# Patient Record
Sex: Female | Born: 1955
Health system: Southern US, Community
[De-identification: ages and names within clinical notes are randomized; demographics above are authoritative.]

## PROBLEM LIST (undated history)

## (undated) DIAGNOSIS — C801 Malignant (primary) neoplasm, unspecified: Secondary | ICD-10-CM

## (undated) DIAGNOSIS — K56609 Unspecified intestinal obstruction, unspecified as to partial versus complete obstruction: Secondary | ICD-10-CM

## (undated) DIAGNOSIS — K5790 Diverticulosis of intestine, part unspecified, without perforation or abscess without bleeding: Secondary | ICD-10-CM

## (undated) DIAGNOSIS — K760 Fatty (change of) liver, not elsewhere classified: Secondary | ICD-10-CM

## (undated) DIAGNOSIS — R740 Nonspecific elevation of levels of transaminase and lactic acid dehydrogenase [LDH]: Secondary | ICD-10-CM

## (undated) DIAGNOSIS — D259 Leiomyoma of uterus, unspecified: Secondary | ICD-10-CM

## (undated) DIAGNOSIS — Z87442 Personal history of urinary calculi: Secondary | ICD-10-CM

## (undated) DIAGNOSIS — E876 Hypokalemia: Secondary | ICD-10-CM

## (undated) DIAGNOSIS — R7401 Elevation of levels of liver transaminase levels: Secondary | ICD-10-CM

## (undated) DIAGNOSIS — R7402 Elevation of levels of lactic acid dehydrogenase (LDH): Secondary | ICD-10-CM

## (undated) DIAGNOSIS — D3A012 Benign carcinoid tumor of the ileum: Secondary | ICD-10-CM

## (undated) DIAGNOSIS — C7B09 Secondary carcinoid tumors of other sites: Secondary | ICD-10-CM

## (undated) HISTORY — DX: Fatty (change of) liver, not elsewhere classified: K76.0

## (undated) HISTORY — DX: Leiomyoma of uterus, unspecified: D25.9

## (undated) HISTORY — PX: OTHER SURGICAL HISTORY: SHX169

## (undated) HISTORY — DX: Secondary carcinoid tumors of other sites: C7B.09

## (undated) HISTORY — DX: Hypokalemia: E87.6

## (undated) HISTORY — DX: Nonspecific elevation of levels of transaminase and lactic acid dehydrogenase (ldh): R74.0

## (undated) HISTORY — DX: Elevation of levels of liver transaminase levels: R74.01

## (undated) HISTORY — DX: Unspecified intestinal obstruction, unspecified as to partial versus complete obstruction: K56.609

## (undated) HISTORY — DX: Benign carcinoid tumor of the ileum: D3A.012

## (undated) HISTORY — DX: Elevation of levels of lactic acid dehydrogenase (LDH): R74.02

## (undated) HISTORY — DX: Malignant (primary) neoplasm, unspecified: C80.1

## (undated) HISTORY — PX: TUBAL LIGATION: SHX77

## (undated) HISTORY — DX: Diverticulosis of intestine, part unspecified, without perforation or abscess without bleeding: K57.90

---

## 1997-08-24 ENCOUNTER — Ambulatory Visit (HOSPITAL_COMMUNITY): Admission: RE | Admit: 1997-08-24 | Discharge: 1997-08-24 | Payer: Self-pay | Admitting: Family Medicine

## 1998-09-18 ENCOUNTER — Encounter: Payer: Self-pay | Admitting: Family Medicine

## 1998-09-18 ENCOUNTER — Ambulatory Visit (HOSPITAL_COMMUNITY): Admission: RE | Admit: 1998-09-18 | Discharge: 1998-09-18 | Payer: Self-pay | Admitting: Family Medicine

## 1999-06-06 ENCOUNTER — Encounter: Admission: RE | Admit: 1999-06-06 | Discharge: 1999-06-06 | Payer: Self-pay | Admitting: Family Medicine

## 1999-06-06 ENCOUNTER — Encounter: Payer: Self-pay | Admitting: Family Medicine

## 1999-08-20 ENCOUNTER — Other Ambulatory Visit: Admission: RE | Admit: 1999-08-20 | Discharge: 1999-08-20 | Payer: Self-pay | Admitting: *Deleted

## 1999-09-19 ENCOUNTER — Ambulatory Visit (HOSPITAL_COMMUNITY): Admission: RE | Admit: 1999-09-19 | Discharge: 1999-09-19 | Payer: Self-pay | Admitting: Family Medicine

## 1999-09-19 ENCOUNTER — Encounter: Payer: Self-pay | Admitting: Family Medicine

## 2000-04-02 ENCOUNTER — Ambulatory Visit (HOSPITAL_COMMUNITY): Admission: RE | Admit: 2000-04-02 | Discharge: 2000-04-02 | Payer: Self-pay | Admitting: *Deleted

## 2000-04-02 ENCOUNTER — Encounter: Payer: Self-pay | Admitting: *Deleted

## 2000-05-16 ENCOUNTER — Encounter (INDEPENDENT_AMBULATORY_CARE_PROVIDER_SITE_OTHER): Payer: Self-pay | Admitting: Specialist

## 2000-05-16 ENCOUNTER — Encounter (INDEPENDENT_AMBULATORY_CARE_PROVIDER_SITE_OTHER): Payer: Self-pay | Admitting: *Deleted

## 2000-05-16 ENCOUNTER — Ambulatory Visit (HOSPITAL_COMMUNITY): Admission: RE | Admit: 2000-05-16 | Discharge: 2000-05-16 | Payer: Self-pay | Admitting: *Deleted

## 2000-08-11 ENCOUNTER — Other Ambulatory Visit: Admission: RE | Admit: 2000-08-11 | Discharge: 2000-08-11 | Payer: Self-pay | Admitting: *Deleted

## 2000-09-30 ENCOUNTER — Encounter: Payer: Self-pay | Admitting: Family Medicine

## 2000-09-30 ENCOUNTER — Ambulatory Visit (HOSPITAL_COMMUNITY): Admission: RE | Admit: 2000-09-30 | Discharge: 2000-09-30 | Payer: Self-pay | Admitting: Family Medicine

## 2001-07-28 ENCOUNTER — Other Ambulatory Visit: Admission: RE | Admit: 2001-07-28 | Discharge: 2001-07-28 | Payer: Self-pay | Admitting: Obstetrics and Gynecology

## 2001-09-07 ENCOUNTER — Encounter: Payer: Self-pay | Admitting: Obstetrics and Gynecology

## 2001-09-07 ENCOUNTER — Encounter: Admission: RE | Admit: 2001-09-07 | Discharge: 2001-09-07 | Payer: Self-pay | Admitting: Obstetrics and Gynecology

## 2001-10-02 ENCOUNTER — Encounter: Payer: Self-pay | Admitting: Family Medicine

## 2001-10-02 ENCOUNTER — Ambulatory Visit (HOSPITAL_COMMUNITY): Admission: RE | Admit: 2001-10-02 | Discharge: 2001-10-02 | Payer: Self-pay | Admitting: Family Medicine

## 2002-09-02 ENCOUNTER — Other Ambulatory Visit: Admission: RE | Admit: 2002-09-02 | Discharge: 2002-09-02 | Payer: Self-pay | Admitting: Obstetrics and Gynecology

## 2002-10-05 ENCOUNTER — Ambulatory Visit (HOSPITAL_COMMUNITY): Admission: RE | Admit: 2002-10-05 | Discharge: 2002-10-05 | Payer: Self-pay | Admitting: Family Medicine

## 2002-10-05 ENCOUNTER — Encounter: Payer: Self-pay | Admitting: Family Medicine

## 2003-09-06 ENCOUNTER — Other Ambulatory Visit: Admission: RE | Admit: 2003-09-06 | Discharge: 2003-09-06 | Payer: Self-pay | Admitting: Obstetrics and Gynecology

## 2003-10-06 ENCOUNTER — Ambulatory Visit (HOSPITAL_COMMUNITY): Admission: RE | Admit: 2003-10-06 | Discharge: 2003-10-06 | Payer: Self-pay | Admitting: Family Medicine

## 2005-06-21 ENCOUNTER — Ambulatory Visit (HOSPITAL_COMMUNITY): Admission: RE | Admit: 2005-06-21 | Discharge: 2005-06-21 | Payer: Self-pay | Admitting: Obstetrics and Gynecology

## 2005-07-15 DIAGNOSIS — K56609 Unspecified intestinal obstruction, unspecified as to partial versus complete obstruction: Secondary | ICD-10-CM

## 2005-07-15 HISTORY — PX: OTHER SURGICAL HISTORY: SHX169

## 2005-07-15 HISTORY — DX: Unspecified intestinal obstruction, unspecified as to partial versus complete obstruction: K56.609

## 2005-07-19 ENCOUNTER — Encounter: Admission: RE | Admit: 2005-07-19 | Discharge: 2005-07-19 | Payer: Self-pay | Admitting: Obstetrics and Gynecology

## 2005-08-16 ENCOUNTER — Encounter (INDEPENDENT_AMBULATORY_CARE_PROVIDER_SITE_OTHER): Payer: Self-pay | Admitting: *Deleted

## 2005-08-16 ENCOUNTER — Inpatient Hospital Stay (HOSPITAL_COMMUNITY): Admission: EM | Admit: 2005-08-16 | Discharge: 2005-08-23 | Payer: Self-pay | Admitting: Emergency Medicine

## 2005-08-17 ENCOUNTER — Encounter (INDEPENDENT_AMBULATORY_CARE_PROVIDER_SITE_OTHER): Payer: Self-pay | Admitting: *Deleted

## 2005-08-17 DIAGNOSIS — D3A012 Benign carcinoid tumor of the ileum: Secondary | ICD-10-CM

## 2005-08-17 HISTORY — DX: Benign carcinoid tumor of the ileum: D3A.012

## 2005-08-20 ENCOUNTER — Ambulatory Visit: Payer: Self-pay | Admitting: Oncology

## 2005-08-22 ENCOUNTER — Encounter (INDEPENDENT_AMBULATORY_CARE_PROVIDER_SITE_OTHER): Payer: Self-pay | Admitting: *Deleted

## 2005-08-23 ENCOUNTER — Encounter (INDEPENDENT_AMBULATORY_CARE_PROVIDER_SITE_OTHER): Payer: Self-pay | Admitting: *Deleted

## 2005-08-27 ENCOUNTER — Ambulatory Visit: Payer: Self-pay | Admitting: Oncology

## 2005-09-30 ENCOUNTER — Encounter (HOSPITAL_COMMUNITY): Admission: RE | Admit: 2005-09-30 | Discharge: 2005-12-29 | Payer: Self-pay | Admitting: Oncology

## 2005-10-01 ENCOUNTER — Encounter (INDEPENDENT_AMBULATORY_CARE_PROVIDER_SITE_OTHER): Payer: Self-pay | Admitting: *Deleted

## 2005-11-15 ENCOUNTER — Ambulatory Visit: Payer: Self-pay | Admitting: Oncology

## 2005-11-19 LAB — CBC WITH DIFFERENTIAL/PLATELET
Basophils Absolute: 0 10*3/uL (ref 0.0–0.1)
Eosinophils Absolute: 0 10*3/uL (ref 0.0–0.5)
HGB: 13.9 g/dL (ref 11.6–15.9)
MCV: 86.3 fL (ref 81.0–101.0)
MONO#: 0.3 10*3/uL (ref 0.1–0.9)
MONO%: 6.9 % (ref 0.0–13.0)
NEUT#: 2.6 10*3/uL (ref 1.5–6.5)
Platelets: 331 10*3/uL (ref 145–400)
RDW: 12.5 % (ref 11.3–14.5)
WBC: 5.1 10*3/uL (ref 3.9–10.0)

## 2005-11-19 LAB — LACTATE DEHYDROGENASE: LDH: 164 U/L (ref 94–250)

## 2005-11-19 LAB — COMPREHENSIVE METABOLIC PANEL
Albumin: 4.5 g/dL (ref 3.5–5.2)
Alkaline Phosphatase: 70 U/L (ref 39–117)
BUN: 10 mg/dL (ref 6–23)
CO2: 28 mEq/L (ref 19–32)
Calcium: 9.8 mg/dL (ref 8.4–10.5)
Glucose, Bld: 102 mg/dL — ABNORMAL HIGH (ref 70–99)
Potassium: 4.2 mEq/L (ref 3.5–5.3)
Total Protein: 7.4 g/dL (ref 6.0–8.3)

## 2006-03-11 ENCOUNTER — Ambulatory Visit: Payer: Self-pay | Admitting: Oncology

## 2006-03-13 LAB — CBC WITH DIFFERENTIAL/PLATELET
BASO%: 0.4 % (ref 0.0–2.0)
EOS%: 0.8 % (ref 0.0–7.0)
MCH: 29.5 pg (ref 26.0–34.0)
MCHC: 34.2 g/dL (ref 32.0–36.0)
RDW: 12.8 % (ref 11.3–14.5)
lymph#: 2.8 10*3/uL (ref 0.9–3.3)

## 2006-03-13 LAB — COMPREHENSIVE METABOLIC PANEL
ALT: 66 U/L — ABNORMAL HIGH (ref 0–40)
AST: 54 U/L — ABNORMAL HIGH (ref 0–37)
Albumin: 4.6 g/dL (ref 3.5–5.2)
Calcium: 9.5 mg/dL (ref 8.4–10.5)
Chloride: 104 mEq/L (ref 96–112)
Creatinine, Ser: 0.82 mg/dL (ref 0.40–1.20)
Potassium: 4.1 mEq/L (ref 3.5–5.3)

## 2006-04-01 ENCOUNTER — Encounter (INDEPENDENT_AMBULATORY_CARE_PROVIDER_SITE_OTHER): Payer: Self-pay | Admitting: *Deleted

## 2006-04-01 ENCOUNTER — Ambulatory Visit (HOSPITAL_COMMUNITY): Admission: RE | Admit: 2006-04-01 | Discharge: 2006-04-01 | Payer: Self-pay | Admitting: Oncology

## 2006-06-10 ENCOUNTER — Ambulatory Visit: Payer: Self-pay | Admitting: Oncology

## 2006-06-12 LAB — CBC WITH DIFFERENTIAL/PLATELET
BASO%: 0.7 % (ref 0.0–2.0)
Eosinophils Absolute: 0.1 10*3/uL (ref 0.0–0.5)
HCT: 43 % (ref 34.8–46.6)
MCHC: 33.8 g/dL (ref 32.0–36.0)
MONO#: 0.5 10*3/uL (ref 0.1–0.9)
NEUT#: 3.4 10*3/uL (ref 1.5–6.5)
NEUT%: 48 % (ref 39.6–76.8)
WBC: 7.1 10*3/uL (ref 3.9–10.0)
lymph#: 3.1 10*3/uL (ref 0.9–3.3)

## 2006-06-12 LAB — LACTATE DEHYDROGENASE: LDH: 194 U/L (ref 94–250)

## 2006-06-12 LAB — COMPREHENSIVE METABOLIC PANEL
ALT: 58 U/L — ABNORMAL HIGH (ref 0–35)
CO2: 29 mEq/L (ref 19–32)
Calcium: 9.6 mg/dL (ref 8.4–10.5)
Chloride: 101 mEq/L (ref 96–112)
Creatinine, Ser: 0.77 mg/dL (ref 0.40–1.20)
Sodium: 141 mEq/L (ref 135–145)
Total Protein: 8.1 g/dL (ref 6.0–8.3)

## 2006-07-29 ENCOUNTER — Ambulatory Visit (HOSPITAL_COMMUNITY): Admission: RE | Admit: 2006-07-29 | Discharge: 2006-07-29 | Payer: Self-pay | Admitting: Obstetrics and Gynecology

## 2006-09-08 ENCOUNTER — Ambulatory Visit: Payer: Self-pay | Admitting: Oncology

## 2006-09-11 ENCOUNTER — Ambulatory Visit (HOSPITAL_COMMUNITY): Admission: RE | Admit: 2006-09-11 | Discharge: 2006-09-11 | Payer: Self-pay | Admitting: Oncology

## 2006-09-11 LAB — CBC WITH DIFFERENTIAL/PLATELET
BASO%: 0.5 % (ref 0.0–2.0)
Eosinophils Absolute: 0 10*3/uL (ref 0.0–0.5)
MCHC: 35 g/dL (ref 32.0–36.0)
MONO#: 0.4 10*3/uL (ref 0.1–0.9)
NEUT#: 2.8 10*3/uL (ref 1.5–6.5)
Platelets: 293 10*3/uL (ref 145–400)
RBC: 4.88 10*6/uL (ref 3.70–5.32)
RDW: 11.9 % (ref 11.3–14.5)
WBC: 6.1 10*3/uL (ref 3.9–10.0)
lymph#: 2.9 10*3/uL (ref 0.9–3.3)

## 2006-09-11 LAB — COMPREHENSIVE METABOLIC PANEL
ALT: 47 U/L — ABNORMAL HIGH (ref 0–35)
AST: 36 U/L (ref 0–37)
Alkaline Phosphatase: 72 U/L (ref 39–117)
CO2: 28 mEq/L (ref 19–32)
Creatinine, Ser: 0.76 mg/dL (ref 0.40–1.20)
Sodium: 139 mEq/L (ref 135–145)
Total Bilirubin: 0.4 mg/dL (ref 0.3–1.2)
Total Protein: 7.4 g/dL (ref 6.0–8.3)

## 2006-12-04 ENCOUNTER — Encounter (INDEPENDENT_AMBULATORY_CARE_PROVIDER_SITE_OTHER): Payer: Self-pay | Admitting: *Deleted

## 2006-12-04 ENCOUNTER — Ambulatory Visit (HOSPITAL_COMMUNITY): Admission: RE | Admit: 2006-12-04 | Discharge: 2006-12-04 | Payer: Self-pay | Admitting: Oncology

## 2006-12-09 ENCOUNTER — Ambulatory Visit: Payer: Self-pay | Admitting: Oncology

## 2006-12-11 LAB — COMPREHENSIVE METABOLIC PANEL
ALT: 44 U/L — ABNORMAL HIGH (ref 0–35)
AST: 34 U/L (ref 0–37)
BUN: 18 mg/dL (ref 6–23)
CO2: 28 mEq/L (ref 19–32)
Calcium: 9.8 mg/dL (ref 8.4–10.5)
Creatinine, Ser: 0.86 mg/dL (ref 0.40–1.20)
Total Bilirubin: 0.4 mg/dL (ref 0.3–1.2)

## 2006-12-11 LAB — CBC WITH DIFFERENTIAL/PLATELET
BASO%: 0.5 % (ref 0.0–2.0)
Basophils Absolute: 0 10*3/uL (ref 0.0–0.1)
EOS%: 1.1 % (ref 0.0–7.0)
HCT: 40.3 % (ref 34.8–46.6)
HGB: 14.1 g/dL (ref 11.6–15.9)
LYMPH%: 44.2 % (ref 14.0–48.0)
MCH: 30.3 pg (ref 26.0–34.0)
MCHC: 34.9 g/dL (ref 32.0–36.0)
MCV: 86.8 fL (ref 81.0–101.0)
NEUT%: 48.2 % (ref 39.6–76.8)
Platelets: 296 10*3/uL (ref 145–400)

## 2006-12-11 LAB — LACTATE DEHYDROGENASE: LDH: 170 U/L (ref 94–250)

## 2007-03-10 ENCOUNTER — Ambulatory Visit: Payer: Self-pay | Admitting: Oncology

## 2007-03-12 LAB — COMPREHENSIVE METABOLIC PANEL
Albumin: 4.5 g/dL (ref 3.5–5.2)
Alkaline Phosphatase: 70 U/L (ref 39–117)
BUN: 14 mg/dL (ref 6–23)
Creatinine, Ser: 0.83 mg/dL (ref 0.40–1.20)
Glucose, Bld: 117 mg/dL — ABNORMAL HIGH (ref 70–99)
Total Bilirubin: 0.4 mg/dL (ref 0.3–1.2)

## 2007-03-12 LAB — CBC WITH DIFFERENTIAL/PLATELET
Basophils Absolute: 0 10*3/uL (ref 0.0–0.1)
EOS%: 1 % (ref 0.0–7.0)
Eosinophils Absolute: 0.1 10*3/uL (ref 0.0–0.5)
HCT: 40 % (ref 34.8–46.6)
HGB: 14 g/dL (ref 11.6–15.9)
MCH: 30.5 pg (ref 26.0–34.0)
NEUT#: 3.4 10*3/uL (ref 1.5–6.5)
NEUT%: 50.4 % (ref 39.6–76.8)
RDW: 12.1 % (ref 11.3–14.5)
lymph#: 2.8 10*3/uL (ref 0.9–3.3)

## 2007-06-16 ENCOUNTER — Ambulatory Visit: Payer: Self-pay | Admitting: Oncology

## 2007-06-18 LAB — COMPREHENSIVE METABOLIC PANEL
Albumin: 4.6 g/dL (ref 3.5–5.2)
CO2: 24 mEq/L (ref 19–32)
Glucose, Bld: 118 mg/dL — ABNORMAL HIGH (ref 70–99)
Potassium: 4.2 mEq/L (ref 3.5–5.3)
Sodium: 139 mEq/L (ref 135–145)
Total Bilirubin: 0.3 mg/dL (ref 0.3–1.2)
Total Protein: 7.8 g/dL (ref 6.0–8.3)

## 2007-06-18 LAB — CBC WITH DIFFERENTIAL/PLATELET
Basophils Absolute: 0 10*3/uL (ref 0.0–0.1)
Eosinophils Absolute: 0.1 10*3/uL (ref 0.0–0.5)
HCT: 39.9 % (ref 34.8–46.6)
HGB: 13.7 g/dL (ref 11.6–15.9)
LYMPH%: 40.8 % (ref 14.0–48.0)
MONO#: 0.3 10*3/uL (ref 0.1–0.9)
NEUT#: 3.7 10*3/uL (ref 1.5–6.5)
Platelets: 367 10*3/uL (ref 145–400)
RBC: 4.65 10*6/uL (ref 3.70–5.32)
WBC: 7 10*3/uL (ref 3.9–10.0)

## 2007-06-18 LAB — LACTATE DEHYDROGENASE: LDH: 181 U/L (ref 94–250)

## 2007-08-04 ENCOUNTER — Ambulatory Visit (HOSPITAL_COMMUNITY): Admission: RE | Admit: 2007-08-04 | Discharge: 2007-08-04 | Payer: Self-pay | Admitting: Obstetrics and Gynecology

## 2007-09-09 ENCOUNTER — Ambulatory Visit: Payer: Self-pay | Admitting: Oncology

## 2007-09-11 LAB — CBC WITH DIFFERENTIAL/PLATELET
Basophils Absolute: 0 10*3/uL (ref 0.0–0.1)
EOS%: 0.7 % (ref 0.0–7.0)
HCT: 39.6 % (ref 34.8–46.6)
HGB: 13.5 g/dL (ref 11.6–15.9)
LYMPH%: 50.5 % — ABNORMAL HIGH (ref 14.0–48.0)
MCH: 28.9 pg (ref 26.0–34.0)
MCV: 84.9 fL (ref 81.0–101.0)
MONO%: 7.2 % (ref 0.0–13.0)
NEUT%: 40.7 % (ref 39.6–76.8)
Platelets: 289 10*3/uL (ref 145–400)
lymph#: 2.8 10*3/uL (ref 0.9–3.3)

## 2007-09-11 LAB — COMPREHENSIVE METABOLIC PANEL
AST: 42 U/L — ABNORMAL HIGH (ref 0–37)
BUN: 16 mg/dL (ref 6–23)
Calcium: 9.2 mg/dL (ref 8.4–10.5)
Chloride: 104 mEq/L (ref 96–112)
Creatinine, Ser: 0.78 mg/dL (ref 0.40–1.20)

## 2007-09-11 LAB — LACTATE DEHYDROGENASE: LDH: 174 U/L (ref 94–250)

## 2007-09-23 ENCOUNTER — Encounter (INDEPENDENT_AMBULATORY_CARE_PROVIDER_SITE_OTHER): Payer: Self-pay | Admitting: *Deleted

## 2007-09-23 ENCOUNTER — Ambulatory Visit (HOSPITAL_COMMUNITY): Admission: RE | Admit: 2007-09-23 | Discharge: 2007-09-23 | Payer: Self-pay | Admitting: Oncology

## 2007-09-28 ENCOUNTER — Ambulatory Visit (HOSPITAL_COMMUNITY): Admission: RE | Admit: 2007-09-28 | Discharge: 2007-09-28 | Payer: Self-pay | Admitting: Oncology

## 2008-01-19 ENCOUNTER — Ambulatory Visit: Payer: Self-pay | Admitting: Oncology

## 2008-01-22 LAB — COMPREHENSIVE METABOLIC PANEL
ALT: 49 U/L — ABNORMAL HIGH (ref 0–35)
AST: 42 U/L — ABNORMAL HIGH (ref 0–37)
Alkaline Phosphatase: 61 U/L (ref 39–117)
Creatinine, Ser: 0.89 mg/dL (ref 0.40–1.20)
Sodium: 140 mEq/L (ref 135–145)
Total Bilirubin: 0.3 mg/dL (ref 0.3–1.2)

## 2008-01-22 LAB — CBC WITH DIFFERENTIAL/PLATELET
BASO%: 0.5 % (ref 0.0–2.0)
EOS%: 0.8 % (ref 0.0–7.0)
HCT: 40.7 % (ref 34.8–46.6)
LYMPH%: 42.5 % (ref 14.0–48.0)
MCH: 29.4 pg (ref 26.0–34.0)
MCHC: 34.1 g/dL (ref 32.0–36.0)
NEUT%: 50.4 % (ref 39.6–76.8)
Platelets: 310 10*3/uL (ref 145–400)
RBC: 4.71 10*6/uL (ref 3.70–5.32)

## 2008-03-29 ENCOUNTER — Other Ambulatory Visit: Admission: RE | Admit: 2008-03-29 | Discharge: 2008-03-29 | Payer: Self-pay | Admitting: Family Medicine

## 2008-04-11 ENCOUNTER — Ambulatory Visit (HOSPITAL_BASED_OUTPATIENT_CLINIC_OR_DEPARTMENT_OTHER): Admission: RE | Admit: 2008-04-11 | Discharge: 2008-04-11 | Payer: Self-pay | Admitting: Family Medicine

## 2008-06-13 ENCOUNTER — Encounter (INDEPENDENT_AMBULATORY_CARE_PROVIDER_SITE_OTHER): Payer: Self-pay | Admitting: *Deleted

## 2008-06-13 ENCOUNTER — Ambulatory Visit (HOSPITAL_COMMUNITY): Admission: RE | Admit: 2008-06-13 | Discharge: 2008-06-13 | Payer: Self-pay | Admitting: Oncology

## 2008-06-14 ENCOUNTER — Ambulatory Visit: Payer: Self-pay | Admitting: Oncology

## 2008-06-16 LAB — COMPREHENSIVE METABOLIC PANEL
Albumin: 4.6 g/dL (ref 3.5–5.2)
BUN: 14 mg/dL (ref 6–23)
Calcium: 9.7 mg/dL (ref 8.4–10.5)
Chloride: 102 mEq/L (ref 96–112)
Glucose, Bld: 99 mg/dL (ref 70–99)
Potassium: 3.8 mEq/L (ref 3.5–5.3)

## 2008-06-16 LAB — CBC WITH DIFFERENTIAL/PLATELET
Basophils Absolute: 0 10*3/uL (ref 0.0–0.1)
Eosinophils Absolute: 0 10*3/uL (ref 0.0–0.5)
HGB: 14 g/dL (ref 11.6–15.9)
MCV: 87.4 fL (ref 81.0–101.0)
NEUT#: 2.5 10*3/uL (ref 1.5–6.5)
RDW: 12.6 % (ref 11.3–14.5)
lymph#: 2.7 10*3/uL (ref 0.9–3.3)

## 2008-08-04 ENCOUNTER — Ambulatory Visit (HOSPITAL_COMMUNITY): Admission: RE | Admit: 2008-08-04 | Discharge: 2008-08-04 | Payer: Self-pay | Admitting: Obstetrics and Gynecology

## 2008-10-27 ENCOUNTER — Ambulatory Visit: Payer: Self-pay | Admitting: Oncology

## 2008-10-31 ENCOUNTER — Encounter: Payer: Self-pay | Admitting: Internal Medicine

## 2008-10-31 LAB — CBC WITH DIFFERENTIAL/PLATELET
BASO%: 0.6 % (ref 0.0–2.0)
Basophils Absolute: 0 10*3/uL (ref 0.0–0.1)
EOS%: 0.7 % (ref 0.0–7.0)
Eosinophils Absolute: 0 10*3/uL (ref 0.0–0.5)
HCT: 41.2 % (ref 34.8–46.6)
HGB: 13.9 g/dL (ref 11.6–15.9)
LYMPH%: 47 % (ref 14.0–49.7)
MCH: 29.4 pg (ref 25.1–34.0)
MCHC: 33.7 g/dL (ref 31.5–36.0)
MCV: 87.1 fL (ref 79.5–101.0)
MONO#: 0.4 10*3/uL (ref 0.1–0.9)
MONO%: 6.5 % (ref 0.0–14.0)
NEUT#: 2.5 10*3/uL (ref 1.5–6.5)
NEUT%: 45.2 % (ref 38.4–76.8)
Platelets: 283 10*3/uL (ref 145–400)
RBC: 4.73 10*6/uL (ref 3.70–5.45)
RDW: 12.5 % (ref 11.2–14.5)
WBC: 5.6 10*3/uL (ref 3.9–10.3)
lymph#: 2.6 10*3/uL (ref 0.9–3.3)

## 2008-10-31 LAB — COMPREHENSIVE METABOLIC PANEL
AST: 48 U/L — ABNORMAL HIGH (ref 0–37)
BUN: 15 mg/dL (ref 6–23)
Calcium: 9.2 mg/dL (ref 8.4–10.5)
Chloride: 103 mEq/L (ref 96–112)
Creatinine, Ser: 0.74 mg/dL (ref 0.40–1.20)
Total Bilirubin: 0.6 mg/dL (ref 0.3–1.2)

## 2008-10-31 LAB — LACTATE DEHYDROGENASE: LDH: 143 U/L (ref 94–250)

## 2008-11-08 ENCOUNTER — Encounter (INDEPENDENT_AMBULATORY_CARE_PROVIDER_SITE_OTHER): Payer: Self-pay | Admitting: *Deleted

## 2008-11-08 ENCOUNTER — Ambulatory Visit (HOSPITAL_COMMUNITY): Admission: RE | Admit: 2008-11-08 | Discharge: 2008-11-08 | Payer: Self-pay | Admitting: Oncology

## 2009-02-23 ENCOUNTER — Ambulatory Visit: Payer: Self-pay | Admitting: Oncology

## 2009-02-27 LAB — COMPREHENSIVE METABOLIC PANEL
Alkaline Phosphatase: 58 U/L (ref 39–117)
CO2: 21 mEq/L (ref 19–32)
Creatinine, Ser: 0.86 mg/dL (ref 0.40–1.20)
Glucose, Bld: 118 mg/dL — ABNORMAL HIGH (ref 70–99)
Total Bilirubin: 0.4 mg/dL (ref 0.3–1.2)

## 2009-02-27 LAB — CBC WITH DIFFERENTIAL/PLATELET
Eosinophils Absolute: 0.1 10*3/uL (ref 0.0–0.5)
HCT: 43.4 % (ref 34.8–46.6)
LYMPH%: 46.3 % (ref 14.0–49.7)
MCHC: 33.8 g/dL (ref 31.5–36.0)
MCV: 87.6 fL (ref 79.5–101.0)
MONO#: 0.3 10*3/uL (ref 0.1–0.9)
MONO%: 7 % (ref 0.0–14.0)
NEUT#: 2.2 10*3/uL (ref 1.5–6.5)
NEUT%: 44.8 % (ref 38.4–76.8)
Platelets: 271 10*3/uL (ref 145–400)
RBC: 4.96 10*6/uL (ref 3.70–5.45)
WBC: 4.9 10*3/uL (ref 3.9–10.3)

## 2009-02-27 LAB — LACTATE DEHYDROGENASE: LDH: 171 U/L (ref 94–250)

## 2009-03-02 ENCOUNTER — Encounter: Payer: Self-pay | Admitting: Internal Medicine

## 2009-03-03 LAB — CREATININE CLEARANCE, URINE, 24 HOUR
Collection Interval-CRCL: 24 hours
Creatinine, 24H Ur: 986 mg/d (ref 700–1800)
Creatinine, Urine: 71.7 mg/dL
Creatinine: 0.88 mg/dL (ref 0.40–1.20)
Urine Total Volume-CRCL: 1375 mL

## 2009-04-20 ENCOUNTER — Encounter: Payer: Self-pay | Admitting: Internal Medicine

## 2009-04-20 DIAGNOSIS — R740 Nonspecific elevation of levels of transaminase and lactic acid dehydrogenase [LDH]: Secondary | ICD-10-CM

## 2009-04-20 DIAGNOSIS — K7689 Other specified diseases of liver: Secondary | ICD-10-CM | POA: Insufficient documentation

## 2009-04-20 DIAGNOSIS — Z8719 Personal history of other diseases of the digestive system: Secondary | ICD-10-CM | POA: Insufficient documentation

## 2009-04-20 DIAGNOSIS — N200 Calculus of kidney: Secondary | ICD-10-CM | POA: Insufficient documentation

## 2009-04-20 DIAGNOSIS — D259 Leiomyoma of uterus, unspecified: Secondary | ICD-10-CM | POA: Insufficient documentation

## 2009-04-20 DIAGNOSIS — D0140 Carcinoma in situ of unspecified part of intestine: Secondary | ICD-10-CM | POA: Insufficient documentation

## 2009-04-20 DIAGNOSIS — R7401 Elevation of levels of liver transaminase levels: Secondary | ICD-10-CM | POA: Insufficient documentation

## 2009-04-26 ENCOUNTER — Ambulatory Visit: Payer: Self-pay | Admitting: Internal Medicine

## 2009-04-26 LAB — CONVERTED CEMR LAB
INR: 1 (ref 0.8–1.0)
Iron: 72 ug/dL (ref 42–145)
Saturation Ratios: 17.4 % — ABNORMAL LOW (ref 20.0–50.0)
Transferrin: 296.4 mg/dL (ref 212.0–360.0)

## 2009-05-08 ENCOUNTER — Ambulatory Visit (HOSPITAL_COMMUNITY): Admission: RE | Admit: 2009-05-08 | Discharge: 2009-05-08 | Payer: Self-pay | Admitting: Internal Medicine

## 2009-05-08 LAB — CONVERTED CEMR LAB
A-1 Antitrypsin, Ser: 113 mg/dL (ref 83–200)
Hepatitis B Surface Ag: NEGATIVE

## 2009-07-27 ENCOUNTER — Ambulatory Visit: Payer: Self-pay | Admitting: Oncology

## 2009-07-31 LAB — CBC WITH DIFFERENTIAL/PLATELET
BASO%: 0.7 % (ref 0.0–2.0)
Eosinophils Absolute: 0 10*3/uL (ref 0.0–0.5)
HCT: 42.2 % (ref 34.8–46.6)
LYMPH%: 40.8 % (ref 14.0–49.7)
MCHC: 33.6 g/dL (ref 31.5–36.0)
MONO#: 0.4 10*3/uL (ref 0.1–0.9)
NEUT#: 3.4 10*3/uL (ref 1.5–6.5)
NEUT%: 52.2 % (ref 38.4–76.8)
Platelets: 275 10*3/uL (ref 145–400)
RBC: 4.72 10*6/uL (ref 3.70–5.45)
WBC: 6.5 10*3/uL (ref 3.9–10.3)
lymph#: 2.6 10*3/uL (ref 0.9–3.3)

## 2009-07-31 LAB — COMPREHENSIVE METABOLIC PANEL
ALT: 66 U/L — ABNORMAL HIGH (ref 0–35)
Albumin: 4.2 g/dL (ref 3.5–5.2)
CO2: 27 mEq/L (ref 19–32)
Calcium: 9.4 mg/dL (ref 8.4–10.5)
Chloride: 103 mEq/L (ref 96–112)
Glucose, Bld: 109 mg/dL — ABNORMAL HIGH (ref 70–99)
Sodium: 138 mEq/L (ref 135–145)
Total Protein: 7.3 g/dL (ref 6.0–8.3)

## 2009-07-31 LAB — LACTATE DEHYDROGENASE: LDH: 171 U/L (ref 94–250)

## 2009-08-07 ENCOUNTER — Ambulatory Visit (HOSPITAL_COMMUNITY): Admission: RE | Admit: 2009-08-07 | Discharge: 2009-08-07 | Payer: Self-pay | Admitting: Obstetrics and Gynecology

## 2010-01-18 ENCOUNTER — Ambulatory Visit: Payer: Self-pay | Admitting: Oncology

## 2010-01-22 LAB — CBC WITH DIFFERENTIAL/PLATELET
Basophils Absolute: 0 10*3/uL (ref 0.0–0.1)
EOS%: 0.7 % (ref 0.0–7.0)
Eosinophils Absolute: 0 10*3/uL (ref 0.0–0.5)
HCT: 43.8 % (ref 34.8–46.6)
HGB: 14.9 g/dL (ref 11.6–15.9)
MCH: 29.9 pg (ref 25.1–34.0)
MONO#: 0.3 10*3/uL (ref 0.1–0.9)
NEUT#: 2.3 10*3/uL (ref 1.5–6.5)
RDW: 12.5 % (ref 11.2–14.5)
WBC: 4.6 10*3/uL (ref 3.9–10.3)
lymph#: 1.9 10*3/uL (ref 0.9–3.3)

## 2010-01-25 LAB — COMPREHENSIVE METABOLIC PANEL
Albumin: 4.6 g/dL (ref 3.5–5.2)
Alkaline Phosphatase: 58 U/L (ref 39–117)
Calcium: 9.8 mg/dL (ref 8.4–10.5)
Chloride: 104 mEq/L (ref 96–112)
Glucose, Bld: 103 mg/dL — ABNORMAL HIGH (ref 70–99)
Potassium: 4 mEq/L (ref 3.5–5.3)
Sodium: 140 mEq/L (ref 135–145)
Total Protein: 7.6 g/dL (ref 6.0–8.3)

## 2010-01-25 LAB — CHROMOGRANIN A: Chromogranin A: 9.8 ng/mL (ref 1.9–15.0)

## 2010-01-26 LAB — CREATININE CLEARANCE, URINE, 24 HOUR
Collection Interval-CRCL: 24 hours
Creatinine Clearance: 105 mL/min (ref 75–115)
Creatinine, 24H Ur: 1319 mg/d (ref 700–1800)
Urine Total Volume-CRCL: 1775 mL

## 2010-03-02 ENCOUNTER — Ambulatory Visit (HOSPITAL_COMMUNITY): Admission: RE | Admit: 2010-03-02 | Discharge: 2010-03-02 | Payer: Self-pay | Admitting: Oncology

## 2010-08-03 ENCOUNTER — Ambulatory Visit: Payer: Self-pay | Admitting: Oncology

## 2010-08-05 ENCOUNTER — Encounter (HOSPITAL_COMMUNITY): Payer: Self-pay | Admitting: Oncology

## 2010-08-05 ENCOUNTER — Encounter: Payer: Self-pay | Admitting: Obstetrics and Gynecology

## 2010-08-07 LAB — CBC WITH DIFFERENTIAL/PLATELET
BASO%: 0.7 % (ref 0.0–2.0)
Eosinophils Absolute: 0.1 10*3/uL (ref 0.0–0.5)
LYMPH%: 44.2 % (ref 14.0–49.7)
MONO#: 0.4 10*3/uL (ref 0.1–0.9)
NEUT#: 2.5 10*3/uL (ref 1.5–6.5)
Platelets: 259 10*3/uL (ref 145–400)
RBC: 4.84 10*6/uL (ref 3.70–5.45)
RDW: 12.4 % (ref 11.2–14.5)
WBC: 5.4 10*3/uL (ref 3.9–10.3)
lymph#: 2.4 10*3/uL (ref 0.9–3.3)

## 2010-08-07 LAB — COMPREHENSIVE METABOLIC PANEL
ALT: 98 U/L — ABNORMAL HIGH (ref 0–35)
Albumin: 3.9 g/dL (ref 3.5–5.2)
CO2: 29 mEq/L (ref 19–32)
Calcium: 9.4 mg/dL (ref 8.4–10.5)
Chloride: 102 mEq/L (ref 96–112)
Glucose, Bld: 97 mg/dL (ref 70–99)
Potassium: 3.8 mEq/L (ref 3.5–5.3)
Sodium: 139 mEq/L (ref 135–145)
Total Protein: 7.5 g/dL (ref 6.0–8.3)

## 2010-08-07 LAB — LACTATE DEHYDROGENASE: LDH: 183 U/L (ref 94–250)

## 2010-08-10 LAB — CHROMOGRANIN A

## 2010-08-23 LAB — 5 HIAA, QUANTITATIVE, URINE, 24 HOUR
5-HIAA, 24 Hr Urine: 9.6 mg/24 h — ABNORMAL HIGH (ref <=6.0)
Volume, Urine-5HIAA: 1000 mL/24 h

## 2010-08-23 LAB — CREATININE CLEARANCE, URINE, 24 HOUR: Creatinine: 0.81 mg/dL (ref 0.40–1.20)

## 2010-08-24 ENCOUNTER — Other Ambulatory Visit (HOSPITAL_COMMUNITY): Payer: Self-pay | Admitting: Oncology

## 2010-08-24 DIAGNOSIS — D3A00 Benign carcinoid tumor of unspecified site: Secondary | ICD-10-CM

## 2010-08-29 ENCOUNTER — Ambulatory Visit (HOSPITAL_COMMUNITY): Payer: Self-pay

## 2010-08-30 ENCOUNTER — Encounter (HOSPITAL_COMMUNITY): Payer: Self-pay

## 2010-08-31 ENCOUNTER — Encounter (HOSPITAL_COMMUNITY): Payer: Self-pay

## 2010-09-05 ENCOUNTER — Encounter (HOSPITAL_COMMUNITY)
Admission: RE | Admit: 2010-09-05 | Discharge: 2010-09-05 | Disposition: A | Discharge status: Home / Self Care | Payer: 59 | Source: Ambulatory Visit | Attending: Oncology | Admitting: Oncology

## 2010-09-05 DIAGNOSIS — N859 Noninflammatory disorder of uterus, unspecified: Secondary | ICD-10-CM | POA: Insufficient documentation

## 2010-09-05 DIAGNOSIS — R978 Other abnormal tumor markers: Secondary | ICD-10-CM | POA: Insufficient documentation

## 2010-09-05 DIAGNOSIS — D3A00 Benign carcinoid tumor of unspecified site: Secondary | ICD-10-CM

## 2010-09-05 DIAGNOSIS — C7A012 Malignant carcinoid tumor of the ileum: Secondary | ICD-10-CM | POA: Insufficient documentation

## 2010-09-06 ENCOUNTER — Encounter (HOSPITAL_COMMUNITY): Payer: Self-pay

## 2010-09-06 ENCOUNTER — Ambulatory Visit (HOSPITAL_COMMUNITY)
Admission: RE | Admit: 2010-09-06 | Discharge: 2010-09-06 | Disposition: A | Discharge status: Home / Self Care | Payer: 59 | Source: Ambulatory Visit | Attending: Oncology | Admitting: Oncology

## 2010-09-06 DIAGNOSIS — R935 Abnormal findings on diagnostic imaging of other abdominal regions, including retroperitoneum: Secondary | ICD-10-CM | POA: Insufficient documentation

## 2010-09-06 MED ORDER — INDIUM IN-111 PENTETREOTIDE IV KIT: 5.5000 | PACK | Freq: Once | INTRAVENOUS | Status: AC | PRN

## 2010-09-07 ENCOUNTER — Ambulatory Visit (HOSPITAL_COMMUNITY): Payer: Self-pay

## 2010-09-21 ENCOUNTER — Other Ambulatory Visit (HOSPITAL_COMMUNITY): Payer: Self-pay | Admitting: Oncology

## 2010-09-21 DIAGNOSIS — D3A00 Benign carcinoid tumor of unspecified site: Secondary | ICD-10-CM

## 2010-10-01 ENCOUNTER — Other Ambulatory Visit (HOSPITAL_COMMUNITY): Payer: Self-pay | Admitting: Oncology

## 2010-10-01 ENCOUNTER — Ambulatory Visit (HOSPITAL_COMMUNITY)
Admission: RE | Admit: 2010-10-01 | Discharge: 2010-10-01 | Disposition: A | Discharge status: Home / Self Care | Payer: 59 | Source: Ambulatory Visit | Attending: Oncology | Admitting: Oncology

## 2010-10-01 ENCOUNTER — Encounter (HOSPITAL_BASED_OUTPATIENT_CLINIC_OR_DEPARTMENT_OTHER): Payer: 59 | Admitting: Oncology

## 2010-10-01 DIAGNOSIS — N859 Noninflammatory disorder of uterus, unspecified: Secondary | ICD-10-CM | POA: Insufficient documentation

## 2010-10-01 DIAGNOSIS — Z859 Personal history of malignant neoplasm, unspecified: Secondary | ICD-10-CM | POA: Insufficient documentation

## 2010-10-01 DIAGNOSIS — C7A012 Malignant carcinoid tumor of the ileum: Secondary | ICD-10-CM

## 2010-10-01 DIAGNOSIS — D3A00 Benign carcinoid tumor of unspecified site: Secondary | ICD-10-CM

## 2010-10-01 DIAGNOSIS — N83209 Unspecified ovarian cyst, unspecified side: Secondary | ICD-10-CM | POA: Insufficient documentation

## 2010-10-01 LAB — CBC WITH DIFFERENTIAL/PLATELET
BASO%: 1.1 % (ref 0.0–2.0)
EOS%: 1.1 % (ref 0.0–7.0)
HGB: 14.8 g/dL (ref 11.6–15.9)
MCH: 29 pg (ref 25.1–34.0)
MCHC: 32.8 g/dL (ref 31.5–36.0)
RDW: 12.3 % (ref 11.2–14.5)
lymph#: 2.5 10*3/uL (ref 0.9–3.3)

## 2010-10-01 LAB — CMP (CANCER CENTER ONLY)
AST: 80 U/L — ABNORMAL HIGH (ref 11–38)
Albumin: 3.7 g/dL (ref 3.3–5.5)
Alkaline Phosphatase: 64 U/L (ref 26–84)
Potassium: 4.1 mEq/L (ref 3.3–4.7)
Sodium: 137 mEq/L (ref 128–145)
Total Protein: 7.6 g/dL (ref 6.4–8.1)

## 2010-10-01 MED ORDER — GADOBENATE DIMEGLUMINE 529 MG/ML IV SOLN
15.0000 mL | Freq: Once | INTRAVENOUS | Status: AC | PRN
  Administered 2010-10-01: 15 mL via INTRAVENOUS

## 2010-10-05 LAB — CHROMOGRANIN A: Chromogranin A: 8.6 ng/mL (ref 1.9–15.0)

## 2010-10-05 LAB — LACTATE DEHYDROGENASE: LDH: 222 U/L (ref 94–250)

## 2010-10-30 ENCOUNTER — Other Ambulatory Visit: Payer: Self-pay | Admitting: Oncology

## 2010-10-30 ENCOUNTER — Other Ambulatory Visit (HOSPITAL_COMMUNITY): Payer: Self-pay | Admitting: Oncology

## 2010-10-30 DIAGNOSIS — Z1231 Encounter for screening mammogram for malignant neoplasm of breast: Secondary | ICD-10-CM

## 2010-11-30 NOTE — Discharge Summary (Signed)
NAMEKHANH, CORDNER              ACCOUNT NO.:  [id number]   MEDICAL RECORD NO.:  [id number]          PATIENT TYPE:  INP   LOCATION:  5713                         FACILITY:  [facility]   PHYSICIAN:  Cherylynn Ridges, M.D.    DATE OF BIRTH:  [birth year]   DATE OF ADMISSION:  08/16/2005  DATE OF DISCHARGE:  08/23/2005                                 DISCHARGE SUMMARY   CHIEF COMPLAINT AND REASON FOR ADMISSION:  [name] is a 55 year old  female patient with longstanding history of postprandial bloating, abdominal  pain and heme-positive stool since 1998.  Has had multiple workups that have  been unrevealing of etiology of these problems.  She presented to the ER on  the date of admission with intractable nausea and vomiting for the past  three hours with abdominal pain.  A CT of the abdomen and pelvis was  obtained and did demonstrate a partial small bowel obstruction with a soft  tissue density suspicious for carcinoid in the mesentery.  Based on these  findings, the patient was admitted for further evaluation and treatment and  anticipated operative intervention.   On admission she was afebrile and vital signs were stable.  She was mildly  tachycardic at pulse rate of 105.  White count was somewhat elevated at  15,100, hemoglobin was 15.5, neutrophils 96%.  Sodium 138, potassium 4.1,  BUN 12, creatinine 0.8.  LFTs as follows:  ALT 56, AST 47, with a normal  total bilirubin.   ADMISSION DIAGNOSES:  1.  Partial small bowel obstruction with associated nausea, vomiting and      abdominal pain.  2.  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  3.  Leukocytosis with left shift.  4.  Mild transaminitis with CT evidence of fatty infiltration of the liver.   HOSPITAL COURSE:  The patient was admitted to the medical floor, where she  was started on IV fluid hydration.  She was empirically started on Cipro and  Flagyl due to leukocytosis, and plans were to reevaluate her in the morning  for  possible operative intervention.   On August 17, 2005, the patient was taken to the OR by Dr. Carolynne Edouard, where she  underwent exploratory laparotomy and segmental small bowel resection with  resection of a mesenteric tumor.  In the immediate postoperative period, the  patient did well.  Pain was managed on IV Dilaudid.  She was continued on IV  fluids and an NG tube was in place with green drainage.   Subsequent pathology was positive for stage III carcinoid with two lymph  nodes positive.  She was then evaluated by Dr. Arline Asp with oncology, who  noted that there was no effective adjunctive therapy available and he was  doubtful that there were any protocols for investigational therapy.  Plan on  evaluating this patient in three to four weeks at the cancer center.   Although from a surgical standpoint regarding her abdomen, the patient did  well postoperatively, she was noted to have some mild hypoxemia and  continued to require oxygen.  A chest x-ray was done on August 20, 2005,  that demonstrated a left lower lobe and lingular opacities consistent with  pneumonia.  The patient was started on Avelox IV.  Within the next 24 hours,  the patient was ambulating.  Her NG tube had been discontinued.  She was  started on clear liquids.  Because of the diagnosis of carcinoid,  surveillance CT of the head and chest were obtained.  Head CT was within  normal limits.  The CT of the chest did reveal moderate-sized bilateral  pleural effusions and atelectasis and consolidation with multifocal  bilateral air space disease and because of this, on August 22, 2005, the  patient was given Lasix 80 mg IV x1 dosage and subsequently was weaned off  of oxygen.  She was mildly short of breath at the end of her ambulation  activity, i.e., the patient would walk from her room at the end of the hall  down to the other end of the hall and back and would become short of breath  but was not hypoxic.  On the morning  of discharge, her potassium was low at  2.9.  She was given 40 mEq p.o. x2 and a repeat chest x-ray is pending at  time of dictation.  Important to note that the patient has been using her  incentive spirometer as instructed since postop day 1 and her pain is well-  controlled with p.o. Vicodin.  She is also tolerating her diet and has had a  bowel movement prior to discharge.   FINAL DISCHARGE DIAGNOSES:  1.  Abdominal pain, nausea and vomiting secondary to small bowel obstruction      due to small bowel tumor.  2.  Status post resection of small bowel tumor, positive for stage III      carcinoid with two lymph nodes positive.  3.  Bilateral pneumonia on chest x-ray and CT scan, on Avelox.  4.  Bilateral moderate-sized pleural effusions on CT scan, repeat chest x-      ray pending.  5.  Hypokalemia, orally repleted on date of discharge.   DISCHARGE MEDICATIONS:  1.  Avelox 400 mg daily x8 more days.  2.  Vicodin 5/500 mg one to two every four to six hours as needed for pain.   DIET:  As previous.   ACTIVITY:  The patient is to remain out of work until followed up with Dr.  Carolynne Edouard.  Expect to be out at least six weeks from date of surgery.  No driving  while taking the Vicodin.  No lifting for the next six weeks.   WOUND CARE:  May use soap and water over staple line, pat dry.   Patient is to follow up with Dr. Carolynne Edouard in the office on February 13 at 9 a.m.  She is to follow up with Dr. Arline Asp as previously instructed.   ADDITIONAL INSTRUCTIONS:  The patient has been instructed to continue to use  her incentive spirometer at home.      Allison L. Rennis Harding, N.P.      Cherylynn Ridges, M.D.  Electronically Signed    ALE/MEDQ  D:  08/23/2005  T:  08/23/2005  Job:  [id number]

## 2010-11-30 NOTE — Consult Note (Signed)
NAMECOLLEN, Vanessa Waller              ACCOUNT NO.:  [id number]   MEDICAL RECORD NO.:  [id number]          PATIENT TYPE:  INP   LOCATION:  5713                         FACILITY:  [facility]   PHYSICIAN:  Samul Dada, M.D.DATE OF BIRTH:  1955-08-25   DATE OF CONSULTATION:  08/22/2005  DATE OF DISCHARGE:                                   CONSULTATION   CONSULTING PHYSICIAN:  Samul Dada, M.D.   REASON FOR CONSULTATION:  Carcinoid.   REFERRING PHYSICIAN:  Dr. Lindie Spruce.   HISTORY OF PRESENT ILLNESS:  Vanessa Waller is a 55 year old white female with  a longstanding history of progressive abdominal distention, who presented to  the emergency department with significant vomiting and constant nausea for  at least 24 hours.  A CT of the abdomen was consistent with small bowel  obstruction with soft tissue density of 2.1 x 1.9-cm, ill-defined, in the  mesentery.  No lymphadenopathy.  She also had fatty infiltration of the  liver.  On August 17, 2005, she underwent exploratory laparoscopy,  segmental bowel resection, and resection of a mesenteric tumor by Dr. Carolynne Edouard.  Pathology, case #[id number] Dr. Sherryll Burger, revealed carcinoid, 2-cm, negative  margins, and the distance to the closest margin being 5-cm radial.  The  tumor extends through the intestinal wall to involve the serosa.  She also  has vascular/lymphatic invasion.  __________ invasion is seen.  Of 13 lymph  nodes, two were positive.  She is TX N1 MX.  We were asked to see the  patient with recommendations regarding her care.   PAST MEDICAL HISTORY:  1.  Carcinoid as above.  2.  Prior history of NSAID-induced gastric antral ulcer.  3.  Possible pneumonia during this admission.   SURGERY:  1.  Status post segmental small bowel resection with resection of mesenteric      tumor on August 17, 2005.  2.  Status post bilateral tubal ligation, 1994.   ALLERGIES:  AMOXICILLIN, causes rash.   CURRENT MEDICATIONS:  1.  Morphine sulfate PCA.  2.  Avelox 400 mg IV every 24 hours.  3.  Protonix 40 mg every day.  4.  Scopolamine TD every 72 hours.  5.  Phenergan, Benadryl, Reglan, __________  , Zofran, and Compazine p.r.n.   REVIEW OF SYSTEMS:  Remarkable for intermittent fever and chills, fatigue,  dyspnea on exertion, nausea and vomiting, as above.  She also has been  experiencing what she calls bloating for at least 10 years.  Her last  menstrual period was about two years ago.  Her last bowel movement was the  Friday, prior to the surgery which is about six days ago.   FAMILY HISTORY:  Mother alive with diabetes.  Father died with CHF.  Two  sisters in good health.  Two brothers in good health.   SOCIAL HISTORY:  The patient is single, no children.  She is an Personal assistant.  No tobacco or alcohol history.  She lives in [place].  [employer].   Her last Pap exam was in 2005, normal.  She is followed at [facility].  Her last colonoscopy  was in 2001, Dr. Virginia Rochester.  Last mammogram in January 2007,  negative.   PHYSICAL EXAMINATION:  GENERAL:  This is a pale, ill-appearing, 55 year old  white female in no acute distress, alert and oriented x3.  VITAL SIGNS:  Blood pressure 152/77, pulse 119, respirations 20, temperature  98.7, pulse oximetry 96% on 2 liters, weight 135 pounds, height 65 inches.  HEENT:  Normocephalic/atraumatic.  PERRLA.  Oral mucosa without thrush.  No  lesions.  There is a NG tube in place.  NECK:  Supple.  No cervical or supraclavicular masses.  LUNGS:  With decreased breath sounds bibasilar, especially on the left and  rales at the right base.  No axillary masses.  Base without masses.  CARDIOVASCULAR:  Regular rate and rhythm.  No murmurs, rubs, or gallops.  ABDOMEN:  Slightly distended with postoperative changes.  Decreased bowel  sounds.  Slightly tender to palpation.  The liver and the spleen can not be  palpated.  This is a limited exam due to the distention.  GU:  Deferred.  RECTAL:   Deferred.  EXTREMITIES:  With no clubbing or cyanosis.  No edema.  She has PA hose in  place.  SKIN:  Without lesions or significant bruising.  No petechial rash.  NEUROLOGIC:  Nonfocal.   LABORATORY:  Hemoglobin 11.8, hematocrit 34.3 with 15.5 and 45.2  respectively in admission.  White count 10.4, platelets 223, and neutrophils  8.5, MCV 88.  LDH 2285.  CEA 0.5.  Sodium 142, potassium 3.8, BUN 7,  creatinine 0.8, glucose 94.  Total bilirubin 0.5, alkaline phosphatase 50,  AST 29, ALT 41, total protein 6.0, albumin 3.3, calcium 8.5.   ASSESSMENT/PLAN:  Dr. Arline Asp has seen and evaluated the patient and the  chart has been reviewed.  He met with the patient, sister Vanessa Waller, niece Vanessa Waller, and friend - Vanessa Waller.  The discussion lasted about one  hour.  The patient has positive lymph nodes, stage III carcinoid of distal  small intestine presenting with intermittent symptoms, questionable volvulus  or intussusception since 1998.  A workup which included a CT in 2001,  apparently was negative.  No residual disease remaining.  Generous margins.  No known effective adjuvant therapy is available,  and Dr. Arline Asp is doubtful that there are any protocols for  investigational therapy.  We will plan to see the patient in three to four  weeks at the [facility] for further discussion of her disease.   Thank you very much for allowing [place] the opportunity to participate in the  care of Vanessa Waller.      Marlowe Kays, P.A.      Samul Dada, M.D.  Electronically Signed    SW/MEDQ  D:  08/23/2005  T:  08/23/2005  Job:  [id number]   cc:   [facility]

## 2010-11-30 NOTE — Procedures (Signed)
[facility]  Patient:    TAYLORANN, TKACH                     MRN: [id number] Proc. Date: 05/16/00 Adm. Date:  [id number] Attending:  Sabino Gasser CC:         Tammy R. Collins Scotland, M.D.   Procedure Report  PROCEDURE:  Colonoscopy.  INDICATION:  Hemoccult positivity.  ANESTHESIA:  Additional Demerol 10 mg, Versed 1 mg.  DESCRIPTION OF PROCEDURE:  With patient mildly sedated in the left lateral decubitus position, the Olympus videoscopic colonoscope was inserted in the rectum and advanced under direct vision to the cecum.  Cecum identified by the ileocecal valve and appendiceal orifice, both of which were photographed. From this point, the colonoscope was slowly withdrawn, taking circumferential views of the entire colonic mucosa until the endoscope had been pulled back into the rectum, placed in retroflexion to view the anal canal from above. This appeared normal and was photographed.  The endoscope was straightened and withdrawn.  The patients vital signs and pulse oximetry remained stable.  The patient tolerated the procedure well without apparent complications.  FINDINGS:  Essentially normal colonoscopic examination to the cecum.  PLAN:  Follow up as described with endoscopy note previously. DD:  05/16/00 TD:  05/16/00 Job: [id number]

## 2010-11-30 NOTE — Procedures (Signed)
[facility]  Patient:    Vanessa Waller, Vanessa Waller                     MRN: [id number] Proc. Date: 05/16/00 Adm. Date:  [id number] Attending:  Sabino Gasser CC:         Tammy R. Collins Scotland, M.D.   Procedure Report  PROCEDURE:  Upper endoscopy with biopsy.  SURGEON:  Sabino Gasser, M.D.  INDICATIONS:  Abdominal pain, abdominal bloating, and heme positive stools.  ANESTHESIA:  Demerol 60 mg and Versed 6 mg.  DESCRIPTION OF PROCEDURE:  With the patient mildly sedated in the left lateral decubitus position, the Olympus videoscopic endoscope was inserted in the mouth and passed under direct vision through the esophagus which appeared normal into the stomach.  The fundus and body appeared normal.  The antrum was approached and was edematous, erythematous, and there was a linear ulceration seen with overlying clot and a whitish base.  This was photographed and then biopsied.  We went down through the pylorus that was just distal to this.  The duodenal bulb and second portion of the duodenum were both normal.  The endoscope was then withdrawn, taking circumferential views of the entire duodenal mucosa until the endoscope had been pulled back in the stomach, and placed on retroflexion to view the stomach from below, and this too appeared normal.  The endoscope was straightened and withdrawn, taking circumferential views of the remaining gastric and esophageal mucosa which otherwise appeared normal.  The patients vital signs and pulse oximeter remained stable.  The patient tolerated the procedure well without apparent complications.  FINDINGS:  Ulceration of distal antrum, biopsied.  Await biopsy report.  The patient will call me for results and follow up with me as an outpatient and with Dr. Collins Scotland.  Proceed to colonoscopy. DD:  05/16/00 TD:  05/16/00 Job: [id number]

## 2010-11-30 NOTE — H&P (Signed)
NAMERITTA, HAMMES              ACCOUNT NO.:  [id number]   MEDICAL RECORD NO.:  [id number]          PATIENT TYPE:  INP   LOCATION:  1827                         FACILITY:  [facility]   PHYSICIAN:  Vanessa Waller, M.D.   DATE OF BIRTH:  January 24, 1956   DATE OF ADMISSION:  08/16/2005  DATE OF DISCHARGE:                                HISTORY & PHYSICAL   CHIEF COMPLAINT:  Abdominal pain and intractable nausea and vomiting.   HISTORY OF PRESENT ILLNESS:  [name] is a 55 year old female patient  with a longstanding history of postprandial bloating, abdominal pain, and  heme positive stools since 1998. In 2001 she underwent a GI evaluation with  a normal colonoscopy and EGD demonstrating NSAID-induced gastric antral  ulcer. She relates essentially chronic problems related to postprandial  problems, usually after the p.m. meal. She would awaken about 2 a.m.  periodically with significant periumbilical pain that was usually associated  with nausea or vomiting and with the need to have an urgent bowel movement.  For the past several months she has noticed increased frequency of  occurrence and increased duration of symptomatology. Initially these  episodes would occur only once or twice per month and usually last about 30  minutes. Now she has noticed increased frequency of these spells with a  duration of at least 2 hours or longer and it takes her at least 1-2 days to  recover due to the blahs and feeling extremely fatigued and weak. She  presented to the ER today because she developed intractable nausea and  vomiting, at least six episodes of emesis in the past 3 hours prior to  arrival. In the ER, CT of the abdomen and pelvis was obtained. This did  demonstrate a partial small-bowel obstruction with a soft tissue density  measuring 2.1 x 1.9 cm that was presumed to be an ill-defined mass in the  mesentery and correlating with the area of small-bowel obstruction over the  periumbilical  area. There was no increased size in the abdominal or  mesenteric lymph nodes. Her white count was elevated at 15,100. She was  afebrile. Her LFTs are mildly elevated with a normal total bilirubin and  alkaline phosphatase. Her CT of the abdomen also demonstrates heavily fatty  infiltrated liver. Lipase was normal at 33.   REVIEW OF SYSTEMS:  The patient in the past 24 hours describes a subjective  sensation of fever and chills. She has had no chest pain, shortness of  breath, or dyspnea on exertion, no blood in her stools or urine, no dysuria.  She last ate about 6:30 p.m. yesterday evening, states she feels hungry now  and feels like she has a sugar-related headache. She had a small but normal  color and shaped BM this morning. She has not had any diarrhea, loose or  watery stools.   FAMILY MEDICAL HISTORY:  Her father has skin cancer, congestive heart  failure, dyslipidemia, and hypothyroidism. Mother with dyslipidemia,  hypothyroidism, and diabetes. Sister with hypothyroidism.   SOCIAL HISTORY:  She does not smoke. She rarely drinks alcohol once or twice  a year.  She works in an office with computers. She is single.   PAST MEDICAL HISTORY:  She is postmenopausal. She is not on hormone  replacement therapy.   PAST SURGICAL HISTORY:  She had a tubal ligation in 1994.   ALLERGIES:  AMOXICILLIN which caused a rash.   CURRENT MEDICATIONS:  Over-the-counter Tums for GI symptoms as well as over-  the-counter Zantac.   PHYSICAL EXAMINATION:  GENERAL:  This is a pleasant female patient,  currently complaining of mild periumbilical pain and abdominal distention.  VITAL SIGNS:  Temperature 97.4, blood pressure 116/83, pulse rate 105,  respirations 16.  HEENT:  Normocephalic. Sclerae noninjected, nonicteric. No cervical  adenopathy noted.  NEUROLOGIC:  Alert and oriented x3, moving all extremities x4. No focal  deficits.  CHEST:  Bilateral lung fields are clear to auscultation.  Respiratory effort  is nonlabored. She is on room air saturating 98-100% in the ER.  HEART:  S1, S2. No rubs, murmurs, thrills. No gallops, no JVD. Carotids 2+  bilaterally. Pulse is regular and tachycardic.  ABDOMEN:  Soft, slightly distended. I was unable to auscultate any bowel  sounds. She is tender in the umbilical region with guarding and questionable  rebounding. She states it hurts worse after palpation when I let up than  when I mash in. There is no umbilical hernia. There is a faint lower  umbilical scar from prior tubal ligation. No obvious masses noted and no  obvious hepatosplenomegaly.  EXTREMITIES:  Symmetrical in appearance without edema, cyanosis, or  clubbing. Pulses are palpable.   LABORATORY DATA:  Urinalysis is negative for any acute bacterial process.  Wbc's are 15,100; hemoglobin slightly elevated at 15.5; platelets are  297,000; neutrophils are 96%; lymphocytes 4%. Sodium 138, potassium 4.1, CO2  26, glucose 120, BUN 12, creatinine 0.8, ALT 56, AST 47, total bilirubin and  alkaline phosphatase is normal.   DIAGNOSTICS:  A CT of the abdomen and pelvis shows a partial small-bowel  obstruction. Dilatation begins at the level of the umbilicus. There is  either a mass or an ill-defined tissue density within the mesentery that  appears to correlate with the site of the small-bowel obstruction.  They are  questioning a carcinoid process. This area measures 2.1 x 1.9 cm. There is  also noted to be diffuse fatty infiltration of the liver.   IMPRESSION:  1.  Partial small-bowel obstruction with associated nausea, vomiting, and      abdominal pain.  2.  Possible carcinoid tumor in the mesentery, further evaluation indicated.  3.  Leukocytosis with left shift.  4.  Small transaminitis with CT evidence of fatty infiltration of the liver.  5.  Positive family medical history of dyslipidemia and hypothyroidism.   PLAN: 1.  Admit to the floor. Vanessa Wallero. status for bowel rest  due to small-bowel      obstruction. IV fluid hydration at 150 an hour. Potassium in the IV      fluids. Dilaudid for pain. Zofran and Phenergan for nausea. Because of      leukocytosis, empiric Cipro and Flagyl IV. We will also start Protonix      40 mg IV for reflux symptoms.  2.  Because of the fatty infiltration of the liver and the history of      hypothyroidism as well as GI complaints, will check a TSH and a fasting      lipid panel.  3.  Dr. Orson Slick will interview the patient and review the CT scans to  determine if further surgical intervention is indicated. Otherwise, we      will proceed with trying to resolve the bowel obstruction with bowel      rest.     Vanessa Waller, N.P.      Vanessa Waller, M.D.  Electronically Signed   ALE/MEDQ  D:  08/16/2005  T:  08/16/2005  Job:  [id number]

## 2010-11-30 NOTE — Op Note (Signed)
NAMEEDLYN, ROSENBURG              ACCOUNT NO.:  [id number]   MEDICAL RECORD NO.:  [id number]          PATIENT TYPE:  INP   LOCATION:  5713                         FACILITY:  [facility]   PHYSICIAN:  [name]. [name], M.D. DATE OF BIRTH:  [birth year]   DATE OF PROCEDURE:  08/17/2005  DATE OF DISCHARGE:                                 OPERATIVE REPORT   PREOPERATIVE DIAGNOSIS:  Partial small-bowel obstruction with tumor in the  mesentery of the small bowel.   POSTOP DIAGNOSIS:  Partial small-bowel obstruction with tumor in the  mesentery of the small bowel.   PROCEDURES:  Exploratory laparotomy, segmental small bowel resection with  resection of the mesenteric tumor.   SURGEON:  Dr. Carolynne Edouard.   ASSISTANT:  Dr. Corliss Skains.   ANESTHESIA:  General endotracheal.   PROCEDURE:  After informed consent was obtained, the patient was brought to  the operating room and placed in supine position on the operating table.  After adequate induction of general anesthesia, the patient's abdomen was  prepped with Betadine and draped in usual sterile manner. A lower midline  incision was made with 10 blade knife. This incision was carried down  through the skin and subcutaneous tissue sharply with electrocautery until  the linea alba was identified.  The linea alba was also incised with  electrocautery. The preperitoneal space was probed bluntly with a hemostat  until the peritoneum was opened and access was gained to the abdominal  cavity. The rest of the incision was then opened under direct vision with  electrocautery.  The omentum was reflected superiorly and the ligament of  Treitz was identified. The small bowel was then run from the ligament of  Treitz to the ileocecal valve.  In the distal small bowel, a constricting  lesion was identified that also had some mass effect in the mesentery.  The  bowel did not appear to be completely obstructed at this point.  There were  also what appeared to be some  enlarged lymph nodes in the mesentery. Sites  at this point were then chosen above and below the area of tumor for  division of the small bowel. The sites were chosen so that they could  encompass the enlarged lymph nodes and mass within the mesentery the  mesentery at each of these sites was opened sharply with electrocautery. A  GIA 55 stapler was placed across the bowel at each of these points, clamped  and fired thereby dividing the bowel between staple lines. Next the  mesentery corresponding to this portion of the small intestine that had the  mass in it was taken down by clamping each of the vessels in the mesentery  below the lymph nodes and mass with Kelly clamps, dividing and ligating  these vessels with 2-0 silk ties and or 2-0 silk suture ligatures. Once this  was accomplished and the small bowel mass and mesentery was completely freed  from the patient and it was removed and sent to pathology for further  evaluation, no other lesions in the intestines were identified. The proximal  and distal segment of bowel was then  readily approximated with 3-0 silk  stitches. The distal antimesenteric end of the staple line and each of the  two segments of small bowel was then removed sharply with the curved Mayos.  Each limb of a GIA 55 stapler was then placed down the according limb of the  intestine through the opening, clamped and fired thereby creating a nice  widely patent enteroenterostomy.  The enterotomy was then closed with  interrupted 3-0 silk stitches. The mesentery was closed with a combination  of figure-of-eight 3-0 and 2-0 silk stitches. Once this was accomplished,  the bowel appeared to be very healthy and widely patent.  The rest of the  abdomen was explored and no other lesions were identified. The liver was  palpated and felt normal. The abdomen was then irrigated with copious  amounts of saline. The position of the NG tube was confirmed in the stomach  in good  position. At this point the fascia of the anterior abdominal wall  was closed with two running #1 PDS sutures. The subcutaneous tissue was  irrigated with copious  amounts of saline and Betadine and the skin was closed with staples. Sterile  dressings were then applied. The patient tolerated well. At the end of the  case, all needle, sponge and instrument counts were correct. The patient was  then awakened and taken recovery in stable condition.      [name]. [name], M.D.  Electronically Signed     PST/MEDQ  D:  08/18/2005  T:  08/18/2005  Job:  [id number]

## 2010-12-03 ENCOUNTER — Ambulatory Visit (HOSPITAL_COMMUNITY)
Admission: RE | Admit: 2010-12-03 | Discharge: 2010-12-03 | Disposition: A | Discharge status: Home / Self Care | Payer: 59 | Source: Ambulatory Visit | Attending: Oncology | Admitting: Oncology

## 2010-12-03 DIAGNOSIS — Z1231 Encounter for screening mammogram for malignant neoplasm of breast: Secondary | ICD-10-CM

## 2011-02-06 ENCOUNTER — Other Ambulatory Visit (HOSPITAL_COMMUNITY): Payer: Self-pay | Admitting: Oncology

## 2011-02-06 ENCOUNTER — Encounter (HOSPITAL_BASED_OUTPATIENT_CLINIC_OR_DEPARTMENT_OTHER): Payer: 59 | Admitting: Oncology

## 2011-02-06 DIAGNOSIS — C7A012 Malignant carcinoid tumor of the ileum: Secondary | ICD-10-CM

## 2011-02-06 DIAGNOSIS — C172 Malignant neoplasm of ileum: Secondary | ICD-10-CM

## 2011-02-06 LAB — CBC WITH DIFFERENTIAL/PLATELET
Basophils Absolute: 0.1 10*3/uL (ref 0.0–0.1)
Eosinophils Absolute: 0.1 10*3/uL (ref 0.0–0.5)
HGB: 14.1 g/dL (ref 11.6–15.9)
MCV: 88.3 fL (ref 79.5–101.0)
MONO%: 6.3 % (ref 0.0–14.0)
NEUT#: 2.3 10*3/uL (ref 1.5–6.5)
RBC: 4.8 10*6/uL (ref 3.70–5.45)
RDW: 12.2 % (ref 11.2–14.5)
WBC: 5.4 10*3/uL (ref 3.9–10.3)
lymph#: 2.7 10*3/uL (ref 0.9–3.3)

## 2011-02-18 ENCOUNTER — Other Ambulatory Visit (HOSPITAL_COMMUNITY): Payer: Self-pay | Admitting: Oncology

## 2011-02-21 LAB — CREATININE CLEARANCE, URINE, 24 HOUR
Collection Interval-CRCL: 24 hours
Creatinine, Urine: 72.8 mg/dL
Creatinine: 0.72 mg/dL (ref 0.50–1.10)

## 2011-02-21 LAB — 5 HIAA, QUANTITATIVE, URINE, 24 HOUR
5-HIAA, 24 Hr Urine: 9 mg/24 h — ABNORMAL HIGH (ref <=6.0)
Volume, Urine-5HIAA: 1600 mL/24 h

## 2011-07-13 ENCOUNTER — Telehealth: Payer: Self-pay | Admitting: Oncology

## 2011-07-13 NOTE — Telephone Encounter (Signed)
Called pt, left message for the upcoming app  February 2013

## 2011-07-30 ENCOUNTER — Telehealth: Payer: Self-pay | Admitting: Oncology

## 2011-07-30 NOTE — Telephone Encounter (Signed)
pt called to r/s 2/15 appt to 3/11  aom

## 2011-08-30 ENCOUNTER — Ambulatory Visit: Payer: 59 | Admitting: Oncology

## 2011-08-30 ENCOUNTER — Other Ambulatory Visit: Payer: 59 | Admitting: Lab

## 2011-09-23 ENCOUNTER — Encounter: Payer: Self-pay | Admitting: Oncology

## 2011-09-23 ENCOUNTER — Ambulatory Visit (HOSPITAL_BASED_OUTPATIENT_CLINIC_OR_DEPARTMENT_OTHER): Payer: 59 | Admitting: Oncology

## 2011-09-23 ENCOUNTER — Other Ambulatory Visit (HOSPITAL_BASED_OUTPATIENT_CLINIC_OR_DEPARTMENT_OTHER): Payer: 59 | Admitting: Lab

## 2011-09-23 VITALS — BP 132/85 | HR 78 | Temp 97.8°F | Ht 61.0 in | Wt 142.9 lb

## 2011-09-23 DIAGNOSIS — C7A012 Malignant carcinoid tumor of the ileum: Secondary | ICD-10-CM

## 2011-09-23 DIAGNOSIS — D3A012 Benign carcinoid tumor of the ileum: Secondary | ICD-10-CM

## 2011-09-23 LAB — COMPREHENSIVE METABOLIC PANEL
AST: 64 U/L — ABNORMAL HIGH (ref 0–37)
Albumin: 3.9 g/dL (ref 3.5–5.2)
BUN: 11 mg/dL (ref 6–23)
CO2: 31 mEq/L (ref 19–32)
Calcium: 9.7 mg/dL (ref 8.4–10.5)
Chloride: 102 mEq/L (ref 96–112)
Creatinine, Ser: 0.86 mg/dL (ref 0.50–1.10)
Glucose, Bld: 95 mg/dL (ref 70–99)
Potassium: 3.2 mEq/L — ABNORMAL LOW (ref 3.5–5.3)

## 2011-09-23 LAB — CBC WITH DIFFERENTIAL/PLATELET
BASO%: 0.6 % (ref 0.0–2.0)
Basophils Absolute: 0 10*3/uL (ref 0.0–0.1)
EOS%: 0.9 % (ref 0.0–7.0)
HGB: 13.8 g/dL (ref 11.6–15.9)
MCH: 29.6 pg (ref 25.1–34.0)
RBC: 4.66 10*6/uL (ref 3.70–5.45)
RDW: 12.6 % (ref 11.2–14.5)
lymph#: 2.4 10*3/uL (ref 0.9–3.3)

## 2011-09-23 LAB — LACTATE DEHYDROGENASE: LDH: 223 U/L (ref 94–250)

## 2011-09-23 NOTE — Progress Notes (Signed)
This office note has been dictated.  #[id number]

## 2011-09-24 ENCOUNTER — Telehealth: Payer: Self-pay | Admitting: Medical Oncology

## 2011-09-24 ENCOUNTER — Encounter: Payer: Self-pay | Admitting: Medical Oncology

## 2011-09-24 ENCOUNTER — Other Ambulatory Visit: Payer: Self-pay | Admitting: Medical Oncology

## 2011-09-24 DIAGNOSIS — D3A00 Benign carcinoid tumor of unspecified site: Secondary | ICD-10-CM

## 2011-09-24 MED ORDER — POTASSIUM CHLORIDE CRYS ER 20 MEQ PO TBCR: 20.0000 meq | EXTENDED_RELEASE_TABLET | Freq: Every day | ORAL | Status: DC

## 2011-09-24 NOTE — Telephone Encounter (Signed)
I called pt back to let her know that Dr. Arline Asp would like for her to take K-Dur 20 meq daily for 5 days. Since she recently had the stomach virus her potassium may be low due to the vomiting and diarrhea. Prescription sent to [facility].

## 2011-09-24 NOTE — Telephone Encounter (Signed)
I left pt a message regarding her potassium of 3.2. Per Dr. Arline Asp he would like for her to take K-Dur 20 meq every other day. I asked her to call me and let me know if she has any potassium or if we need to call her in a prescription.

## 2011-09-25 NOTE — Progress Notes (Signed)
Vanessa Waller, she had a colonoscopy in 05/2000 by Sabino Gasser, it was a normal exam done for heme positive stool. It is reasonable to repeat the colonoscopy for screening purpose.

## 2011-09-25 NOTE — Progress Notes (Signed)
CC:   [name]. [name], M.D. [name]. [name], MD Excell Seltzer. [name], M.D. [facility]  PROBLEM LIST: 1. 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 2. Elevated urinary 5HIAA level noted in July 2011. 3. Positive octreotide scan with a lesion in the right lower pelvis     adjacent to the bladder noted on 09/06/2010 felt to be due to a     uterine fibroid. 4. Uterine fibroids noted on imaging studies dating back to late April     2010. 5. Elevated liver transaminases noted in March 2007, status post     workup by Dr. Lina Sar in October 2010 suggesting the etiology     was hepatic steatosis.  MEDICATIONS: 1. Ascorbic acid 500 mg daily. 2. Aspirin 81 mg daily. 3. Calcium carbonate-vitamin D 1 tablet daily. 4. Vitamin D3 1000 units daily. 5. Multivitamins daily. 6. The patient is on no prescription medicines.  HISTORY:  I saw Vanessa Waller today for followup of her carcinoid tumor of the ileum stage III dating back to surgery in February of 2007.  The patient was having symptoms suggestive of intussusception occurring intermittently for several years prior to that.  Primary tumor was 2.0 cm in size.  It invaded into the peri-intestinal adipose tissue and focally into the serosa.  Lymphovascular and perineural space invasion were present.  All margins were negative, 2 out of 13 lymph nodes were positive.  The patient was last seen by [place] on 02/06/2011.  She remains asymptomatic with no symptoms to suggest recurrent disease.  She is active, feels generally well with no sense of ill health.  She works for the [employer].  She tells me that a couple weeks ago she had severe gastroenteritis suggestive of the Norovirus.  PHYSICAL EXAMINATION:  Vanessa Waller looks well.  Weight is 142 pounds 14.4 ounces.  Height  5 feet 1 inch, body surface area 1.67 meters squared. Blood pressure 132/85.  Other vital signs are normal.  There is no scleral icterus.  Mouth and pharynx are benign.  No peripheral adenopathy palpable.  Heart and lungs are normal.  Breasts are not examined.  The patient gets yearly mammograms and is due for mammograms in May.  Abdomen is benign.  Nontender with no organomegaly, masses palpable.  Extremities, no peripheral edema or clubbing.  No focal findings.  Neurologic exam is grossly normal.  LABORATORY DATA:  Today, white count 6.2, ANC 3.2, hemoglobin 13.8, hematocrit 41.3, platelets 309,000.  Chemistries and chromogranin A today are pending.  Chemistries today are notable for a potassium of 3.2, AST 64, ALT 70.  Albumin was 3.9 and LDH 223.  Chromogranin A today is pending.  Chromogranin A was 8.6, which is in the normal range, on 10/01/2010.  A 24 hour urine collection for 5HIAA was 9.0 on 02/18/2011, 9.6 on 08/20/2010, 8.5 on 01/22/2010, 5.6 on 02/27/2009 with normal being less than 6.0 mg per 24 hours.  IMAGING STUDIES: 1. Two view chest x-ray from 03/02/2010 showed no active disease. 2. CT scan of abdomen and pelvis with and without IV contrast from     03/02/2010 showed no evidence for recurrent or metastatic disease     in the abdomen.  There was diffuse hepatic steatosis,     nonobstructing left renal  stones and a fibroid uterus. 3. Octreotide (somatostatin-receptor) scan from 09/06/2010 showed a     focus of activity within the right lower pelvis adjacent to the     bladder.  This is the same site where there had been previously     seen a tiny focus of activity.  There was concern about recurrence     of the indolent neuroendocrine tumor. 4. MRI of the pelvis with and without IV contrast from 10/01/2010     showed mild interval growth of uterine lesions with imaging     characteristics most consistent with benign leiomyomas.  It was     noted that the lesion at the  posterior right pelvis had a potential     octreotide uptake which was most suggestive of uptake within a     benign leiomyoma rather than recurrent carcinoid tumor involving     the uterus.  It was suggested that if questions remain this lesion     could be biopsied or resected. 5. Bilateral screening mammograms from 12/03/2010 showed no evidence     for malignancy.  IMPRESSION AND PLAN:  Clinically Vanessa Waller continues to do well, now 6 years from the time of surgical resection of her carcinoid tumor.  There are no symptoms to suggest carcinoid syndrome.  She denies any abdominal pain, feels generally well.  Will go ahead and collect another 24 hour urine for 5HIAA as well as a creatinine clearance to assess accuracy and completeness of the 24 hour urine collection.  It has been concerning that the patient has had an elevation of her 5HIAA.  She does adhere to a special diet during this collection.  The patient was also asking me about colonoscopy.  Apparently her last colonoscopy was carried out at least 10 years ago.  I have suggested that she call Dr. Lina Sar or make an appointment with her to discuss this issue and probably arrange for another colonoscopy as Vanessa Waller will be turning 56 in mid April.  We will plan to see Vanessa Waller again in 6 months at which time we will check CBC, chemistries and chromogranin A.  If the 5HIAA level continues to rise then we may want to consider repeat imaging studies.    ______________________________ Samul Dada, M.D. DSM/MEDQ  D:  09/23/2011  T:  09/23/2011  Job:  [id number]

## 2011-09-27 ENCOUNTER — Telehealth: Payer: Self-pay | Admitting: Oncology

## 2011-09-27 LAB — CHROMOGRANIN A: Chromogranin A: 7.8 ng/mL (ref 1.9–15.0)

## 2011-09-27 NOTE — Telephone Encounter (Signed)
called pt with 9/12 appt and she r/s to 03/23/12     aom

## 2011-09-30 ENCOUNTER — Other Ambulatory Visit: Payer: Self-pay | Admitting: Medical Oncology

## 2011-10-02 ENCOUNTER — Telehealth: Payer: Self-pay | Admitting: Oncology

## 2011-10-02 NOTE — Telephone Encounter (Signed)
Added lb appt for 3/25 per 3/18 pof. Per pof pt aware.

## 2011-10-07 ENCOUNTER — Other Ambulatory Visit (HOSPITAL_BASED_OUTPATIENT_CLINIC_OR_DEPARTMENT_OTHER): Payer: 59 | Admitting: Lab

## 2011-10-07 DIAGNOSIS — D3A012 Benign carcinoid tumor of the ileum: Secondary | ICD-10-CM

## 2011-10-07 LAB — CREATININE CLEARANCE, URINE, 24 HOUR
Collection Interval-CRCL: 24 hours
Creatinine Clearance: 94 mL/min (ref 75–115)
Creatinine, 24H Ur: 1120 mg/d (ref 700–1800)
Creatinine, Urine: 140 mg/dL
Urine Total Volume-CRCL: 800 mL

## 2011-10-12 LAB — 5 HIAA, QUANTITATIVE, URINE, 24 HOUR: 5-HIAA, 24 Hr Urine: 11.1 mg/24 h — ABNORMAL HIGH (ref <=6.0)

## 2011-10-14 ENCOUNTER — Other Ambulatory Visit: Payer: Self-pay | Admitting: Oncology

## 2011-10-14 ENCOUNTER — Encounter: Payer: Self-pay | Admitting: Oncology

## 2011-10-14 DIAGNOSIS — D3A012 Benign carcinoid tumor of the ileum: Secondary | ICD-10-CM

## 2011-10-14 NOTE — Progress Notes (Signed)
24 hour urinary 5HIAA level was 11.1 on 10/07/11, up from 9.0 on 02/18/11.  We will obtain another octreotide scan and compare it to the scan of 09/06/10.

## 2011-10-15 ENCOUNTER — Telehealth: Payer: Self-pay

## 2011-10-15 NOTE — Telephone Encounter (Signed)
lvm that pt can go to work right after the octreotide scan.

## 2011-10-15 NOTE — Telephone Encounter (Signed)
S/w pt that her 5-HIAA urine test number is elevating and Dr Arline Asp wants her to have another octreotide scan. Pt expressed understanding and will await call from scheduler.

## 2011-10-21 ENCOUNTER — Telehealth: Payer: Self-pay | Admitting: Oncology

## 2011-10-21 NOTE — Telephone Encounter (Signed)
S/w pt re appt for octreotide scan, pt given appts for 4/23 and 4/24 @ 12 pm. Pt aware should she need to go on the 3rd nucmed will inform her and give her a time. Pt also given number for nucmed to call to r/s if she cannot keep this appt.

## 2011-11-04 ENCOUNTER — Other Ambulatory Visit: Payer: Self-pay | Admitting: Oncology

## 2011-11-04 ENCOUNTER — Encounter: Payer: Self-pay | Admitting: Oncology

## 2011-11-04 NOTE — Progress Notes (Signed)
11/04/2011 Good Afternoon Dr. Arline Asp,  The Tumor Imaging you requested for this patient has gone to physician-to-physician discussion.  The procedure is scheduled for tomorrow, 11/05/2011.  The case# [id number], [phone], opt 4.  This code always goes to this review unfortunately.  Would you like to do this today or for me to reschedule until later this week?    Thank you, Bonita Quin [phone]

## 2011-11-04 NOTE — Progress Notes (Signed)
Spoke with Dr. Kimber Relic from [facility] re authorization for the Octreotide nuclear scan.  She said that the submitted CPT code was incorrect for the patient's diagnosis.  She was going to put in the correct code and issue an authorization number good for 45 days.  We apparently do not need to do anything further.

## 2011-11-05 ENCOUNTER — Encounter (HOSPITAL_COMMUNITY)
Admission: RE | Admit: 2011-11-05 | Discharge: 2011-11-05 | Disposition: A | Discharge status: Home / Self Care | Payer: 59 | Source: Ambulatory Visit | Attending: Oncology | Admitting: Oncology

## 2011-11-05 DIAGNOSIS — R978 Other abnormal tumor markers: Secondary | ICD-10-CM | POA: Insufficient documentation

## 2011-11-05 DIAGNOSIS — D3A012 Benign carcinoid tumor of the ileum: Secondary | ICD-10-CM | POA: Insufficient documentation

## 2011-11-06 ENCOUNTER — Encounter (HOSPITAL_COMMUNITY)
Admission: RE | Admit: 2011-11-06 | Discharge: 2011-11-06 | Disposition: A | Discharge status: Home / Self Care | Payer: 59 | Source: Ambulatory Visit | Attending: Oncology | Admitting: Oncology

## 2011-11-07 ENCOUNTER — Encounter (HOSPITAL_COMMUNITY): Payer: Self-pay

## 2011-11-07 ENCOUNTER — Encounter (HOSPITAL_COMMUNITY)
Admission: RE | Admit: 2011-11-07 | Discharge: 2011-11-07 | Disposition: A | Discharge status: Home / Self Care | Payer: 59 | Source: Ambulatory Visit | Attending: Oncology | Admitting: Oncology

## 2011-11-07 MED ORDER — INDIUM IN-111 PENTETREOTIDE IV KIT
6.3000 | PACK | Freq: Once | INTRAVENOUS | Status: AC | PRN
  Administered 2011-11-06: 6.3 via INTRAVENOUS

## 2011-11-08 ENCOUNTER — Telehealth: Payer: Self-pay | Admitting: Oncology

## 2011-11-08 ENCOUNTER — Other Ambulatory Visit: Payer: Self-pay | Admitting: Medical Oncology

## 2011-11-08 ENCOUNTER — Other Ambulatory Visit: Payer: Self-pay | Admitting: Oncology

## 2011-11-08 ENCOUNTER — Telehealth: Payer: Self-pay | Admitting: Medical Oncology

## 2011-11-08 DIAGNOSIS — D3A00 Benign carcinoid tumor of unspecified site: Secondary | ICD-10-CM

## 2011-11-08 DIAGNOSIS — D3A012 Benign carcinoid tumor of the ileum: Secondary | ICD-10-CM

## 2011-11-08 NOTE — Telephone Encounter (Signed)
Spoke with pt to let her know that Dr.Murinson has ordered a CT. I gave her the results of her CT per Dr. Arline Asp and told her the radiologist recommends the study for further evaluation.

## 2011-11-08 NOTE — Telephone Encounter (Signed)
l/m with appts and to p/u contrast  aom 

## 2011-11-14 ENCOUNTER — Other Ambulatory Visit: Payer: 59 | Admitting: Lab

## 2011-11-14 ENCOUNTER — Other Ambulatory Visit (HOSPITAL_COMMUNITY): Payer: 59

## 2011-11-14 ENCOUNTER — Telehealth: Payer: Self-pay

## 2011-11-14 NOTE — Telephone Encounter (Signed)
Pt stated she called Tues to let us know she was postponing her CT, she had conflicting schedule at work. She states she will reschedule.

## 2011-12-30 ENCOUNTER — Other Ambulatory Visit: Payer: Self-pay

## 2012-01-07 ENCOUNTER — Telehealth: Payer: Self-pay

## 2012-01-07 NOTE — Telephone Encounter (Signed)
Pt states she is in [facility] and will reschedule CT when she gets back, about next Tues. Affirmed pt has appt here in sept.

## 2012-01-07 NOTE — Telephone Encounter (Signed)
lvm that pt needs to reschedule her CT from early May that never got done. Gave x-ray scheduling number [phone]

## 2012-01-29 ENCOUNTER — Telehealth: Payer: Self-pay | Admitting: Nurse Practitioner

## 2012-01-29 NOTE — Telephone Encounter (Signed)
Spoke with patient re: CT scan.  She states she is going to call and reschedule it this week.

## 2012-02-04 ENCOUNTER — Telehealth: Payer: Self-pay | Admitting: Medical Oncology

## 2012-02-04 ENCOUNTER — Encounter: Payer: Self-pay | Admitting: Medical Oncology

## 2012-02-04 ENCOUNTER — Other Ambulatory Visit: Payer: Self-pay | Admitting: Oncology

## 2012-02-04 NOTE — Telephone Encounter (Signed)
I spoke with pt regarding her CT appointment. She states she is going to reschedule. She has been out of town on vacation and she will call to schedule.

## 2012-02-05 ENCOUNTER — Other Ambulatory Visit: Payer: Self-pay | Admitting: Medical Oncology

## 2012-02-05 DIAGNOSIS — D0140 Carcinoma in situ of unspecified part of intestine: Secondary | ICD-10-CM

## 2012-02-24 ENCOUNTER — Ambulatory Visit (HOSPITAL_COMMUNITY)
Admission: RE | Admit: 2012-02-24 | Discharge: 2012-02-24 | Disposition: A | Discharge status: Home / Self Care | Payer: 59 | Source: Ambulatory Visit | Attending: Oncology | Admitting: Oncology

## 2012-02-24 DIAGNOSIS — K760 Fatty (change of) liver, not elsewhere classified: Secondary | ICD-10-CM

## 2012-02-24 DIAGNOSIS — D0140 Carcinoma in situ of unspecified part of intestine: Secondary | ICD-10-CM

## 2012-02-24 DIAGNOSIS — K5730 Diverticulosis of large intestine without perforation or abscess without bleeding: Secondary | ICD-10-CM | POA: Insufficient documentation

## 2012-02-24 DIAGNOSIS — N859 Noninflammatory disorder of uterus, unspecified: Secondary | ICD-10-CM | POA: Insufficient documentation

## 2012-02-24 DIAGNOSIS — K7689 Other specified diseases of liver: Secondary | ICD-10-CM | POA: Insufficient documentation

## 2012-02-24 DIAGNOSIS — R1909 Other intra-abdominal and pelvic swelling, mass and lump: Secondary | ICD-10-CM | POA: Insufficient documentation

## 2012-02-24 HISTORY — DX: Fatty (change of) liver, not elsewhere classified: K76.0

## 2012-02-24 MED ORDER — IOHEXOL 300 MG/ML  SOLN
100.0000 mL | Freq: Once | INTRAMUSCULAR | Status: AC | PRN
  Administered 2012-02-24: 100 mL via INTRAVENOUS

## 2012-03-18 ENCOUNTER — Encounter: Payer: Self-pay | Admitting: Oncology

## 2012-03-18 NOTE — Progress Notes (Signed)
I reviewed the most recent CT scan of abdomen and pelvis with IV contrast from 02/24/2012 and compared it with the octreotide nuclear scan which was carried out on 11/05/2011. It appears that there are a couple of lesions which appear to be associated with the uterus in the right pelvis may be increasing in size. In addition,  these lesions had uptake on the octreotide scan. It is therefore concerning that these lesions may be due to recurrent carcinoid tumor.  There was also a slightly enlarged lymph node along the superior mesenteric vein seen on image 57 of series 4 on the CT scan. The most recent 24-hour urine 5 HIAA level was 11.1 on 10/07/2011. This seems to be slowly increasing.  The chromogranin A level remains normal.  The patient is due to be seen here on Monday, September 9. She will need to have a repeat 24-hour urine collection for 5-HIAA and possibly another octreotide study. She may require exploratory surgery to try to resect these areas of concern and if there is recurrent carcinoid tumor, the patient may benefit from an attempt at debulking surgery to render her disease-free.

## 2012-03-23 ENCOUNTER — Telehealth: Payer: Self-pay | Admitting: Oncology

## 2012-03-23 ENCOUNTER — Other Ambulatory Visit (HOSPITAL_BASED_OUTPATIENT_CLINIC_OR_DEPARTMENT_OTHER): Payer: 59 | Admitting: Lab

## 2012-03-23 ENCOUNTER — Encounter: Payer: Self-pay | Admitting: Oncology

## 2012-03-23 ENCOUNTER — Ambulatory Visit (HOSPITAL_BASED_OUTPATIENT_CLINIC_OR_DEPARTMENT_OTHER): Payer: 59 | Admitting: Oncology

## 2012-03-23 ENCOUNTER — Other Ambulatory Visit: Payer: Self-pay | Admitting: Medical Oncology

## 2012-03-23 ENCOUNTER — Ambulatory Visit: Payer: 59 | Admitting: Oncology

## 2012-03-23 VITALS — BP 140/82 | HR 82 | Temp 98.4°F | Resp 20 | Ht 61.0 in | Wt 142.8 lb

## 2012-03-23 DIAGNOSIS — D3A012 Benign carcinoid tumor of the ileum: Secondary | ICD-10-CM

## 2012-03-23 DIAGNOSIS — D0140 Carcinoma in situ of unspecified part of intestine: Secondary | ICD-10-CM

## 2012-03-23 LAB — COMPREHENSIVE METABOLIC PANEL (CC13)
ALT: 49 U/L (ref 0–55)
AST: 41 U/L — ABNORMAL HIGH (ref 5–34)
Albumin: 4 g/dL (ref 3.5–5.0)
Alkaline Phosphatase: 60 U/L (ref 40–150)
BUN: 11 mg/dL (ref 7.0–26.0)
Calcium: 9.5 mg/dL (ref 8.4–10.4)
Chloride: 106 mEq/L (ref 98–107)
Potassium: 4 mEq/L (ref 3.5–5.1)
Sodium: 140 mEq/L (ref 136–145)
Total Protein: 7.3 g/dL (ref 6.4–8.3)

## 2012-03-23 LAB — CBC WITH DIFFERENTIAL/PLATELET
EOS%: 1.2 % (ref 0.0–7.0)
Eosinophils Absolute: 0.1 10*3/uL (ref 0.0–0.5)
LYMPH%: 40.7 % (ref 14.0–49.7)
MCH: 29.9 pg (ref 25.1–34.0)
MCV: 89.3 fL (ref 79.5–101.0)
MONO%: 6.4 % (ref 0.0–14.0)
NEUT#: 2.8 10*3/uL (ref 1.5–6.5)
Platelets: 243 10*3/uL (ref 145–400)
RBC: 5.04 10*6/uL (ref 3.70–5.45)
RDW: 12.4 % (ref 11.2–14.5)

## 2012-03-23 NOTE — Telephone Encounter (Signed)
Pt will get the 24hr jug today adn will call back to sch the scan as she needs to ck with her sch at work,09/2012 appt made and printed      aom

## 2012-03-23 NOTE — Progress Notes (Signed)
This office note has been dictated.  #[id number]

## 2012-03-24 NOTE — Progress Notes (Signed)
CC:   [name]. [name], MD Excell Seltzer. [name], M.D. [name], MD [name]. [name], M.D. [facility], [place], [place] [phone]  PROBLEM LIST:  1. 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  2. Elevated urinary 5HIAA level noted in July 2011.  3. Positive octreotide scan with a lesion in the right lower pelvis  adjacent to the bladder noted on 09/06/2010 felt to be due to a  uterine fibroid.  4. Uterine fibroids noted on imaging studies dating back to late April  2010.  5. Elevated liver transaminases noted in March 2007, status post  workup by Dr. Lina Sar in October 2010 suggesting the etiology  was hepatic steatosis. 6. Abnormal octreotide scan on 11/05/2011 and a CT scan of abdomen and pelvis with IV contrast on 02/24/2012.   MEDICATIONS:  1. Ascorbic acid 500 mg daily.  2. Aspirin 81 mg daily.  3. Calcium carbonate-vitamin D 1 tablet daily.  4. Vitamin D3 1000 units daily.  5. Multivitamins daily.  6. The patient is on no prescription medicines.    HISTORY:  Vanessa Waller was seen today for followup of her carcinoid tumor of the ileum, stage III, dating back to February 2007 when she underwent an surgical resection.  The patient had been having symptoms suggestive of intussusception occurring intermittently for several years.  Primary tumor was 2.0 cm in size and invaded into the peri- intestinal adipose tissue and focally into the serosa.  Lymphovascular and perineural space invasion were present.  All margins were negative; 2/13 lymph nodes were positive.  The patient was last seen by [place] on 09/23/2011.  She remains completely asymptomatic with no abdominal symptoms or any symptoms to suggest carcinoid syndrome.  We remain concerned about her elevated 5-HIAA and abnormality seen on  recent scans.  These issues were discussed with the patient in detail.  She was given copies of her recent scans.  PHYSICAL EXAMINATION:  The patient looks well.  Weight is 142.8 pounds, height 5 feet 1 inch, body surface area 1.67.  The blood pressure 140/82.  Other vital signs are normal.  There is no scleral icterus. Mouth and pharynx are benign.  No peripheral adenopathy palpable.  Heart and lungs are normal.  Breasts are not examined.  The patient's most recent mammogram was on 12/03/2010.  The patient was urged to have mammogram this year as soon as possible.  Abdomen:  Benign with no organomegaly or masses palpable.  No tenderness.  Extremities:  No peripheral edema or clubbing.  No focal neurologic findings.  No Port-A- Cath or central catheter is present.  LABORATORY DATA:  Today, white count 5.5, ANC 2.8, hemoglobin 15.1, hematocrit 45.1, platelets 243,000.  Chemistries from today were notable for an AST of 41, ALT of 49 and a glucose of 105.  Albumin was 4.0.  LDH was 196.  A 24-hour urinary 5-HIAA was 11.1 on 10/07/2011 as compared with 9.0 on 02/18/2011; it was 9.6 on 08/21/2011, and 8.5 on 01/22/2010.  On 02/27/2009,  the 24-hour urine 5-HIAA was 5.9, which is normal.  The chromogranin A on 09/23/2011 was 7.8, which is normal.  IMAGING STUDIES:  1. Two view chest x-ray from 03/02/2010 showed no active disease.  2. CT scan of abdomen and pelvis with and without IV contrast from  03/02/2010 showed no evidence  for recurrent or metastatic disease  in the abdomen. There was diffuse hepatic steatosis,  nonobstructing left renal stones and a fibroid uterus.  3. Octreotide (somatostatin-receptor) scan from 09/06/2010 showed a  focus of activity within the right lower pelvis adjacent to the  bladder. This is the same site where there had been previously  seen a tiny focus of activity. There was concern about recurrence  of the indolent neuroendocrine tumor.  4. MRI of the  pelvis with and without IV contrast from 10/01/2010  showed mild interval growth of uterine lesions with imaging  characteristics most consistent with benign leiomyomas. It was  noted that the lesion at the posterior right pelvis had a potential  octreotide uptake which was most suggestive of uptake within a  benign leiomyoma rather than recurrent carcinoid tumor involving  the uterus. It was suggested that if questions remain this lesion  could be biopsied or resected.  5. Bilateral screening mammograms from 12/03/2010 showed no evidence  for malignancy. 6. Octreotide scan from 11/05/2011 showed persistent uptake within the right abdomen.  This was felt to be indeterminate and could represent bowel activity but cannot exclude a metastatic lesion within the small bowel or the mesentery. There was activity in the posterior right lower pelvis corresponding to the abnormal activity seen on comparison exam from the octreotide scan of 09/06/2010.  This activity was persistent and potentially increased.  Activity was localized to the right aspect of the uterus on comparison MRI and suggested to be uptake within a leiomyoma versus a potential carcinoid metastasis to the uterus.  CT scan of the abdomen and pelvis was recommended for further evaluation.  The indeterminate activity seen in the right abdomen would also be addressed on the CT scan. 7. CT scan of abdomen and pelvis with IV contrast carried out on 02/24/2012 showed 3 avidly enhancing lesions in the pelvis.  Two of these appear to be intimately associated with the uterus and were favored to represent fibroids, although both were larger than on the prior study of 06/13/2008.  A 3rd lesion on image 88 of series 4 was present along the right pelvic sidewall of uncertain etiology and significance.  There was also mild colonic diverticulosis and hepatic steatosis.  There was a mildly enlarged lymph node along the superior mesenteric vein.   Further description states that within the anatomic pelvis, there were 2 avidly enhancing lesions intimately associated with the uterus, the largest of which is on the right side between the uterus and the adjacent adnexa (image 96 of series 4) measuring approximately 3.3 x 2.8 x 2.6 cm.  The other lesion along the posterior aspect of the body of the uterus (image 102 of series 4) measures approximately 2.1 x 1.4 x 1.6 cm.  There was also a heterogeneous enhancing soft tissue lesion next to the right pelvic sidewall adjacent to the right external iliac artery and vein (image 88 of series 4) measuring 1.4 x 1.7 cm.  The mildly enlarged lymph node along the superior mesenteric vein was seen on image 57 of series 4.  IMPRESSION AND PLAN:  These abnormal findings were discussed with the patient.  She was given a copy of the octreotide and CT scans.  In view of the elevated 24 hour urinary 5-HIAA, I am certainly concerned about the possibility for recurrence in this indolent cancer.  We will plan to go ahead and collect another 24 hour urine for 5-HIAA.  Will also go ahead with another octreotide scan to  compare that with the prior scan of 11/05/2011.  If the findings continue to be of concern,  then I will refer the patient back to Dr. Carolynne Edouard for consideration of exploratory laparotomy and resection of these suspicious areas.  The patient may also need Dr. Renaldo Fiddler to participate in the surgery.  At the present time, we are having difficulty determining whether the lesions adjacent to the uterus are  fibroids or possibly recurrent carcinoid tumor. There is also concern about the apparent lymph node or other lesion that is being seen on both studies in the region of the superior mesenteric Vein.  The patient was encouraged to follow through with mammogram and colonoscopy screening.  The patient has been scheduled to see me again in approximately 6 months, which will be mid March of 2014.  At  that time, she will have CBC,  chemistries and LDH.    ______________________________ Samul Dada, M.D. DSM/MEDQ  D:  03/23/2012  T:  03/24/2012  Job:  [id number]

## 2012-03-26 ENCOUNTER — Other Ambulatory Visit: Payer: 59 | Admitting: Lab

## 2012-03-26 ENCOUNTER — Ambulatory Visit: Payer: 59 | Admitting: Oncology

## 2012-03-27 LAB — CHROMOGRANIN A: Chromogranin A: 18 ng/mL — ABNORMAL HIGH (ref 1.9–15.0)

## 2012-04-13 ENCOUNTER — Ambulatory Visit: Payer: 59

## 2012-04-13 ENCOUNTER — Other Ambulatory Visit: Payer: Self-pay | Admitting: Oncology

## 2012-04-13 DIAGNOSIS — D3A012 Benign carcinoid tumor of the ileum: Secondary | ICD-10-CM

## 2012-04-17 ENCOUNTER — Telehealth: Payer: Self-pay

## 2012-04-17 LAB — 5 HIAA, QUANTITATIVE, URINE, 24 HOUR: 5-HIAA, 24 Hr Urine: 22.7 mg/24 h — ABNORMAL HIGH (ref <=6.0)

## 2012-04-17 NOTE — Telephone Encounter (Signed)
lvm that Dr Arline Asp wants pt to have an octreotide scan. Note from 9/9 said she would call after checking her school schedule. Left phone for nuclear med scheduling [phone], so pt can work around her work/school schedule.

## 2012-04-20 ENCOUNTER — Telehealth: Payer: Self-pay | Admitting: Medical Oncology

## 2012-04-20 ENCOUNTER — Encounter: Payer: Self-pay | Admitting: Medical Oncology

## 2012-04-20 NOTE — Progress Notes (Signed)
Pt called this am and left a message for her results from 24 hr urine. I called her back and left a message that her 5 HIAA is elevated and Dr. Arline Asp is very concerned that she has not gotten her octreotide scan. She called me back to let me know she has called nuclear med to see if she can get it done this week. Dr. Arline Asp notified.

## 2012-04-20 NOTE — Telephone Encounter (Signed)
Pt called and left a message that she has her octreotide scan rescheduled for 05/05/12. Dr. Arline Asp notified.

## 2012-05-05 ENCOUNTER — Encounter (HOSPITAL_COMMUNITY)
Admission: RE | Admit: 2012-05-05 | Discharge: 2012-05-05 | Disposition: A | Discharge status: Home / Self Care | Payer: 59 | Source: Ambulatory Visit | Attending: Oncology | Admitting: Oncology

## 2012-05-05 DIAGNOSIS — D3A012 Benign carcinoid tumor of the ileum: Secondary | ICD-10-CM

## 2012-05-06 ENCOUNTER — Encounter (HOSPITAL_COMMUNITY): Payer: Self-pay

## 2012-05-06 ENCOUNTER — Encounter (HOSPITAL_COMMUNITY)
Admission: RE | Admit: 2012-05-06 | Discharge: 2012-05-06 | Disposition: A | Discharge status: Home / Self Care | Payer: 59 | Source: Ambulatory Visit | Attending: Oncology | Admitting: Oncology

## 2012-05-06 DIAGNOSIS — C7B8 Other secondary neuroendocrine tumors: Secondary | ICD-10-CM | POA: Insufficient documentation

## 2012-05-06 DIAGNOSIS — C7A00 Malignant carcinoid tumor of unspecified site: Secondary | ICD-10-CM | POA: Insufficient documentation

## 2012-05-06 MED ORDER — INDIUM IN-111 PENTETREOTIDE IV KIT
6.5000 | PACK | Freq: Once | INTRAVENOUS | Status: AC | PRN
  Administered 2012-05-05: 6.5 via INTRAVENOUS

## 2012-05-07 ENCOUNTER — Encounter (HOSPITAL_COMMUNITY): Payer: 59

## 2012-05-07 ENCOUNTER — Encounter: Payer: Self-pay | Admitting: Oncology

## 2012-05-07 NOTE — Progress Notes (Signed)
This patient's has demonstrated increasing tumor markers and a persistently abnormal octreotide scan.  Chromogranin A. on 03/23/2012 was 18.0 as compared with 7.8 on 09/23/2011.  24-hour urine 5 HIAA on 04/13/2012 was 22.7 as compared with 11.1 on 10/07/2011 and 9.0 on 02/18/2011.  The octreotide scan from 05/06/2012 was reviewed with the radiologist. There was a focus of abnormal accumulation of activity within the central right abdomen below the level of the kidneys. This correlates to a mesenteric lymph node seen on a CT scan from 02/24/2012. There is also uptake within the deep right pelvis which also correlates to an enlarging right periuterine mass on the comparison CT scan consistent with tumor recurrence. There is a third lesion in the more lateral right pelvis in the region of the iliac fossa. This correlates to an 18 mm lesion on comparison CT scan. No evidence for hepatic metastases.  I spoke with Dr. Carolynne Edouard today. He will review the scans and determine whether the patient should be considered for exploration in the hopes of resecting of her disease. We may want to have her presented at GI tumor conference.

## 2012-05-19 ENCOUNTER — Telehealth: Payer: Self-pay | Admitting: Medical Oncology

## 2012-05-19 NOTE — Telephone Encounter (Signed)
I called pt per Dr. Arline Asp to let her know that he has spoken with Dr. Carolynne Edouard about her scans and octreotide scans. Dr. Arline Asp is very concerned for recurrence as he discussed with her at his last visit. He would like for her to see Dr. Carolynne Edouard about possible surgical resection. She agrees the sooner we address this the better.

## 2012-05-21 ENCOUNTER — Ambulatory Visit (INDEPENDENT_AMBULATORY_CARE_PROVIDER_SITE_OTHER): Payer: 59 | Admitting: General Surgery

## 2012-05-21 ENCOUNTER — Encounter (INDEPENDENT_AMBULATORY_CARE_PROVIDER_SITE_OTHER): Payer: Self-pay | Admitting: General Surgery

## 2012-05-21 VITALS — BP 142/80 | HR 92 | Temp 97.4°F | Resp 20 | Ht 61.0 in | Wt 147.4 lb

## 2012-05-21 DIAGNOSIS — D3A012 Benign carcinoid tumor of the ileum: Secondary | ICD-10-CM

## 2012-05-21 NOTE — Patient Instructions (Signed)
Will consult with GYN Oncology

## 2012-05-21 NOTE — Progress Notes (Signed)
Subjective:     Patient ID: Vanessa Waller, female   DOB: [birth year], 56 y.o.   MRN: [id number]  HPI We're asked to the patient in consultation by Dr. Arline Asp to evaluate her for a possible recurrence of her carcinoid tumor. 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 At that time she was having a lot of obstructive symptoms. Since then she has been followed by Dr. Arline Asp. She underwent a CT scan and octreotide scan recently that suggested there may be some small recurrence in the mesentery of her small bowel and retroperitoneum. There was also a much larger area that enhanced in her right pelvis in an area that was previously thought to be fibroids of her uterus. Fortunately at this point she appears to be asymptomatic. She denies any flushing or diaphoresis. Her appetite is good and her bowels are working normally.  Review of Systems  Constitutional: Negative.   HENT: Negative.   Eyes: Negative.   Respiratory: Negative.   Cardiovascular: Negative.   Gastrointestinal: Negative.   Genitourinary: Negative.   Musculoskeletal: Negative.   Skin: Negative.   Neurological: Negative.   Hematological: Negative.   Psychiatric/Behavioral: Negative.        Objective:   Physical Exam  Constitutional: She is oriented to person, place, and time. She appears well-developed and well-nourished.  HENT:  Head: Normocephalic and atraumatic.  Eyes: Conjunctivae normal and EOM are normal. Pupils are equal, round, and reactive to light.  Neck: Normal range of motion. Neck supple.  Cardiovascular: Normal rate, regular rhythm and normal heart sounds.   Pulmonary/Chest: Effort normal and breath sounds normal.  Abdominal: Soft. Bowel sounds are normal. She exhibits no mass. There is no tenderness.  Musculoskeletal: Normal range of motion.  Neurological: She is alert and oriented to person, place, and time.  Skin:  Skin is warm and dry.  Psychiatric: She has a normal mood and affect. Her behavior is normal.       Assessment:     The patient may have some recurrence of her carcinoid tumor in her small bowel mesentery and retroperitoneum as well as a possible larger area involving the wall of the uterus. For this reason we may need to get the GYN oncologist involved and get their input as to how to approach this. We will recheck to them and have them reviewed the scans and then hopefully be able to sit down and make a comprehensive plan together    Plan:     Plan to consult the GYN oncologists for surgical planning.

## 2012-05-29 ENCOUNTER — Other Ambulatory Visit (HOSPITAL_COMMUNITY): Payer: Self-pay | Admitting: Obstetrics and Gynecology

## 2012-05-29 DIAGNOSIS — Z1231 Encounter for screening mammogram for malignant neoplasm of breast: Secondary | ICD-10-CM

## 2012-06-04 ENCOUNTER — Telehealth (INDEPENDENT_AMBULATORY_CARE_PROVIDER_SITE_OTHER): Payer: Self-pay | Admitting: General Surgery

## 2012-06-04 NOTE — Telephone Encounter (Signed)
Vanessa Waller spoke with you yesterday and called to speak with you today, she is pt of Dr. Carolynne Edouard. After 1PM would be preferable/ [phone]

## 2012-06-18 ENCOUNTER — Ambulatory Visit (HOSPITAL_COMMUNITY)
Admission: RE | Admit: 2012-06-18 | Discharge: 2012-06-18 | Disposition: A | Discharge status: Home / Self Care | Payer: 59 | Source: Ambulatory Visit | Attending: Obstetrics and Gynecology | Admitting: Obstetrics and Gynecology

## 2012-06-18 DIAGNOSIS — Z1231 Encounter for screening mammogram for malignant neoplasm of breast: Secondary | ICD-10-CM

## 2012-06-23 ENCOUNTER — Other Ambulatory Visit: Payer: Self-pay | Admitting: Obstetrics and Gynecology

## 2012-06-23 DIAGNOSIS — R928 Other abnormal and inconclusive findings on diagnostic imaging of breast: Secondary | ICD-10-CM

## 2012-06-28 ENCOUNTER — Encounter (HOSPITAL_COMMUNITY): Payer: Self-pay | Admitting: *Deleted

## 2012-06-28 ENCOUNTER — Emergency Department (HOSPITAL_COMMUNITY): Payer: 59

## 2012-06-28 ENCOUNTER — Emergency Department (HOSPITAL_COMMUNITY)
Admission: EM | Admit: 2012-06-28 | Discharge: 2012-06-28 | Disposition: A | Discharge status: Home / Self Care | Payer: 59 | Source: Home / Self Care | Attending: Emergency Medicine | Admitting: Emergency Medicine

## 2012-06-28 DIAGNOSIS — Z859 Personal history of malignant neoplasm, unspecified: Secondary | ICD-10-CM

## 2012-06-28 DIAGNOSIS — C7B8 Other secondary neuroendocrine tumors: Secondary | ICD-10-CM | POA: Diagnosis not present

## 2012-06-28 DIAGNOSIS — Z880 Allergy status to penicillin: Secondary | ICD-10-CM

## 2012-06-28 DIAGNOSIS — R109 Unspecified abdominal pain: Secondary | ICD-10-CM | POA: Diagnosis not present

## 2012-06-28 DIAGNOSIS — K7689 Other specified diseases of liver: Secondary | ICD-10-CM | POA: Diagnosis present

## 2012-06-28 DIAGNOSIS — N63 Unspecified lump in unspecified breast: Secondary | ICD-10-CM | POA: Diagnosis present

## 2012-06-28 DIAGNOSIS — Z7982 Long term (current) use of aspirin: Secondary | ICD-10-CM | POA: Insufficient documentation

## 2012-06-28 DIAGNOSIS — K859 Acute pancreatitis without necrosis or infection, unspecified: Secondary | ICD-10-CM | POA: Insufficient documentation

## 2012-06-28 DIAGNOSIS — K219 Gastro-esophageal reflux disease without esophagitis: Secondary | ICD-10-CM | POA: Diagnosis present

## 2012-06-28 DIAGNOSIS — R7402 Elevation of levels of lactic acid dehydrogenase (LDH): Secondary | ICD-10-CM | POA: Diagnosis present

## 2012-06-28 DIAGNOSIS — Z8509 Personal history of malignant neoplasm of other digestive organs: Secondary | ICD-10-CM | POA: Insufficient documentation

## 2012-06-28 DIAGNOSIS — R112 Nausea with vomiting, unspecified: Secondary | ICD-10-CM | POA: Diagnosis present

## 2012-06-28 DIAGNOSIS — Z881 Allergy status to other antibiotic agents status: Secondary | ICD-10-CM

## 2012-06-28 DIAGNOSIS — R7401 Elevation of levels of liver transaminase levels: Secondary | ICD-10-CM | POA: Diagnosis present

## 2012-06-28 LAB — CBC WITH DIFFERENTIAL/PLATELET
Basophils Absolute: 0 10*3/uL (ref 0.0–0.1)
Basophils Relative: 0 % (ref 0–1)
Eosinophils Absolute: 0 10*3/uL (ref 0.0–0.7)
HCT: 42.7 % (ref 36.0–46.0)
Hemoglobin: 14.3 g/dL (ref 12.0–15.0)
MCH: 29.2 pg (ref 26.0–34.0)
MCHC: 33.5 g/dL (ref 30.0–36.0)
Monocytes Absolute: 0.3 10*3/uL (ref 0.1–1.0)
Monocytes Relative: 4 % (ref 3–12)
Neutrophils Relative %: 75 % (ref 43–77)
RDW: 12.1 % (ref 11.5–15.5)

## 2012-06-28 LAB — URINALYSIS, ROUTINE W REFLEX MICROSCOPIC
Ketones, ur: NEGATIVE mg/dL
Leukocytes, UA: NEGATIVE
Nitrite: NEGATIVE
Specific Gravity, Urine: 1.019 (ref 1.005–1.030)
pH: 6 (ref 5.0–8.0)

## 2012-06-28 LAB — COMPREHENSIVE METABOLIC PANEL
Albumin: 4.2 g/dL (ref 3.5–5.2)
BUN: 9 mg/dL (ref 6–23)
Calcium: 9.8 mg/dL (ref 8.4–10.5)
Creatinine, Ser: 0.79 mg/dL (ref 0.50–1.10)
Total Protein: 7.8 g/dL (ref 6.0–8.3)

## 2012-06-28 LAB — URINE MICROSCOPIC-ADD ON

## 2012-06-28 LAB — LIPASE, BLOOD: Lipase: 179 U/L — ABNORMAL HIGH (ref 11–59)

## 2012-06-28 MED ORDER — MORPHINE SULFATE 4 MG/ML IJ SOLN
4.0000 mg | Freq: Once | INTRAMUSCULAR | Status: AC
  Administered 2012-06-28: 4 mg via INTRAVENOUS
  Filled 2012-06-28: qty 1

## 2012-06-28 MED ORDER — ONDANSETRON 8 MG PO TBDP
8.0000 mg | ORAL_TABLET | Freq: Once | ORAL | Status: AC
  Administered 2012-06-28: 8 mg via ORAL
  Filled 2012-06-28: qty 1

## 2012-06-28 MED ORDER — OXYCODONE-ACETAMINOPHEN 5-325 MG PO TABS: 1.0000 | ORAL_TABLET | Freq: Four times a day (QID) | ORAL | Status: DC | PRN

## 2012-06-28 MED ORDER — OXYCODONE-ACETAMINOPHEN 5-325 MG PO TABS
1.0000 | ORAL_TABLET | Freq: Once | ORAL | Status: AC
  Administered 2012-06-28: 1 via ORAL
  Filled 2012-06-28: qty 1

## 2012-06-28 MED ORDER — METOCLOPRAMIDE HCL 5 MG/ML IJ SOLN
10.0000 mg | Freq: Once | INTRAMUSCULAR | Status: AC | PRN
  Administered 2012-06-28: 10 mg via INTRAVENOUS
  Filled 2012-06-28: qty 2

## 2012-06-28 MED ORDER — ONDANSETRON 8 MG PO TBDP: 8.0000 mg | ORAL_TABLET | Freq: Three times a day (TID) | ORAL | Status: DC | PRN

## 2012-06-28 NOTE — ED Notes (Signed)
RN to obtain labs with start of IV 

## 2012-06-28 NOTE — ED Notes (Signed)
Pt c/o abd pain that started today. Reports feeling bloated and having back pain. Pt reports nausea. No vomiting.

## 2012-06-28 NOTE — ED Provider Notes (Signed)
History    CSN: [id number] Arrival date & time 06/28/12  1656 First MD Initiated Contact with Patient 06/28/12 1745    Chief Complaint  Patient presents with  . Abdominal Pain    HPI Abdominal and back pain since about 3 am. No diarrhea.  No vomiting. Some nausea.  The pain is periumbilical, radiates to the back and is 7/10.  Not sharp, constant "pain".  No relief with ibuprofen. This evening she had a chicken biscuit, cup of coffee, and some chocolate and since then the pain has increased.  Stomach feels bloated. She has history of carcinoid tumor.  She is concerned that these symptoms are similar to that pain in the past.  The symptoms have not resovled so she decided to come in.  She has plans to have surgery because her doctors are concerned that her carcinoid tumor has returned based on urine collection tests.  She has had a CT scan that showed a tumor near her cervix as well as lymph node enlargement.  Past Medical History  Diagnosis Date  . 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 Cancer     Past Surgical History  Procedure Date  . Tubal ligation   . Tumor removal   No hysterectomy, GB or appendix surgery  Family History  Problem Relation Age of Onset  . Cancer Father     skin  . Cancer Paternal Grandmother     breast    History  Substance Use Topics  . Smoking status: Never Smoker   . Smokeless tobacco: Never Used  . Alcohol Use: No    OB History    Grav Para Term Preterm Abortions TAB SAB Ect Mult Living                  Review of Systems  All other systems reviewed and are negative.    Allergies  Amoxicillin and Cephalexin  Home Medications   Current Outpatient Rx  Name  Route  Sig  Dispense  Refill  . ASCORBIC ACID 500 MG PO TABS   Oral   Take 500 mg by mouth daily.         . ASPIRIN 81 MG PO TABS   Oral   Take 81 mg by mouth daily.         [name] CALCIUM 600-D PO   Oral   Take 1 tablet by mouth daily.         [name] VITAMIN D3 1000 UNITS PO  CAPS   Oral   Take 1 capsule by mouth daily.         [name] ONE-DAILY MULTI VITAMINS PO TABS   Oral   Take 1 tablet by mouth daily.           BP 159/95  Pulse 100  Temp 98.4 F (36.9 C) (Oral)  Resp 18  Ht 5\' 1"  (1.549 m)  Wt 140 lb (63.504 kg)  BMI 26.45 kg/m2  SpO2 100%  Physical Exam  Nursing note and vitals reviewed. Constitutional: She appears well-developed and well-nourished. No distress.  HENT:  Head: Normocephalic and atraumatic.  Right Ear: External ear normal.  Left Ear: External ear normal.  Eyes: Conjunctivae normal are normal. Right eye exhibits no discharge. Left eye exhibits no discharge. No scleral icterus.  Neck: Neck supple. No tracheal deviation present.  Cardiovascular: Normal rate, regular rhythm and intact distal pulses.   Pulmonary/Chest: Effort normal and breath sounds normal. No stridor. No respiratory distress. She has no  wheezes. She has no rales.  Abdominal: Soft. Bowel sounds are normal. She exhibits no distension. There is no tenderness. There is no rebound and no guarding.  Musculoskeletal: She exhibits no edema and no tenderness.  Neurological: She is alert. She has normal strength. No sensory deficit. Cranial nerve deficit:  no gross defecits noted. She exhibits normal muscle tone. She displays no seizure activity. Coordination normal.  Skin: Skin is warm and dry. No rash noted.  Psychiatric: She has a normal mood and affect.    ED Course  Procedures (including critical care time) Reviewed old records  CT scan report from August IMPRESSION:  1. There are three avidly enhancing lesions in the pelvis, as  detailed above. Two of these appear intimately associated with the  uterus, and are favored to represent fibroids (although both are  larger than prior study 06/13/2008). The third lesion (image 88 of  series 4) is present along the right pelvic sidewall. This is of  uncertain etiology and significance. Differential considerations   include a soft tissue mass/adenopathy in the ileocolic mesentery,  an enhancing right external iliac lymph node, or a soft tissue mass  along the pelvic sidewall. There is also a mildly enlarged lymph  node along the superior mesenteric vein (image 57 of series 4). In  this patient with history of carcinoid tumor, these findings are  concerning for local recurrence of disease with some nodal spread.  2. Mild colonic diverticulosis without findings to suggest acute  diverticulitis at this time.  3. Hepatic steatosis with areas of focal fatty sparing  redemonstrated, as above. Pt did not have evidence of gallstones on that imaging tests. 8:49 PM Repeat exam without focal ttp.  Labs Reviewed  COMPREHENSIVE METABOLIC PANEL - Abnormal; Notable for the following:    Glucose, Bld 125 (*)     AST 49 (*)     ALT 56 (*)     All other components within normal limits  LIPASE, BLOOD - Abnormal; Notable for the following:    Lipase 179 (*)     All other components within normal limits  URINALYSIS, ROUTINE W REFLEX MICROSCOPIC - Abnormal; Notable for the following:    APPearance CLOUDY (*)     Hgb urine dipstick SMALL (*)     All other components within normal limits  CBC WITH DIFFERENTIAL  URINE MICROSCOPIC-ADD ON   Dg Abd Acute W/chest  06/28/2012  *RADIOLOGY REPORT*  Clinical Data: Abdominal pain 1 day.  ACUTE ABDOMEN SERIES (ABDOMEN 2 VIEW & CHEST 1 VIEW)  Comparison: CT abdomen/pelvis 02/24/2012  Findings: Lungs are clear.  Cardiomediastinal silhouette and remainder of the chest is within normal.  Abdominal pelvic images demonstrate a nonobstructive bowel gas pattern.  There is no mass or pathologic calcifications present. Bones and soft tissues are otherwise unremarkable.  IMPRESSION: No acute findings.   Original Report Authenticated By: Elberta Fortis, M.D.      1. Pancreatitis       MDM  Patient is a mild case of pancreatitis. The etiology of this is uncertain. She does not drink  alcohol to any significant amount. The patient does not have history of gallbladder disease. She did have a CAT scan in August that did not show any signs of gallstones or gallbladder pathology. Patient is known to have a probable recurrent carcinoid tumor. She does have plans for surgery in January.    The patient is feeling much better at this time. I think it is reasonable to try outpatient  treatment of her mild pancreatitis. At this time and do not feel that gallbladder imaging is necessary. She may benefit from  repeat abdominal CT scan considering her tumor history but I feel this can safely be done as an outpatient. The patient will be prescribed Percocet and Zofran. She will do 24 hours a liquid diet. She has been instructed return to emergency room for worsening symptoms and otherwise followup with her primary doctor this week to be rechecked       Celene Kras, MD 06/28/12 2052

## 2012-06-29 ENCOUNTER — Telehealth: Payer: Self-pay

## 2012-06-29 NOTE — Telephone Encounter (Signed)
Pt called to let us know she was in ER last night. She was sent home with a diagnosis of acute pancreatitis. She had home instructions of liquid diet for a few days and a prescription for pain meds. The symptom that sent her to ER was abd pain and low back pain. No nausea, no vomiting, no diarrhea. Pt had no appetite and still has no appetite. Is drinking gatorade etc. Her PCP is [facility] at [facility].

## 2012-06-30 ENCOUNTER — Encounter: Payer: Self-pay | Admitting: Family

## 2012-06-30 ENCOUNTER — Ambulatory Visit (HOSPITAL_BASED_OUTPATIENT_CLINIC_OR_DEPARTMENT_OTHER): Payer: 59 | Admitting: Family

## 2012-06-30 ENCOUNTER — Telehealth: Payer: Self-pay | Admitting: Oncology

## 2012-06-30 ENCOUNTER — Encounter (HOSPITAL_COMMUNITY): Payer: Self-pay

## 2012-06-30 ENCOUNTER — Ambulatory Visit (HOSPITAL_COMMUNITY)
Admission: RE | Admit: 2012-06-30 | Discharge: 2012-06-30 | Disposition: A | Discharge status: Home / Self Care | Payer: 59 | Source: Ambulatory Visit | Attending: Family | Admitting: Family

## 2012-06-30 ENCOUNTER — Other Ambulatory Visit: Payer: Self-pay

## 2012-06-30 ENCOUNTER — Inpatient Hospital Stay (HOSPITAL_COMMUNITY)
Admission: AD | Admit: 2012-06-30 | Discharge: 2012-07-03 | DRG: 842 | Disposition: A | Discharge status: Home / Self Care | Payer: 59 | Source: Ambulatory Visit | Attending: Internal Medicine | Admitting: Internal Medicine

## 2012-06-30 VITALS — BP 144/84 | HR 94 | Temp 97.4°F | Resp 20 | Ht 61.0 in | Wt 141.7 lb

## 2012-06-30 DIAGNOSIS — N6320 Unspecified lump in the left breast, unspecified quadrant: Secondary | ICD-10-CM | POA: Diagnosis present

## 2012-06-30 DIAGNOSIS — Z7982 Long term (current) use of aspirin: Secondary | ICD-10-CM | POA: Diagnosis not present

## 2012-06-30 DIAGNOSIS — D3A00 Benign carcinoid tumor of unspecified site: Secondary | ICD-10-CM | POA: Insufficient documentation

## 2012-06-30 DIAGNOSIS — R7401 Elevation of levels of liver transaminase levels: Secondary | ICD-10-CM | POA: Diagnosis present

## 2012-06-30 DIAGNOSIS — K760 Fatty (change of) liver, not elsewhere classified: Secondary | ICD-10-CM | POA: Diagnosis present

## 2012-06-30 DIAGNOSIS — R109 Unspecified abdominal pain: Secondary | ICD-10-CM

## 2012-06-30 DIAGNOSIS — C7B8 Other secondary neuroendocrine tumors: Secondary | ICD-10-CM | POA: Insufficient documentation

## 2012-06-30 DIAGNOSIS — R112 Nausea with vomiting, unspecified: Secondary | ICD-10-CM | POA: Diagnosis present

## 2012-06-30 DIAGNOSIS — R748 Abnormal levels of other serum enzymes: Secondary | ICD-10-CM

## 2012-06-30 DIAGNOSIS — D0140 Carcinoma in situ of unspecified part of intestine: Secondary | ICD-10-CM

## 2012-06-30 DIAGNOSIS — K7689 Other specified diseases of liver: Secondary | ICD-10-CM | POA: Diagnosis present

## 2012-06-30 DIAGNOSIS — K219 Gastro-esophageal reflux disease without esophagitis: Secondary | ICD-10-CM | POA: Diagnosis present

## 2012-06-30 DIAGNOSIS — M549 Dorsalgia, unspecified: Secondary | ICD-10-CM | POA: Insufficient documentation

## 2012-06-30 DIAGNOSIS — Z880 Allergy status to penicillin: Secondary | ICD-10-CM | POA: Diagnosis not present

## 2012-06-30 DIAGNOSIS — D3A012 Benign carcinoid tumor of the ileum: Secondary | ICD-10-CM

## 2012-06-30 DIAGNOSIS — R7402 Elevation of levels of lactic acid dehydrogenase (LDH): Secondary | ICD-10-CM

## 2012-06-30 DIAGNOSIS — N63 Unspecified lump in unspecified breast: Secondary | ICD-10-CM | POA: Diagnosis present

## 2012-06-30 DIAGNOSIS — Z859 Personal history of malignant neoplasm, unspecified: Secondary | ICD-10-CM | POA: Diagnosis not present

## 2012-06-30 DIAGNOSIS — Z881 Allergy status to other antibiotic agents status: Secondary | ICD-10-CM | POA: Diagnosis not present

## 2012-06-30 LAB — PHOSPHORUS: Phosphorus: 2.5 mg/dL (ref 2.3–4.6)

## 2012-06-30 LAB — COMPREHENSIVE METABOLIC PANEL
AST: 34 U/L (ref 0–37)
CO2: 25 mEq/L (ref 19–32)
Calcium: 9.6 mg/dL (ref 8.4–10.5)
Creatinine, Ser: 0.71 mg/dL (ref 0.50–1.10)
GFR calc Af Amer: >90 mL/min (ref >90)
GFR calc non Af Amer: >90 mL/min (ref >90)
Total Protein: 7.7 g/dL (ref 6.0–8.3)

## 2012-06-30 LAB — APTT: aPTT: 30 seconds (ref 24–37)

## 2012-06-30 LAB — CBC WITH DIFFERENTIAL/PLATELET
Basophils Absolute: 0 10*3/uL (ref 0.0–0.1)
HCT: 41.7 % (ref 36.0–46.0)
Lymphocytes Relative: 12 % (ref 12–46)
Lymphs Abs: 1.3 10*3/uL (ref 0.7–4.0)
Neutro Abs: 8.7 10*3/uL — ABNORMAL HIGH (ref 1.7–7.7)
Platelets: 311 10*3/uL (ref 150–400)
RBC: 4.72 MIL/uL (ref 3.87–5.11)
RDW: 12.2 % (ref 11.5–15.5)
WBC: 10.6 10*3/uL — ABNORMAL HIGH (ref 4.0–10.5)

## 2012-06-30 LAB — TSH: TSH: 1.271 u[IU]/mL (ref 0.350–4.500)

## 2012-06-30 LAB — URINALYSIS, ROUTINE W REFLEX MICROSCOPIC
Ketones, ur: 40 mg/dL — AB
Nitrite: NEGATIVE
Protein, ur: 30 mg/dL — AB
Urobilinogen, UA: 0.2 mg/dL (ref 0.0–1.0)

## 2012-06-30 LAB — PROTIME-INR
INR: 1.02 (ref 0.00–1.49)
Prothrombin Time: 13.3 seconds (ref 11.6–15.2)

## 2012-06-30 LAB — URINE MICROSCOPIC-ADD ON

## 2012-06-30 LAB — LIPASE, BLOOD: Lipase: 35 U/L (ref 11–59)

## 2012-06-30 MED ORDER — IOHEXOL 300 MG/ML  SOLN
80.0000 mL | Freq: Once | INTRAMUSCULAR | Status: AC | PRN
  Administered 2012-06-30: 80 mL via INTRAVENOUS

## 2012-06-30 MED ORDER — ACETAMINOPHEN 650 MG RE SUPP: 650.0000 mg | Freq: Four times a day (QID) | RECTAL | Status: DC | PRN

## 2012-06-30 MED ORDER — HYDROMORPHONE HCL PF 1 MG/ML IJ SOLN
1.0000 mg | INTRAMUSCULAR | Status: DC | PRN
  Administered 2012-06-30: 2 mg via INTRAVENOUS
  Administered 2012-06-30: 1 mg via INTRAVENOUS
  Administered 2012-06-30: 2 mg via INTRAVENOUS
  Administered 2012-07-01 (×5): 1 mg via INTRAVENOUS
  Administered 2012-07-01: 2 mg via INTRAVENOUS
  Administered 2012-07-01 – 2012-07-02 (×2): 1 mg via INTRAVENOUS
  Filled 2012-06-30 (×4): qty 1
  Filled 2012-06-30 (×3): qty 2
  Filled 2012-06-30 (×4): qty 1
  Filled 2012-06-30: qty 2

## 2012-06-30 MED ORDER — VITAMIN D3 25 MCG (1000 UNIT) PO TABS
1000.0000 [IU] | ORAL_TABLET | Freq: Every day | ORAL | Status: DC
  Filled 2012-06-30 (×4): qty 1

## 2012-06-30 MED ORDER — HYDROCODONE-ACETAMINOPHEN 5-325 MG PO TABS: 1.0000 | ORAL_TABLET | ORAL | Status: DC | PRN

## 2012-06-30 MED ORDER — ACETAMINOPHEN 325 MG PO TABS: 650.0000 mg | ORAL_TABLET | Freq: Four times a day (QID) | ORAL | Status: DC | PRN

## 2012-06-30 MED ORDER — POLYETHYLENE GLYCOL 3350 17 G PO PACK
17.0000 g | PACK | Freq: Every day | ORAL | Status: DC | PRN
  Administered 2012-07-02: 17 g via ORAL
  Filled 2012-06-30: qty 1

## 2012-06-30 MED ORDER — SODIUM CHLORIDE 0.9 % IV SOLN
INTRAVENOUS | Status: DC
  Administered 2012-06-30 – 2012-07-01 (×3): via INTRAVENOUS

## 2012-06-30 MED ORDER — DOCUSATE SODIUM 100 MG PO CAPS
100.0000 mg | ORAL_CAPSULE | Freq: Two times a day (BID) | ORAL | Status: DC
  Administered 2012-06-30 – 2012-07-03 (×6): 100 mg via ORAL
  Filled 2012-06-30 (×7): qty 1

## 2012-06-30 MED ORDER — ADULT MULTIVITAMIN W/MINERALS CH
1.0000 | ORAL_TABLET | Freq: Every day | ORAL | Status: DC
  Filled 2012-06-30 (×4): qty 1

## 2012-06-30 MED ORDER — ONDANSETRON HCL 4 MG/2ML IJ SOLN
4.0000 mg | Freq: Four times a day (QID) | INTRAMUSCULAR | Status: DC | PRN
  Administered 2012-06-30 – 2012-07-01 (×2): 4 mg via INTRAVENOUS
  Filled 2012-06-30 (×2): qty 2

## 2012-06-30 MED ORDER — ASPIRIN EC 81 MG PO TBEC
81.0000 mg | DELAYED_RELEASE_TABLET | Freq: Every day | ORAL | Status: DC
  Administered 2012-07-02: 81 mg via ORAL
  Filled 2012-06-30 (×4): qty 1

## 2012-06-30 MED ORDER — MORPHINE SULFATE 2 MG/ML IJ SOLN
1.0000 mg | INTRAMUSCULAR | Status: DC | PRN
  Administered 2012-06-30: 1 mg via INTRAVENOUS
  Filled 2012-06-30: qty 1

## 2012-06-30 MED ORDER — ONDANSETRON HCL 4 MG PO TABS: 4.0000 mg | ORAL_TABLET | Freq: Four times a day (QID) | ORAL | Status: DC | PRN

## 2012-06-30 MED ORDER — VITAMIN C 500 MG PO TABS
500.0000 mg | ORAL_TABLET | Freq: Every day | ORAL | Status: DC
  Filled 2012-06-30 (×4): qty 1

## 2012-06-30 NOTE — H&P (Signed)
[facility] History and Physical  Vanessa Waller VHQ:[id number] DOB: 07-08-1956 DOA: 06/30/2012  Referring physician: ER physician PCP: No primary provider on file.   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 Patient was sent from cancer cnter for further evaluation of intractable abdominal pain. Patient reported pain started Sunday (2 days prior to this admission), started in the back as well as mid abdomen, 10 out of 10 in intensity, nonradiating, sharp. Patient reported taking ibuprofen however there was no significant symptomatic relief. Patient also reported being nauseous and had one episode of nonbloody vomiting today. Patient reports no diarrhea or constipation although her last bowel movement was 2 days prior to this admission. No reports of blood in the stool or in the urine. No complaints of chest pain, shortness of breath or palpitations. No complaints of fever or chills. No lightheadedness, dizziness or loss of consciousness. Of note, patient did have a workup for abdominal pain 06/28/2012. Workup revealed elevated lipase level at 175. Abdominal x-ray was done at that time and it showed no acute intra-abdominal findings.  Assessment and plan  Principal problem: Intractable abdominal pain  In the setting of previous history of carcinoid tumor  We will obtain CT abdomen/pelvis for further evaluation  Keep n.p.o.  Continue supportive care with IV fluids, and site and medics and analgesia. We initially started morphine which did not seem to provide any symptomatic relief so we will switch to Dilaudid for better pain control (Dilaudid 1-2 mg every 3 hours as needed for  pain IV)  We will followup on admission labs including lipase and amylase.  Active problems: Elevated liver enzymes  Patient did have previous workup for elevated liver enzymes, a workup done by Dr. Dickie La of GI. At that time it was determined that patient has hepatic steatosis  AST and ALT are still mildly elevated at 49 and 57; ALT is within normal limits   Code Status: full code   Family Communication: Sister at bedside  Disposition Plan: Direct admission to medicine floor from [facility]  Larose, Aniceto Boss, MD  [facility]  Pager [phone]   Consultants: Oncology (Dr. Arline Asp)  Anti-infectives  None   Review of Systems:  Constitutional: Negative for fever, chills and malaise/fatigue. Negative for diaphoresis.  HENT: Negative for hearing loss, ear pain, nosebleeds, congestion, sore throat, neck pain, tinnitus and ear discharge.   Eyes: Negative for blurred vision, double vision, photophobia, pain, discharge and redness.  Respiratory: Negative for cough, hemoptysis, sputum production, shortness of breath, wheezing and stridor.   Cardiovascular: Negative for chest pain, palpitations, orthopnea, claudication and leg swelling.  Gastrointestinal: Per history of present illness.  Genitourinary: Negative for dysuria, urgency, frequency, hematuria and flank pain.  Musculoskeletal: Negative for myalgias, back pain, joint pain and falls.  Skin: Negative for itching and rash.  Neurological: Negative for dizziness and weakness. Negative for tingling, tremors, sensory change, speech change, focal weakness, loss of consciousness and headaches.  Endo/Heme/Allergies: Negative for environmental allergies and polydipsia. Does not bruise/bleed easily.  Psychiatric/Behavioral: Negative for suicidal ideas. The patient is not nervous/anxious.      Past Medical History  Diagnosis Date  . 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 Cancer    Past Surgical History  Procedure  Date  . Tubal ligation   . Tumor  removal    Social History:  reports that she has never smoked. She has never used smokeless tobacco. She reports that she does not drink alcohol or use illicit drugs.  Allergies  Allergen Reactions  . Amoxicillin     REACTION: rash  . Cephalexin     REACTION: diarrhea    Family History:   Family History  Problem Relation Age of Onset  . Cancer Father     skin  . Cancer Paternal Grandmother     breast  . Diabetes Mother   . Hypertension Mother   . Hyperlipidemia Mother   . Hypothyroidism Sister   . Hyperlipidemia Sister   . Hypothyroidism Sister     Prior to Admission medications   Medication Sig Start Date End Date Taking? Authorizing Provider  ascorbic acid (VITAMIN C) 500 MG tablet Take 500 mg by mouth daily.    Historical Provider, MD  aspirin 81 MG tablet Take 81 mg by mouth daily.    Historical Provider, MD  Calcium Carbonate-Vitamin D (CALCIUM 600-D PO) Take 1 tablet by mouth daily.    Historical Provider, MD  Cholecalciferol (VITAMIN D3) 1000 UNITS CAPS Take 1 capsule by mouth daily.    Historical Provider, MD  Multiple Vitamin (MULTIVITAMIN) tablet Take 1 tablet by mouth daily.    Historical Provider, MD  ondansetron (ZOFRAN ODT) 8 MG disintegrating tablet Take 1 tablet (8 mg total) by mouth every 8 (eight) hours as needed for nausea. 06/28/12   Celene Kras, MD  oxyCODONE-acetaminophen (PERCOCET/ROXICET) 5-325 MG per tablet Take 1-2 tablets by mouth every 6 (six) hours as needed for pain. 06/28/12   Celene Kras, MD   Physical Exam: Filed Vitals:   06/30/12 1239  BP: 151/85  Pulse: 92  Temp: 98 F (36.7 C)  TempSrc: Oral  Resp: 20  Height: 5\' 1"  (1.549 m)  Weight: 63.957 kg (141 lb)  SpO2: 98%    Physical Exam  Constitutional: Appears well-developed and well-nourished. No distress.  HENT: Normocephalic. External right and left ear normal. Oropharynx is clear and moist.  Eyes: Conjunctivae and EOM are normal. PERRLA, no scleral icterus.  Neck: Normal ROM.  Neck supple. No JVD. No tracheal deviation. No thyromegaly.  CVS: RRR, S1/S2 +, no murmurs, no gallops, no carotid bruit.  Pulmonary: Effort and breath sounds normal, no stridor, rhonchi, wheezes, rales.  Abdominal: Soft. BS +,  no distension, tenderness over mid abdomen to deep palpation, rebound or guarding.  Musculoskeletal: Normal range of motion. No edema and no tenderness.  Lymphadenopathy: No lymphadenopathy noted, cervical, inguinal. Neuro: Alert. Normal reflexes, muscle tone coordination. No cranial nerve deficit. Skin: Skin is warm and dry. No rash noted. Not diaphoretic. No erythema. No pallor.  Psychiatric: Normal mood and affect. Behavior, judgment, thought content normal.   Labs on Admission:  Basic Metabolic Panel:  Lab 06/28/12 1610  NA 137  K 3.8  CL 100  CO2 23  GLUCOSE 125*  BUN 9  CREATININE 0.79  CALCIUM 9.8  MG --  PHOS --   Liver Function Tests:  Lab 06/28/12 1925  AST 49*  ALT 56*  ALKPHOS 60  BILITOT 0.4  PROT 7.8  ALBUMIN 4.2    Lab 06/28/12 1925  LIPASE 179*  AMYLASE --   No results found for this basename: AMMONIA:5 in the last 168 hours CBC:  Lab 06/28/12 1925  WBC 9.1  NEUTROABS 6.8  HGB 14.3  HCT  42.7  MCV 87.1  PLT 306   Cardiac Enzymes: No results found for this basename: CKTOTAL:5,CKMB:5,CKMBINDEX:5,TROPONINI:5 in the last 168 hours BNP: No components found with this basename: POCBNP:5 CBG: No results found for this basename: GLUCAP:5 in the last 168 hours  Radiological Exams on Admission: Dg Abd Acute W/chest 06/28/2012  * IMPRESSION: No acute findings.   Original Report Authenticated By: Elberta Fortis, M.D.    Time spent, 100 minutes

## 2012-06-30 NOTE — Telephone Encounter (Signed)
pt aware to reg for a 10:00 with jh today     anne

## 2012-06-30 NOTE — Patient Instructions (Addendum)
Let's complete CT now and you will be admitted soon.

## 2012-06-30 NOTE — Progress Notes (Signed)
Patient ID: Vanessa Waller, female   DOB: 20-Feb-1956, 56 y.o.   MRN: [id number] CSN: [id number]  CC: Vanessa Waller. [name], MD  Excell Seltzer. [name], MD [name], MD  [name]. [name], MD [facility], [place], [place] [phone]   Problem List: Vanessa Waller is a 56 y.o. Caucasian female with a problem list consisting of:  1. 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  2. Elevated urinary 5HIAA level noted in July 2011.  3. Positive octreotide scan with a lesion in the right lower pelvis adjacent to the bladder noted on 09/06/2010 felt to be due to a  uterine fibroid.  4. Uterine fibroids noted on imaging studies dating back to late April 2010.  5. Elevated liver transaminases noted in March 2007, status post workup by Dr. Lina Sar in October 2010 suggesting the etiology  was hepatic steatosis.  6. Abnormal octreotide scan on 11/05/2011 and a CT scan of abdomen and pelvis with IV contrast on 02/24/2012. 7. Mammogram on 06/19/2012 suggests further evaluation for possible mass in the left breast. 8. Elevated lipase of 179 on 06/28/2012 in the ER with a diagnosis of pancreatitis.    Dr. Arline Asp and I saw Vanessa Waller today as a walk-in appointment for intractable abdominal pain. She is accompanied by her sister Vanessa Waller  It will be noted that Vanessa Waller has a history of carcinoid tumor of the ileum, stage III, dating back to February 2007 when she underwent an surgical resection. The patient had been having symptoms suggestive of intussusception occurring intermittently for several years. Primary tumor was 2.0 cm in size and invaded into the peri-intestinal adipose tissue and focally into the serosa. Lymphovascular and perineural space invasion were present. All margins were negative; 2/13  lymph nodes were positive.  We remain concerned about her elevated 5-HIAA and abnormality seen on recent scans.   Vanessa Waller was last seen by [place] on 03/23/2012. She had remained completely asymptomatic with no abdominal symptoms or any symptoms to suggest carcinoid syndrome until the am of 06/28/2012.  She stated that her abdominal and back pain were so severe that it woke her from her sleep at 0300.  She had a bowel movement and went back to bed.  Unfortunately the abdominal and back pain returned, and the patient proceeded to go to the ER.  Upon arrival in the ER she was treated, had an abdominal x-ray, diagnosed with pancreatitis and given prescriptions for Zofran and Percocet and instructed to consume a clear liquid diet at home.  The patient presents to our office today complaining of worsening abdominal and back pain that is positional, constant, rated as an 8/10 and not being controlled by Percocet.  The patient states that she has not had much of an appetite, has not had a bowel movement since 06/28/2012, has not had any N/V/D and feels "warm."  The patient denies any unusual bleeding, fever, chills, night sweats or any other symptomatology.  Since her last office visit on 03/23/2012, the patient had a screening mammogram on 06/19/2012 that showed a possible mass in the left breast that warrants further evaluation.  Vanessa Waller is scheduled for a diagnostic mammogram tomorrow 07/01/2012.   Vanessa Waller has also been evaluated by both Dr. Carolynne Edouard and Dr. Stanford Breed for the progression of her carcinoid tumor.  The patient is scheduled for surgery on 08/07/2012 with both Dr. Carolynne Edouard and Dr. Stanford Breed.      Past Medical History: Past Medical History  Diagnosis Date  . 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 Cancer     Surgical History: Past Surgical History  Procedure Date  . Tubal ligation   . Tumor removal     Current Medications: No current outpatient prescriptions on file.     Allergies: Allergies  Allergen Reactions  . Amoxicillin     REACTION: rash  . Cephalexin     REACTION: diarrhea    Family History: Family History  Problem Relation Age of Onset  . Cancer Father     skin  . Cancer Paternal Grandmother     breast  . Diabetes Mother   . Hypertension Mother   . Hyperlipidemia Mother   . Hypothyroidism Sister   . Hyperlipidemia Sister   . Hypothyroidism Sister     Social History: History  Substance Use Topics  . Smoking status: Never Smoker   . Smokeless tobacco: Never Used  . Alcohol Use: No    Review of Systems: 10 Point review of systems was completed and is negative except as noted above.   Physical Exam:   Blood pressure 144/84, pulse 94, temperature 97.4 F (36.3 C), temperature source Oral, resp. rate 20, height 5\' 1"  (1.549 m), weight 141 lb 11.2 oz (64.275 kg).  General appearance: Alert, cooperative, well nourished, moderate distress Head: Normocephalic, without obvious abnormality, atraumatic Eyes: Conjunctivae/corneas clear, PERRLA, EOMI Nose: Nares, septum and mucosa are normal, no drainage or sinus tenderness Neck: No adenopathy, supple, symmetrical, trachea midline, thyroid not enlarged, no tenderness Resp: Clear to auscultation bilaterally Cardio: Regular rate and rhythm, S1, S2 normal, no murmur, click, rub or gallop GI: Abnormal findings:  distended, guarding and hyperactive bowel sounds Extremities: Extremities normal, atraumatic, no cyanosis or edema Lymph nodes: Cervical, supraclavicular, and axillary nodes normal Neurologic: Grossly normal  Laboratory Data: Results for orders placed during the hospital encounter of 06/28/12 (from the past 48 hour(s))  URINALYSIS, ROUTINE W REFLEX MICROSCOPIC     Status: Abnormal   Collection Time   06/28/12  5:59 PM      Component Value Range Comment   Color, Urine YELLOW  YELLOW    APPearance CLOUDY (*) CLEAR    Specific Gravity, Urine 1.019  1.005 - 1.030    pH 6.0   5.0 - 8.0    Glucose, UA NEGATIVE  NEGATIVE mg/dL    Hgb urine dipstick SMALL (*) NEGATIVE    Bilirubin Urine NEGATIVE  NEGATIVE    Ketones, ur NEGATIVE  NEGATIVE mg/dL    Protein, ur NEGATIVE  NEGATIVE mg/dL    Urobilinogen, UA 0.2  0.0 - 1.0 mg/dL    Nitrite NEGATIVE  NEGATIVE    Leukocytes, UA NEGATIVE  NEGATIVE   URINE MICROSCOPIC-ADD ON     Status: Normal   Collection Time   06/28/12  5:59 PM      Component Value Range Comment   Squamous Epithelial / LPF RARE  RARE    WBC, UA 0-2  <3 WBC/hpf    RBC / HPF 0-2  <3 RBC/hpf   CBC WITH DIFFERENTIAL     Status: Normal   Collection Time   06/28/12  7:25 PM      Component Value Range Comment   WBC 9.1  4.0 - 10.5 K/uL    RBC 4.90  3.87 - 5.11 MIL/uL  Hemoglobin 14.3  12.0 - 15.0 g/dL    HCT 16.1  09.6 - 04.5 %    MCV 87.1  78.0 - 100.0 fL    MCH 29.2  26.0 - 34.0 pg    MCHC 33.5  30.0 - 36.0 g/dL    RDW 40.9  81.1 - 91.4 %    Platelets 306  150 - 400 K/uL    Neutrophils Relative 75  43 - 77 %    Neutro Abs 6.8  1.7 - 7.7 K/uL    Lymphocytes Relative 22  12 - 46 %    Lymphs Abs 2.0  0.7 - 4.0 K/uL    Monocytes Relative 4  3 - 12 %    Monocytes Absolute 0.3  0.1 - 1.0 K/uL    Eosinophils Relative 0  0 - 5 %    Eosinophils Absolute 0.0  0.0 - 0.7 K/uL    Basophils Relative 0  0 - 1 %    Basophils Absolute 0.0  0.0 - 0.1 K/uL   COMPREHENSIVE METABOLIC PANEL     Status: Abnormal   Collection Time   06/28/12  7:25 PM      Component Value Range Comment   Sodium 137  135 - 145 mEq/L    Potassium 3.8  3.5 - 5.1 mEq/L    Chloride 100  96 - 112 mEq/L    CO2 23  19 - 32 mEq/L    Glucose, Bld 125 (*) 70 - 99 mg/dL    BUN 9  6 - 23 mg/dL    Creatinine, Ser 7.82  0.50 - 1.10 mg/dL    Calcium 9.8  8.4 - 95.6 mg/dL    Total Protein 7.8  6.0 - 8.3 g/dL    Albumin 4.2  3.5 - 5.2 g/dL    AST 49 (*) 0 - 37 U/L    ALT 56 (*) 0 - 35 U/L    Alkaline Phosphatase 60  39 - 117 U/L    Total Bilirubin 0.4  0.3 - 1.2 mg/dL    GFR calc non  Af Amer >90  >90 mL/min    GFR calc Af Amer >90  >90 mL/min   LIPASE, BLOOD     Status: Abnormal   Collection Time   06/28/12  7:25 PM      Component Value Range Comment   Lipase 179 (*) 11 - 59 U/L     Imaging Studies: 1. Two view chest x-ray from 03/02/2010 showed no active disease.   2. CT scan of abdomen and pelvis with and without IV contrast from 03/02/2010 showed no evidence for recurrent or metastatic disease in the abdomen. There was diffuse hepatic steatosis, nonobstructing left renal stones and a fibroid uterus.   3. Octreotide (somatostatin-receptor) scan from 09/06/2010 showed a focus of activity within the right lower pelvis adjacent to the  bladder. This is the same site where there had been previously seen a tiny focus of activity. There was concern about recurrence  of the indolent neuroendocrine tumor.   4. MRI of the pelvis with and without IV contrast from 10/01/2010 showed mild interval growth of uterine lesions with imaging characteristics most consistent with benign leiomyomas. It was noted that the lesion at the posterior right pelvis had a potential octreotide uptake which was most suggestive of uptake within a benign leiomyoma rather than recurrent carcinoid tumor involving the uterus. It was suggested that if questions remain this lesion could be biopsied or resected.   5.  Bilateral screening mammograms from 12/03/2010 showed no evidence for malignancy.   6. Octreotide scan from 11/05/2011 showed persistent uptake within the right abdomen. This was felt to be indeterminate and could represent bowel activity but cannot exclude a metastatic lesion within the small bowel or the mesentery. There was activity in the posterior right lower pelvis corresponding to the abnormal activity seen on comparison exam from the octreotide scan of 09/06/2010. This activity was persistent and potentially increased. Activity was localized to the right aspect of the uterus on comparison  MRI and suggested to be uptake within a leiomyoma versus a potential carcinoid metastasis to the uterus. CT scan of the abdomen and pelvis was recommended for further evaluation. The indeterminate activity seen in the right abdomen would also be addressed on the CT scan.   7. CT scan of abdomen and pelvis with IV contrast carried out on 02/24/2012 showed 3 avidly enhancing lesions in the pelvis. Two of these appear to be intimately associated with the uterus and were favored to represent fibroids, although both were larger than on the prior study of 06/13/2008. A 3rd lesion on image 88 of series 4 was present along the right pelvic sidewall of uncertain etiology and significance. There was also mild colonic diverticulosis and hepatic steatosis. There was a mildly enlarged lymph node along the superior mesenteric vein. Further description states that within the anatomic pelvis, there were 2 avidly enhancing lesions intimately associated with the uterus, the largest of which is on the right side between the uterus and the adjacent adnexa (image 96 of series 4) measuring approximately 3.3 x 2.8 x 2.6 cm. The other lesion along the posterior aspect of the body of the uterus (image 102 of series 4) measures approximately 2.1 x 1.4 x 1.6 cm. There was also a heterogeneous enhancing soft tissue lesion next to the right pelvic sidewall adjacent to the right external iliac artery and vein (image 88 of series 4) measuring 1.4 x 1.7 cm. The mildly enlarged lymph node along the superior mesenteric vein was seen on image 57 of series 4.   8. Bilateral screening mammogram on 06/19/2012  In the left breast, a possible mass warrants further evaluation with spot compression views and possibly ultrasound.  In the right breast, no masses or malignant type calcifications are identified.  9.  Diagnostic acute abdominal series (abdomen 2 views and chest 1 view) on 06/28/2012 showed no acute findings.   Impression/Plan: Dr.  Arline Asp would like this patient admitted to the hospital under the Hospitalist Service for further evaluation of the elevated lipase and intractable abdominal pain. A CT of the abdomen and pelvis with contrast has been ordered and the results are pending.   Dr. Arline Asp is  also concerned about the possibility for recurrence in this indolent cancer. At the present time, he is having difficulty determining whether the lesions adjacent to the uterus are fibroids or possibly recurrent carcinoid tumor. There is also concern about the apparent lymph node or other lesion that is being seen on both studies in the region of the superior mesenteric vein.  This patient has demonstrated increasing tumor markers and a persistently abnormal octreotide scan.  Chromogranin A. on 03/23/2012 was 18.0 as compared with 7.8 on 09/23/2011. 24-hour urine 5 HIAA on 04/13/2012 was 22.7 as compared with 11.1 on 10/07/2011 and 9.0 on 02/18/2011.  The octreotide scan from 05/06/2012 was reviewed with the radiologist.  She is scheduled for surgery on 08/07/2012 with Dr.Toth and Dr. Stanford Breed.  Ms. Delancey is scheduled to see [place] again on 09/21/2012 at which time we will check CBC, chemistries and LDH.  Ms. Westby and her sister are encouraged to contact us in the interim if they have any questions or concerns.   Larina Bras, NP-C 06/30/2012, 12:33 PM

## 2012-06-30 NOTE — Progress Notes (Signed)
[id number]/Torrion Witter, RN, BSN, CCM: CHART REVIEWED AND UPDATED.  Next chart review due on 12202013. NO DISCHARGE NEEDS PRESENT AT THIS TIME. CASE MANAGEMENT [phone] 

## 2012-07-01 ENCOUNTER — Telehealth: Payer: Self-pay | Admitting: Oncology

## 2012-07-01 ENCOUNTER — Other Ambulatory Visit: Payer: 59

## 2012-07-01 DIAGNOSIS — K760 Fatty (change of) liver, not elsewhere classified: Secondary | ICD-10-CM | POA: Diagnosis present

## 2012-07-01 DIAGNOSIS — C7A00 Malignant carcinoid tumor of unspecified site: Secondary | ICD-10-CM

## 2012-07-01 DIAGNOSIS — N6320 Unspecified lump in the left breast, unspecified quadrant: Secondary | ICD-10-CM | POA: Diagnosis present

## 2012-07-01 DIAGNOSIS — R109 Unspecified abdominal pain: Secondary | ICD-10-CM

## 2012-07-01 DIAGNOSIS — K219 Gastro-esophageal reflux disease without esophagitis: Secondary | ICD-10-CM | POA: Diagnosis present

## 2012-07-01 LAB — COMPREHENSIVE METABOLIC PANEL
Albumin: 3.7 g/dL (ref 3.5–5.2)
Alkaline Phosphatase: 54 U/L (ref 39–117)
BUN: 10 mg/dL (ref 6–23)
CO2: 25 mEq/L (ref 19–32)
Chloride: 99 mEq/L (ref 96–112)
Glucose, Bld: 91 mg/dL (ref 70–99)
Potassium: 4 mEq/L (ref 3.5–5.1)
Total Bilirubin: 0.7 mg/dL (ref 0.3–1.2)

## 2012-07-01 LAB — CBC
MCV: 91.1 fL (ref 78.0–100.0)
Platelets: 309 10*3/uL (ref 150–400)
RDW: 12.7 % (ref 11.5–15.5)
WBC: 12.9 10*3/uL — ABNORMAL HIGH (ref 4.0–10.5)

## 2012-07-01 LAB — GLUCOSE, CAPILLARY: Glucose-Capillary: 94 mg/dL (ref 70–99)

## 2012-07-01 MED ORDER — METOCLOPRAMIDE HCL 5 MG/ML IJ SOLN
5.0000 mg | Freq: Three times a day (TID) | INTRAMUSCULAR | Status: DC
  Administered 2012-07-01 – 2012-07-03 (×8): 5 mg via INTRAVENOUS
  Filled 2012-07-01 (×12): qty 1

## 2012-07-01 MED ORDER — PANTOPRAZOLE SODIUM 40 MG PO TBEC: 40.0000 mg | DELAYED_RELEASE_TABLET | Freq: Every day | ORAL | Status: DC | PRN

## 2012-07-01 MED ORDER — PANTOPRAZOLE SODIUM 40 MG PO TBEC: 40.0000 mg | DELAYED_RELEASE_TABLET | Freq: Every day | ORAL | Status: DC

## 2012-07-01 MED ORDER — DEXAMETHASONE 4 MG PO TABS
4.0000 mg | ORAL_TABLET | Freq: Three times a day (TID) | ORAL | Status: DC
  Filled 2012-07-01 (×3): qty 1

## 2012-07-01 MED ORDER — OXYCODONE HCL ER 15 MG PO T12A
15.0000 mg | EXTENDED_RELEASE_TABLET | Freq: Two times a day (BID) | ORAL | Status: DC
  Administered 2012-07-01 – 2012-07-03 (×5): 15 mg via ORAL
  Filled 2012-07-01 (×5): qty 1

## 2012-07-01 NOTE — Consult Note (Signed)
[facility]  [phone]   HEMATOLOGY ONCOLOGY CONSULTATION   Vanessa Waller  DOB: Jan 27, 1956  MR#: [id number]  CSN#: [id number]    Requesting Physician: [facility]  Primary MD: [facility], [place], [place] [phone]  Reason for consultation: 1. Rule out Carcinoid Recurrence vs. Other malignancy                                               Patient Identification: 56 year old white female followed by Dr. Arline Asp at the [facility] clinic for:  1. 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   2. Elevated urinary 5HIAA level noted in July 2011.   3. Positive octreotide scan with a lesion in the right lower pelvis adjacent to the bladder noted on 09/06/2010 felt to be due to a uterine fibroid.   4. Uterine fibroids noted on imaging studies dating back to late April 2010.   5. Elevated liver transaminases noted in March 2007, status post workup by Dr. Lina Sar in October 2010 suggesting the etiology was hepatic steatosis.   6. Abnormal octreotide scan on 11/05/2011 and a CT scan of abdomen and pelvis with IV contrast on 02/24/2012.   7. Mammogram on 06/19/2012 suggests further evaluation for possible mass in the left breast.   8. Elevated lipase of 179 on 06/28/2012 in the ER with a diagnosis of pancreatitis.   9. Status post bilateral tubal ligation, 1994.    History of present illness:  [name]  Was seen at the office of the Cancer Center with intractable, severe abdominal and back pain not relieved with pain meds. This event was preceded by a visit to the ER on 12/15 with similar complaints, at which time acute abdominal series were negative for abnormal findings. Her lipase was elevated at 179. She was diagnosed with pancreatitis and discharged home with antiemetics  and analgesics. On presentation to the clinic, her status was complicated by decreased appetite, constipation and subjective fever. She had intermittent nausea and one episode of non -bloody vomiting.  A CT of the Abdomen and Pelvis with contrast on 06/30/2012 revealed nodal metastases including a 1.5 cm node in the right jejunal mesentery with interval central necrosis and surrounding inflammatory changes without other  definite peripancreatic inflammatory changes by CT. A dominant 4.3 x 4.7 cm pelvic metastasis along the right uterus, increased in size.  An  additional 1.3 x 2.0 cm mass along the posterior lower uterus also likely reflecting  Metastasis was seen. A  1.3 x 1.6 cm suspected metastasis in the lateral segment left hepatic lobe, and slight increase on the R external iliac node were noted (See details of CT below). She was admitted under the Hospitalist service for evaluation and management of intractable abdominal pain. Labs noted mild elevation of AST and ALT at 49 and 57 respectively.Today, her abdominal pain continues to be present accompanied by bloating,constipation and nausea . No further vomiting.  Of note, Ms. Coufal has also been evaluated by both Dr. Carolynne Edouard and Dr. Stanford Breed for the progression of her carcinoid tumor. The patient was scheduled for surgery on 08/07/2012 with both Dr. Carolynne Edouard and Dr. Stanford Breed. They have been contacted by Dr.Jevon Shells to discuss further  plans.    Past medical and Surgical History history:      Past Medical History  Diagnosis Date  1 Carcinoid tumor as above 08/17/05    ileum  2 Abnormal mammogram on 12/6 for possible L breast mass .            3.  Prior history of NSAID-induced gastric antral ulcer.  See Patient identification for other pertinent medical/ surgical  history.      Medications:     . aspirin EC  81 mg Oral Daily  . cholecalciferol  1,000 Units Oral Daily  . docusate sodium  100 mg Oral BID  . multivitamin with minerals   1 tablet Oral Daily  . ascorbic acid  500 mg Oral Daily     ZOX:[id number], acetaminophen, HYDROcodone-acetaminophen, HYDROmorphone (DILAUDID) injection, ondansetron (ZOFRAN) IV, ondansetron, polyethylene glycol   Allergies:  Allergies  Allergen Reactions  . Amoxicillin     REACTION: rash  . Cephalexin     REACTION: diarrhea    Family history:     Family History  Problem Relation Age of Onset  . Cancer Father     skin  . Cancer Paternal Grandmother     breast  . Diabetes Mother   . Hypertension Mother   . Hyperlipidemia Mother   . Hypothyroidism Sister   . Hyperlipidemia Sister   . Hypothyroidism Sister        Social history:   The patient is single, no children. She is an Investment banker, corporate. No tobacco or alcohol history. She lives in [place]. [place].No tobacco or ETOH use. No recreational drugs.       Health maintenance:                 History  Substance Use Topics  . Smoking status: Never Smoker   . Smokeless tobacco: Never Used  . Alcohol Use: No        Review of systems:   See HPI  For significant positives,including continuous bloating sensation with no flatus, no bowel movements since 12/15 and abdominal pain, better controlled with current meds.  rest of ROS is negative.   Allergies:     Allergies  Allergen Reactions  . Amoxicillin     REACTION: rash  . Cephalexin     REACTION: diarrhea     Physical exam:      Filed Vitals:   07/01/12 0744  BP: 124/69  Pulse: 111  Temp: 98 F (36.7 C)  Resp: 16     Body mass index is 26.64 kg/(m^2).    General:  56 year- old WF in no acute distress A. and O. x3  well -developedand well-nourished.  HEENT: Normocephalic, atraumatic, PERRLA. Oral cavity without thrush or lesions. Wears glasses. Neck supple. no thyromegaly, no cervical or supraclavicular adenopathy  Lungs clear bilaterally . No wheezing, rhonchi or rales. No axillary masses. Breasts: no palpable masses, despite abnormal mammogram   On 12/6 (to be followed at the [facility] as outpatient) Cardiac regular rate and rhythm, no murmur , rubs or gallops Abdomen soft mid-epigastric tenderness to deep palpation, some tenderness on the RuQ on light palpation , bowel sounds x4, but decreased on the LUQ and LLQ. No HSM. No masses palpable.  GU/rectal: deferred. Extremities no clubbing, no  cyanosis or edema. No bruising or petechial rash.bilateral PAS cuffs. Musculoskeletal: no spinal tenderness.  Neuro: Non Focal   Lab results:   Chromogranin A. on 03/23/2012 was 18.0 as compared with 7.8 on 09/23/2011.  24-hour urine 5 HIAA on 04/13/2012 was 22.7 as compared with 11.1 on 10/07/2011 and 9.0 on 02/18/2011.        CBC  Lab 07/01/12 0357 06/30/12 1508 06/28/12 1925  WBC 12.9* 10.6* 9.1  HGB 13.8 13.8 14.3  HCT 43.1 41.7 42.7  PLT 309 311 306  MCV 91.1 88.3 87.1  MCH 29.2 29.2 29.2  MCHC 32.0 33.1 33.5  RDW 12.7 12.2 12.1  LYMPHSABS -- 1.3 2.0  MONOABS -- 0.6 0.3  EOSABS -- 0.0 0.0  BASOSABS -- 0.0 0.0  BANDABS -- -- --     Chemistries   Lab 07/01/12 0357 06/30/12 1508 06/28/12 1925  NA 135 134* 137  K 4.0 3.5 3.8  CL 99 95* 100  CO2 25 25 23   GLUCOSE 91 103* 125*  BUN 10 8 9   CREATININE 0.82 0.71 0.79  CALCIUM 9.0 9.6 9.8  MG -- 2.0 --     Coagulation profile  Lab 07/01/12 0357 06/30/12 1508  INR 1.01 1.02  PROTIME -- --    Urine Studies No results found for this basename: UACOL:2,UAPR:2,USPG:2,UPH:2,UTP:2,UGL:2,UKET:2,UBIL:2,UHGB:2,UNIT:2,UROB:2,ULEU:2,UEPI:2,UWBC:2,URBC:2,UBAC:2,CAST:2,CRYS:2,UCOM:2,BILUA:2 in the last 72 hours  Studies:       Ct Abdomen Pelvis W Contrast  06/30/2012 Comparison: 02/24/2012  Findings: Moderate hepatic steatosis.  1.3 x 1.6 cm hyperenhancing lesion in the lateral segment left hepatic lobe (series 2/image 15), increased from 03/02/2010, favored to reflect a metastasis. Additional increased perfusion along the gallbladder fossa (series 2/images 17 and 24),  favored to reflect focal fatty sparing. Focal hypoenhancing lesion along the falciform ligament (series 2/image 20), likely reflecting focal fat.  Spleen and adrenal glands are within normal limits.  Pancreas is unremarkable.  Aside from jejunal mesenteric stranding (noted below), no definite peripancreatic inflammatory changes by CT to confirm a diagnosis of acute pancreatitis.  Gallbladder is unremarkable.  No intrahepatic or extrahepatic ductal dilatation.  3 mm nonobstructing left interpolar renal calculus (series 2/image 24).  Right kidney is normal limits.  No hydronephrosis.  No evidence of bowel obstruction.  1.5 x 1.5 cm node in the right jejunal mesentery (series 2/image 39), suspicious for metastasis, previously 1.6 x 1.1 cm although now with surrounding inflammatory changes and central necrosis.  Additional numerous small jejunal mesenteric nodes, slightly increased.  1.4 x 1.8 cm right external iliac node (series 2/image 55), previously 1.4 x 1.6 cm. 7 x 6 mm node anterior to the left sacrum (series 2/image 51), unchanged.  No abdominopelvic ascites.  4.3 x 4.7 cm enhancing mass along the right uterus (series 2/image 61), previously 3.7 x 4.3 cm, with irregular hyperenhancement.  1.3 x 2.0 cm enhancing mass along the posterior lower uterus (series 2/image 65), previously 1.4 x 1.8 cm, uniformly hyperenhancing. The dominant lesion is highly suspicious for pelvic metastasis given its appearance and interval growth.  While the smaller lesion could still reflect a fibroid, it is favored to reflect a second metastasis.  Bladder is within normal limits.  Mild degenerative changes of the visualized thoracolumbar spine.  IMPRESSION: Nodal metastases, as described above, including a 1.5 cm node in the right jejunal mesentery with interval central necrosis and surrounding inflammatory changes.  Otherwise, no definite peripancreatic inflammatory changes by CT.  Dominant 4.3 x 4.7 cm pelvic metastasis along the  right uterus, increased.  Additional 1.3 x 2.0 cm mass along the posterior lower uterus also likely reflects a metastasis.  1.3 x 1.6 cm suspected metastasis in the lateral segment left hepatic lobe.   Original Report Authenticated By: Lurlean Horns  Rito Ehrlich, M.D.    Dg Abd Acute W/chest  06/28/2012 Findings: Lungs are clear.  Cardiomediastinal silhouette and remainder of the chest is within normal.  Abdominal pelvic images demonstrate a nonobstructive bowel gas pattern.  There is no mass or pathologic calcifications present. Bones and soft tissues are otherwise unremarkable.  IMPRESSION: No acute findings.   Original Report Authenticated By: Elberta Fortis, M.D.    Mm Digital Screening  06/19/2012   Comparison:  Previous exams.  FINDINGS:  Breast Density: ACR Category 3 The breast tissue is heterogeneously dense.  In the left breast, a possible mass warrants further evaluation with spot compression views and possibly ultrasound.  In the right breast, no masses or malignant type calcifications are identified.  Images were processed with CAD.  IMPRESSION: Further evaluation is suggested for possible mass in the left breast.  RECOMMENDATION: Diagnostic mammogram and possibly ultrasound of the left breast. (Code:FI-L-25M)  BI-RADS CATEGORY 0:  Incomplete.  Need additional imaging evaluation and/or prior mammograms for comparison.   Original Report Authenticated By: Norva Pavlov, M.D.     Assessmnent/Plan:56 y.o. female followed by Dr. Arline Asp for a history of Carcinoid tumor, s/p surgical resection in 2007 , currently under observation, admitted with intractable abdominal pain in the setting of abnormal CT of the abdomen and pelvis on 06/30/2012 suspicious for recurrence of disease, with nodal metastases,  increased pelvic metastases along the R uterus, now with new mass along the posterior lower uterus, as well as possible liver involvement .Consider check LDH and CEA. Dr. Carolynne Edouard and Dr. Stanford Breed have been  contacted by Dr. Arline Asp is to see the patient following this consult with recommendations regarding diagnosis, treatment options and further workup studies, and an addendum to this note to follow.  Thank you for the referral.   [facility] E 07/01/2012  Please refer to my separate note, dated 07/01/2012.

## 2012-07-01 NOTE — Progress Notes (Signed)
PT Cancellation Note  Patient Details Name: Vanessa Waller MRN: [id number] DOB: 1956-01-31   Cancelled Treatment:    Reason Eval/Treat Not Completed: Other (comment) (pt reports no difficulty with mobility). Pt does not need PT services   Donnetta Hail 07/01/2012, 10:50 AM

## 2012-07-01 NOTE — Progress Notes (Signed)
The patient was seen and examined this morning. I have reviewed the CT scan of the abdomen and pelvis carried out yesterday with the radiologist. Case has been discussed with Dr. Salome Arnt. As previously noted, this patient was found to have a carcinoid tumor of the ileum after undergoing surgical resection on 08/17/2005 for enigmatic paroxysmal abdominal pain which had been ongoing for several years. Two out of 13 lymph nodes were positive and thus the patient was stage III.  Presently , the patient's pain persists and is relieved transiently with IV narcotics. I am impressed with the rather sudden onset of this pain on the morning of 06/29/2012. The necrotic mesenteric lymph node raises the possibility of infarcted tumor as a possible etiology for her pain. There are no symptoms to suggest carcinoid syndrome. Tenderness is most notable in the midabdomen however the abdomen is otherwise benign and soft.  The CT scan of the abdomen and pelvis with IV contrast from 06/30/2012 shows the following:  Nodal metastases, as described above, including a 1.5 cm node in  the right jejunal mesentery with interval central necrosis and  surrounding inflammatory changes.  Otherwise, no definite peripancreatic inflammatory changes by CT.  Dominant 4.3 x 4.7 cm pelvic metastasis along the right uterus,  increased. Additional 1.3 x 2.0 cm mass along the posterior lower  uterus also likely reflects a metastasis.  1.3 x 1.6 cm suspected metastasis in the lateral segment left  hepatic lobe.   Impression and plan:  In view of the above, I am doubtful that this patient will be benefited by surgery. I have spoken with Dr. Carolynne Edouard and either he or one of his partners will see the patient today to address this issue. The patient may benefit from referral regarding recommendations for systemic treatment. I will look into this possibility, also clinical trials for carcinoid.  Recommendations regarding pain management  utilizing long acting narcotic such as OxyContin have been discussed with Dr. Darnelle Catalan. If pain control remains suboptimal, we may wish to consider a trial of corticosteroids.  The patient apparently has an abnormal mammogram involving the left breast and requires further evaluation. It is certainly unlikely that this abnormality is related to her metastatic carcinoid tumor. Hopefully it is a benign problem.  The CT findings and my impressions were discussed with the patient and her sister [name]. In response to their questions, they understand that the tumor is most likely incurable.  Our focus at this time is to control the patient's pain, to advance her diet as tolerated and hopefully to allow for management as an outpatient. Treatment is to be determined.    Kritika Stukes S 07/01/2012 11:12 AM

## 2012-07-01 NOTE — Consult Note (Signed)
Reason for Consult:Abd pain Referring Physician: Dr Darnelle Catalan  Vanessa Waller is an 56 y.o. female.  HPI: I was called by my partner, Dr Carolynne Edouard to see this patient.  Vanessa Waller is a 56 y.o. F who is s/p small bowel resection for carcinoid tumor.  Final pathology revealed 2 lymph nodes that were positive.  Vanessa Waller urine chemistries had started to go up several months ago and an octreotide scan reveals some pelvic disease.  A hysterectomy and possible debulking was planned in Jan.  Vanessa Waller then developed abd pain over the weekend and went to urgent care, where Vanessa Waller was told Vanessa Waller had mild pancreatitis.  Since that time Vanessa Waller has had no BM or flatus.  Vanessa Waller was admitted for increasing pain yesterday, and a CT scan shows a mass but no real signs of obstruction.  Currently Vanessa Waller is tolerating a diet.  Past Medical History  Diagnosis Date  . 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 Cancer     Past Surgical History  Procedure Date  . Tubal ligation   . Tumor removal     Family History  Problem Relation Age of Onset  . Cancer Father     skin  . Cancer Paternal Grandmother     breast  . Diabetes Mother   . Hypertension Mother   . Hyperlipidemia Mother   . Hypothyroidism Sister   . Hyperlipidemia Sister   . Hypothyroidism Sister     Social History:  reports that Vanessa Waller has never smoked. Vanessa Waller has never used smokeless tobacco. Vanessa Waller reports that Vanessa Waller does not drink alcohol or use illicit drugs.  Allergies:  Allergies  Allergen Reactions  . Amoxicillin     REACTION: rash  . Cephalexin     REACTION: diarrhea    Medications: I have reviewed the patient's current medications.  Results for orders placed during the hospital encounter of 06/30/12 (from the past 48 hour(s))  URINALYSIS, ROUTINE W REFLEX MICROSCOPIC     Status: Abnormal   Collection Time   06/30/12  2:19 PM      Component Value Range Comment   Color, Urine AMBER (*) YELLOW BIOCHEMICALS MAY BE AFFECTED BY COLOR   APPearance CLEAR  CLEAR    Specific Gravity,  Urine 1.044 (*) 1.005 - 1.030    pH 5.5  5.0 - 8.0    Glucose, UA NEGATIVE  NEGATIVE mg/dL    Hgb urine dipstick LARGE (*) NEGATIVE    Bilirubin Urine SMALL (*) NEGATIVE    Ketones, ur 40 (*) NEGATIVE mg/dL    Protein, ur 30 (*) NEGATIVE mg/dL    Urobilinogen, UA 0.2  0.0 - 1.0 mg/dL    Nitrite NEGATIVE  NEGATIVE    Leukocytes, UA NEGATIVE  NEGATIVE   URINE MICROSCOPIC-ADD ON     Status: Abnormal   Collection Time   06/30/12  2:19 PM      Component Value Range Comment   Squamous Epithelial / LPF FEW (*) RARE    RBC / HPF 11-20  <3 RBC/hpf    Bacteria, UA FEW (*) RARE    Urine-Other MUCOUS PRESENT     LIPASE, BLOOD     Status: Normal   Collection Time   06/30/12  3:08 PM      Component Value Range Comment   Lipase 35  11 - 59 U/L   AMYLASE     Status: Normal   Collection Time   06/30/12  3:08 PM      Component Value Range  Comment   Amylase 53  0 - 105 U/L   COMPREHENSIVE METABOLIC PANEL     Status: Abnormal   Collection Time   06/30/12  3:08 PM      Component Value Range Comment   Sodium 134 (*) 135 - 145 mEq/L    Potassium 3.5  3.5 - 5.1 mEq/L    Chloride 95 (*) 96 - 112 mEq/L    CO2 25  19 - 32 mEq/L    Glucose, Bld 103 (*) 70 - 99 mg/dL    BUN 8  6 - 23 mg/dL    Creatinine, Ser 1.61  0.50 - 1.10 mg/dL    Calcium 9.6  8.4 - 09.6 mg/dL    Total Protein 7.7  6.0 - 8.3 g/dL    Albumin 4.1  3.5 - 5.2 g/dL    AST 34  0 - 37 U/L    ALT 41 (*) 0 - 35 U/L    Alkaline Phosphatase 55  39 - 117 U/L    Total Bilirubin 0.6  0.3 - 1.2 mg/dL    GFR calc non Af Amer >90  >90 mL/min    GFR calc Af Amer >90  >90 mL/min   MAGNESIUM     Status: Normal   Collection Time   06/30/12  3:08 PM      Component Value Range Comment   Magnesium 2.0  1.5 - 2.5 mg/dL   PHOSPHORUS     Status: Normal   Collection Time   06/30/12  3:08 PM      Component Value Range Comment   Phosphorus 2.5  2.3 - 4.6 mg/dL   CBC WITH DIFFERENTIAL     Status: Abnormal   Collection Time   06/30/12  3:08 PM       Component Value Range Comment   WBC 10.6 (*) 4.0 - 10.5 K/uL    RBC 4.72  3.87 - 5.11 MIL/uL    Hemoglobin 13.8  12.0 - 15.0 g/dL    HCT 04.5  40.9 - 81.1 %    MCV 88.3  78.0 - 100.0 fL    MCH 29.2  26.0 - 34.0 pg    MCHC 33.1  30.0 - 36.0 g/dL    RDW 91.4  78.2 - 95.6 %    Platelets 311  150 - 400 K/uL    Neutrophils Relative 82 (*) 43 - 77 %    Neutro Abs 8.7 (*) 1.7 - 7.7 K/uL    Lymphocytes Relative 12  12 - 46 %    Lymphs Abs 1.3  0.7 - 4.0 K/uL    Monocytes Relative 5  3 - 12 %    Monocytes Absolute 0.6  0.1 - 1.0 K/uL    Eosinophils Relative 0  0 - 5 %    Eosinophils Absolute 0.0  0.0 - 0.7 K/uL    Basophils Relative 0  0 - 1 %    Basophils Absolute 0.0  0.0 - 0.1 K/uL   APTT     Status: Normal   Collection Time   06/30/12  3:08 PM      Component Value Range Comment   aPTT 30  24 - 37 seconds   PROTIME-INR     Status: Normal   Collection Time   06/30/12  3:08 PM      Component Value Range Comment   Prothrombin Time 13.3  11.6 - 15.2 seconds    INR 1.02  0.00 - 1.49   TSH  Status: Normal   Collection Time   06/30/12  3:08 PM      Component Value Range Comment   TSH 1.271  0.350 - 4.500 uIU/mL   COMPREHENSIVE METABOLIC PANEL     Status: Abnormal   Collection Time   07/01/12  3:57 AM      Component Value Range Comment   Sodium 135  135 - 145 mEq/L    Potassium 4.0  3.5 - 5.1 mEq/L    Chloride 99  96 - 112 mEq/L    CO2 25  19 - 32 mEq/L    Glucose, Bld 91  70 - 99 mg/dL    BUN 10  6 - 23 mg/dL    Creatinine, Ser 9.60  0.50 - 1.10 mg/dL    Calcium 9.0  8.4 - 45.4 mg/dL    Total Protein 7.7  6.0 - 8.3 g/dL    Albumin 3.7  3.5 - 5.2 g/dL    AST 31  0 - 37 U/L    ALT 33  0 - 35 U/L    Alkaline Phosphatase 54  39 - 117 U/L    Total Bilirubin 0.7  0.3 - 1.2 mg/dL    GFR calc non Af Amer 79 (*) >90 mL/min    GFR calc Af Amer >90  >90 mL/min   CBC     Status: Abnormal   Collection Time   07/01/12  3:57 AM      Component Value Range Comment   WBC 12.9 (*)  4.0 - 10.5 K/uL WHITE COUNT CONFIRMED ON SMEAR   RBC 4.73  3.87 - 5.11 MIL/uL    Hemoglobin 13.8  12.0 - 15.0 g/dL    HCT 09.8  11.9 - 14.7 %    MCV 91.1  78.0 - 100.0 fL    MCH 29.2  26.0 - 34.0 pg    MCHC 32.0  30.0 - 36.0 g/dL    RDW 82.9  56.2 - 13.0 %    Platelets 309  150 - 400 K/uL   PROTIME-INR     Status: Normal   Collection Time   07/01/12  3:57 AM      Component Value Range Comment   Prothrombin Time 13.2  11.6 - 15.2 seconds    INR 1.01  0.00 - 1.49   LACTATE DEHYDROGENASE     Status: Abnormal   Collection Time   07/01/12  3:57 AM      Component Value Range Comment   LDH 404 (*) 94 - 250 U/L SLIGHT HEMOLYSIS  GLUCOSE, CAPILLARY     Status: Normal   Collection Time   07/01/12  7:52 AM      Component Value Range Comment   Glucose-Capillary 94  70 - 99 mg/dL    Comment 1 Notify RN       Ct Abdomen Pelvis W Contrast  06/30/2012  *RADIOLOGY REPORT*  Clinical Data: Mid abdominal/back pain, history of recurrent carcinoid tumor  CT ABDOMEN AND PELVIS WITH CONTRAST  Technique:  Multidetector CT imaging of the abdomen and pelvis was performed following the standard protocol during bolus administration of intravenous contrast.  Contrast: 80mL OMNIPAQUE IOHEXOL 300 MG/ML  SOLN  Comparison: 02/24/2012  Findings: Moderate hepatic steatosis.  1.3 x 1.6 cm hyperenhancing lesion in the lateral segment left hepatic lobe (series 2/image 15), increased from 03/02/2010, favored to reflect a metastasis. Additional increased perfusion along the gallbladder fossa (series 2/images 17 and 24), favored to reflect focal fatty sparing. Focal hypoenhancing lesion  along the falciform ligament (series 2/image 20), likely reflecting focal fat.  Spleen and adrenal glands are within normal limits.  Pancreas is unremarkable.  Aside from jejunal mesenteric stranding (noted below), no definite peripancreatic inflammatory changes by CT to confirm a diagnosis of acute pancreatitis.  Gallbladder is unremarkable.   No intrahepatic or extrahepatic ductal dilatation.  3 mm nonobstructing left interpolar renal calculus (series 2/image 24).  Right kidney is normal limits.  No hydronephrosis.  No evidence of bowel obstruction.  1.5 x 1.5 cm node in the right jejunal mesentery (series 2/image 39), suspicious for metastasis, previously 1.6 x 1.1 cm although now with surrounding inflammatory changes and central necrosis.  Additional numerous small jejunal mesenteric nodes, slightly increased.  1.4 x 1.8 cm right external iliac node (series 2/image 55), previously 1.4 x 1.6 cm. 7 x 6 mm node anterior to the left sacrum (series 2/image 51), unchanged.  No abdominopelvic ascites.  4.3 x 4.7 cm enhancing mass along the right uterus (series 2/image 61), previously 3.7 x 4.3 cm, with irregular hyperenhancement.  1.3 x 2.0 cm enhancing mass along the posterior lower uterus (series 2/image 65), previously 1.4 x 1.8 cm, uniformly hyperenhancing. The dominant lesion is highly suspicious for pelvic metastasis given its appearance and interval growth.  While the smaller lesion could still reflect a fibroid, it is favored to reflect a second metastasis.  Bladder is within normal limits.  Mild degenerative changes of the visualized thoracolumbar spine.  IMPRESSION: Nodal metastases, as described above, including a 1.5 cm node in the right jejunal mesentery with interval central necrosis and surrounding inflammatory changes.  Otherwise, no definite peripancreatic inflammatory changes by CT.  Dominant 4.3 x 4.7 cm pelvic metastasis along the right uterus, increased.  Additional 1.3 x 2.0 cm mass along the posterior lower uterus also likely reflects a metastasis.  1.3 x 1.6 cm suspected metastasis in the lateral segment left hepatic lobe.   Original Report Authenticated By: Charline Bills, M.D.     Review of Systems  Constitutional: Negative for fever and chills.  Respiratory: Negative for cough and shortness of breath.   Cardiovascular:  Negative for chest pain.  Gastrointestinal: Positive for heartburn, nausea and abdominal pain. Negative for vomiting, diarrhea and constipation.       No flatus  Genitourinary: Negative for dysuria, urgency and frequency.  Musculoskeletal: Negative for myalgias.  Neurological: Positive for headaches. Negative for dizziness.   Blood pressure 151/90, pulse 104, temperature 98.1 F (36.7 C), temperature source Oral, resp. rate 22, height 5\' 1"  (1.549 m), weight 141 lb (63.957 kg), SpO2 98.00%. Physical Exam  Constitutional: Vanessa Waller is oriented to person, place, and time. Vanessa Waller appears well-developed and well-nourished. No distress.  HENT:  Head: Normocephalic and atraumatic.  Eyes: Pupils are equal, round, and reactive to light.  Neck: Normal range of motion. No tracheal deviation present.  Cardiovascular: Normal rate and regular rhythm.   Respiratory: Effort normal and breath sounds normal. No stridor.  GI: Soft. Vanessa Waller exhibits distension. Vanessa Waller exhibits no mass. There is no tenderness. There is no rebound and no guarding.  Musculoskeletal: Normal range of motion.  Neurological: Vanessa Waller is alert and oriented to person, place, and time.  Skin: Skin is warm and dry.    Assessment/Plan: I think there may be a role for surgical resection if it is felt that this will decrease Vanessa Waller symptoms.  Obviously if Vanessa Waller develops obstructive symptoms, we would also want to resect the bulky disease to help relieve these symptoms.  I  have recommended to Vanessa Waller to see how the diet goes, and we will continue to follow. I will discuss this with Dr Carolynne Edouard as well.  Kin Galbraith C. 07/01/2012, 3:36 PM

## 2012-07-01 NOTE — Progress Notes (Signed)
Nutrition Brief Note  Patient identified on the Malnutrition Screening Tool (MST) Report  Body mass index is 26.64 kg/(m^2). Pt meets criteria for overweight based on current BMI.   Current diet order is bland, patient reports tolerating bland diet and eating fair amount. Labs and medications reviewed. Pt reports eating well PTA with great appetite and stable weight. Pt denies any nausea today and reports her abdominal pain is improved.   No nutrition interventions warranted at this time. If nutrition issues arise, please consult RD.   Levon Hedger MS, RD, LDN [phone] Pager [phone] After Hours Pager

## 2012-07-01 NOTE — Telephone Encounter (Signed)
S/W ROBIN REGARDING THIS PT AND SHE STATED THAT THE PT IS IN THE HOSPITAL

## 2012-07-01 NOTE — Progress Notes (Signed)
[facility] PROGRESS NOTE  Vanessa Waller OZH:[id number] DOB: Oct 03, 1955 DOA: 06/30/2012 PCP: No primary provider on file. [facility] Oncologist: Dr. Arline Asp. Gastroenterologist: Dr. 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 Patient was sent from cancer center 06/30/12 for further evaluation of intractable abdominal pain and N/V. Of note, patient did have a workup for abdominal pain 06/28/2012. Workup revealed elevated lipase level at 175. Abdominal x-ray was done at that time and it showed no acute intra-abdominal findings. CT scans done on 06/30/2012 show metastatic lymphadenopathy.   Assessment/Plan: Principal problem:  Intractable abdominal pain in the setting of a past medical history of carcinoid tumor   Status post CT abdomen/pelvis which showed nodal metastasis. No bowel obstruction noted. We'll get formal oncology consultation.  Advance diet to clear liquids and follow.  Continue supportive care with IV fluids, antinausea medications and analgesia.  Lipase initially elevated. No elevation of lipase and amylase on repeat check to suggest ongoing pancreatitis. No evidence of pancreatic inflammation on CT scan. Active problems:  GERD  We'll start Protonix as needed. Left breast mass  Noted on mammogram done on 06/19/2012. Recommend compression views and possible ultrasound. Elevated liver enzymes thought to be secondary to hepatic steatosis   Patient did have previous workup for elevated liver enzymes, a workup done by Dr. Dickie La of GI. At that time it was determined that patient has hepatic steatosis.   Possible liver metastasis may also be contributory.  Code Status: full  code  Family Communication: Sister at bedside  Disposition Plan: Direct admission to medicine floor from [facility]    Medical Consultants:  Dr. Arline Asp, oncology.  Other Consultants:  None.  Anti-infectives:  None.  HPI/Subjective: Vanessa Waller continues to have intermittent sharp abdominal pain and a sensation of abdominal bloating. She continues to have episodes of nausea and vomiting. None so far today. Denies passing flatus or having any bowel movement.  Objective: Filed Vitals:   06/30/12 1239 06/30/12 2119  BP: 151/85 151/90  Pulse: 92 102  Temp: 98 F (36.7 C) 98.2 F (36.8 C)  TempSrc: Oral Oral  Resp: 20 20  Height: 5\' 1"  (1.549 m)   Weight: 63.957 kg (141 lb)   SpO2: 98% 97%    Intake/Output Summary (Last 24 hours) at 07/01/12 0735 Last data filed at 07/01/12 0207  Gross per 24 hour  Intake 381.25 ml  Output    500 ml  Net -118.75 ml    Exam: Gen:  NAD Cardiovascular:  RRR, No M/R/G Respiratory:  Lungs CTAB Gastrointestinal:  Abdomen soft, NT/ND, + BS Extremities:  No C/E/C  Data Reviewed: Basic Metabolic Panel:  Lab 07/01/12 6295 06/30/12 1508 06/28/12 1925  NA 135 134* 137  K 4.0 3.5 --  CL 99 95* 100  CO2 25 25 23   GLUCOSE 91 103* 125*  BUN 10 8 9   CREATININE 0.82 0.71 0.79  CALCIUM 9.0 9.6 9.8  MG -- 2.0 --  PHOS -- 2.5 --   GFR Estimated Creatinine Clearance: 66.4 ml/min (by C-G formula based on Cr of 0.82). Liver Function Tests:  Lab 07/01/12 0357 06/30/12 1508 06/28/12 1925  AST 31 34 49*  ALT 33 41* 56*  ALKPHOS 54 55 60  BILITOT 0.7 0.6 0.4  PROT 7.7  7.7 7.8  ALBUMIN 3.7 4.1 4.2    Lab 06/30/12 1508 06/28/12 1925  LIPASE 35 179*  AMYLASE 53 --   Coagulation profile  Lab 07/01/12 0357 06/30/12 1508  INR 1.01 1.02  PROTIME -- --    CBC:  Lab 07/01/12 0357 06/30/12 1508 06/28/12 1925  WBC 12.9* 10.6* 9.1  NEUTROABS -- 8.7* 6.8  HGB 13.8 13.8 14.3  HCT 43.1 41.7 42.7  MCV 91.1 88.3 87.1  PLT 309 311  306   Thyroid function studies  Basename 06/30/12 1508  TSH 1.271  T4TOTAL --  T3FREE --  THYROIDAB --   Microbiology No results found for this or any previous visit (from the past 240 hour(s)).   Procedures and Diagnostic Studies: Ct Abdomen Pelvis W Contrast  06/30/2012  *RADIOLOGY REPORT*  Clinical Data: Mid abdominal/back pain, history of recurrent carcinoid tumor  CT ABDOMEN AND PELVIS WITH CONTRAST  Technique:  Multidetector CT imaging of the abdomen and pelvis was performed following the standard protocol during bolus administration of intravenous contrast.  Contrast: 80mL OMNIPAQUE IOHEXOL 300 MG/ML  SOLN  Comparison: 02/24/2012  Findings: Moderate hepatic steatosis.  1.3 x 1.6 cm hyperenhancing lesion in the lateral segment left hepatic lobe (series 2/image 15), increased from 03/02/2010, favored to reflect a metastasis. Additional increased perfusion along the gallbladder fossa (series 2/images 17 and 24), favored to reflect focal fatty sparing. Focal hypoenhancing lesion along the falciform ligament (series 2/image 20), likely reflecting focal fat.  Spleen and adrenal glands are within normal limits.  Pancreas is unremarkable.  Aside from jejunal mesenteric stranding (noted below), no definite peripancreatic inflammatory changes by CT to confirm a diagnosis of acute pancreatitis.  Gallbladder is unremarkable.  No intrahepatic or extrahepatic ductal dilatation.  3 mm nonobstructing left interpolar renal calculus (series 2/image 24).  Right kidney is normal limits.  No hydronephrosis.  No evidence of bowel obstruction.  1.5 x 1.5 cm node in the right jejunal mesentery (series 2/image 39), suspicious for metastasis, previously 1.6 x 1.1 cm although now with surrounding inflammatory changes and central necrosis.  Additional numerous small jejunal mesenteric nodes, slightly increased.  1.4 x 1.8 cm right external iliac node (series 2/image 55), previously 1.4 x 1.6 cm. 7 x 6 mm node anterior  to the left sacrum (series 2/image 51), unchanged.  No abdominopelvic ascites.  4.3 x 4.7 cm enhancing mass along the right uterus (series 2/image 61), previously 3.7 x 4.3 cm, with irregular hyperenhancement.  1.3 x 2.0 cm enhancing mass along the posterior lower uterus (series 2/image 65), previously 1.4 x 1.8 cm, uniformly hyperenhancing. The dominant lesion is highly suspicious for pelvic metastasis given its appearance and interval growth.  While the smaller lesion could still reflect a fibroid, it is favored to reflect a second metastasis.  Bladder is within normal limits.  Mild degenerative changes of the visualized thoracolumbar spine.  IMPRESSION: Nodal metastases, as described above, including a 1.5 cm node in the right jejunal mesentery with interval central necrosis and surrounding inflammatory changes.  Otherwise, no definite peripancreatic inflammatory changes by CT.  Dominant 4.3 x 4.7 cm pelvic metastasis along the right uterus, increased.  Additional 1.3 x 2.0 cm mass along the posterior lower uterus also likely reflects a metastasis.  1.3 x 1.6 cm suspected metastasis in the lateral segment left hepatic lobe.   Original Report Authenticated By: Charline Bills, M.D.    Dg Abd Acute W/chest  06/28/2012  *RADIOLOGY REPORT*  Clinical Data: Abdominal pain 1 day.  ACUTE ABDOMEN SERIES (ABDOMEN 2 VIEW & CHEST 1 VIEW)  Comparison: CT abdomen/pelvis 02/24/2012  Findings: Lungs are clear.  Cardiomediastinal silhouette and remainder of the chest is within normal.  Abdominal pelvic images demonstrate a nonobstructive bowel gas pattern.  There is no mass or pathologic calcifications present. Bones and soft tissues are otherwise unremarkable.  IMPRESSION: No acute findings.   Original Report Authenticated By: Elberta Fortis, M.D.    Mm Digital Screening  06/19/2012  *RADIOLOGY REPORT*  Clinical Data: Screening.  DIGITAL BILATERAL SCREENING MAMMOGRAM WITH CAD  Comparison:  Previous exams.  FINDINGS:   Breast Density: ACR Category 3 The breast tissue is heterogeneously dense.  In the left breast, a possible mass warrants further evaluation with spot compression views and possibly ultrasound.  In the right breast, no masses or malignant type calcifications are identified.  Images were processed with CAD.  IMPRESSION: Further evaluation is suggested for possible mass in the left breast.  RECOMMENDATION: Diagnostic mammogram and possibly ultrasound of the left breast. (Code:FI-L-29M)  BI-RADS CATEGORY 0:  Incomplete.  Need additional imaging evaluation and/or prior mammograms for comparison.   Original Report Authenticated By: Norva Pavlov, M.D.     Scheduled Meds:   . aspirin EC  81 mg Oral Daily  . cholecalciferol  1,000 Units Oral Daily  . docusate sodium  100 mg Oral BID  . multivitamin with minerals  1 tablet Oral Daily  . pantoprazole  40 mg Oral Daily  . ascorbic acid  500 mg Oral Daily   Continuous Infusions:   . sodium chloride 75 mL/hr at 07/01/12 1610    Time spent: 40 minutes.   LOS: 1 day   RAMA,CHRISTINA  [facility] Pager [phone].  If 8PM-8AM, please contact night-coverage at [email or web address], password [facility] 07/01/2012, 7:35 AM

## 2012-07-02 ENCOUNTER — Encounter: Payer: Self-pay | Admitting: Oncology

## 2012-07-02 ENCOUNTER — Other Ambulatory Visit: Payer: Self-pay

## 2012-07-02 MED ORDER — CALCIUM CARBONATE ANTACID 500 MG PO CHEW
400.0000 mg | CHEWABLE_TABLET | Freq: Three times a day (TID) | ORAL | Status: DC | PRN
  Filled 2012-07-02: qty 2

## 2012-07-02 MED ORDER — BISACODYL 10 MG RE SUPP
10.0000 mg | Freq: Once | RECTAL | Status: AC
  Administered 2012-07-02: 10 mg via RECTAL
  Filled 2012-07-02: qty 1

## 2012-07-02 NOTE — Progress Notes (Unsigned)
This patient was admitted to the hospital on Tuesday, 06/30/2012. CT scan of the abdomen and pelvis with IV contrast on that date showed progression of her disease. I don't think she is going to be a surgical candidate.  I spoke with Dr. [name] from [facility] today. He recommends starting her on treatment with somatostatin LAR 30 mg every month. If she progresses then she may be a candidate for clinical trials.  One regimen that Dr. Lattie Corns suggested was Temodar 150 mg per meter squared as a daily dose given in a twice a day schedule on days 10-14 of each treatment cycle, along with Xeloda 600 mg per meter squared twice daily given days 1 through 14 (maximum dose of 1000 mg twice daily). There was a comment about pneumocystis prophylaxis in up-to-date.  Other treatment options might include Xeloda and oxaliplatin, FOLFOX with or without Avastin, Adriamycin, streptozocin, Xeloda with Avastin, Sutent, sorafenib, panzopanib and afinitor.   The patient needs to referred to Dr. Lattie Corns.  We can go ahead and start the somatostatin.

## 2012-07-02 NOTE — Progress Notes (Signed)
[facility] PROGRESS NOTE  JESSICIA Waller GEX:[id number] DOB: 1956/03/10 DOA: 06/30/2012 PCP: No primary provider on file. [facility] Oncologist: Dr. Arline Asp. Gastroenterologist: Dr. 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 Patient was sent from cancer center 06/30/12 for further evaluation of intractable abdominal pain and N/V. Of note, patient did have a workup for abdominal pain 06/28/2012. Workup revealed elevated lipase level at 175. Abdominal x-ray was done at that time and it showed no acute intra-abdominal findings. CT scans done on 06/30/2012 show metastatic lymphadenopathy.   Assessment/Plan: Principal problem:  Intractable abdominal pain in the setting of a past medical history of carcinoid tumor   Status post CT abdomen/pelvis which showed nodal metastasis. No bowel obstruction noted. Seen by Dr. Arline Asp 07/01/12.  Thinks pain may be from necrotic lymphadenopathy.  Tolerating bland diet, started yesterday.  Continue supportive care with IV fluids, antinausea medications and analgesia.  Lipase initially elevated. No elevation of lipase and amylase on repeat check to suggest ongoing pancreatitis. No evidence of pancreatic inflammation on CT scan. Active problems:  GERD  Continue Tums, as needed.  Refuses Protonix. Left breast mass  Noted on mammogram done on 06/19/2012. Recommend compression views and possible ultrasound. Elevated liver enzymes thought to be secondary to hepatic steatosis   Patient did have previous workup for elevated liver enzymes, a workup done by Dr. Dickie La of GI. At that time it was determined that patient has hepatic steatosis.   Possible liver  metastasis may also be contributory.  Code Status: full code  Family Communication: Sister at bedside  Disposition Plan: Home when stable.    Medical Consultants:  Dr. Arline Asp, oncology.  Dr. Romie Levee, Surgery.  Other Consultants:  None.  Anti-infectives:  None.  HPI/Subjective: [name] is passing flatus, but still has not moved her bowels.  Less pain.  No N/V.  Objective: Filed Vitals:   07/01/12 1345 07/01/12 2127 07/02/12 0619 07/02/12 1329  BP: 151/90 139/79 163/90 151/78  Pulse: 104 94 98 100  Temp: 98.1 F (36.7 C) 99.1 F (37.3 C) 97.8 F (36.6 C) 97.9 F (36.6 C)  TempSrc: Oral Oral Oral   Resp: 22 18 18 18   Height:      Weight:      SpO2: 98% 96% 100% 100%    Intake/Output Summary (Last 24 hours) at 07/02/12 1517 Last data filed at 07/02/12 1300  Gross per 24 hour  Intake   3809 ml  Output    200 ml  Net   3609 ml    Exam: Gen:  NAD Cardiovascular:  RRR, No M/R/G Respiratory:  Lungs CTAB Gastrointestinal:  Abdomen soft, NT/ND, + BS Extremities:  No C/E/C  Data Reviewed: Basic Metabolic Panel:  Lab 07/01/12 0102 06/30/12 1508 06/28/12 1925  NA 135 134* 137  K 4.0 3.5 --  CL 99 95* 100  CO2 25 25 23   GLUCOSE 91 103* 125*  BUN 10 8 9   CREATININE 0.82 0.71 0.79  CALCIUM 9.0 9.6 9.8  MG -- 2.0 --  PHOS -- 2.5 --   GFR Estimated Creatinine Clearance: 66.4 ml/min (by C-G formula based on Cr of 0.82). Liver Function Tests:  Lab 07/01/12 0357 06/30/12 1508 06/28/12 1925  AST 31 34 49*  ALT 33 41*  56*  ALKPHOS 54 55 60  BILITOT 0.7 0.6 0.4  PROT 7.7 7.7 7.8  ALBUMIN 3.7 4.1 4.2    Lab 06/30/12 1508 06/28/12 1925  LIPASE 35 179*  AMYLASE 53 --   Coagulation profile  Lab 07/01/12 0357 06/30/12 1508  INR 1.01 1.02  PROTIME -- --    CBC:  Lab 07/01/12 0357 06/30/12 1508 06/28/12 1925  WBC 12.9* 10.6* 9.1  NEUTROABS -- 8.7* 6.8  HGB 13.8 13.8 14.3  HCT 43.1 41.7 42.7  MCV 91.1 88.3 87.1  PLT 309 311 306    Thyroid function studies  Basename 06/30/12 1508  TSH 1.271  T4TOTAL --  T3FREE --  THYROIDAB --   Microbiology No results found for this or any previous visit (from the past 240 hour(s)).   Procedures and Diagnostic Studies: Ct Abdomen Pelvis W Contrast  06/30/2012  *RADIOLOGY REPORT*  Clinical Data: Mid abdominal/back pain, history of recurrent carcinoid tumor  CT ABDOMEN AND PELVIS WITH CONTRAST  Technique:  Multidetector CT imaging of the abdomen and pelvis was performed following the standard protocol during bolus administration of intravenous contrast.  Contrast: 80mL OMNIPAQUE IOHEXOL 300 MG/ML  SOLN  Comparison: 02/24/2012  Findings: Moderate hepatic steatosis.  1.3 x 1.6 cm hyperenhancing lesion in the lateral segment left hepatic lobe (series 2/image 15), increased from 03/02/2010, favored to reflect a metastasis. Additional increased perfusion along the gallbladder fossa (series 2/images 17 and 24), favored to reflect focal fatty sparing. Focal hypoenhancing lesion along the falciform ligament (series 2/image 20), likely reflecting focal fat.  Spleen and adrenal glands are within normal limits.  Pancreas is unremarkable.  Aside from jejunal mesenteric stranding (noted below), no definite peripancreatic inflammatory changes by CT to confirm a diagnosis of acute pancreatitis.  Gallbladder is unremarkable.  No intrahepatic or extrahepatic ductal dilatation.  3 mm nonobstructing left interpolar renal calculus (series 2/image 24).  Right kidney is normal limits.  No hydronephrosis.  No evidence of bowel obstruction.  1.5 x 1.5 cm node in the right jejunal mesentery (series 2/image 39), suspicious for metastasis, previously 1.6 x 1.1 cm although now with surrounding inflammatory changes and central necrosis.  Additional numerous small jejunal mesenteric nodes, slightly increased.  1.4 x 1.8 cm right external iliac node (series 2/image 55), previously 1.4 x 1.6 cm. 7 x 6 mm node anterior to  the left sacrum (series 2/image 51), unchanged.  No abdominopelvic ascites.  4.3 x 4.7 cm enhancing mass along the right uterus (series 2/image 61), previously 3.7 x 4.3 cm, with irregular hyperenhancement.  1.3 x 2.0 cm enhancing mass along the posterior lower uterus (series 2/image 65), previously 1.4 x 1.8 cm, uniformly hyperenhancing. The dominant lesion is highly suspicious for pelvic metastasis given its appearance and interval growth.  While the smaller lesion could still reflect a fibroid, it is favored to reflect a second metastasis.  Bladder is within normal limits.  Mild degenerative changes of the visualized thoracolumbar spine.  IMPRESSION: Nodal metastases, as described above, including a 1.5 cm node in the right jejunal mesentery with interval central necrosis and surrounding inflammatory changes.  Otherwise, no definite peripancreatic inflammatory changes by CT.  Dominant 4.3 x 4.7 cm pelvic metastasis along the right uterus, increased.  Additional 1.3 x 2.0 cm mass along the posterior lower uterus also likely reflects a metastasis.  1.3 x 1.6 cm suspected metastasis in the lateral segment left hepatic lobe.   Original Report Authenticated By: Charline Bills, M.D.    Dg Abd Acute  W/chest  06/28/2012  *RADIOLOGY REPORT*  Clinical Data: Abdominal pain 1 day.  ACUTE ABDOMEN SERIES (ABDOMEN 2 VIEW & CHEST 1 VIEW)  Comparison: CT abdomen/pelvis 02/24/2012  Findings: Lungs are clear.  Cardiomediastinal silhouette and remainder of the chest is within normal.  Abdominal pelvic images demonstrate a nonobstructive bowel gas pattern.  There is no mass or pathologic calcifications present. Bones and soft tissues are otherwise unremarkable.  IMPRESSION: No acute findings.   Original Report Authenticated By: Elberta Fortis, M.D.    Mm Digital Screening  06/19/2012  *RADIOLOGY REPORT*  Clinical Data: Screening.  DIGITAL BILATERAL SCREENING MAMMOGRAM WITH CAD  Comparison:  Previous exams.  FINDINGS:  Breast  Density: ACR Category 3 The breast tissue is heterogeneously dense.  In the left breast, a possible mass warrants further evaluation with spot compression views and possibly ultrasound.  In the right breast, no masses or malignant type calcifications are identified.  Images were processed with CAD.  IMPRESSION: Further evaluation is suggested for possible mass in the left breast.  RECOMMENDATION: Diagnostic mammogram and possibly ultrasound of the left breast. (Code:FI-L-72M)  BI-RADS CATEGORY 0:  Incomplete.  Need additional imaging evaluation and/or prior mammograms for comparison.   Original Report Authenticated By: Norva Pavlov, M.D.     Scheduled Meds:    . aspirin EC  81 mg Oral Daily  . cholecalciferol  1,000 Units Oral Daily  . docusate sodium  100 mg Oral BID  . metoCLOPramide (REGLAN) injection  5 mg Intravenous TID AC & HS  . multivitamin with minerals  1 tablet Oral Daily  . OxyCODONE  15 mg Oral Q12H  . ascorbic acid  500 mg Oral Daily   Continuous Infusions:   Time spent: 35 minutes.   LOS: 2 days   Mirela Parsley  [facility] Pager [phone].  If 8PM-8AM, please contact night-coverage at [email or web address], password [facility] 07/02/2012, 3:17 PM

## 2012-07-02 NOTE — Progress Notes (Signed)
Abdominal pain  Subjective: Pt states that she has had some flatus but no BM's yet.  No nausea.  Still having RLQ pain.  Tolerating a diet.  Objective: Vital signs in last 24 hours: Temp:  [97.8 F (36.6 C)-99.1 F (37.3 C)] 97.8 F (36.6 C) (12/19 0619) Pulse Rate:  [94-104] 98  (12/19 0619) Resp:  [18-22] 18  (12/19 0619) BP: (139-163)/(79-90) 163/90 mmHg (12/19 0619) SpO2:  [96 %-100 %] 100 % (12/19 0619) Last BM Date: 06/28/12  Intake/Output from previous day: 12/18 0701 - 12/19 0700 In: 3669 [P.O.:460; I.V.:3209] Out: 700 [Urine:700] Intake/Output this shift: Total I/O In: 360 [P.O.:360] Out: -   General appearance: alert and cooperative GI: soft, mild tenderness in RLQ  Lab Results:  No results found for this or any previous visit (from the past 24 hour(s)).   Studies/Results Radiology     MEDS, Scheduled    . aspirin EC  81 mg Oral Daily  . cholecalciferol  1,000 Units Oral Daily  . docusate sodium  100 mg Oral BID  . metoCLOPramide (REGLAN) injection  5 mg Intravenous TID AC & HS  . multivitamin with minerals  1 tablet Oral Daily  . OxyCODONE  15 mg Oral Q12H  . ascorbic acid  500 mg Oral Daily     Assessment: Abdominal pain Pt acting like partial SBO but nothing showing up on CT   Plan: Cont diet, Agree with Miralax.  If she has a BM, I think she could go home and complete workup as an outpatient for scheduled surgery in Jan.  I think it would be reasonable to resect bowel in surgery if it looks like this may be causing her symptoms.  If her bowel symptoms do not resolve, she may need a surgery sooner rather than later.  LOS: 2 days    Vanita Panda, MD [facility], [place] [phone]   07/02/2012 11:02 AM

## 2012-07-03 ENCOUNTER — Telehealth: Payer: Self-pay | Admitting: Oncology

## 2012-07-03 ENCOUNTER — Other Ambulatory Visit: Payer: Self-pay

## 2012-07-03 ENCOUNTER — Telehealth: Payer: Self-pay

## 2012-07-03 DIAGNOSIS — D3A012 Benign carcinoid tumor of the ileum: Secondary | ICD-10-CM

## 2012-07-03 MED ORDER — OXYCODONE HCL ER 15 MG PO T12A: 15.0000 mg | EXTENDED_RELEASE_TABLET | Freq: Two times a day (BID) | ORAL | Status: DC

## 2012-07-03 MED ORDER — DSS 100 MG PO CAPS: 100.0000 mg | ORAL_CAPSULE | Freq: Two times a day (BID) | ORAL | Status: DC

## 2012-07-03 MED ORDER — POLYETHYLENE GLYCOL 3350 17 G PO PACK: 17.0000 g | PACK | Freq: Every day | ORAL | Status: DC | PRN

## 2012-07-03 NOTE — Progress Notes (Signed)
Pt ready for d/c. Went over d/c instructions. Pt has follow-up appointment scheduled for next Tuesday. Instructed on where to pick up new prescriptions. Removed PIV, WNL. No change in pt condition since this AM assessment. Pt d/c'd to home with sister.

## 2012-07-03 NOTE — Discharge Summary (Addendum)
Physician Discharge Summary  Vanessa Waller NGE:[id number] DOB: 12-14-1955 DOA: 06/30/2012  PCP: Lovenia Kim, PA  Admit date: 06/30/2012 Discharge date: 07/03/2012  Recommendations for Outpatient Follow-up:  1. F/U with Dr. Arline Asp who will refer the Vanessa Waller to  Dr. [name] at [facility]. 2. Needs outpatient followup of abnormal mammography findings.  Discharge Diagnoses:  Principal Problem:  *Abdominal pain Active Problems:   TRANSAMINASES, SERUM, ELEVATED   Carcinoid tumor of ileum   Hepatic steatosis   Left breast mass   GERD (gastroesophageal reflux disease)   Discharge Condition: Improved.  Diet recommendation: Regular as tolerated.  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 Vanessa Waller was sent from cancer center 06/30/12 for further evaluation of intractable abdominal pain and N/V. Of note, Vanessa Waller did have a workup for abdominal pain 06/28/2012. Workup revealed elevated lipase level at 175. Abdominal x-ray was done at that time and it showed no acute intra-abdominal findings. CT scans done on 06/30/2012 show metastatic lymphadenopathy.  Hospital Course by problem:  Principal problem:  Intractable abdominal pain in the setting of a past medical history of carcinoid tumor  Status post CT abdomen/pelvis which showed nodal metastasis. No bowel obstruction noted. Seen by Dr. Arline Asp daily while in the hospital. Thinks pain may be from necrotic lymphadenopathy.  Tolerated bland diet for 48 hours prior to discharge.  Continue supportive care with IV fluids, antinausea medications and analgesia.  Lipase initially elevated. No elevation of lipase and amylase on repeat check to  suggest ongoing pancreatitis. No evidence of pancreatic inflammation on CT scan. Active problems:  GERD  Continue Tums, as needed.  Left breast mass  Noted on mammogram done on 06/19/2012. Recommend compression views and possible ultrasound. Elevated liver enzymes thought to be secondary to hepatic steatosis  Vanessa Waller did have previous workup for elevated liver enzymes, a workup done by Dr. Dickie La of GI. At that time it was determined that Vanessa Waller has hepatic steatosis.  Possible liver metastasis may also be contributory.   Procedures:  None.  Consultations:  Dr. Kimberlee Nearing, Oncology  Dr. Romie Levee, Surgery  Discharge Exam: Filed Vitals:   07/03/12 0453  BP: 145/89  Pulse: 93  Temp: 98 F (36.7 C)  Resp: 16   Filed Vitals:   07/02/12 0619 07/02/12 1329 07/02/12 2222 07/03/12 0453  BP: 163/90 151/78 137/80 145/89  Pulse: 98 100 101 93  Temp: 97.8 F (36.6 C) 97.9 F (36.6 C) 98.5 F (36.9 C) 98 F (36.7 C)  TempSrc: Oral  Oral Oral  Resp: 18 18 16 16   Height:      Weight:      SpO2: 100% 100% 100% 99%    Gen:  NAD Cardiovascular:  RRR, No M/R/G Respiratory: Lungs CTAB Gastrointestinal: Abdomen soft, NT/ND with normal active bowel sounds. Extremities: No C/E/C   Discharge Instructions      Discharge Orders    Future Appointments: Provider: Department: Dept Phone: Center:   07/07/2012 10:30 AM Radene Gunning [facility] MEDICAL ONCOLOGY [phone] None   07/07/2012 11:00 AM Samul Dada, MD [facility] MEDICAL ONCOLOGY [phone] None   07/22/2012 4:30 PM Robyne Askew, MD [facility] Surgery, [place] [phone] None   07/29/2012 3:30 PM Jeannette Corpus, MD  [facility] GYNECOLOGICAL ONCOLOGY [phone] None   09/21/2012 3:30 PM Radene Gunning [facility] MEDICAL ONCOLOGY [phone] None   09/21/2012 4:00 PM Samul Dada, MD [facility] MEDICAL ONCOLOGY  [phone] None     Future Orders Please Complete By Expires   Diet general      Increase activity slowly      Call MD for:  temperature >100.4      Call MD for:  persistant nausea and vomiting      Call MD for:  severe uncontrolled pain          Medication List     As of 07/03/2012 10:42 AM    TAKE these medications         ascorbic acid 500 MG tablet   Commonly known as: VITAMIN C   Take 500 mg by mouth daily.      aspirin 81 MG tablet   Take 81 mg by mouth daily.      CALCIUM 600-D PO   Take 1 tablet by mouth daily.      DSS 100 MG Caps   Take 100 mg by mouth 2 (two) times daily.      ibuprofen 200 MG tablet   Commonly known as: ADVIL,MOTRIN   Take 400 mg by mouth every 6 (six) hours as needed. For abdominal pain.      multivitamin tablet   Take 1 tablet by mouth daily.      ondansetron 8 MG disintegrating tablet   Commonly known as: ZOFRAN-ODT   Take 1 tablet (8 mg total) by mouth every 8 (eight) hours as needed for nausea.      OxyCODONE 15 mg T12a   Commonly known as: OXYCONTIN   Take 1 tablet (15 mg total) by mouth every 12 (twelve) hours.      oxyCODONE-acetaminophen 5-325 MG per tablet   Commonly known as: PERCOCET/ROXICET   Take 1-2 tablets by mouth every 6 (six) hours as needed for pain.      polyethylene glycol packet   Commonly known as: MIRALAX / GLYCOLAX   Take 17 g by mouth daily as needed.      Vitamin D3 1000 UNITS Caps   Take 1 capsule by mouth daily.        Follow-up Information    Follow up with Samul Dada, MD. (At appt times noted below)    Contact information:   [address] [place] [address] [phone]       Follow up with F/U further evaluation of abnormal mammogram. (Call radiology dept where you had your mammogram and have them schedule you for further evaluation)           The results of significant diagnostics from this hospitalization (including imaging, microbiology, ancillary and laboratory)  are listed below for reference.    Significant Diagnostic Studies: Ct Abdomen Pelvis W Contrast  06/30/2012  *RADIOLOGY REPORT*  Clinical Data: Mid abdominal/back pain, history of recurrent carcinoid tumor  CT ABDOMEN AND PELVIS WITH CONTRAST  Technique:  Multidetector CT imaging of the abdomen and pelvis was performed following the standard protocol during bolus administration of intravenous contrast.  Contrast: 80mL OMNIPAQUE IOHEXOL 300 MG/ML  SOLN  Comparison: 02/24/2012  Findings: Moderate hepatic steatosis.  1.3 x 1.6 cm hyperenhancing lesion in the lateral segment left hepatic lobe (series 2/image 15), increased from 03/02/2010, favored to reflect a metastasis. Additional increased perfusion along the gallbladder fossa (series 2/images 17 and  24), favored to reflect focal fatty sparing. Focal hypoenhancing lesion along the falciform ligament (series 2/image 20), likely reflecting focal fat.  Spleen and adrenal glands are within normal limits.  Pancreas is unremarkable.  Aside from jejunal mesenteric stranding (noted below), no definite peripancreatic inflammatory changes by CT to confirm a diagnosis of acute pancreatitis.  Gallbladder is unremarkable.  No intrahepatic or extrahepatic ductal dilatation.  3 mm nonobstructing left interpolar renal calculus (series 2/image 24).  Right kidney is normal limits.  No hydronephrosis.  No evidence of bowel obstruction.  1.5 x 1.5 cm node in the right jejunal mesentery (series 2/image 39), suspicious for metastasis, previously 1.6 x 1.1 cm although now with surrounding inflammatory changes and central necrosis.  Additional numerous small jejunal mesenteric nodes, slightly increased.  1.4 x 1.8 cm right external iliac node (series 2/image 55), previously 1.4 x 1.6 cm. 7 x 6 mm node anterior to the left sacrum (series 2/image 51), unchanged.  No abdominopelvic ascites.  4.3 x 4.7 cm enhancing mass along the right uterus (series 2/image 61), previously 3.7 x 4.3 cm,  with irregular hyperenhancement.  1.3 x 2.0 cm enhancing mass along the posterior lower uterus (series 2/image 65), previously 1.4 x 1.8 cm, uniformly hyperenhancing. The dominant lesion is highly suspicious for pelvic metastasis given its appearance and interval growth.  While the smaller lesion could still reflect a fibroid, it is favored to reflect a second metastasis.  Bladder is within normal limits.  Mild degenerative changes of the visualized thoracolumbar spine.  IMPRESSION: Nodal metastases, as described above, including a 1.5 cm node in the right jejunal mesentery with interval central necrosis and surrounding inflammatory changes.  Otherwise, no definite peripancreatic inflammatory changes by CT.  Dominant 4.3 x 4.7 cm pelvic metastasis along the right uterus, increased.  Additional 1.3 x 2.0 cm mass along the posterior lower uterus also likely reflects a metastasis.  1.3 x 1.6 cm suspected metastasis in the lateral segment left hepatic lobe.   Original Report Authenticated By: Charline Bills, M.D.    Dg Abd Acute W/chest  06/28/2012  *RADIOLOGY REPORT*  Clinical Data: Abdominal pain 1 day.  ACUTE ABDOMEN SERIES (ABDOMEN 2 VIEW & CHEST 1 VIEW)  Comparison: CT abdomen/pelvis 02/24/2012  Findings: Lungs are clear.  Cardiomediastinal silhouette and remainder of the chest is within normal.  Abdominal pelvic images demonstrate a nonobstructive bowel gas pattern.  There is no mass or pathologic calcifications present. Bones and soft tissues are otherwise unremarkable.  IMPRESSION: No acute findings.   Original Report Authenticated By: Elberta Fortis, M.D.    Mm Digital Screening  06/19/2012  *RADIOLOGY REPORT*  Clinical Data: Screening.  DIGITAL BILATERAL SCREENING MAMMOGRAM WITH CAD  Comparison:  Previous exams.  FINDINGS:  Breast Density: ACR Category 3 The breast tissue is heterogeneously dense.  In the left breast, a possible mass warrants further evaluation with spot compression views and possibly  ultrasound.  In the right breast, no masses or malignant type calcifications are identified.  Images were processed with CAD.  IMPRESSION: Further evaluation is suggested for possible mass in the left breast.  RECOMMENDATION: Diagnostic mammogram and possibly ultrasound of the left breast. (Code:FI-L-3M)  BI-RADS CATEGORY 0:  Incomplete.  Need additional imaging evaluation and/or prior mammograms for comparison.   Original Report Authenticated By: Norva Pavlov, M.D.     Microbiology: No results found for this or any previous visit (from the past 240 hour(s)).   Labs: Basic Metabolic Panel:  Lab 07/01/12 8295 06/30/12 1508 06/28/12  1925  NA 135 134* 137  K 4.0 3.5 3.8  CL 99 95* 100  CO2 25 25 23   GLUCOSE 91 103* 125*  BUN 10 8 9   CREATININE 0.82 0.71 0.79  CALCIUM 9.0 9.6 9.8  MG -- 2.0 --  PHOS -- 2.5 --   Liver Function Tests:  Lab 07/01/12 0357 06/30/12 1508 06/28/12 1925  AST 31 34 49*  ALT 33 41* 56*  ALKPHOS 54 55 60  BILITOT 0.7 0.6 0.4  PROT 7.7 7.7 7.8  ALBUMIN 3.7 4.1 4.2    Lab 06/30/12 1508 06/28/12 1925  LIPASE 35 179*  AMYLASE 53 --   CBC:  Lab 07/01/12 0357 06/30/12 1508 06/28/12 1925  WBC 12.9* 10.6* 9.1  NEUTROABS -- 8.7* 6.8  HGB 13.8 13.8 14.3  HCT 43.1 41.7 42.7  MCV 91.1 88.3 87.1  PLT 309 311 306   CBG:  Lab 07/03/12 0802 07/01/12 0752  GLUCAP 141* 94    Time coordinating discharge: 35 minutes.  Signed:  Amyrah Pinkhasov  Pager [phone] [facility] 07/03/2012, 10:42 AM

## 2012-07-03 NOTE — Telephone Encounter (Signed)
l/m with 12/24 appt info    Vanessa Waller

## 2012-07-03 NOTE — Telephone Encounter (Signed)
Sister called and stated pt should be discharged from hospital today. Pt will be staying with [name] for awhile and her phone # is [phone]. I confirmed f/u appt 12/24 at 1030.

## 2012-07-03 NOTE — Telephone Encounter (Signed)
LM FOR KATHY B TO PUT THIS IN AS A REF. AND THAT TIFFANY IN HIM WOULD TAKE CARE OF THIS REF.    ANNE

## 2012-07-06 ENCOUNTER — Other Ambulatory Visit: Payer: Self-pay | Admitting: Family

## 2012-07-06 DIAGNOSIS — D3A012 Benign carcinoid tumor of the ileum: Secondary | ICD-10-CM

## 2012-07-07 ENCOUNTER — Other Ambulatory Visit (HOSPITAL_BASED_OUTPATIENT_CLINIC_OR_DEPARTMENT_OTHER): Payer: 59 | Admitting: Lab

## 2012-07-07 ENCOUNTER — Ambulatory Visit (HOSPITAL_BASED_OUTPATIENT_CLINIC_OR_DEPARTMENT_OTHER): Payer: 59 | Admitting: Oncology

## 2012-07-07 ENCOUNTER — Encounter: Payer: Self-pay | Admitting: Oncology

## 2012-07-07 ENCOUNTER — Telehealth: Payer: Self-pay | Admitting: Oncology

## 2012-07-07 VITALS — BP 139/83 | HR 82 | Temp 97.7°F | Resp 20 | Ht 61.0 in | Wt 139.4 lb

## 2012-07-07 DIAGNOSIS — C7A012 Malignant carcinoid tumor of the ileum: Secondary | ICD-10-CM

## 2012-07-07 DIAGNOSIS — D3A012 Benign carcinoid tumor of the ileum: Secondary | ICD-10-CM

## 2012-07-07 LAB — CBC WITH DIFFERENTIAL/PLATELET
Basophils Absolute: 0 10*3/uL (ref 0.0–0.1)
Eosinophils Absolute: 0 10*3/uL (ref 0.0–0.5)
LYMPH%: 31.6 % (ref 14.0–49.7)
MCV: 89.3 fL (ref 79.5–101.0)
MONO%: 8.6 % (ref 0.0–14.0)
NEUT#: 2.5 10*3/uL (ref 1.5–6.5)
Platelets: 323 10*3/uL (ref 145–400)
RBC: 4.71 10*6/uL (ref 3.70–5.45)

## 2012-07-07 LAB — COMPREHENSIVE METABOLIC PANEL (CC13)
Albumin: 3.8 g/dL (ref 3.5–5.0)
BUN: 13 mg/dL (ref 7.0–26.0)
CO2: 25 mEq/L (ref 22–29)
Calcium: 9.8 mg/dL (ref 8.4–10.4)
Chloride: 102 mEq/L (ref 98–107)
Glucose: 95 mg/dl (ref 70–99)
Potassium: 4.5 mEq/L (ref 3.5–5.1)

## 2012-07-07 LAB — LACTATE DEHYDROGENASE (CC13): LDH: 276 U/L — ABNORMAL HIGH (ref 125–245)

## 2012-07-07 MED ORDER — OCTREOTIDE ACETATE 30 MG IM KIT
30.0000 mg | PACK | Freq: Once | INTRAMUSCULAR | Status: AC
  Administered 2012-07-07: 30 mg via INTRAMUSCULAR
  Filled 2012-07-07: qty 1

## 2012-07-07 NOTE — Progress Notes (Signed)
This office note has been dictated.  #[id number]

## 2012-07-07 NOTE — Telephone Encounter (Signed)
l/vm at [phone] per pt with the appt info for 1/24      anne

## 2012-07-09 ENCOUNTER — Telehealth: Payer: Self-pay

## 2012-07-09 NOTE — Telephone Encounter (Signed)
Pt made appt w/Dr [name] for Jan 8 9am. She requested we get her scans ready on disc to take with her. Radiology called with request. Referral information to medical records to get her records sent.

## 2012-07-09 NOTE — Telephone Encounter (Signed)
S/w pt that she can call Dr Moshe Salisbury office to schedule her appt, #[phone],  S/w Dr Moshe Salisbury office prior to this call with referral information

## 2012-07-09 NOTE — Progress Notes (Signed)
CC:   [name]. [name], MD Excell Seltzer. [name], M.D. [name], MD [name]. [name], M.D. [facility], [place], [place] [phone] [name], MD  PROBLEM LIST: 1. 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  The patient underwent surgical resection of her     carcinoid tumor on 08/17/2005.  Two out of 13 lymph nodes were     positive.  The patient's stage was III.  The patient was noted to     have an elevated urinary 5HIAA level in July 2011.  A positive     octreotide scan was noted coming from the right lower pelvis     adjacent to the bladder on 09/06/2010.  Initially this was felt to     be a uterine fibroid.  The patient has continued to have positive     octreotide scan on 11/07/2011 and 05/06/2012.  CT scans of the     abdomen and pelvis were also carried out on 03/02/2010, 02/24/2012     and 06/30/2012.  These scans in conjunction with an elevated 5HIAA     level of 22.7 on 04/13/2012 and a chromogranin A level which had     risen to 18.0 on 03/23/2012 strongly suggested that the patient's     disease had recurred.  The patient required admission to the     hospital from 06/30/2012 through 07/03/2012 for an episode of upper     abdominal pain which occurred suddenly the day prior to admission.     The patient was started on somatostatin LAR 30 mg IM on 07/07/2012     for recurrent disease involving the lateral segment of the left     hepatic lobe, a right jejunal mesenteric node and 2 enhancing     masses adjacent to the uterus. 2. Uterine fibroids noted on imaging studies dating back to late April     2010. 3. Elevated liver transaminases noted in March 2007.  The patient was     evaluated by Dr. Lina Sar in October 2010 and felt that the     etiology was hepatic steatosis. 4. Admission to the hospital for upper abdominal pain with sudden     onset.  Admission dates were 06/30/2012 through 07/03/2012.  CT     scan of  the abdomen and pelvis with IV contrast on 06/30/2012     showed no definite etiology for the pain.  It was noted that a     lymph node in the right jejunal  mesentery had developed central     necrosis when compared with the prior study of 02/24/2012.  There     was no evidence for bowel obstruction.  There has previously been     no evidence for gallstones on recent CT scans, and an abdominal     ultrasound dating back to 05/08/2009. 5. Possible mass involving the left breast on screening mammogram from     06/18/2012.   MEDICATIONS:  Were reviewed and recorded.  Current Outpatient Prescriptions  Medication Sig Dispense Refill  . ascorbic acid (VITAMIN C) 500 MG tablet Take 500 mg by mouth daily.      [name] aspirin 81 MG tablet Take 81 mg by mouth daily.      . Calcium Carbonate-Vitamin D (CALCIUM 600-D PO) Take 1 tablet by mouth daily.      . Cholecalciferol (VITAMIN D3) 1000 UNITS CAPS Take 1 capsule by mouth  daily.      . docusate sodium 100 MG CAPS Take 100 mg by mouth 2 (two) times daily.  10 capsule    . ibuprofen (ADVIL,MOTRIN) 200 MG tablet Take 400 mg by mouth every 6 (six) hours as needed. For abdominal pain.      . Multiple Vitamin (MULTIVITAMIN) tablet Take 1 tablet by mouth daily.      . ondansetron (ZOFRAN ODT) 8 MG disintegrating tablet Take 1 tablet (8 mg total) by mouth every 8 (eight) hours as needed for nausea.  20 tablet  0  . OxyCODONE (OXYCONTIN) 15 mg T12A Take 1 tablet (15 mg total) by mouth every 12 (twelve) hours.  60 tablet  0  . oxyCODONE-acetaminophen (PERCOCET/ROXICET) 5-325 MG per tablet Take 1-2 tablets by mouth every 6 (six) hours as needed for pain.  30 tablet  0  . polyethylene glycol (MIRALAX / GLYCOLAX) packet Take 17 g by mouth daily as needed.  30 each  3   Somatostatin LAR 30 mg IM was initiated on 07/07/2012 and will be administered monthly.  SMOKING HISTORY:  The patient has never smoked cigarettes.  HISTORY:  Vanessa Waller was seen today for  followup of what we now believe to be recurrence of her carcinoid tumor of the ileum stage III, which dates back to her surgical resection in February 2007.  We had been concerned over much of the last 2 years that the patient was recurring even though she was asymptomatic.  We were seeing a very slow increase in her 5HIAA level and suspicious findings on CT scans and octreotide scan suggesting that her disease had recurred.  The patient has not had any symptoms of carcinoid syndrome and had been completely asymptomatic, feeling well up until the early morning hours of 06/29/2012 when she was awakened by severe abdominal pain.  She had gone to the emergency room.  An elevated lipase had suggested the diagnosis of pancreatitis.  Her pain persisted and ultimately she was admitted on 12/17 through 12/20.  The patient was feeling quite poorly with no appetite, bloating, constipation and constant fairly severe upper abdominal pain that was dull by description.  The patient has improved dramatically since her discharge on 12/20.  She has continued to take OxyContin 15 mg twice daily.  She is not using any p.r.n. medicine.  Her pain is evident only when she bends over.  It is located in the right mid abdomen.  This is described as more sharp in quality but it is not persistent and does not seem to be affecting her at this time.  The other symptoms seem to have resolved.  She is having bowel movements every couple of days although her normal habit is once or twice daily. Her appetite is now good.  The patient is here today with her sister, [name].  She was last seen by [place] on 06/30/2012 and prior to that on 03/23/2012.  Today's session was rather lengthy, lasting about an hour.  The patient had many questions.  A lot of these issues we had discussed in the hospital, however, we needed to go over them again.  The patient and her sister were given copies of the report of her CT scan of the abdomen  and pelvis from 06/30/2012, also a copy of the mammogram from 06/18/2012 which suggests a possible mass in the left breast.  PHYSICAL EXAMINATION:  The patient looks well.  Weight is 139.4 pounds. Height 5 feet 1 inch.  Body surface area  1.65 sq m.  Blood pressure 139/83.  Other vital signs are normal.  There is no scleral icterus. Mouth and pharynx are benign.  No peripheral adenopathy palpable.  Heart and lungs are normal.  Breasts are not examined.  Abdomen was generally soft but there was some mild tenderness to palpation in the right mid abdomen just lateral and below the level of the umbilicus.  No abdominal distention, organomegaly or masses.  No obvious ascites.  Extremities: No peripheral edema, clubbing or calf tenderness.  No focal neurologic findings.  The patient does not have a Port-A-Cath or central catheter.  LABORATORY DATA:  Today, white count 4.4, ANC 2.5.  Hemoglobin 14.2, hematocrit 42.1, platelets 323,000.  Chemistries today were normal except for an AST of 65, ALT of 63.  The patient has had intermittent elevations of transaminases in the past, although back on 07/01/2012 these were normal.  LDH today was 276 and albumin 3.8.  On 07/01/2012 LDH was 404.  Lipase on 06/28/2012 was 179 with normal being 11-59.  On 06/30/2012 lipase was 35.  5HIAA in a 24 hour urine collection on 04/13/2012 was 22.7 up from 11.1 on 10/07/2011 and 9.0 on 02/18/2011. Chromogranin A on 03/23/2012 was 18.0 up from 7.8 on 09/23/2011 and 8.6 on 10/01/2010.  IMAGING STUDIES:  1. Two view chest x-ray from 03/02/2010 showed no active disease.  2. CT scan of abdomen and pelvis with and without IV contrast from  03/02/2010 showed no evidence for recurrent or metastatic disease  in the abdomen. There was diffuse hepatic steatosis,  nonobstructing left renal stones and a fibroid uterus.  3. Octreotide (somatostatin-receptor) scan from 09/06/2010 showed a  focus of activity within the right  lower pelvis adjacent to the  bladder. This is the same site where there had been previously  seen a tiny focus of activity. There was concern about recurrence  of the indolent neuroendocrine tumor.  4. MRI of the pelvis with and without IV contrast from 10/01/2010  showed mild interval growth of uterine lesions with imaging  characteristics most consistent with benign leiomyomas. It was  noted that the lesion at the posterior right pelvis had a potential  octreotide uptake which was most suggestive of uptake within a  benign leiomyoma rather than recurrent carcinoid tumor involving  the uterus. It was suggested that if questions remain this lesion  could be biopsied or resected.  5. Bilateral screening mammograms from 12/03/2010 showed no evidence  for malignancy.  6. Octreotide scan from 11/05/2011 showed persistent uptake within the  right abdomen. This was felt to be indeterminate and could represent  bowel activity but cannot exclude a metastatic lesion within the small  bowel or the mesentery. There was activity in the posterior  right lower pelvis corresponding to the abnormal activity seen on  comparison exam from the octreotide scan of 09/06/2010. This activity  was persistent and potentially increased. Activity was localized to the  right aspect of the uterus on comparison MRI and suggested to be uptake  within a leiomyoma versus a potential carcinoid metastasis to the  uterus. CT scan of the abdomen and pelvis was recommended for further  evaluation. The indeterminate activity seen in the right abdomen would  also be addressed on the CT scan.  7. CT scan of abdomen and pelvis with IV contrast carried out on 02/24/2012  showed 3 avidly enhancing lesions in the pelvis. Two of these appear to  be intimately associated with the uterus and were favored to  represent  fibroids, although both were larger than on the prior study of  06/13/2008. A 3rd lesion on image 88 of series 4 was  present along the  right pelvic sidewall of uncertain etiology and significance. There was  also mild colonic diverticulosis and hepatic steatosis. There was a  mildly enlarged lymph node along the superior mesenteric vein. Further  description states that within the anatomic pelvis, there were 2 avidly  enhancing lesions intimately associated with the uterus, the largest of  which is on the right side between the uterus and the adjacent adnexa  (image 96 of series 4) measuring approximately 3.3 x 2.8 x 2.6 cm. The  other lesion along the posterior aspect of the body of the uterus (image  102 of series 4) measures approximately 2.1 x 1.4 x 1.6 cm. There was  also a heterogeneous enhancing soft tissue lesion next to the right  pelvic sidewall adjacent to the right external iliac artery and vein  (image 88 of series 4) measuring 1.4 x 1.7 cm. The mildly enlarged  lymph node along the superior mesenteric vein was seen on image 57 of  series 4.  8. Digital bilateral screening mammogram on 06/18/2012 showed a possible mass in the left breast.  Further evaluation with spot compression views and possibly ultrasound was recommended.  9. CT scan of abdomen and pelvis with IV contrast on 06/30/2012 showed a 1.3 x 1.6 cm suspected metastasis in the lateral segment of the left hepatic lobe.  There was a dominant 4.3 x 4.7 cm pelvic metastasis along the right uterus which had increased in size.  In addition, there was a 1.3 x 2.0 cm mass along the posterior lower uterus also felt to be a metastasis.  There was a nodal metastasis measuring 1.5 cm in the right jejunal mesentery with interval central necrosis and surrounding inflammatory changes.  There were additional numerous small jejunal mesenteric nodes felt to be slightly increased.  There was a 1.4 x 1.8 cm right external iliac node and a 7 x 6 mm node anterior to the left sacrum felt to be unchanged.  There was no evidence for ascites or  bowel obstruction.  The pancreas was unremarkable.   IMPRESSION AND PLAN:  As stated above, today's session was rather lengthy, lasting at least an hour.  The patient and her sister had many questions to ask about the findings, plans for followup and prognosis. It is my feeling that the patient has a recurrence although we have not documented this by biopsy.  As stated, the patient has abnormal findings related to 5HIAA chromogranin A, octreotide scan and CT scan all of which have progressed in recent months.  Fortunately the patient has improved with regard to her abdominal pain.  I have suggested that she try to taper off the OxyContin.  She still has OxyIR as needed for short- term relief.  We will go ahead and start her on Sandostatin LAR 30 mg IM.  This will be repeated every month.  The patient has appointments to see Dr. Chevis Pretty and Dr. Serita Kyle.  We are also going to set her up with an appointment to see Dr. [name] at [facility].  I have already discussed her case with him.  He offered some suggestions regarding treatment with Temodar and Xeloda and other chemotherapy regimens which include oxaliplatin in the event that the patient does not have a satisfactory response to somatostatin.  Those suggestions can be found in my documentation note  dated 07/02/2012.  In any event, we will be setting up an appointment for the patient to see Dr. Lattie Corns.  In addition, the patient understands that she needs to follow through with studies regarding her possible mass in the left breast seen on screening mammograms on 06/18/2012.  The patient will be collecting a 24 hour urine collection.  We have another chromogranin A level from today.  We will plan to see Shirlee in approximately 1 month which will be around January 21 at which time we will check CBC, chemistries and a chromogranin A level.    ______________________________ Samul Dada, M.D. DSM/MEDQ  D:  07/07/2012   T:  07/07/2012  Job:  [id number]

## 2012-07-10 ENCOUNTER — Telehealth (INDEPENDENT_AMBULATORY_CARE_PROVIDER_SITE_OTHER): Payer: Self-pay | Admitting: General Surgery

## 2012-07-10 NOTE — Telephone Encounter (Signed)
Patient called to make preop appt since she had to cancel appt on 07/22/2012. I made appt for 08/04/12 but she is questioning whether she is still going to have surgery. Please advise if appt needed.

## 2012-07-14 ENCOUNTER — Telehealth (INDEPENDENT_AMBULATORY_CARE_PROVIDER_SITE_OTHER): Payer: Self-pay | Admitting: General Surgery

## 2012-07-14 NOTE — Telephone Encounter (Signed)
Called pt and informed her that we can keep her appt with Dr. Carolynne Edouard on 1/21 and tentatively hope to schedule her surgery for 1/24.  She explained that there have been some conflicting answers as to whether or not she needs surgery.  She is seeing a specialist on 07/23/11 at [facility] for another opinion and she will have them fax those notes to [place].

## 2012-07-17 ENCOUNTER — Ambulatory Visit
Admission: RE | Admit: 2012-07-17 | Discharge: 2012-07-17 | Disposition: A | Discharge status: Home / Self Care | Payer: 59 | Source: Ambulatory Visit | Attending: Obstetrics and Gynecology | Admitting: Obstetrics and Gynecology

## 2012-07-17 DIAGNOSIS — R928 Other abnormal and inconclusive findings on diagnostic imaging of breast: Secondary | ICD-10-CM

## 2012-07-17 LAB — 5 HIAA, QUANTITATIVE, URINE, 24 HOUR: 5-HIAA, 24 Hr Urine: 7.6 mg/24 h — ABNORMAL HIGH (ref <=6.0)

## 2012-07-20 ENCOUNTER — Encounter (INDEPENDENT_AMBULATORY_CARE_PROVIDER_SITE_OTHER): Payer: 59 | Admitting: General Surgery

## 2012-07-22 ENCOUNTER — Encounter (INDEPENDENT_AMBULATORY_CARE_PROVIDER_SITE_OTHER): Payer: 59 | Admitting: General Surgery

## 2012-07-22 ENCOUNTER — Encounter: Payer: Self-pay | Admitting: Oncology

## 2012-07-22 NOTE — Progress Notes (Signed)
I spoke with Dr. [name] today by phone. He was seeing the patient at [facility] this morning.  He too was somewhat ambivalent or doubtful that surgery would be beneficial for this patient.  The plan is to continue monthly somatostatin. We will obtain CT scans and urine 5 HIAA levels in about 3-4 months.  If the patient is showing progression she may be a candidate for investigational studies or  possibly Afinitor.  I believe Dr. Lattie Corns will be making another appointment for her to see him in approximately 3-6 months.

## 2012-07-27 ENCOUNTER — Encounter: Payer: Self-pay | Admitting: Oncology

## 2012-07-27 ENCOUNTER — Telehealth: Payer: Self-pay | Admitting: Nurse Practitioner

## 2012-07-27 ENCOUNTER — Telehealth: Payer: Self-pay

## 2012-07-27 NOTE — Telephone Encounter (Signed)
I spoke with patient.  States since her hospital admission she has had frequent and urgent bowel movements multiple times.  Reports first bowel movement is soft and slightly formed, then several watery movements follow.  Reports ongoing for three weeks, every day.  Denies nausea or vomiting.  States she is eating and drinking adequately.  Feels that food is moving through her system at an increased rate.  RN recommended OTC imodium, and stated will forward information to Dr. Arline Asp for further recommendations.

## 2012-07-27 NOTE — Telephone Encounter (Signed)
S/w pt that Dr Arline Asp agrees with the imodium and was asking about her surgical consult which is this week. Pt stated she did not want to self medicate without consulting [place].

## 2012-07-29 ENCOUNTER — Encounter: Payer: Self-pay | Admitting: Gynecology

## 2012-07-29 ENCOUNTER — Ambulatory Visit: Payer: 59 | Attending: Gynecology | Admitting: Gynecology

## 2012-07-29 VITALS — BP 118/70 | HR 80 | Temp 97.9°F | Resp 16 | Ht 61.0 in | Wt 138.0 lb

## 2012-07-29 DIAGNOSIS — Z79899 Other long term (current) drug therapy: Secondary | ICD-10-CM | POA: Insufficient documentation

## 2012-07-29 DIAGNOSIS — R1900 Intra-abdominal and pelvic swelling, mass and lump, unspecified site: Secondary | ICD-10-CM

## 2012-07-29 DIAGNOSIS — K7689 Other specified diseases of liver: Secondary | ICD-10-CM | POA: Insufficient documentation

## 2012-07-29 DIAGNOSIS — Z859 Personal history of malignant neoplasm, unspecified: Secondary | ICD-10-CM | POA: Insufficient documentation

## 2012-07-29 DIAGNOSIS — Z7982 Long term (current) use of aspirin: Secondary | ICD-10-CM | POA: Insufficient documentation

## 2012-07-29 DIAGNOSIS — R1909 Other intra-abdominal and pelvic swelling, mass and lump: Secondary | ICD-10-CM | POA: Insufficient documentation

## 2012-07-29 DIAGNOSIS — Z803 Family history of malignant neoplasm of breast: Secondary | ICD-10-CM | POA: Insufficient documentation

## 2012-07-29 DIAGNOSIS — K6389 Other specified diseases of intestine: Secondary | ICD-10-CM | POA: Insufficient documentation

## 2012-07-29 DIAGNOSIS — N859 Noninflammatory disorder of uterus, unspecified: Secondary | ICD-10-CM | POA: Insufficient documentation

## 2012-07-29 NOTE — Progress Notes (Addendum)
Consult Note: Gyn-Onc Addendum: paragraph 2 sentence 2 has been corrected to read: She has been serially followed by Dr. Johney Frame.  Vanessa Waller 57 y.o. female  Chief Complaint  Patient presents with  . Pelvic Mass    New Consult       HPI: 57 year old white female seen in consultation at the request of Dr. Chevis Pretty regarding possible surgical management of a carcinoid which appears to be metastatic to the uterus or adnexa.  Patient has a past history of a small bowel resection secondary to an obstructing carcinoid in 2007. She has serially been followed by Dr. Johney Frame. A CT scan on 06/30/2012 showed several lesions including lesion in the right side of the uterus which had increased in size and was suspicious for metastatic carcinoid. Lesions were also noted in the liver, and mesentery of the small bowel.  From a gynecologic point of view the patient denies any symptoms. She underwent menopause in her late 72s. She has not taken any hormone replacement therapy. She denies any pelvic pain pressure vaginal bleeding or discharge. She has no other GI or GU symptoms except for occasional diarrhea.  Surgical resection of these lesions was being considered by Dr. Carolynne Edouard. The patient has recently seen Dr. [name] at [facility] who opined that medical management might be more appropriate at this juncture.  Review of Systems:10 point review of systems is negative as noted above.   Vitals: Blood pressure 118/70, pulse 80, temperature 97.9 F (36.6 C), temperature source Oral, resp. rate 16, height 5\' 1"  (1.549 m), weight 138 lb (62.596 kg).  Physical Exam: General : The patient is a healthy woman in no acute distress.  HEENT: normocephalic, extraoccular movements normal; neck is supple without thyromegally  Lynphnodes: Supraclavicular and inguinal nodes not enlarged  Abdomen: Soft, non-tender, no ascites, no organomegally, no masses, no hernias, previous  well-healed midline incision Pelvic:  EGBUS: Normal female  Vagina: Normal, no lesions  Urethra and Bladder: Normal, non-tender  Cervix: Normal, atrophic Uterus: Slightly enlarged, mobile, I do not feel any adnexal masses.  Bi-manual examination: Non-tender; no adenxal masses or nodularity  Rectal: normal sphincter tone, no masses, no blood  Lower extremities: No edema or varicosities. Normal range of motion    Assessment/Plan: Apparent recurrent carcinoid in several locations intraperitoneally and probably on the right side of the uterus or right adnexa. I explained to the patient that I would defer decisions regarding whether surgery would be of help to her primary oncologist and Dr. Carolynne Edouard. If surgery is recommended, I would be happy to assist and perform a total abdominal hysterectomy and bilateral salpingo-oophorectomy.  Patient will be seen by Dr. Carolynne Edouard and Dr. Arline Asp next week where final decision will be made.  Allergies  Allergen Reactions  . Amoxicillin     REACTION: rash  . Cephalexin     REACTION: diarrhea    Past Medical History  Diagnosis Date  . 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 Cancer     Past Surgical History  Procedure Date  . Tubal ligation   . Tumor removal     Current Outpatient Prescriptions  Medication Sig Dispense Refill  . ascorbic acid (VITAMIN C) 500 MG tablet Take 500 mg by mouth daily.      . Calcium Carbonate-Vitamin D (CALCIUM 600-D PO) Take 1 tablet by mouth daily.      . Cholecalciferol (VITAMIN D3) 1000 UNITS CAPS Take 1 capsule by mouth daily.      [name]  fluocinonide cream (LIDEX) 0.05 % Apply topically 2 (two) times daily.      . Multiple Vitamin (MULTIVITAMIN) tablet Take 1 tablet by mouth daily.      [name] octreotide (SANDOSTATIN LAR) 30 MG injection Inject 30 mg into the muscle every 28 (twenty-eight) days.      [name] aspirin 81 MG tablet Take 81 mg by mouth daily.      [name] docusate sodium 100 MG CAPS Take 100 mg by mouth 2 (two) times daily.  10  capsule    . ibuprofen (ADVIL,MOTRIN) 200 MG tablet Take 400 mg by mouth every 6 (six) hours as needed. For abdominal pain.      [name] ondansetron (ZOFRAN ODT) 8 MG disintegrating tablet Take 1 tablet (8 mg total) by mouth every 8 (eight) hours as needed for nausea.  20 tablet  0  . OxyCODONE (OXYCONTIN) 15 mg T12A Take 1 tablet (15 mg total) by mouth every 12 (twelve) hours.  60 tablet  0  . oxyCODONE-acetaminophen (PERCOCET/ROXICET) 5-325 MG per tablet Take 1-2 tablets by mouth every 6 (six) hours as needed for pain.  30 tablet  0  . polyethylene glycol (MIRALAX / GLYCOLAX) packet Take 17 g by mouth daily as needed.  30 each  3    History   Social History  . Marital Status: Single    Spouse Name: N/A    Number of Children: N/A  . Years of Education: N/A   Occupational History  . Not on file.   Social History Main Topics  . Smoking status: Never Smoker   . Smokeless tobacco: Never Used  . Alcohol Use: No  . Drug Use: No  . Sexually Active: No   Other Topics Concern  . Not on file   Social History Narrative  . No narrative on file    Family History  Problem Relation Age of Onset  . Cancer Father     skin  . Cancer Paternal Grandmother     breast  . Diabetes Mother   . Hypertension Mother   . Hyperlipidemia Mother   . Hypothyroidism Sister   . Hyperlipidemia Sister   . Hypothyroidism Sister       Vanessa Corpus, MD 07/29/2012, 4:39 PM

## 2012-07-29 NOTE — Patient Instructions (Signed)
Please keep your appointments with Dr. Arline Asp and Dr. Carolynne Edouard.

## 2012-08-03 NOTE — Progress Notes (Signed)
Need orders in [facility] for surgery on 08/07/12.  Preop will be on 08/06/12 at 0800am.  Thanks.

## 2012-08-04 ENCOUNTER — Encounter (INDEPENDENT_AMBULATORY_CARE_PROVIDER_SITE_OTHER): Payer: 59 | Admitting: General Surgery

## 2012-08-04 ENCOUNTER — Telehealth: Payer: Self-pay | Admitting: Oncology

## 2012-08-04 ENCOUNTER — Ambulatory Visit (HOSPITAL_BASED_OUTPATIENT_CLINIC_OR_DEPARTMENT_OTHER): Payer: 59 | Admitting: Oncology

## 2012-08-04 ENCOUNTER — Telehealth (INDEPENDENT_AMBULATORY_CARE_PROVIDER_SITE_OTHER): Payer: Self-pay

## 2012-08-04 ENCOUNTER — Encounter: Payer: Self-pay | Admitting: Oncology

## 2012-08-04 ENCOUNTER — Ambulatory Visit (HOSPITAL_BASED_OUTPATIENT_CLINIC_OR_DEPARTMENT_OTHER): Payer: 59

## 2012-08-04 ENCOUNTER — Other Ambulatory Visit (HOSPITAL_BASED_OUTPATIENT_CLINIC_OR_DEPARTMENT_OTHER): Payer: 59 | Admitting: Lab

## 2012-08-04 VITALS — BP 145/81 | HR 76 | Temp 98.5°F | Resp 18 | Ht 61.0 in | Wt 136.9 lb

## 2012-08-04 DIAGNOSIS — D3A012 Benign carcinoid tumor of the ileum: Secondary | ICD-10-CM

## 2012-08-04 DIAGNOSIS — R197 Diarrhea, unspecified: Secondary | ICD-10-CM

## 2012-08-04 LAB — COMPREHENSIVE METABOLIC PANEL (CC13)
ALT: 134 U/L — ABNORMAL HIGH (ref 0–55)
AST: 94 U/L — ABNORMAL HIGH (ref 5–34)
CO2: 27 mEq/L (ref 22–29)
Chloride: 105 mEq/L (ref 98–107)
Sodium: 143 mEq/L (ref 136–145)
Total Bilirubin: 0.7 mg/dL (ref 0.20–1.20)
Total Protein: 7.3 g/dL (ref 6.4–8.3)

## 2012-08-04 LAB — CBC WITH DIFFERENTIAL/PLATELET
Eosinophils Absolute: 0.1 10*3/uL (ref 0.0–0.5)
MONO#: 0.2 10*3/uL (ref 0.1–0.9)
NEUT#: 2 10*3/uL (ref 1.5–6.5)
RBC: 4.78 10*6/uL (ref 3.70–5.45)
RDW: 12.8 % (ref 11.2–14.5)
WBC: 4.4 10*3/uL (ref 3.9–10.3)

## 2012-08-04 LAB — LACTATE DEHYDROGENASE (CC13): LDH: 230 U/L (ref 125–245)

## 2012-08-04 MED ORDER — OCTREOTIDE ACETATE 30 MG IM KIT
30.0000 mg | PACK | Freq: Once | INTRAMUSCULAR | Status: AC
  Administered 2012-08-04: 30 mg via INTRAMUSCULAR
  Filled 2012-08-04: qty 1

## 2012-08-04 NOTE — Telephone Encounter (Signed)
appts made and printed for pt aom °

## 2012-08-04 NOTE — Progress Notes (Signed)
CC:   [name]. [name], MD Excell Seltzer. [name], M.D. [name], MD [name]. [name], M.D. [name], MD [name], M.D.  PROBLEM LIST:  1. 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 The patient underwent surgical resection of her  carcinoid tumor on 08/17/2005. Two out of 13 lymph nodes were  positive. The patient's stage was III. The patient was noted to  have an elevated urinary 5HIAA level in July 2011. A positive  octreotide scan was noted coming from the right lower pelvis  adjacent to the bladder on 09/06/2010. Initially this was felt to  be a uterine fibroid. The patient has continued to have positive  octreotide scan on 11/07/2011 and 05/06/2012. CT scans of the  abdomen and pelvis were also carried out on 03/02/2010, 02/24/2012  and 06/30/2012. These scans in conjunction with an elevated 5HIAA  level of 22.7 on 04/13/2012 and a chromogranin A level which had  risen to 18.0 on 03/23/2012 strongly suggested that the patient's  disease had recurred. The patient required admission to the  hospital from 06/30/2012 through 07/03/2012 for an episode of upper  abdominal pain which occurred suddenly the day prior to admission.  The patient was started on somatostatin LAR 30 mg IM on 07/07/2012  for recurrent disease involving the lateral segment of the left  hepatic lobe, a right jejunal mesenteric node and 2 enhancing  masses adjacent to the uterus.  2. Uterine fibroids noted on imaging studies dating back to late April  2010.  3. Elevated liver transaminases noted in March 2007. The patient was  evaluated by Dr. Lina Sar in October 2010 and felt that the  etiology was hepatic steatosis.  4. Admission to the hospital for upper abdominal pain with sudden  onset. Admission dates were 06/30/2012 through 07/03/2012. CT  scan of the abdomen and pelvis with IV contrast on 06/30/2012  showed no definite etiology for the pain. It  was noted that a  lymph node in the right jejunal mesentery had developed central  necrosis when compared with the prior study of 02/24/2012. There  was no evidence for bowel obstruction. There has previously been  no evidence for gallstones on recent CT scans, and an abdominal  ultrasound dating back to 05/08/2009.  5. Possible mass involving the left breast on screening mammogram from  06/18/2012. A diagnostic mammogram and ultrasound of the left breast carried out on 07/17/2012 were normal. Followup screening mammogram was suggested in 1 year.   MEDICATIONS:  Were reviewed and recorded.  Current Outpatient Prescriptions  Medication Sig Dispense Refill  . ascorbic acid (VITAMIN C) 500 MG tablet Take 500 mg by mouth daily.      . Calcium Carbonate-Vitamin D (CALCIUM 600-D PO) Take 1 tablet by mouth daily.      . Cholecalciferol (VITAMIN D3) 1000 UNITS CAPS Take 1 capsule by mouth daily.      . fluocinonide cream (LIDEX) 0.05 % Apply topically 2 (two) times daily.      . Multiple Vitamin (MULTIVITAMIN) tablet Take 1 tablet by mouth daily.      [name] octreotide (SANDOSTATIN LAR) 30 MG injection Inject 30 mg into the muscle every 28 (twenty-eight) days.       Somatostatin LAR 30 mg IM was initiated on 07/07/2012 and will be  administered monthly.    SMOKING HISTORY: The patient has never smoked cigarettes.    HISTORY:  I saw Vanessa Waller today for followup of her  recurrent carcinoid tumor of the ileum, stage III at the time of surgical resection in February 2007.  The patient was last seen by [place] on 07/07/2012.  She was started on somatostatin LAR 30 mg IM on 07/07/2012. She tolerated this well.  The patient states that she has been having loose stools which started during the first week of January.  That has been partially responsive to Imodium about a half a tablet at bedtime. The patient states that she will start off with solid stools and have a series of bowel movements that  ultimately become loose.  She is not having any cramping or flushing.  No nausea, vomiting.  Her abdominal pain has completely resolved.  She is back to work.  The only real change is the diarrhea which has continued but improved with the Imodium.  The patient is otherwise without complaints.  As stated, she was last seen here on 07/07/2012 at which time somatostatin was started.  Since then she has been to [facility] to see Dr. [name] with whom I have spoken.  She saw him on 07/22/2012.  He was somewhat doubtful that surgery would be of significant benefit to the patient given the distribution of her disease which seemed to be pelvic, mesenteric and in the left lobe of liver with what appears to be a solitary liver lesion.  The patient has also seen Dr. Carolynne Edouard and Dr. Serita Kyle.  I do not think anybody is terribly enthusiastic about doing surgery although it certainly could be done for symptomatic or problematic reasons.  Right now the patient is essentially asymptomatic except for the loose stools that she is having.  The patient herself does not seem to be enthusiastic about having surgery at this time.  PHYSICAL EXAMINATION:  The patient looks well.  Weight was 136.8 pounds, height 5 feet 1 inch, body surface area 1.63 sq m.  Blood pressure 145/81.  Other vital signs were normal.  Physical exam was otherwise completely normal.  Breasts were not examined.  LABORATORY DATA:  White count 4.4, ANC 2.0, hemoglobin 14.0, hematocrit 42.4, platelets 237,000.  Chemistries today are pending.  Chemistries from 07/07/2012 were notable for an AST of 65, ALT of 63.  These enzymes have been elevated previously.  IMAGING STUDIES:  1. Two view chest x-ray from 03/02/2010 showed no active disease.  2. CT scan of abdomen and pelvis with and without IV contrast from  03/02/2010 showed no evidence for recurrent or metastatic disease  in the abdomen. There was diffuse hepatic steatosis,    nonobstructing left renal stones and a fibroid uterus.  3. Octreotide (somatostatin-receptor) scan from 09/06/2010 showed a  focus of activity within the right lower pelvis adjacent to the  bladder. This is the same site where there had been previously  seen a tiny focus of activity. There was concern about recurrence  of the indolent neuroendocrine tumor.  4. MRI of the pelvis with and without IV contrast from 10/01/2010  showed mild interval growth of uterine lesions with imaging  characteristics most consistent with benign leiomyomas. It was  noted that the lesion at the posterior right pelvis had a potential  octreotide uptake which was most suggestive of uptake within a  benign leiomyoma rather than recurrent carcinoid tumor involving  the uterus. It was suggested that if questions remain this lesion  could be biopsied or resected.  5. Bilateral screening mammograms from 12/03/2010 showed no evidence  for malignancy.  6. Octreotide scan from 11/05/2011 showed  persistent uptake within the  right abdomen. This was felt to be indeterminate and could represent  bowel activity but cannot exclude a metastatic lesion within the small  bowel or the mesentery. There was activity in the posterior  right lower pelvis corresponding to the abnormal activity seen on  comparison exam from the octreotide scan of 09/06/2010. This activity  was persistent and potentially increased. Activity was localized to the  right aspect of the uterus on comparison MRI and suggested to be uptake  within a leiomyoma versus a potential carcinoid metastasis to the  uterus. CT scan of the abdomen and pelvis was recommended for further  evaluation. The indeterminate activity seen in the right abdomen would  also be addressed on the CT scan.  7. CT scan of abdomen and pelvis with IV contrast carried out on 02/24/2012  showed 3 avidly enhancing lesions in the pelvis. Two of these appear to  be intimately associated  with the uterus and were favored to represent  fibroids, although both were larger than on the prior study of  06/13/2008. A 3rd lesion on image 88 of series 4 was present along the  right pelvic sidewall of uncertain etiology and significance. There was  also mild colonic diverticulosis and hepatic steatosis. There was a  mildly enlarged lymph node along the superior mesenteric vein. Further  description states that within the anatomic pelvis, there were 2 avidly  enhancing lesions intimately associated with the uterus, the largest of  which is on the right side between the uterus and the adjacent adnexa  (image 96 of series 4) measuring approximately 3.3 x 2.8 x 2.6 cm. The  other lesion along the posterior aspect of the body of the uterus (image  102 of series 4) measures approximately 2.1 x 1.4 x 1.6 cm. There was  also a heterogeneous enhancing soft tissue lesion next to the right  pelvic sidewall adjacent to the right external iliac artery and vein  (image 88 of series 4) measuring 1.4 x 1.7 cm. The mildly enlarged  lymph node along the superior mesenteric vein was seen on image 57 of  series 4.  8. Digital bilateral screening mammogram on 06/18/2012  showed a possible mass in the left breast. Further evaluation with spot  compression views and possibly ultrasound was recommended.  9. CT scan of abdomen and pelvis with IV contrast on 06/30/2012 showed a  1.3 x 1.6 cm suspected metastasis in the lateral segment of the left  hepatic lobe. There was a dominant 4.3 x 4.7 cm pelvic metastasis along  the right uterus which had increased in size. In addition, there was a  1.3 x 2.0 cm mass along the posterior lower uterus also felt to be a  metastasis. There was a nodal metastasis measuring 1.5 cm in the right  jejunal mesentery with interval central necrosis and surrounding  inflammatory changes. There were additional numerous small jejunal  mesenteric nodes felt to be slightly increased.  There was a 1.4 x 1.8  cm right external iliac node and a 7 x 6 mm node anterior to the left  sacrum felt to be unchanged. There was no evidence for ascites or bowel  obstruction. The pancreas was unremarkable.  10. Digital diagnostic left mammogram without CAD and left breast ultrasound carried out on 07/17/2012 showed no abnormalities.  There was no evidence for malignancy and screening mammography was recommended in 1 year.  IMPRESSION AND PLAN:  We will go ahead with a second dose of  somatostatin LAR 30 mg IM.  I have suggested that the patient try to take half to 1 Imodium twice a day to see if that will help with her loose stools.  I suspect the diarrhea are secondary to her underlying tumor.  I should mention she is not having any vaginal bleeding or any other pelvic symptomatology.  The plan is to continue with the somatostatin injections on a monthly basis for 3-4 months.  Somewhere around the end of March or perhaps early April we will obtain another CT scan of the abdomen and pelvis, also a 24 hour urine collection for 5HIAA.  The patient had an appointment to see Dr. Lattie Corns, however, she would prefer to wait on this to see how things go.  If the somatostatin seems to be working well for her then we will plan to continue this indefinitely.  If, on the other hand, there is evidence of progressive disease at the time of re-evaluation in the next few months then we will definitely want Dr. Lattie Corns to see the patient again to make recommendations regarding additional treatments.  Some of his thoughts were reflected in my note of 07/07/2012.    ______________________________ Samul Dada, M.D. DSM/MEDQ  D:  08/04/2012  T:  08/04/2012  Job:  [id number]

## 2012-08-04 NOTE — Progress Notes (Signed)
This office note has been dictated.  #[id number]

## 2012-08-05 ENCOUNTER — Other Ambulatory Visit: Payer: Self-pay

## 2012-08-05 DIAGNOSIS — D3A012 Benign carcinoid tumor of the ileum: Secondary | ICD-10-CM

## 2012-08-05 MED ORDER — POTASSIUM CHLORIDE CRYS ER 20 MEQ PO TBCR: 20.0000 meq | EXTENDED_RELEASE_TABLET | Freq: Two times a day (BID) | ORAL | Status: DC

## 2012-08-05 NOTE — Telephone Encounter (Signed)
lvm that Dr Arline Asp wants pt to take potassium 20 meq daily. Also gave list of few fruits and veggies high in potassium. E-scribed Rx.

## 2012-08-06 ENCOUNTER — Other Ambulatory Visit (HOSPITAL_COMMUNITY): Payer: 59

## 2012-08-06 ENCOUNTER — Inpatient Hospital Stay (HOSPITAL_COMMUNITY): Admission: RE | Admit: 2012-08-06 | Payer: 59 | Source: Ambulatory Visit

## 2012-08-06 NOTE — Telephone Encounter (Signed)
Encounter opened in error

## 2012-08-07 ENCOUNTER — Telehealth: Payer: Self-pay | Admitting: Oncology

## 2012-08-07 ENCOUNTER — Ambulatory Visit (HOSPITAL_COMMUNITY): Admission: RE | Admit: 2012-08-07 | Payer: 59 | Source: Ambulatory Visit | Admitting: Gynecology

## 2012-08-07 ENCOUNTER — Telehealth: Payer: Self-pay | Admitting: Medical Oncology

## 2012-08-07 ENCOUNTER — Encounter (HOSPITAL_COMMUNITY): Admission: RE | Payer: Self-pay | Source: Ambulatory Visit

## 2012-08-07 DIAGNOSIS — D3A012 Benign carcinoid tumor of the ileum: Secondary | ICD-10-CM

## 2012-08-07 LAB — CHROMOGRANIN A: Chromogranin A: 3.8 ng/mL (ref 1.9–15.0)

## 2012-08-07 SURGERY — HYSTERECTOMY, ABDOMINAL
Anesthesia: General

## 2012-08-07 NOTE — Telephone Encounter (Signed)
Pt called and left a message that she is not sure how to take her potassium. She understood Olegario Messier to instruct her to take (1) 20 meq tablet daily. When she picked up the prescription it said to take (1) tablet twice a day. Per Dr. Arline Asp have her take (1) twice a day for two days then (1) tablet once a day for a week. We are going to check her potassium in a week. I called pt and left her this message and asked her to call me back with any questions or concerns.

## 2012-08-07 NOTE — Telephone Encounter (Signed)
S/w pt re appt for 1/31 (cell).

## 2012-08-14 ENCOUNTER — Other Ambulatory Visit (HOSPITAL_BASED_OUTPATIENT_CLINIC_OR_DEPARTMENT_OTHER): Payer: 59

## 2012-08-14 DIAGNOSIS — D3A012 Benign carcinoid tumor of the ileum: Secondary | ICD-10-CM

## 2012-08-14 LAB — BASIC METABOLIC PANEL (CC13)
BUN: 12.5 mg/dL (ref 7.0–26.0)
CO2: 28 mEq/L (ref 22–29)
Calcium: 9.1 mg/dL (ref 8.4–10.4)
Creatinine: 1 mg/dL (ref 0.6–1.1)

## 2012-08-17 ENCOUNTER — Telehealth: Payer: Self-pay | Admitting: Medical Oncology

## 2012-08-17 ENCOUNTER — Encounter: Payer: Self-pay | Admitting: Medical Oncology

## 2012-08-17 NOTE — Telephone Encounter (Signed)
Pt called back to let Dr. Arline Asp know that she is still having some diarrhea everyday. She is taking 2-3 imodium daily. She is worried if she stops the potassium she is worried her potassium will drop. Per Dr. Arline Asp she can take up to 8 imodium daily and she can take K-dur 20 meq every other day until we check her labs at next visit.

## 2012-08-17 NOTE — Telephone Encounter (Signed)
Pt called asking for her potassium results from 08/14/12. She is taking potassium and would like to know if she should continue or stop. Per Dr. Arline Asp her potassium was 4.2 and she can stop the potassium. She has an appointment 09/01/12 and we will check labs that day.

## 2012-08-28 ENCOUNTER — Telehealth: Payer: Self-pay | Admitting: Oncology

## 2012-08-28 NOTE — Telephone Encounter (Signed)
S/w pt re new appt for 2/19. Moved from 2/18.

## 2012-09-01 ENCOUNTER — Other Ambulatory Visit: Payer: 59 | Admitting: Lab

## 2012-09-01 ENCOUNTER — Ambulatory Visit: Payer: 59

## 2012-09-01 ENCOUNTER — Ambulatory Visit: Payer: 59 | Admitting: Family

## 2012-09-02 ENCOUNTER — Other Ambulatory Visit (HOSPITAL_BASED_OUTPATIENT_CLINIC_OR_DEPARTMENT_OTHER): Payer: 59 | Admitting: Lab

## 2012-09-02 ENCOUNTER — Telehealth: Payer: Self-pay | Admitting: Oncology

## 2012-09-02 ENCOUNTER — Telehealth: Payer: Self-pay | Admitting: Medical Oncology

## 2012-09-02 ENCOUNTER — Ambulatory Visit (HOSPITAL_BASED_OUTPATIENT_CLINIC_OR_DEPARTMENT_OTHER): Payer: 59

## 2012-09-02 ENCOUNTER — Ambulatory Visit (HOSPITAL_BASED_OUTPATIENT_CLINIC_OR_DEPARTMENT_OTHER): Payer: 59 | Admitting: Physician Assistant

## 2012-09-02 VITALS — BP 141/76 | HR 87 | Temp 97.8°F | Resp 20 | Ht 61.0 in | Wt 134.2 lb

## 2012-09-02 DIAGNOSIS — E876 Hypokalemia: Secondary | ICD-10-CM

## 2012-09-02 DIAGNOSIS — R7401 Elevation of levels of liver transaminase levels: Secondary | ICD-10-CM

## 2012-09-02 DIAGNOSIS — D3A012 Benign carcinoid tumor of the ileum: Secondary | ICD-10-CM

## 2012-09-02 DIAGNOSIS — R197 Diarrhea, unspecified: Secondary | ICD-10-CM

## 2012-09-02 LAB — COMPREHENSIVE METABOLIC PANEL (CC13)
ALT: 145 U/L — ABNORMAL HIGH (ref 0–55)
AST: 153 U/L — ABNORMAL HIGH (ref 5–34)
Albumin: 4 g/dL (ref 3.5–5.0)
Alkaline Phosphatase: 94 U/L (ref 40–150)
Potassium: 3.1 mEq/L — ABNORMAL LOW (ref 3.5–5.1)
Sodium: 144 mEq/L (ref 136–145)
Total Bilirubin: 0.87 mg/dL (ref 0.20–1.20)
Total Protein: 7.9 g/dL (ref 6.4–8.3)

## 2012-09-02 LAB — CBC WITH DIFFERENTIAL/PLATELET
BASO%: 0.6 % (ref 0.0–2.0)
Basophils Absolute: 0 10*3/uL (ref 0.0–0.1)
EOS%: 1.4 % (ref 0.0–7.0)
HGB: 14.5 g/dL (ref 11.6–15.9)
MCH: 28.7 pg (ref 25.1–34.0)
MCHC: 32.2 g/dL (ref 31.5–36.0)
MONO%: 3.5 % (ref 0.0–14.0)
RBC: 5.07 10*6/uL (ref 3.70–5.45)
RDW: 13.5 % (ref 11.2–14.5)
lymph#: 1.5 10*3/uL (ref 0.9–3.3)

## 2012-09-02 LAB — LACTATE DEHYDROGENASE (CC13): LDH: 303 U/L — ABNORMAL HIGH (ref 125–245)

## 2012-09-02 MED ORDER — OCTREOTIDE ACETATE 30 MG IM KIT
30.0000 mg | PACK | Freq: Once | INTRAMUSCULAR | Status: AC
  Administered 2012-09-02: 30 mg via INTRAMUSCULAR
  Filled 2012-09-02: qty 1

## 2012-09-02 NOTE — Patient Instructions (Addendum)
Please come back for labs in approx. 28 days at the time of labs and  Sandostatin injection, and to see Dr. Arline Asp in April 2014 with labs at the time of your 5th injection. Will let you know of the potassium levels when they become available for further instructions.

## 2012-09-02 NOTE — Progress Notes (Signed)
[facility]  [phone]   OFFICE PROGRESS NOTE  HEPLER,MARK, PA CC: Dora M. [name], MD  Excell Seltzer. [name], M.D.  [name], MD  [name]. [name], M.D.  [name], MD  [name], M.D.  PATIENT IDENTIFICATION:  1. 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 The patient underwent surgical resection of her carcinoid tumor on 08/17/2005. Two out of 13 lymph nodes were positive. The patient's stage was III. The patient was noted to have an elevated urinary 5HIAA level in July 2011. A positive octreotide scan was noted coming from the right lower pelvis adjacent to the bladder on 09/06/2010. Initially this was felt to be a uterine fibroid. The patient has continued to have positive octreotide scan on 11/07/2011 and 05/06/2012. CT scans of the abdomen and pelvis were also carried out on 03/02/2010, 02/24/2012 and 06/30/2012. These scans in conjunction with an elevated 5HIAA level of 22.7 on 04/13/2012 and a chromogranin A level which had risen to 18.0 on 03/23/2012 strongly suggested that the patient's disease had recurred. The patient required admission to the hospital from 06/30/2012 through 07/03/2012 for an episode of upper abdominal pain which occurred suddenly the day prior to admission. The patient was started on somatostatin LAR 30 mg IM on 07/07/2012 for recurrent disease involving the lateral segment of the left hepatic lobe, a right jejunal mesenteric node and 2 enhancing masses adjacent to the uterus.  2. Uterine fibroids noted on imaging studies dating back to late April 2010.  3. Elevated liver transaminases noted in March 2007. The patient was evaluated by Dr. Lina Sar in October 2010 and felt that the etiology was hepatic steatosis.  4. Admission to the hospital for upper abdominal pain with sudden onset. Admission dates were 06/30/2012 through 07/03/2012. CT scan of the abdomen and pelvis with IV  contrast on 06/30/2012 showed no definite etiology for the pain. It was noted that a lymph node in the right jejunal mesentery had developed central necrosis when compared with the prior study of 02/24/2012. There was no evidence for bowel obstruction. There has previously been no evidence for gallstones on recent CT scans, and an abdominal ultrasound dating back to 05/08/2009.  5. Possible mass involving the left breast on screening mammogram from 06/18/2012. A diagnostic mammogram and ultrasound of the left breast carried out on 07/17/2012 were normal. Followup screening mammogram was suggested in 1 year.   INTERVAL HISTORY: Vanessa Waller was seen today for followup of her recurrent carcinoid tumor of the ileum, stage III at the time of surgical resection in February 2007. The patient was last seen by [place] on 08/04/12. She was started on somatostatin LAR 30 mg IM on 07/07/2012. She tolerated this well. The patient states that she has been having loose stools which started during the first week of January. That has been partially responsive to Imodium about a half a tablet at bedtime. The patient states that she will start off with solid stools and have a series of bowel movements that ultimately become loose. She is not having any cramping or flushing. No nausea, vomiting. Her abdominal pain has completely resolved. She is back to work. The only real change is the diarrhea controlled bu not resolved with  Imodium. The patient is otherwise without complaints.  Hypokalemia had been noted secondary to diarrhea, for what she is taking K-dur 20 meq every other day. At one point her Potassium was 142, however, patient continues to take  it due to lack of resolution of the loose stools. New chemistries are pending.   MEDICATIONS: Current Outpatient Prescriptions  Medication Sig Dispense Refill  . ascorbic acid (VITAMIN C) 500 MG tablet Take 500 mg by mouth daily.      . Calcium Carbonate-Vitamin D (CALCIUM 600-D  PO) Take 1 tablet by mouth daily.      . Cholecalciferol (VITAMIN D3) 1000 UNITS CAPS Take 1 capsule by mouth daily.      . fluocinonide cream (LIDEX) 0.05 % Apply topically 2 (two) times daily.      . Multiple Vitamin (MULTIVITAMIN) tablet Take 1 tablet by mouth daily.      [name] octreotide (SANDOSTATIN LAR) 30 MG injection Inject 30 mg into the muscle every 28 (twenty-eight) days.      . potassium chloride SA (K-DUR,KLOR-CON) 20 MEQ tablet Take 20 mEq by mouth every other day.       No current facility-administered medications for this visit.   Somatostatin LAR 30 mg IM was initiated on 07/07/2012 and will be  administered monthly  SMOKING HISTORY: The patient has never smoked cigarettes.  ALLERGIES:  is allergic to amoxicillin and cephalexin.  REVIEW OF SYSTEMS:  The rest of the 14-point review of system was negative.   There were no vitals filed for this visit. Wt Readings from Last 3 Encounters:  08/04/12 136 lb 14.4 oz (62.097 kg)  07/29/12 138 lb (62.596 kg)  07/07/12 139 lb 6.4 oz (63.231 kg)      PHYSICAL EXAMINATION:  General:  well-nourished in no acute distress.   HEENT:  Eyes:  no scleral icterus.No oropharyngeal lesions.  Neck was without   thyromegaly. No cervical, supraclavicular or axillary adenopathy. Respiratory: lungs were clear bilaterally without wheezing or crackles.  Cardiovascular:  Regular rate and rhythm, S1/S2, without murmur, rub or gallop.   GI:  abdomen was soft, flat, nontender, nondistended, without organomegaly.    Musculoskeletal:  no spinal tenderness of palpation of vertebral spine.  Skin exam was without ecchymosis, petechiae.   Neuro exam :nonfocal.  Patient was able to get on and off exam table without assistance.  Gait was normal.  Patient was alerted and oriented.  Attention was good.   Language was appropriate.  Mood was normal without depression.  Speech was not pressured.  Thought content was not tangential.       LABORATORY/RADIOLOGY  DATA:   Recent Labs Lab 09/02/12 0835  WBC 4.7  HGB 14.5  HCT 45.1  PLT 248  MCV 88.9  MCH 28.7  MCHC 32.2  RDW 13.5  LYMPHSABS 1.5  MONOABS 0.2  EOSABS 0.1  BASOSABS 0.0    CMP   No results found for this basename: NA, K, CL, CO2, GLUCOSE, BUN, CREATININE, GFRCGP, CALCIUM, MG, AST, ALT, ALKPHOS, BILITOT,  in the last 168 hours      Component Value Date/Time   BILITOT 0.70 08/04/2012 1358   BILITOT 0.7 07/01/2012 0357   BILITOT 0.50 10/01/2010 0000     Radiology Studies:  1. Two view chest x-ray from 03/02/2010 showed no active disease.  2. CT scan of abdomen and pelvis with and without IV contrast from 03/02/2010 showed no evidence for recurrent or metastatic disease in the abdomen. There was diffuse hepatic steatosis, nonobstructing left renal stones and a fibroid uterus.  3. Octreotide (somatostatin-receptor) scan from 09/06/2010 showed a focus of activity within the right lower pelvis adjacent to the bladder. This is the same site where there had been previously seen  a tiny focus of activity. There was concern about recurrence of the indolent neuroendocrine tumor.  4. MRI of the pelvis with and without IV contrast from 10/01/2010 showed mild interval growth of uterine lesions with imaging characteristics most consistent with benign leiomyomas. It was noted that the lesion at the posterior right pelvis had a potential octreotide uptake which was most suggestive of uptake within a benign leiomyoma rather than recurrent carcinoid tumor involving the uterus. It was suggested that if questions remain this lesion could be biopsied or resected.  5. Bilateral screening mammograms from 12/03/2010 showed no evidence for malignancy.  6. Octreotide scan from 11/05/2011 showed persistent uptake within the right abdomen. This was felt to be indeterminate and could represent  bowel activity but cannot exclude a metastatic lesion within the small bowel or the mesentery. There was activity  in the posterior right lower pelvis corresponding to the abnormal activity seen on comparison exam from the octreotide scan of 09/06/2010. This activity was persistent and potentially increased. Activity was localized to the right aspect of the uterus on comparison MRI and suggested to be uptake within a leiomyoma versus a potential carcinoid metastasis to the uterus. CT scan of the abdomen and pelvis was recommended for further evaluation. The indeterminate activity seen in the right abdomen would also be addressed on the CT scan.  7. CT scan of abdomen and pelvis with IV contrast carried out on 02/24/2012 showed 3 avidly enhancing lesions in the pelvis. Two of these appear to be intimately associated with the uterus and were favored to represent fibroids, although both were larger than on the prior study of 06/13/2008. A 3rd lesion on image 88 of series 4 was present along the right pelvic sidewall of uncertain etiology and significance. There was also mild colonic diverticulosis and hepatic steatosis. There was a mildly enlarged lymph node along the superior mesenteric vein. Further description states that within the anatomic pelvis, there were 2 avidly enhancing lesions intimately associated with the uterus, the largest of which is on the right side between the uterus and the adjacent adnexa (image 96 of series 4) measuring approximately 3.3 x 2.8 x 2.6 cm. The other lesion along the posterior aspect of the body of the uterus (image 102 of series 4) measures approximately 2.1 x 1.4 x 1.6 cm. There was also a heterogeneous enhancing soft tissue lesion next to the right pelvic sidewall adjacent to the right external iliac artery and vein (image 88 of series 4) measuring 1.4 x 1.7 cm. The mildly enlarged lymph node along the superior mesenteric vein was seen on image 57 of series 4.  8. Digital bilateral screening mammogram on 06/18/2012 showed a possible mass in the left breast. Further evaluation with spot  compression views and possibly ultrasound was recommended. 9. CT scan of abdomen and pelvis with IV contrast on 06/30/2012 showed a 1.3 x 1.6 cm suspected metastasis in the lateral segment of the left hepatic lobe. There was a dominant 4.3 x 4.7 cm pelvic metastasis along the right uterus which had increased in size. In addition, there was a 1.3 x 2.0 cm mass along the posterior lower uterus also felt to be a metastasis. There was a nodal metastasis measuring 1.5 cm in the right jejunal mesentery with interval central necrosis and surrounding inflammatory changes. There were additional numerous small jejunal mesenteric nodes felt to be slightly increased. There was a 1.4 x 1.8 cm right external iliac node and a 7 x 6 mm node anterior to  the left sacrum felt to be unchanged. There was no evidence for ascites or bowel obstruction. The pancreas was unremarkable.  10. Digital diagnostic left mammogram without CAD and left breast ultrasound carried out on 07/17/2012 showed no abnormalities. There was no  evidence for malignancy and screening mammography was recommended in 1 year.    ASSESSMENT AND PLAN:  We will go ahead with a third  dose of somatostatin LAR 30 mg IM. She will continue to  take half to 1 Imodium twice a day to see if that will help with her loose stools. The diarrhea may be  secondary to her underlying tumor. No vaginal bleeding or any other pelvic symptomatology is noted . The plan is to continue with the somatostatin injections on a monthly basis for total of  3-4 months. She will return for a follow up visit with dr. Arline Asp in 2 months with labs.Somewhere around  April we will obtain another CT scan of the abdomen and pelvis, also a 24 hour urine collection for 5HIAA. If the somatostatin seems to be working well for her then we will plan to continue this indefinitely. If, on the other hand, there is evidence of progressive disease at the time of re-evaluation in the next few months then we  will definitely follow recommendations by Dr. Lattie Corns, which may include increasing Sandostatin LAR dose, versus radioactive  LU/77-DOTATATE, vs Afinitor vs other chemo options. Some of his thoughts were reflected in  Dr.Murinson's note from    07/07/2012.

## 2012-09-02 NOTE — Telephone Encounter (Signed)
I called pt and left her a message that her potassium is low. Per Dr. Arline Asp he would like for her to take 20 meq twice a day for 2 days then 20 meq every day. I asked her to call me if any questions to call me and if she needs a new prescription called in

## 2012-09-06 ENCOUNTER — Other Ambulatory Visit: Payer: Self-pay | Admitting: Oncology

## 2012-09-07 ENCOUNTER — Other Ambulatory Visit: Payer: Self-pay | Admitting: Medical Oncology

## 2012-09-07 ENCOUNTER — Telehealth: Payer: Self-pay | Admitting: Oncology

## 2012-09-07 NOTE — Telephone Encounter (Signed)
lmonvm advising the pt of her lab and injection appt on 09/30/2012 instead of 09/21/2012

## 2012-09-08 ENCOUNTER — Telehealth: Payer: Self-pay | Admitting: Oncology

## 2012-09-08 NOTE — Telephone Encounter (Signed)
pt called and needed same day late appt.Vanessa KitchenMarland KitchenMarland KitchenDone

## 2012-09-21 ENCOUNTER — Ambulatory Visit: Payer: 59 | Admitting: Oncology

## 2012-09-21 ENCOUNTER — Ambulatory Visit: Payer: 59

## 2012-09-21 ENCOUNTER — Other Ambulatory Visit: Payer: 59 | Admitting: Lab

## 2012-09-27 ENCOUNTER — Other Ambulatory Visit: Payer: Self-pay | Admitting: Oncology

## 2012-09-30 ENCOUNTER — Ambulatory Visit: Payer: 59

## 2012-09-30 ENCOUNTER — Ambulatory Visit (HOSPITAL_BASED_OUTPATIENT_CLINIC_OR_DEPARTMENT_OTHER): Payer: 59

## 2012-09-30 ENCOUNTER — Other Ambulatory Visit (HOSPITAL_BASED_OUTPATIENT_CLINIC_OR_DEPARTMENT_OTHER): Payer: 59 | Admitting: Lab

## 2012-09-30 ENCOUNTER — Other Ambulatory Visit: Payer: 59 | Admitting: Lab

## 2012-09-30 VITALS — BP 146/86 | HR 74 | Temp 98.1°F

## 2012-09-30 DIAGNOSIS — D3A012 Benign carcinoid tumor of the ileum: Secondary | ICD-10-CM

## 2012-09-30 DIAGNOSIS — E340 Carcinoid syndrome: Secondary | ICD-10-CM

## 2012-09-30 LAB — COMPREHENSIVE METABOLIC PANEL (CC13)
Alkaline Phosphatase: 100 U/L (ref 40–150)
Creatinine: 1 mg/dL (ref 0.6–1.1)
Glucose: 116 mg/dl — ABNORMAL HIGH (ref 70–99)
Sodium: 142 mEq/L (ref 136–145)
Total Bilirubin: 0.85 mg/dL (ref 0.20–1.20)
Total Protein: 7.4 g/dL (ref 6.4–8.3)

## 2012-09-30 LAB — CBC WITH DIFFERENTIAL/PLATELET
Eosinophils Absolute: 0.1 10*3/uL (ref 0.0–0.5)
HCT: 41.7 % (ref 34.8–46.6)
HGB: 13.9 g/dL (ref 11.6–15.9)
LYMPH%: 46.5 % (ref 14.0–49.7)
MCH: 29.4 pg (ref 25.1–34.0)
MONO%: 7.1 % (ref 0.0–14.0)
NEUT#: 2.3 10*3/uL (ref 1.5–6.5)
WBC: 5.2 10*3/uL (ref 3.9–10.3)

## 2012-09-30 LAB — LACTATE DEHYDROGENASE (CC13): LDH: 260 U/L — ABNORMAL HIGH (ref 125–245)

## 2012-09-30 MED ORDER — OCTREOTIDE ACETATE 30 MG IM KIT
30.0000 mg | PACK | Freq: Once | INTRAMUSCULAR | Status: AC
  Administered 2012-09-30: 30 mg via INTRAMUSCULAR
  Filled 2012-09-30: qty 1

## 2012-09-30 NOTE — Patient Instructions (Addendum)

## 2012-10-01 ENCOUNTER — Other Ambulatory Visit: Payer: Self-pay | Admitting: Oncology

## 2012-10-01 ENCOUNTER — Telehealth: Payer: Self-pay

## 2012-10-01 DIAGNOSIS — D3A012 Benign carcinoid tumor of the ileum: Secondary | ICD-10-CM

## 2012-10-01 NOTE — Telephone Encounter (Signed)
Pt called asking about K+ level drawn yesterday and potassium dosing. She also stated she has not had diarrhea for the last 3 days, she is taking immodium 6x/day. Dr Arline Asp said to keep taking 1 potassium tablet per day. Verified next OV 4/21.

## 2012-10-02 ENCOUNTER — Telehealth: Payer: Self-pay

## 2012-10-02 NOTE — Telephone Encounter (Signed)
lvm that Dr Arline Asp ordered a CT scan about 10/19/12 and a 24 urine collection at same time. Told pt to expect call to schedule CT and she needs to come get a collection container.

## 2012-10-06 ENCOUNTER — Telehealth: Payer: Self-pay | Admitting: Oncology

## 2012-10-06 NOTE — Telephone Encounter (Signed)
Per 3/20 pof schedule pt for lb and 24hr urine collection around 4/7. Pt already on schedule for ct scan 4/7 @ 8am. lmonvm for pt asking that she come by our office on 4/7 after ct to pick up container for 24hr urine.

## 2012-10-09 ENCOUNTER — Telehealth: Payer: Self-pay | Admitting: Oncology

## 2012-10-09 NOTE — Telephone Encounter (Signed)
Gave pt oral contrast today for CT on 10/19/12

## 2012-10-19 ENCOUNTER — Encounter (HOSPITAL_COMMUNITY): Payer: Self-pay

## 2012-10-19 ENCOUNTER — Ambulatory Visit (HOSPITAL_BASED_OUTPATIENT_CLINIC_OR_DEPARTMENT_OTHER): Payer: 59

## 2012-10-19 ENCOUNTER — Ambulatory Visit (HOSPITAL_COMMUNITY)
Admission: RE | Admit: 2012-10-19 | Discharge: 2012-10-19 | Disposition: A | Discharge status: Home / Self Care | Payer: 59 | Source: Ambulatory Visit | Attending: Oncology | Admitting: Oncology

## 2012-10-19 ENCOUNTER — Other Ambulatory Visit: Payer: Self-pay | Admitting: Oncology

## 2012-10-19 DIAGNOSIS — C7A00 Malignant carcinoid tumor of unspecified site: Secondary | ICD-10-CM | POA: Insufficient documentation

## 2012-10-19 DIAGNOSIS — R599 Enlarged lymph nodes, unspecified: Secondary | ICD-10-CM | POA: Insufficient documentation

## 2012-10-19 DIAGNOSIS — D3A012 Benign carcinoid tumor of the ileum: Secondary | ICD-10-CM

## 2012-10-19 DIAGNOSIS — C787 Secondary malignant neoplasm of liver and intrahepatic bile duct: Secondary | ICD-10-CM | POA: Insufficient documentation

## 2012-10-19 DIAGNOSIS — C7B8 Other secondary neuroendocrine tumors: Secondary | ICD-10-CM | POA: Insufficient documentation

## 2012-10-19 DIAGNOSIS — K7689 Other specified diseases of liver: Secondary | ICD-10-CM | POA: Insufficient documentation

## 2012-10-19 DIAGNOSIS — R7401 Elevation of levels of liver transaminase levels: Secondary | ICD-10-CM

## 2012-10-19 DIAGNOSIS — R197 Diarrhea, unspecified: Secondary | ICD-10-CM | POA: Insufficient documentation

## 2012-10-19 DIAGNOSIS — R634 Abnormal weight loss: Secondary | ICD-10-CM | POA: Insufficient documentation

## 2012-10-19 DIAGNOSIS — K769 Liver disease, unspecified: Secondary | ICD-10-CM | POA: Insufficient documentation

## 2012-10-19 LAB — CBC WITH DIFFERENTIAL/PLATELET
BASO%: 0.9 % (ref 0.0–2.0)
EOS%: 0.9 % (ref 0.0–7.0)
HCT: 46.5 % (ref 34.8–46.6)
MCH: 28.6 pg (ref 25.1–34.0)
MCHC: 32.2 g/dL (ref 31.5–36.0)
NEUT%: 66 % (ref 38.4–76.8)
RDW: 13.7 % (ref 11.2–14.5)
lymph#: 1.4 10*3/uL (ref 0.9–3.3)

## 2012-10-19 LAB — COMPREHENSIVE METABOLIC PANEL (CC13)
ALT: 144 U/L — ABNORMAL HIGH (ref 0–55)
AST: 115 U/L — ABNORMAL HIGH (ref 5–34)
Calcium: 9.7 mg/dL (ref 8.4–10.4)
Chloride: 102 mEq/L (ref 98–107)
Creatinine: 0.8 mg/dL (ref 0.6–1.1)

## 2012-10-19 MED ORDER — IOHEXOL 300 MG/ML  SOLN
100.0000 mL | Freq: Once | INTRAMUSCULAR | Status: AC | PRN
  Administered 2012-10-19: 100 mL via INTRAVENOUS

## 2012-10-29 ENCOUNTER — Encounter: Payer: Self-pay | Admitting: Oncology

## 2012-10-29 NOTE — Progress Notes (Signed)
CT scan of the abdomen and pelvis with IV contrast from 10/19/2012 was reviewed.   There are apparently 2 lesions in the liver which are stable compared with the prior scan of 06/30/2012. One lesion is in the lateral segment of the left hepatic lobe measuring 14 x 14 mm. The second lesion is seen in the lateral segment, adjacent to the falciform ligament and measures 9 mm.  This lesion is more conspicuous on the current scan but in retrospect was probably present on the prior study. There is also a capsular hypervascular lesion along the lateral surface of the posterior right hepatic lobe which measures 10 mm. This lesion was also present on the prior scan. This may be a third liver-associated lesion.  The lymphadenopathy in the right abdominal mesentery has significantly decreased in size, now measuring 7 mm in short axis. This lymph node looked necrotic on the earlier scan.  The masses in the pelvis appear to be unchanged. There is a large mass in the right adnexa measuring 4.2 x 4.7 cm, a smaller hypervascular mass in the cul-de-sac posterior to the uterus measuring 1.3 x 1.9 cm and a heterogeneous enhancing soft tissue density in the left adnexa measuring 2.1 x 4.1 cm.

## 2012-11-02 ENCOUNTER — Ambulatory Visit (HOSPITAL_BASED_OUTPATIENT_CLINIC_OR_DEPARTMENT_OTHER): Payer: 59

## 2012-11-02 ENCOUNTER — Encounter: Payer: Self-pay | Admitting: *Deleted

## 2012-11-02 ENCOUNTER — Other Ambulatory Visit: Payer: 59 | Admitting: Lab

## 2012-11-02 ENCOUNTER — Encounter: Payer: Self-pay | Admitting: Oncology

## 2012-11-02 ENCOUNTER — Other Ambulatory Visit: Payer: Self-pay | Admitting: Medical Oncology

## 2012-11-02 ENCOUNTER — Encounter: Payer: Self-pay | Admitting: Internal Medicine

## 2012-11-02 ENCOUNTER — Ambulatory Visit (HOSPITAL_BASED_OUTPATIENT_CLINIC_OR_DEPARTMENT_OTHER): Payer: 59 | Admitting: Oncology

## 2012-11-02 ENCOUNTER — Telehealth: Payer: Self-pay | Admitting: Oncology

## 2012-11-02 VITALS — BP 127/64 | HR 76 | Temp 98.6°F | Resp 20 | Ht 61.0 in | Wt 131.5 lb

## 2012-11-02 DIAGNOSIS — D3A012 Benign carcinoid tumor of the ileum: Secondary | ICD-10-CM

## 2012-11-02 DIAGNOSIS — D3A092 Benign carcinoid tumor of the stomach: Secondary | ICD-10-CM

## 2012-11-02 DIAGNOSIS — R7401 Elevation of levels of liver transaminase levels: Secondary | ICD-10-CM

## 2012-11-02 DIAGNOSIS — R197 Diarrhea, unspecified: Secondary | ICD-10-CM

## 2012-11-02 LAB — CBC WITH DIFFERENTIAL/PLATELET
Basophils Absolute: 0.1 10*3/uL (ref 0.0–0.1)
Eosinophils Absolute: 0.1 10*3/uL (ref 0.0–0.5)
HCT: 45.5 % (ref 34.8–46.6)
LYMPH%: 30.1 % (ref 14.0–49.7)
MCHC: 33.7 g/dL (ref 31.5–36.0)
MONO#: 0.2 10*3/uL (ref 0.1–0.9)
NEUT#: 3.1 10*3/uL (ref 1.5–6.5)
NEUT%: 63.5 % (ref 38.4–76.8)
Platelets: 244 10*3/uL (ref 145–400)
WBC: 4.9 10*3/uL (ref 3.9–10.3)

## 2012-11-02 LAB — COMPREHENSIVE METABOLIC PANEL (CC13)
CO2: 25 mEq/L (ref 22–29)
Creatinine: 0.9 mg/dL (ref 0.6–1.1)
Glucose: 158 mg/dl — ABNORMAL HIGH (ref 70–99)
Total Bilirubin: 0.74 mg/dL (ref 0.20–1.20)

## 2012-11-02 MED ORDER — OCTREOTIDE ACETATE 30 MG IM KIT
30.0000 mg | PACK | Freq: Once | INTRAMUSCULAR | Status: AC
  Administered 2012-11-02: 30 mg via INTRAMUSCULAR
  Filled 2012-11-02: qty 1

## 2012-11-02 NOTE — Progress Notes (Signed)
This office note has been dictated.  #[id number]

## 2012-11-02 NOTE — Telephone Encounter (Signed)
gv and printed appt schedule and avs for pt...Marland Kitchenappt with Dr. [name] on 6.10.14 @ 10:30am

## 2012-11-03 ENCOUNTER — Telehealth: Payer: Self-pay | Admitting: Medical Oncology

## 2012-11-03 ENCOUNTER — Encounter: Payer: Self-pay | Admitting: Oncology

## 2012-11-03 ENCOUNTER — Telehealth: Payer: Self-pay | Admitting: Internal Medicine

## 2012-11-03 NOTE — Telephone Encounter (Signed)
Spoke with Felicita Gage at Dr. Mamie Levers office. Patient scheduled on 11/09/12 at 9:00 AM with Mike Gip, PA.

## 2012-11-03 NOTE — Telephone Encounter (Signed)
Patient is currently scheduled to see Dr. [name] on June 10th.  Dr. Arline Asp is requesting that she be seen sooner.  She is being treated for a carcinoid tumor of the ileum.  She is having chronic diarrhea.  Having to take 6 or 7 Imodium a day.

## 2012-11-03 NOTE — Telephone Encounter (Signed)
Pt had called to let us know that she will not see Dr. [name] until June. I called [facility] spoke with Pattricia Boss who states there are no new pt slots for any of the physicians. Regina-nurse called back asking if pt could see an extender. Dr. Arline Asp states this is fine. Pt will see Amy Esterwood 11/09/12. She needs to arrive at 845 am and appt for 900 am. I spoke with pt and she voiced understanding.

## 2012-11-03 NOTE — Progress Notes (Signed)
CC:   Vanessa Waller. Vanessa Chance, MD Vanessa Waller. Vanessa Waller, M.D. Vanessa Cairo, MD Vanessa Waller. Vanessa Waller, M.D. Vanessa Naas, MD [name], M.D.  PROBLEM LIST:  1. 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 The patient underwent surgical resection of her  carcinoid tumor on 08/17/2005. Two out of 13 lymph nodes were  positive. The patient's stage was III. The patient was noted to  have an elevated urinary 5HIAA level in July 2011. A positive  octreotide scan was noted coming from the right lower pelvis  adjacent to the bladder on 09/06/2010. Initially this was felt to  be a uterine fibroid. The patient has continued to have positive  octreotide scan on 11/07/2011 and 05/06/2012. CT scans of the  abdomen and pelvis were also carried out on 03/02/2010, 02/24/2012  and 06/30/2012. These scans in conjunction with an elevated 5HIAA  level of 22.7 on 04/13/2012 and a chromogranin A level which had  risen to 18.0 on 03/23/2012 strongly suggested that the patient's  disease had recurred. The patient required admission to the  hospital from 06/30/2012 through 07/03/2012 for an episode of upper  abdominal pain which occurred suddenly the day prior to admission.  The patient was started on somatostatin LAR 30 mg IM on 07/07/2012  for recurrent disease involving the lateral segment of the left  hepatic lobe, a right jejunal mesenteric node and 2 enhancing  masses adjacent to the uterus.  Twenty-four-hour urine 5HIAA  on 10/19/2012 was 5.5. CT scan of the abdomen and pelvis with IV contrast on 10/19/2012 showed generally stable disease with the exception of a decrease in the right abdominal mesenteric lymphadenopathy.  There have been 2 lesions in the liver which are felt to be stable.  One lesion is in the lateral segment of the left hepatic lobe measuring 14 x 14 mm.  The second lesion is seen in the lateral segment adjacent to the falciform ligament and measures 9  mm.  2. Uterine fibroids noted on imaging studies dating back to late April  2010.  3. Elevated liver transaminases noted in March 2007. The patient was  evaluated by Dr. Lina Sar in October 2010 and felt that the  etiology was hepatic steatosis.  4. Admission to the hospital for upper abdominal pain with sudden  onset. Admission dates were 06/30/2012 through 07/03/2012. CT  scan of the abdomen and pelvis with IV contrast on 06/30/2012  showed no definite etiology for the pain. It was noted that a  lymph node in the right jejunal mesentery had developed central  necrosis when compared with the prior study of 02/24/2012. There  was no evidence for bowel obstruction. There has previously been  no evidence for gallstones on recent CT scans, and an abdominal  ultrasound dating back to 05/08/2009.  5. Possible mass involving the left breast on screening mammogram from  06/18/2012. A diagnostic mammogram and ultrasound of the left breast carried out on 07/17/2012 were normal. Followup screening mammogram was suggested in 1 year. 6. Diarrhea with onset in December 2013, somewhat improved with Imodium.   TREATMENT PROGRAM: Somatostatin LAR 30 mg IM was initiated on 07/07/2012 and is being  administered monthly.    SMOKING HISTORY: The patient has never smoked cigarettes.   HISTORY:  Vanessa Waller was seen today for followup of her recurrent carcinoid tumor of the ileum, stage III at the time of surgical resection on 08/17/2005.  Vanessa Waller was last seen by [place] on 09/02/2012, and  prior to that on 08/04/2012.  She has been receiving somatostatin LAR 30 mg IM every month.  She tolerates these well, without any problems.  Her main problem is ongoing diarrhea, which started in December 2013.  This morning the patient tells me that she had 4 loose stools.  She is continent. She occasionally will have stools in the middle of the night around 2:00 or 2:30.  She does not think she had any bowel  movements yesterday.  She notes no blood.  There is no abdominal pain or flushing episodes. Appetite and energy are good.  No respiratory problems or fever.  If not for the diarrhea, the patient would feel entirely well.  She recently celebrated her 57th birthday this past Friday.  PHYSICAL EXAMINATION:  General:  The patient looks well.  Vital Signs: Weight is 131 pounds 8 ounces, as compared with 134 pounds on 09/02/2012 and 137 pounds on 08/04/2012.  The patient's baseline weight runs between 135 and 140 pounds.  Height 5 feet 1 inch, body surface area 1.6 meters squared.  Blood pressure 127/64.  Other vital signs are normal. HEENT:  There is no scleral icterus.  Mouth and pharynx are benign. Heart and lungs:  Normal.  Abdomen:  Benign, with no organomegaly or masses palpable.  Extremities:  No peripheral edema or clubbing. Neurologic:  Exam was normal.  Lines:  The patient does not have a central catheter or port.  Breasts:  Not examined.  LABORATORY DATA:  Today white count 4.9, ANC 3.1, hemoglobin 15.3, hematocrit 45.5, platelets 244,000.  Chemistries notable for continued elevation of AST 91 and ALT 106.  On 09/02/2012 AST was 153, ALT 145. Today albumin is 4.0, BUN 11, creatinine 0.9.  LDH is currently pending. As stated above, on 10/19/2012 the 24-hour urine for 5HIAA was 5.5, as compared with 7.6 on 07/13/2012 and 22.7 on 04/13/2012.  IMAGING STUDIES:  1. Two view chest x-ray from 03/02/2010 showed no active disease.  2. CT scan of abdomen and pelvis with and without IV contrast from  03/02/2010 showed no evidence for recurrent or metastatic disease  in the abdomen. There was diffuse hepatic steatosis,  nonobstructing left renal stones and a fibroid uterus.  3. Octreotide (somatostatin-receptor) scan from 09/06/2010 showed a  focus of activity within the right lower pelvis adjacent to the  bladder. This is the same site where there had been previously  seen a tiny focus of  activity. There was concern about recurrence  of the indolent neuroendocrine tumor.  4. MRI of the pelvis with and without IV contrast from 10/01/2010  showed mild interval growth of uterine lesions with imaging  characteristics most consistent with benign leiomyomas. It was  noted that the lesion at the posterior right pelvis had a potential  octreotide uptake which was most suggestive of uptake within a  benign leiomyoma rather than recurrent carcinoid tumor involving  the uterus. It was suggested that if questions remain this lesion  could be biopsied or resected.  5. Bilateral screening mammograms from 12/03/2010 showed no evidence  for malignancy.  6. Octreotide scan from 11/05/2011 showed persistent uptake within the  right abdomen. This was felt to be indeterminate and could represent  bowel activity but cannot exclude a metastatic lesion within the small  bowel or the mesentery. There was activity in the posterior  right lower pelvis corresponding to the abnormal activity seen on  comparison exam from the octreotide scan of 09/06/2010. This activity  was persistent and potentially  increased. Activity was localized to the  right aspect of the uterus on comparison MRI and suggested to be uptake  within a leiomyoma versus a potential carcinoid metastasis to the  uterus. CT scan of the abdomen and pelvis was recommended for further  evaluation. The indeterminate activity seen in the right abdomen would  also be addressed on the CT scan.  7. CT scan of abdomen and pelvis with IV contrast carried out on 02/24/2012  showed 3 avidly enhancing lesions in the pelvis. Two of these appear to  be intimately associated with the uterus and were favored to represent  fibroids, although both were larger than on the prior study of  06/13/2008. A 3rd lesion on image 88 of series 4 was present along the  right pelvic sidewall of uncertain etiology and significance. There was  also mild colonic  diverticulosis and hepatic steatosis. There was a  mildly enlarged lymph node along the superior mesenteric vein. Further  description states that within the anatomic pelvis, there were 2 avidly  enhancing lesions intimately associated with the uterus, the largest of  which is on the right side between the uterus and the adjacent adnexa  (image 96 of series 4) measuring approximately 3.3 x 2.8 x 2.6 cm. The  other lesion along the posterior aspect of the body of the uterus (image  102 of series 4) measures approximately 2.1 x 1.4 x 1.6 cm. There was  also a heterogeneous enhancing soft tissue lesion next to the right  pelvic sidewall adjacent to the right external iliac artery and vein  (image 88 of series 4) measuring 1.4 x 1.7 cm. The mildly enlarged  lymph node along the superior mesenteric vein was seen on image 57 of  series 4.  8. Digital bilateral screening mammogram on 06/18/2012  showed a possible mass in the left breast. Further evaluation with spot  compression views and possibly ultrasound was recommended.  9. CT scan of abdomen and pelvis with IV contrast on 06/30/2012 showed a  1.3 x 1.6 cm suspected metastasis in the lateral segment of the left  hepatic lobe. There was a dominant 4.3 x 4.7 cm pelvic metastasis along  the right uterus which had increased in size. In addition, there was a  1.3 x 2.0 cm mass along the posterior lower uterus also felt to be a  metastasis. There was a nodal metastasis measuring 1.5 cm in the right  jejunal mesentery with interval central necrosis and surrounding  inflammatory changes. There were additional numerous small jejunal  mesenteric nodes felt to be slightly increased. There was a 1.4 x 1.8  cm right external iliac node and a 7 x 6 mm node anterior to the left  sacrum felt to be unchanged. There was no evidence for ascites or bowel  obstruction. The pancreas was unremarkable.  10. Digital diagnostic left mammogram without CAD and left  breast ultrasound  carried out on 07/17/2012 showed no abnormalities. There was no  evidence for malignancy and screening mammography was recommended in 1  year. 11. CT scan of abdomen and pelvis from 10/19/2012 showed generally stable disease with the exception of a decrease in the lymphadenopathy in the right abdominal mesentery.  This now measures 7 mm in short axis.  This lymph node looked necrotic on the earlier scan from 06/30/2012.  We now appreciate 2 lesions in the liver which are felt to be stable.  One lesion is in the lateral segment of the left hepatic lobe, measuring 14  x 14 mm.  The second lesion is seen in the lateral segment adjacent to the falciform ligament and measures 9 mm.  There was a capsular hypervascular lesion along the lateral surface of the posterior right hepatic lobe measuring 10 mm.  This could be a 3rd liver-associated lesion.  The masses in the pelvis appear to be unchanged.  There was a large mass in the right adnexa measuring 4.2 x 4.7 cm, a smaller hypervascular mass in the cul-de-sac posterior to the uterus measuring 1.3 x 1.9 cm, and a heterogeneous enhancing soft tissue density in the left adnexa measuring 2.1 x 4.1 cm.   IMPRESSION AND PLAN:  Clinically, Vanessa Waller is doing well at this time.  We will continue the somatostatin injections IM 30 mg every 4 weeks.  I am setting up injections for 11/30/2012 and 12/28/2012.  We will plan to see Vanessa Waller in 3 months on 01/25/2013, at which time we will check CBC, chemistries, and a chromogranin A level.  I am going to refer Vanessa Waller to Dr. Lina Sar,  whom she has seen in the past.  Hopefully Dr. Juanda Waller will have some ideas about the etiology of the patient's diarrhea and possible therapy.  As stated, Vanessa Waller is taking about 5 or 6 Imodium a day.  She is also on potassium 20 mEq daily.    ______________________________ Samul Dada, M.D. DSM/MEDQ  D:  11/02/2012  T:  11/02/2012  Job:  [id number]

## 2012-11-03 NOTE — Progress Notes (Signed)
The following medicines were inadvertently omitted from my office progress note dated 11/02/2012.     Current Outpatient Prescriptions  Medication Sig Dispense Refill  . ascorbic acid (VITAMIN C) 500 MG tablet Take 500 mg by mouth daily.      . Calcium Carbonate-Vitamin D (CALCIUM 600-D PO) Take 1 tablet by mouth daily.      . Cholecalciferol (VITAMIN D3) 1000 UNITS CAPS Take 1 capsule by mouth daily.      . Multiple Vitamin (MULTIVITAMIN) tablet Take 1 tablet by mouth daily.      [name] octreotide (SANDOSTATIN LAR) 30 MG injection Inject 30 mg into the muscle every 28 (twenty-eight) days.      . potassium chloride SA (K-DUR,KLOR-CON) 20 MEQ tablet Take 20 mEq by mouth every other day.       No current facility-administered medications for this visit.

## 2012-11-06 ENCOUNTER — Telehealth: Payer: Self-pay

## 2012-11-06 NOTE — Telephone Encounter (Signed)
Pt called stating she is still receiving bills with outstanding balance from her visit 02/06/11. She was referred to the billing office and to call us back if she still needs assistance.

## 2012-11-09 ENCOUNTER — Ambulatory Visit (INDEPENDENT_AMBULATORY_CARE_PROVIDER_SITE_OTHER): Payer: 59 | Admitting: Physician Assistant

## 2012-11-09 ENCOUNTER — Encounter: Payer: Self-pay | Admitting: Physician Assistant

## 2012-11-09 VITALS — BP 122/78 | HR 90 | Ht 62.0 in | Wt 131.0 lb

## 2012-11-09 DIAGNOSIS — R634 Abnormal weight loss: Secondary | ICD-10-CM

## 2012-11-09 DIAGNOSIS — C7B8 Other secondary neuroendocrine tumors: Secondary | ICD-10-CM

## 2012-11-09 DIAGNOSIS — C7A00 Malignant carcinoid tumor of unspecified site: Secondary | ICD-10-CM

## 2012-11-09 DIAGNOSIS — R197 Diarrhea, unspecified: Secondary | ICD-10-CM

## 2012-11-09 DIAGNOSIS — C7B09 Secondary carcinoid tumors of other sites: Secondary | ICD-10-CM

## 2012-11-09 MED ORDER — DIPHENOXYLATE-ATROPINE 2.5-0.025 MG PO TABS: 1.0000 | ORAL_TABLET | Freq: Four times a day (QID) | ORAL | Status: DC | PRN

## 2012-11-09 MED ORDER — MOVIPREP 100 G PO SOLR: 1.0000 | Freq: Once | ORAL | Status: DC

## 2012-11-09 NOTE — Progress Notes (Signed)
Reviewed and agree. She had 45 cm of distal small bowl resected on 2///10/2005, 2.5 cm tumor invading the serosa. If the suggested medications don't help, would consider adding bile salt  Binder such as Colestid or Questran. I will see her for colonoscopy.

## 2012-11-09 NOTE — Progress Notes (Signed)
Subjective:    Patient ID: Vanessa Waller, female    DOB: 1955-09-25, 57 y.o.   MRN: [id number]  HPI Vanessa Waller is a pleasant 57 year old white female known previously to Dr. [name] who has not been seen in the past few years. She was diagnosed with a carcinoid tumor of the ileum and underwent a resection for that in 2007. She was noted to have 2 of 13 lymph nodes positive at that time so was stage III. She unfortunately has had a recurrence of her disease documented in the fall of 2013 with elevated 5 HIAA level at 22.7 and a positive octreotide scan. She also a brief admission in December of 2013 current episode of upper abdominal pain and at that time was noted to have metastatic disease to the liver and a right lower quadrant mass with mesenteric lymphadenopathy. He has been followed by Dr. Arline Asp and was started on octreotide injection 30 mg IM once monthly. She is referred here today or evaluation of persistent diarrhea. She says she had done well for several years after her resection but has been having ongoing problems with diarrhea since December of 2013. She has been taking Imodium on a regular basis usually 2 before each meal and says with that she may have 1 or 2 diarrheal stools per day and very occasional episode of nocturnal diarrhea. She has been fairly judicious with the medication so cannot tell me how many stools she would have per day without the Imodium. She's had no fever or chills no ongoing abdominal pain or cramping just urgency. No melena or hematochezia. Her appetite has been good but she has lost about 10 pounds since December 2013. She denies any flushing. He has been requiring potassium supplementation since onset of the diarrhea. Last CT scan was done 10/19/2012 shows no significant change in the small hypervascular liver metastases, decreased right abdominal mesenteric lymphadenopathy and stable. This peritoneal and pelvic metastatic disease as compared to CT from  06/30/2012.    Review of Systems  Constitutional: Positive for unexpected weight change.  HENT: Negative.   Eyes: Negative.   Respiratory: Negative.   Cardiovascular: Negative.   Gastrointestinal: Positive for diarrhea.  Endocrine: Negative.   Genitourinary: Negative.   Musculoskeletal: Negative.   Allergic/Immunologic: Negative.   Neurological: Negative.   Hematological: Negative.   Psychiatric/Behavioral: Negative.    Outpatient Prescriptions Prior to Visit  Medication Sig Dispense Refill  . ascorbic acid (VITAMIN C) 500 MG tablet Take 500 mg by mouth daily.      . Calcium Carbonate-Vitamin D (CALCIUM 600-D PO) Take 1 tablet by mouth daily.      . Cholecalciferol (VITAMIN D3) 1000 UNITS CAPS Take 1 capsule by mouth daily.      . Multiple Vitamin (MULTIVITAMIN) tablet Take 1 tablet by mouth daily.      [name] octreotide (SANDOSTATIN LAR) 30 MG injection Inject 30 mg into the muscle every 28 (twenty-eight) days.      . potassium chloride SA (K-DUR,KLOR-CON) 20 MEQ tablet Take 20 mEq by mouth every other day.       No facility-administered medications prior to visit.   Allergies  Allergen Reactions  . Amoxicillin     REACTION: rash  . Cephalexin     REACTION: diarrhea   Patient Active Problem List   Diagnosis Date Noted  . 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 11/09/2012  . Diarrhea 09/02/2012  . Hypokalemia 09/02/2012  . Hepatic steatosis 07/01/2012  . Left breast mass 07/01/2012  .  GERD (gastroesophageal reflux disease) 07/01/2012  . Abdominal pain 06/30/2012  . 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 TRANSAMINASES, SERUM, ELEVATED 04/20/2009   History  Substance Use Topics  . Smoking status: Never Smoker   . Smokeless tobacco: Never Used  . Alcohol Use: Yes     Comment: rare   family history includes Breast cancer in her paternal grandmother; Diabetes in her mother; Hyperlipidemia in her mother and sister; Hypertension in her mother; Hypothyroidism in  her sisters; and Skin cancer in her father.     Objective:   Physical Exam well-developed white female in no acute distress, pleasant blood pressure 122/78 pulse 90 height 5 foot 2 weight 131. HEENT; nontraumatic normocephalic EOMI PERRLA sclera anicteric Neck; supple no JVD, Cardiovascular; regular rate and rhythm with S1-S2 no murmur or gallop, Pulmonary; clear bilaterally, Abdomen; soft nontender nondistended bowel sounds are active there is no palpable mass or hepatosplenomegaly, Rectal;not done, Extremities; no clubbing cyanosis or edema skin warm and dry, Psych; mood and affect normal and appropriate        Assessment & Plan:  #20 57 year old female with metastatic carcinoid involving the liver, right adnexa, mesentery, with persistent diarrhea over the past 5 months despite octreotide injection on a monthly basis. Suspect that her diarrhea is a manifestation of carcinoid syndrome. #2  hypokalemia secondary to diarrhea  Plan; patient has not had a colonoscopy in greater than 10 years so will schedule colonoscopy for surveillance and also to rule out any other etiology for her diarrhea, will do random biopsies.  Will start trial of Lomotil 1 by mouth one half hour before each meal and at bedtime instead of Imodium  If colonoscopy is unremarkable, she will need increased dosage of octreotide-literature reviewed and this may be increased to 120 mg every 4 weeks, in addition short acting octreotide can be added for refractory symptoms.

## 2012-11-09 NOTE — Patient Instructions (Addendum)
You have been scheduled for a colonoscopy with propofol. Please follow written instructions given to you at your visit today.  Please pick up your prep kit at the pharmacy within the next 1-3 days. If you use inhalers (even only as needed), please bring them with you on the day of your procedure. Your physician has requested that you go to www.startemmi.com and enter the access code given to you at your visit today. This web site gives a general overview about your procedure. However, you should still follow specific instructions given to you by our office regarding your preparation for the procedure.   We sent prescriptions for the colonoscopy prep and the Lomotil for diarrhea to [facility].

## 2012-11-13 ENCOUNTER — Encounter: Payer: Self-pay | Admitting: Internal Medicine

## 2012-11-13 ENCOUNTER — Ambulatory Visit (AMBULATORY_SURGERY_CENTER): Payer: 59 | Admitting: Internal Medicine

## 2012-11-13 VITALS — BP 162/80 | HR 75 | Temp 96.8°F | Resp 30

## 2012-11-13 DIAGNOSIS — D126 Benign neoplasm of colon, unspecified: Secondary | ICD-10-CM

## 2012-11-13 DIAGNOSIS — R634 Abnormal weight loss: Secondary | ICD-10-CM

## 2012-11-13 DIAGNOSIS — C7B8 Other secondary neuroendocrine tumors: Secondary | ICD-10-CM

## 2012-11-13 DIAGNOSIS — D3A012 Benign carcinoid tumor of the ileum: Secondary | ICD-10-CM

## 2012-11-13 DIAGNOSIS — R197 Diarrhea, unspecified: Secondary | ICD-10-CM

## 2012-11-13 MED ORDER — COLESTIPOL HCL 5 G PO GRAN: 5.0000 g | GRANULES | Freq: Every day | ORAL | Status: DC

## 2012-11-13 MED ORDER — SODIUM CHLORIDE 0.9 % IV SOLN: 500.0000 mL | INTRAVENOUS | Status: DC

## 2012-11-13 NOTE — Op Note (Signed)
[facility] [address] [place], [address]   COLONOSCOPY PROCEDURE REPORT  PATIENT: Vanessa Waller, Vanessa Waller  MR#: [id number] BIRTHDATE: January 15, 1956 , 57  yrs. old GENDER: Female ENDOSCOPIST: Hart Carwin, MD REFERRED BY:  Kimberlee Nearing, M.D. Dr Loraine Leriche Tommi Emery PROCEDURE DATE:  11/13/2012 PROCEDURE:   Colonoscopy with biopsy ASA CLASS:   Class III INDICATIONS:Screening and surveillance,personal history of carcinoid of the ileum,s/p remote resection 2007, recurrence liver, elevated 5HIAA, wgt loss, pt on Octreotide 30 IM monthly MEDICATIONS: MAC sedation, administered by CRNA and propofol (Diprivan) 200mg  IV  DESCRIPTION OF PROCEDURE:   After the risks and benefits and of the procedure were explained, informed consent was obtained.  A digital rectal exam revealed no abnormalities of the rectum.    The LB [id number]  endoscope was introduced through the anus and advanced to the cecum, which was identified by both the appendix and ileocecal valve .  The quality of the prep was good, using MoviPrep .  The instrument was then slowly withdrawn as the colon was fully examined.     COLON FINDINGS: There was moderate diverticulosis noted in the sigmoid colon with associated angulation, tortuosity, muscular hypertrophy and luminal narrowing.  ileocecal valve was normal. We were unable to enter terminal ileum. Multiple random biopsies of the colon were obtained to rule out microscopic colitis Retroflexed views revealed no abnormalities.     The scope was then withdrawn from the patient and the procedure completed.  COMPLICATIONS: There were no complications. ENDOSCOPIC IMPRESSION: There was moderate diverticulosis noted in the sigmoid colon status post random colon biopsies to rule out microscopic colitis Nothing to suggest recurrent colon carcinoid   RECOMMENDATIONS: Await pathology results Colestid 1gm, 2 po qd for diarrhea consider increasing the  Octreotide to 60 mg monthly   REPEAT EXAM: In 5 year(s)  for Colonoscopy.  cc:  _______________________________ eSignedHart Carwin, MD 11/13/2012 12:18 PM     PATIENT NAME:  Vanessa Waller, Vanessa Waller MR#: [id number]

## 2012-11-13 NOTE — Progress Notes (Signed)
Report to pacu rn, vss, bbs=clear 

## 2012-11-13 NOTE — Patient Instructions (Addendum)
YOU HAD AN ENDOSCOPIC PROCEDURE TODAY AT THE [facility]: Refer to the procedure report that was given to you for any specific questions about what was found during the examination.  If the procedure report does not answer your questions, please call your gastroenterologist to clarify.  If you requested that your care partner not be given the details of your procedure findings, then the procedure report has been included in a sealed envelope for you to review at your convenience later.  YOU SHOULD EXPECT: Some feelings of bloating in the abdomen. Passage of more gas than usual.  Walking can help get rid of the air that was put into your GI tract during the procedure and reduce the bloating. If you had a lower endoscopy (such as a colonoscopy or flexible sigmoidoscopy) you may notice spotting of blood in your stool or on the toilet paper. If you underwent a bowel prep for your procedure, then you may not have a normal bowel movement for a few days.  DIET: Your first meal following the procedure should be a light meal and then it is ok to progress to your normal diet.  A half-sandwich or bowl of soup is an example of a good first meal.  Heavy or fried foods are harder to digest and may make you feel nauseous or bloated.  Likewise meals heavy in dairy and vegetables can cause extra gas to form and this can also increase the bloating.  Drink plenty of fluids but you should avoid alcoholic beverages for 24 hours.  ACTIVITY: Your care partner should take you home directly after the procedure.  You should plan to take it easy, moving slowly for the rest of the day.  You can resume normal activity the day after the procedure however you should NOT DRIVE or use heavy machinery for 24 hours (because of the sedation medicines used during the test).    SYMPTOMS TO REPORT IMMEDIATELY: A gastroenterologist can be reached at any hour.  During normal business hours, 8:30 AM to 5:00 PM Monday through Friday,  call [phone].  After hours and on weekends, please call the GI answering service at [phone] who will take a message and have the physician on call contact you.   Following lower endoscopy (colonoscopy or flexible sigmoidoscopy):  Excessive amounts of blood in the stool  Significant tenderness or worsening of abdominal pains  Swelling of the abdomen that is new, acute  Fever of 100F or higher    FOLLOW UP: If any biopsies were taken you will be contacted by phone or by letter within the next 1-3 weeks.  Call your gastroenterologist if you have not heard about the biopsies in 3 weeks.  Our staff will call the home number listed on your records the next business day following your procedure to check on you and address any questions or concerns that you may have at that time regarding the information given to you following your procedure. This is a courtesy call and so if there is no answer at the home number and we have not heard from you through the emergency physician on call, we will assume that you have returned to your regular daily activities without incident.  SIGNATURES/CONFIDENTIALITY: You and/or your care partner have signed paperwork which will be entered into your electronic medical record.  These signatures attest to the fact that that the information above on your After Visit Summary has been reviewed and is understood.  Full responsibility of the confidentiality   of this discharge information lies with you and/or your care-partner.     

## 2012-11-16 ENCOUNTER — Telehealth: Payer: Self-pay | Admitting: *Deleted

## 2012-11-16 NOTE — Telephone Encounter (Signed)
  Follow up Call-  Call back number 11/13/2012  Post procedure Call Back phone  # [phone]  Permission to leave phone message No     Patient questions:  Do you have a fever, pain , or abdominal swelling? no Pain Score  0 *  Have you tolerated food without any problems? yes  Have you been able to return to your normal activities? yes  Do you have any questions about your discharge instructions: Diet   no Medications  no Follow up visit  no  Do you have questions or concerns about your Care? no  Actions: * If pain score is 4 or above: No action needed, pain <4.

## 2012-11-17 ENCOUNTER — Encounter: Payer: Self-pay | Admitting: Internal Medicine

## 2012-11-18 ENCOUNTER — Telehealth: Payer: Self-pay

## 2012-11-18 NOTE — Telephone Encounter (Signed)
Pt called stating she had her colonoscopy and Amy Esterwood prescribed lomotil as slightly stronger than immodium which she is now taking. Dr [name] prescribed colestid. At home the pt was looking up the meds and saw they mentioned be careful if taking potassium. She called to let us know the 2 new meds. i reassured her to take the new meds. We are monitoring her potassium and have the next lab draw 5/19. This information forwarded to Dr Arline Asp

## 2012-11-19 ENCOUNTER — Encounter: Payer: Self-pay | Admitting: Oncology

## 2012-11-19 NOTE — Progress Notes (Signed)
This patient underwent a colonoscopy on 11/13/2012 by Dr. Lina Sar. Moderate diverticulosis was noted in the sigmoid colon. Multiple biopsies to rule out microscopic colitis were negative.  Colestid 1 g, 2 daily for diarrhea was recommended. It was suggested that we consider increasing Octreotide to 60 mg monthly.

## 2012-11-25 ENCOUNTER — Other Ambulatory Visit: Payer: Self-pay

## 2012-11-25 DIAGNOSIS — K760 Fatty (change of) liver, not elsewhere classified: Secondary | ICD-10-CM

## 2012-11-25 NOTE — Progress Notes (Signed)
S/w pt that Dr Arline Asp added lipid panel to lab draw on 5/19 per pt request. NPO after MN.

## 2012-11-27 ENCOUNTER — Other Ambulatory Visit: Payer: Self-pay | Admitting: Medical Oncology

## 2012-11-30 ENCOUNTER — Other Ambulatory Visit: Payer: Self-pay | Admitting: Medical Oncology

## 2012-11-30 ENCOUNTER — Ambulatory Visit (HOSPITAL_BASED_OUTPATIENT_CLINIC_OR_DEPARTMENT_OTHER): Payer: 59

## 2012-11-30 ENCOUNTER — Other Ambulatory Visit (HOSPITAL_BASED_OUTPATIENT_CLINIC_OR_DEPARTMENT_OTHER): Payer: 59 | Admitting: Lab

## 2012-11-30 VITALS — BP 125/76 | HR 79 | Temp 98.2°F

## 2012-11-30 DIAGNOSIS — D3A012 Benign carcinoid tumor of the ileum: Secondary | ICD-10-CM

## 2012-11-30 DIAGNOSIS — R197 Diarrhea, unspecified: Secondary | ICD-10-CM

## 2012-11-30 DIAGNOSIS — E876 Hypokalemia: Secondary | ICD-10-CM

## 2012-11-30 DIAGNOSIS — K760 Fatty (change of) liver, not elsewhere classified: Secondary | ICD-10-CM

## 2012-11-30 LAB — BASIC METABOLIC PANEL (CC13)
BUN: 11 mg/dL (ref 7.0–26.0)
Chloride: 103 mEq/L (ref 98–107)
Creatinine: 0.9 mg/dL (ref 0.6–1.1)
Glucose: 108 mg/dl — ABNORMAL HIGH (ref 70–99)

## 2012-11-30 MED ORDER — OCTREOTIDE ACETATE 30 MG IM KIT
30.0000 mg | PACK | Freq: Once | INTRAMUSCULAR | Status: AC
  Administered 2012-11-30: 30 mg via INTRAMUSCULAR
  Filled 2012-11-30: qty 1

## 2012-12-01 LAB — LIPID PANEL
Cholesterol: 171 mg/dL (ref 0–200)
Total CHOL/HDL Ratio: 3.4 Ratio
Triglycerides: 196 mg/dL — ABNORMAL HIGH (ref <150)
VLDL: 39 mg/dL (ref 0–40)

## 2012-12-02 ENCOUNTER — Telehealth: Payer: Self-pay

## 2012-12-02 NOTE — Telephone Encounter (Signed)
Faxed lipid panel to Dr Lina Sar and Lovenia Kim PA with [facility]

## 2012-12-02 NOTE — Telephone Encounter (Signed)
lvm lipid panel returned and triglycerides elevated at 196. Faxed to both Dr Lina Sar and [facility].

## 2012-12-08 ENCOUNTER — Other Ambulatory Visit: Payer: Self-pay | Admitting: Medical Oncology

## 2012-12-08 MED ORDER — POTASSIUM CHLORIDE CRYS ER 20 MEQ PO TBCR: 20.0000 meq | EXTENDED_RELEASE_TABLET | ORAL | Status: DC

## 2012-12-09 ENCOUNTER — Other Ambulatory Visit: Payer: Self-pay | Admitting: Medical Oncology

## 2012-12-09 DIAGNOSIS — E876 Hypokalemia: Secondary | ICD-10-CM

## 2012-12-09 DIAGNOSIS — D3A012 Benign carcinoid tumor of the ileum: Secondary | ICD-10-CM

## 2012-12-22 ENCOUNTER — Ambulatory Visit: Payer: 59 | Admitting: Internal Medicine

## 2012-12-25 ENCOUNTER — Telehealth: Payer: Self-pay | Admitting: Oncology

## 2012-12-25 NOTE — Telephone Encounter (Signed)
Moved 7/14 appt to covering provider due to DM's departure. S/w pt re new time for lb/fu/inj 7/14 @ 8am.

## 2012-12-28 ENCOUNTER — Other Ambulatory Visit (HOSPITAL_BASED_OUTPATIENT_CLINIC_OR_DEPARTMENT_OTHER): Payer: 59 | Admitting: Lab

## 2012-12-28 ENCOUNTER — Ambulatory Visit (HOSPITAL_BASED_OUTPATIENT_CLINIC_OR_DEPARTMENT_OTHER): Payer: 59

## 2012-12-28 VITALS — BP 131/75 | HR 75 | Temp 98.7°F

## 2012-12-28 DIAGNOSIS — D3A012 Benign carcinoid tumor of the ileum: Secondary | ICD-10-CM

## 2012-12-28 DIAGNOSIS — R197 Diarrhea, unspecified: Secondary | ICD-10-CM

## 2012-12-28 DIAGNOSIS — E876 Hypokalemia: Secondary | ICD-10-CM

## 2012-12-28 LAB — BASIC METABOLIC PANEL (CC13)
BUN: 10.4 mg/dL (ref 7.0–26.0)
Calcium: 9.9 mg/dL (ref 8.4–10.4)
Potassium: 3.7 mEq/L (ref 3.5–5.1)
Sodium: 142 mEq/L (ref 136–145)

## 2012-12-28 MED ORDER — OCTREOTIDE ACETATE 30 MG IM KIT
30.0000 mg | PACK | Freq: Once | INTRAMUSCULAR | Status: AC
  Administered 2012-12-28: 30 mg via INTRAMUSCULAR
  Filled 2012-12-28: qty 1

## 2012-12-31 ENCOUNTER — Telehealth: Payer: Self-pay

## 2012-12-31 NOTE — Telephone Encounter (Signed)
Pt called asking about potassium results. Is still taking kcl every other day. Also she reported that her diarrhea is much improved and only taking imodium every other day or so.

## 2013-01-20 ENCOUNTER — Telehealth: Payer: Self-pay | Admitting: Oncology

## 2013-01-20 NOTE — Telephone Encounter (Signed)
s.w. pt and advised on 7.14 time change due to change in MD schedult....pt ok and aware

## 2013-01-21 ENCOUNTER — Telehealth: Payer: Self-pay | Admitting: Medical Oncology

## 2013-01-21 NOTE — Telephone Encounter (Signed)
Pt called to verify her appointments for Monday.

## 2013-01-25 ENCOUNTER — Other Ambulatory Visit: Payer: 59 | Admitting: Lab

## 2013-01-25 ENCOUNTER — Other Ambulatory Visit (HOSPITAL_BASED_OUTPATIENT_CLINIC_OR_DEPARTMENT_OTHER): Payer: 59 | Admitting: Lab

## 2013-01-25 ENCOUNTER — Ambulatory Visit: Payer: 59

## 2013-01-25 ENCOUNTER — Ambulatory Visit (HOSPITAL_BASED_OUTPATIENT_CLINIC_OR_DEPARTMENT_OTHER): Payer: 59

## 2013-01-25 ENCOUNTER — Ambulatory Visit (HOSPITAL_BASED_OUTPATIENT_CLINIC_OR_DEPARTMENT_OTHER): Payer: 59 | Admitting: Hematology and Oncology

## 2013-01-25 VITALS — BP 138/78 | HR 81 | Temp 98.3°F | Wt 130.5 lb

## 2013-01-25 DIAGNOSIS — E340 Carcinoid syndrome: Secondary | ICD-10-CM

## 2013-01-25 DIAGNOSIS — D3A012 Benign carcinoid tumor of the ileum: Secondary | ICD-10-CM

## 2013-01-25 LAB — COMPREHENSIVE METABOLIC PANEL (CC13)
AST: 52 U/L — ABNORMAL HIGH (ref 5–34)
BUN: 8.9 mg/dL (ref 7.0–26.0)
CO2: 28 mEq/L (ref 22–29)
Calcium: 9.7 mg/dL (ref 8.4–10.4)
Chloride: 104 mEq/L (ref 98–109)
Creatinine: 0.9 mg/dL (ref 0.6–1.1)
Glucose: 100 mg/dl (ref 70–140)

## 2013-01-25 LAB — CBC WITH DIFFERENTIAL/PLATELET
BASO%: 0.9 % (ref 0.0–2.0)
Eosinophils Absolute: 0.1 10*3/uL (ref 0.0–0.5)
LYMPH%: 34.7 % (ref 14.0–49.7)
MONO#: 0.4 10*3/uL (ref 0.1–0.9)
NEUT#: 3.9 10*3/uL (ref 1.5–6.5)
Platelets: 266 10*3/uL (ref 145–400)
RBC: 5 10*6/uL (ref 3.70–5.45)
WBC: 6.7 10*3/uL (ref 3.9–10.3)
lymph#: 2.3 10*3/uL (ref 0.9–3.3)

## 2013-01-25 LAB — LACTATE DEHYDROGENASE (CC13): LDH: 214 U/L (ref 125–245)

## 2013-01-25 MED ORDER — OCTREOTIDE ACETATE 30 MG IM KIT
30.0000 mg | PACK | Freq: Once | INTRAMUSCULAR | Status: AC
  Administered 2013-01-25: 30 mg via INTRAMUSCULAR
  Filled 2013-01-25: qty 1

## 2013-01-25 NOTE — Progress Notes (Signed)
**Note Vanessa-Identified via Obfuscation** CC:   Vanessa Waller. Vanessa Chance, MD Vanessa Waller. Vanessa Waller, M.D. Vanessa Cairo, MD Vanessa Waller. Vanessa Waller, M.D. Vanessa Naas, MD Vanessa Waller, M.D.  PROBLEM LIST:  1. 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 The patient underwent surgical resection of her  carcinoid tumor on 08/17/2005. Two out of 13 lymph nodes were  positive. The patient's stage was III. The patient was noted to  have an elevated urinary 5HIAA level in July 2011. A positive  octreotide scan was noted coming from the right lower pelvis  adjacent to the bladder on 09/06/2010. Initially this was felt to  be a uterine fibroid. The patient has continued to have positive  octreotide scan on 11/07/2011 and 05/06/2012. CT scans of the  abdomen and pelvis were also carried out on 03/02/2010, 02/24/2012  and 06/30/2012. These scans in conjunction with an elevated 5HIAA  level of 22.7 on 04/13/2012 and a chromogranin A level which had  risen to 18.0 on 03/23/2012 strongly suggested that the patient's  disease had recurred. The patient required admission to the  hospital from 06/30/2012 through 07/03/2012 for an episode of upper  abdominal pain which occurred suddenly the day prior to admission.  The patient was started on somatostatin LAR 30 mg IM on 07/07/2012  for recurrent disease involving the lateral segment of the left  hepatic lobe, a right jejunal mesenteric node and 2 enhancing  masses adjacent to the uterus.  Twenty-four-hour urine 5HIAA  on 10/19/2012 was 5.5. CT scan of the abdomen and pelvis with IV contrast on 10/19/2012 showed generally stable disease with the exception of a decrease in the right abdominal mesenteric lymphadenopathy.  There have been 2 lesions in the liver which are felt to be stable.  One lesion is in the lateral segment of the left hepatic lobe measuring 14 x 14 mm.  The second lesion is seen in the lateral segment adjacent to the falciform ligament and measures 9  mm.  2. Uterine fibroids noted on imaging studies dating back to late April  2010.  3. Elevated liver transaminases noted in March 2007. The patient was  evaluated by Dr. Lina Waller in October 2010 and felt that the  etiology was hepatic steatosis.  4. Admission to the hospital for upper abdominal pain with sudden  onset. Admission dates were 06/30/2012 through 07/03/2012. CT  scan of the abdomen and pelvis with IV contrast on 06/30/2012  showed no definite etiology for the pain. It was noted that a  lymph node in the right jejunal mesentery had developed central  necrosis when compared with the prior study of 02/24/2012. There  was no evidence for bowel obstruction. There has previously been  no evidence for gallstones on recent CT scans, and an abdominal  ultrasound dating back to 05/08/2009.  5. Possible mass involving the left breast on screening mammogram from  06/18/2012. A diagnostic mammogram and ultrasound of the left breast carried out on 07/17/2012 were normal. Followup screening mammogram was suggested in 1 year. 6. Diarrhea with onset in December 2013, somewhat improved with Imodium.   TREATMENT PROGRAM: Somatostatin LAR 30 mg IM was initiated on 07/07/2012 and is being  administered monthly.    SMOKING HISTORY: The patient has never smoked cigarettes.   HISTORY:  Vanessa Waller was seen today for followup of her recurrent carcinoid tumor of the ileum, stage III at the time of surgical resection on 08/17/2005.  Vanessa Waller has been receiving somatostatin LAR 30 mg IM  every month.  She tolerates these well, without any problems.  Her main problem is ongoing diarrhea, which started in December 2013, which is much better now.  There is no abdominal pain or flushing episodes. Appetite and energy are good.  No respiratory problems or fever.      PHYSICAL EXAMINATION:  General:  The patient looks well.  Vital Signs: Weight is 131 pounds 8 ounces, as compared with 134  pounds on 09/02/2012 and 137 pounds on 08/04/2012.  The patient's baseline weight runs between 135 and 140 pounds.  Height 5 feet 1 inch, body surface area 1.6 meters squared.  Blood pressure 138/78.  Other vital signs are normal. HEENT:  There is no scleral icterus.  Mouth and pharynx are benign. Heart and lungs:  Normal.  Abdomen:  Benign, with no organomegaly or masses palpable.  Extremities:  No peripheral edema or clubbing. Neurologic:  Exam was normal.  Lines:  The patient does not have a central catheter or port.  Breasts:  Not examined.  LABORATORY DATA:   CBC    Component Value Date/Time   WBC 6.7 01/25/2013 1112   WBC 12.9* 07/01/2012 0357   RBC 5.00 01/25/2013 1112   RBC 4.73 07/01/2012 0357   HGB 15.4 01/25/2013 1112   HGB 13.8 07/01/2012 0357   HCT 45.4 01/25/2013 1112   HCT 43.1 07/01/2012 0357   PLT 266 01/25/2013 1112   PLT 309 07/01/2012 0357   MCV 90.9 01/25/2013 1112   MCV 91.1 07/01/2012 0357   MCH 30.8 01/25/2013 1112   MCH 29.2 07/01/2012 0357   MCHC 33.8 01/25/2013 1112   MCHC 32.0 07/01/2012 0357   RDW 12.7 01/25/2013 1112   RDW 12.7 07/01/2012 0357   LYMPHSABS 2.3 01/25/2013 1112   LYMPHSABS 1.3 06/30/2012 1508   MONOABS 0.4 01/25/2013 1112   MONOABS 0.6 06/30/2012 1508   EOSABS 0.1 01/25/2013 1112   EOSABS 0.0 06/30/2012 1508   BASOSABS 0.1 01/25/2013 1112   BASOSABS 0.0 06/30/2012 1508    . IMAGING STUDIES:  1. Two view chest x-ray from 03/02/2010 showed no active disease.  2. CT scan of abdomen and pelvis with and without IV contrast from  03/02/2010 showed no evidence for recurrent or metastatic disease  in the abdomen. There was diffuse hepatic steatosis,  nonobstructing left renal stones and a fibroid uterus.  3. Octreotide (somatostatin-receptor) scan from 09/06/2010 showed a  focus of activity within the right lower pelvis adjacent to the  bladder. This is the same site where there had been previously  seen a tiny focus of activity. There was  concern about recurrence  of the indolent neuroendocrine tumor.  4. MRI of the pelvis with and without IV contrast from 10/01/2010  showed mild interval growth of uterine lesions with imaging  characteristics most consistent with benign leiomyomas. It was  noted that the lesion at the posterior right pelvis had a potential  octreotide uptake which was most suggestive of uptake within a  benign leiomyoma rather than recurrent carcinoid tumor involving  the uterus. It was suggested that if questions remain this lesion  could be biopsied or resected.  5. Bilateral screening mammograms from 12/03/2010 showed no evidence  for malignancy.  6. Octreotide scan from 11/05/2011 showed persistent uptake within the  right abdomen. This was felt to be indeterminate and could represent  bowel activity but cannot exclude a metastatic lesion within the small  bowel or the mesentery. There was activity in the posterior  right lower  pelvis corresponding to the abnormal activity seen on  comparison exam from the octreotide scan of 09/06/2010. This activity  was persistent and potentially increased. Activity was localized to the  right aspect of the uterus on comparison MRI and suggested to be uptake  within a leiomyoma versus a potential carcinoid metastasis to the  uterus. CT scan of the abdomen and pelvis was recommended for further  evaluation. The indeterminate activity seen in the right abdomen would  also be addressed on the CT scan.  7. CT scan of abdomen and pelvis with IV contrast carried out on 02/24/2012  showed 3 avidly enhancing lesions in the pelvis. Two of these appear to  be intimately associated with the uterus and were favored to represent  fibroids, although both were larger than on the prior study of  06/13/2008. A 3rd lesion on image 88 of series 4 was present along the  right pelvic sidewall of uncertain etiology and significance. There was  also mild colonic diverticulosis and  hepatic steatosis. There was a  mildly enlarged lymph node along the superior mesenteric vein. Further  description states that within the anatomic pelvis, there were 2 avidly  enhancing lesions intimately associated with the uterus, the largest of  which is on the right side between the uterus and the adjacent adnexa  (image 96 of series 4) measuring approximately 3.3 x 2.8 x 2.6 cm. The  other lesion along the posterior aspect of the body of the uterus (image  102 of series 4) measures approximately 2.1 x 1.4 x 1.6 cm. There was  also a heterogeneous enhancing soft tissue lesion next to the right  pelvic sidewall adjacent to the right external iliac artery and vein  (image 88 of series 4) measuring 1.4 x 1.7 cm. The mildly enlarged  lymph node along the superior mesenteric vein was seen on image 57 of  series 4.  8. Digital bilateral screening mammogram on 06/18/2012  showed a possible mass in the left breast. Further evaluation with spot  compression views and possibly ultrasound was recommended.  9. CT scan of abdomen and pelvis with IV contrast on 06/30/2012 showed a  1.3 x 1.6 cm suspected metastasis in the lateral segment of the left  hepatic lobe. There was a dominant 4.3 x 4.7 cm pelvic metastasis along  the right uterus which had increased in size. In addition, there was a  1.3 x 2.0 cm mass along the posterior lower uterus also felt to be a  metastasis. There was a nodal metastasis measuring 1.5 cm in the right  jejunal mesentery with interval central necrosis and surrounding  inflammatory changes. There were additional numerous small jejunal  mesenteric nodes felt to be slightly increased. There was a 1.4 x 1.8  cm right external iliac node and a 7 x 6 mm node anterior to the left  sacrum felt to be unchanged. There was no evidence for ascites or bowel  obstruction. The pancreas was unremarkable.  10. Digital diagnostic left mammogram without CAD and left breast ultrasound   carried out on 07/17/2012 showed no abnormalities. There was no  evidence for malignancy and screening mammography was recommended in 1  year. 11. CT scan of abdomen and pelvis from 10/19/2012 showed generally stable disease with the exception of a decrease in the lymphadenopathy in the right abdominal mesentery.  This now measures 7 mm in short axis.  This lymph node looked necrotic on the earlier scan from 06/30/2012.  We now appreciate 2 lesions  in the liver which are felt to be stable.  One lesion is in the lateral segment of the left hepatic lobe, measuring 14 x 14 mm.  The second lesion is seen in the lateral segment adjacent to the falciform ligament and measures 9 mm.  There was a capsular hypervascular lesion along the lateral surface of the posterior right hepatic lobe measuring 10 mm.  This could be a 3rd liver-associated lesion.  The masses in the pelvis appear to be unchanged.  There was a large mass in the right adnexa measuring 4.2 x 4.7 cm, a smaller hypervascular mass in the cul-Vanessa-sac posterior to the uterus measuring 1.3 x 1.9 cm, and a heterogeneous enhancing soft tissue density in the left adnexa measuring 2.1 x 4.1 cm.   IMPRESSION AND PLAN:  Clinically, Vanessa Waller is doing well at this time.  We will continue the somatostatin injections IM 30 mg every 4 weeks. We will plan to see Vanessa Waller in 3 months on October, 2014, at which time we will check CBC, chemistries, and a chromogranin A level and obtain a restaging CT scans.  Vanessa Dakins, MD 01/25/2013 12:10 PM

## 2013-01-26 ENCOUNTER — Telehealth: Payer: Self-pay | Admitting: Hematology and Oncology

## 2013-01-26 NOTE — Telephone Encounter (Signed)
s.w. pt and advised on all appts...pt on mychart and aware of appts...pt aware cs will call to sched ct.

## 2013-01-28 LAB — CHROMOGRANIN A: Chromogranin A: 6 ng/mL (ref 1.9–15.0)

## 2013-02-19 ENCOUNTER — Encounter: Payer: Self-pay | Admitting: Medical Oncology

## 2013-02-22 ENCOUNTER — Other Ambulatory Visit: Payer: 59

## 2013-02-22 ENCOUNTER — Ambulatory Visit (HOSPITAL_BASED_OUTPATIENT_CLINIC_OR_DEPARTMENT_OTHER): Payer: 59

## 2013-02-22 VITALS — BP 115/72 | HR 98 | Temp 98.6°F

## 2013-02-22 DIAGNOSIS — D3A012 Benign carcinoid tumor of the ileum: Secondary | ICD-10-CM

## 2013-02-22 DIAGNOSIS — R197 Diarrhea, unspecified: Secondary | ICD-10-CM

## 2013-02-22 MED ORDER — OCTREOTIDE ACETATE 30 MG IM KIT
30.0000 mg | PACK | Freq: Once | INTRAMUSCULAR | Status: AC
  Administered 2013-02-22: 30 mg via INTRAMUSCULAR
  Filled 2013-02-22: qty 1

## 2013-03-19 ENCOUNTER — Other Ambulatory Visit: Payer: Self-pay | Admitting: Medical Oncology

## 2013-03-19 DIAGNOSIS — E876 Hypokalemia: Secondary | ICD-10-CM

## 2013-03-22 ENCOUNTER — Ambulatory Visit (HOSPITAL_BASED_OUTPATIENT_CLINIC_OR_DEPARTMENT_OTHER): Payer: 59

## 2013-03-22 ENCOUNTER — Other Ambulatory Visit (HOSPITAL_BASED_OUTPATIENT_CLINIC_OR_DEPARTMENT_OTHER): Payer: 59

## 2013-03-22 ENCOUNTER — Telehealth: Payer: Self-pay | Admitting: Internal Medicine

## 2013-03-22 VITALS — BP 136/88 | HR 89 | Temp 98.4°F

## 2013-03-22 DIAGNOSIS — E876 Hypokalemia: Secondary | ICD-10-CM

## 2013-03-22 DIAGNOSIS — D3A012 Benign carcinoid tumor of the ileum: Secondary | ICD-10-CM

## 2013-03-22 DIAGNOSIS — R197 Diarrhea, unspecified: Secondary | ICD-10-CM

## 2013-03-22 LAB — BASIC METABOLIC PANEL (CC13)
Chloride: 106 mEq/L (ref 98–109)
Potassium: 3.3 mEq/L — ABNORMAL LOW (ref 3.5–5.1)
Sodium: 144 mEq/L (ref 136–145)

## 2013-03-22 MED ORDER — OCTREOTIDE ACETATE 30 MG IM KIT
30.0000 mg | PACK | Freq: Once | INTRAMUSCULAR | Status: AC
  Administered 2013-03-22: 30 mg via INTRAMUSCULAR
  Filled 2013-03-22: qty 1

## 2013-03-22 NOTE — Telephone Encounter (Signed)
Gave pt oral contrast today

## 2013-03-23 ENCOUNTER — Telehealth: Payer: Self-pay | Admitting: Medical Oncology

## 2013-03-23 NOTE — Telephone Encounter (Signed)
Pt called to inquire about her potassium level from 03/22/13. I informed her that it was 3.3. I asked if she is still taking her K-Dur 20 meq every other day. She states since her potassium was 4.1 in July, she has not been taking it. She has had some diarrhea. She will restart taking the potassium as prescribed.

## 2013-03-29 ENCOUNTER — Ambulatory Visit (HOSPITAL_COMMUNITY): Payer: 59

## 2013-03-29 ENCOUNTER — Ambulatory Visit (HOSPITAL_COMMUNITY)
Admission: RE | Admit: 2013-03-29 | Discharge: 2013-03-29 | Disposition: A | Discharge status: Home / Self Care | Payer: 59 | Source: Ambulatory Visit | Attending: Hematology and Oncology | Admitting: Hematology and Oncology

## 2013-03-29 DIAGNOSIS — M47817 Spondylosis without myelopathy or radiculopathy, lumbosacral region: Secondary | ICD-10-CM | POA: Insufficient documentation

## 2013-03-29 DIAGNOSIS — D3A012 Benign carcinoid tumor of the ileum: Secondary | ICD-10-CM

## 2013-03-29 DIAGNOSIS — K7689 Other specified diseases of liver: Secondary | ICD-10-CM | POA: Insufficient documentation

## 2013-03-29 DIAGNOSIS — D259 Leiomyoma of uterus, unspecified: Secondary | ICD-10-CM | POA: Insufficient documentation

## 2013-03-29 MED ORDER — IOHEXOL 300 MG/ML  SOLN
100.0000 mL | Freq: Once | INTRAMUSCULAR | Status: AC | PRN
  Administered 2013-03-29: 100 mL via INTRAVENOUS

## 2013-04-16 ENCOUNTER — Other Ambulatory Visit: Payer: Self-pay | Admitting: Medical Oncology

## 2013-04-16 DIAGNOSIS — D3A012 Benign carcinoid tumor of the ileum: Secondary | ICD-10-CM

## 2013-04-19 ENCOUNTER — Ambulatory Visit (HOSPITAL_BASED_OUTPATIENT_CLINIC_OR_DEPARTMENT_OTHER): Payer: 59

## 2013-04-19 ENCOUNTER — Telehealth: Payer: Self-pay | Admitting: Internal Medicine

## 2013-04-19 ENCOUNTER — Ambulatory Visit (HOSPITAL_BASED_OUTPATIENT_CLINIC_OR_DEPARTMENT_OTHER): Payer: 59 | Admitting: Lab

## 2013-04-19 ENCOUNTER — Encounter: Payer: Self-pay | Admitting: Internal Medicine

## 2013-04-19 ENCOUNTER — Ambulatory Visit (HOSPITAL_BASED_OUTPATIENT_CLINIC_OR_DEPARTMENT_OTHER): Payer: 59 | Admitting: Internal Medicine

## 2013-04-19 VITALS — BP 143/84 | HR 84 | Temp 97.8°F | Resp 20 | Ht 62.0 in | Wt 134.8 lb

## 2013-04-19 DIAGNOSIS — R7401 Elevation of levels of liver transaminase levels: Secondary | ICD-10-CM

## 2013-04-19 DIAGNOSIS — E876 Hypokalemia: Secondary | ICD-10-CM

## 2013-04-19 DIAGNOSIS — D3A012 Benign carcinoid tumor of the ileum: Secondary | ICD-10-CM

## 2013-04-19 DIAGNOSIS — C7B09 Secondary carcinoid tumors of other sites: Secondary | ICD-10-CM

## 2013-04-19 DIAGNOSIS — E340 Carcinoid syndrome: Secondary | ICD-10-CM

## 2013-04-19 DIAGNOSIS — N6320 Unspecified lump in the left breast, unspecified quadrant: Secondary | ICD-10-CM

## 2013-04-19 DIAGNOSIS — N63 Unspecified lump in unspecified breast: Secondary | ICD-10-CM

## 2013-04-19 DIAGNOSIS — C7B8 Other secondary neuroendocrine tumors: Secondary | ICD-10-CM

## 2013-04-19 LAB — CBC WITH DIFFERENTIAL/PLATELET
Basophils Absolute: 0.1 10*3/uL (ref 0.0–0.1)
EOS%: 1.1 % (ref 0.0–7.0)
HCT: 44.2 % (ref 34.8–46.6)
HGB: 14.8 g/dL (ref 11.6–15.9)
MCH: 30.3 pg (ref 25.1–34.0)
MCV: 90.6 fL (ref 79.5–101.0)
MONO%: 4.6 % (ref 0.0–14.0)
NEUT%: 57 % (ref 38.4–76.8)
Platelets: 233 10*3/uL (ref 145–400)

## 2013-04-19 LAB — COMPREHENSIVE METABOLIC PANEL (CC13)
AST: 58 U/L — ABNORMAL HIGH (ref 5–34)
Alkaline Phosphatase: 71 U/L (ref 40–150)
BUN: 10.8 mg/dL (ref 7.0–26.0)
Calcium: 9.4 mg/dL (ref 8.4–10.4)
Creatinine: 0.9 mg/dL (ref 0.6–1.1)

## 2013-04-19 MED ORDER — OCTREOTIDE ACETATE 30 MG IM KIT
30.0000 mg | PACK | Freq: Once | INTRAMUSCULAR | Status: AC
  Administered 2013-04-19: 30 mg via INTRAMUSCULAR
  Filled 2013-04-19: qty 1

## 2013-04-19 MED ORDER — POTASSIUM CHLORIDE CRYS ER 20 MEQ PO TBCR: 20.0000 meq | EXTENDED_RELEASE_TABLET | ORAL | Status: DC

## 2013-04-19 NOTE — Patient Instructions (Addendum)
Carcinoid Syndrome Carcinoid syndrome is a group of symptoms (the main one is flushing) caused by tumors called carcinoids. Carcinoids secrete hormones (serotonin) and chemicals that cause the symptoms. This happens after they have spread (metastasized). The tumors can occur anywhere. But about 70% of them start out in the appendix or small bowel. Carcinoid syndrome usually affects adults ages 13 to 57, and affects both sexes equally. Carcinoids grow slowly and are often not cancerous (benign). But they can spread and are then cancerous (malignant). In a few cases, carcinoid cells spread to other body parts. There they produce secondary, hormone-producing (serotonin) tumors.  CAUSES  There are no preventive measures which can be taken to prevent this illness. But smoking, increasing age, and a family history for this type of tumor seem to be connected with increased occurrence. Symptoms are caused by secretions of the tumor. Some of the triggers which cause the tumor to release the hormone and bring on symptoms are:  Heavy exercise.  Tomatoes.  Pineapple.  Alcohol.  Plums.  Walnuts.  Bananas.  Avocados.  Cheese. SYMPTOMS  Many people have no symptoms. The main symptom is flushing (like a hot flash) of the head and neck. Others may have symptoms that include:  Flushed skin on the head and neck.  Diarrhea with abdominal cramps.  Irregular heartbeat.  Low blood pressure.  Unexplained weight loss.  Watery eyes.  Respiratory symptoms similar to asthma.  Nausea and vomiting.  Sexual dysfunction in men.  Disease of the heart valves. DIAGNOSIS  Your caregiver will do a physical exam and ask questions about your symptoms. A number of medical tests will be done.   Nonfunctioning carcinoids. This is a carcinoid which is not producing hormones or symptoms. These can be detected similarly to other growths by angiography, CT, or MRI (all specialized x-rays), depending on the site.  Small-bowel carcinoids may show abnormalities on barium x-ray studies. A final diagnosis is made by taking a part of the growth out and looking at it under a microscope.  Functioning carcinoids. These produce hormones and problems. They are suspected on the basis of the symptoms. The diagnosis is confirmed by a urine check which shows increased amounts of the serotonin breakdown product, 5-hydroxyindoleacetic acid (5-HIAA). A test is performed after the patient has stayed away from serotonin-containing foods (for example, bananas, tomatoes, plums, avocados, pineapples, eggplant, and walnuts) for 3 days to avoid false-positive results. On the 3rd day, a 24-h urine sample is collected for testing. Normal excretion of 5-HIAA is < 10 mg/day (< 52 mol/day). In patients with carcinoid syndrome it is usually > 50 mg/day (> 260 mol/ day).  Tests with calcium gluconate, catecholamines, pentagastrin, or alcohol have been used to cause flushing. These may help in the diagnosis in some patients but must be performed with care.  Localization of the tumor may require an extensive evaluation, including laparotomy. A liver scan may help show spreading (metastases).  The rare tumors mentioned above must be excluded by the correct examinations. The tests first help diagnose the cancer and then determine if it has spread (staging).  TREATMENT  Treatment varies and depends on:  The location and size of the tumor.  Any spread of the cancer.  Your health, age, and preferences. Treatment may include:  Surgery.  Anticancer medications (chemotherapy).  Other medications. Treatments listed below may be used to control symptoms:  Antidiarrheal medications.  Medications to prevent serotonin production.  Cortisone medications to reduce inflammation.  Medications to prevent flushed skin.  Anticancer medications (they do not cure these tumors, but may help symptoms).  Avoid foods that trigger  symptoms.  Include at least 2 servings of protein a day.  Multivitamins and niacin supplements.  Do not drink alcohol.  Resume your normal activities once symptoms improve. Avoid strenuous exercise. Surgery is curative if the entire tumor can be removed. Sometimes it can spread to local lymph nodes (your glands), which is cured with surgical removal. If surgery is not completely successful, the symptoms can often be helped with the medications listed above. Chemotherapy or medical treatment of the tumor is usually unsuccessful.  RELATED COMPLICATIONS  High blood pressure.  Bowel obstruction.  Disease of the heart valves.  Renal failure.  Risk for stroke, blood clots, and similar disorders.  Heart failure.  Hives (angioedema). If the carcinoid tumor can be removed completely, there can be a cure. Even removing a large part of a tumor that has spread, helps cut down on symptoms. This is because there is less tumor size to produce the hormone causing the symptoms. FOR MORE INFORMATION [employer] Institute: www.cancer.gov Document Released: 02/13/2004 Document Revised: 09/23/2011 Document Reviewed: 06/24/2008 [facility] Patient Information 2014 [place], [place]. Hypokalemia Hypokalemia means a low potassium level in the blood.Potassium is an electrolyte that helps regulate the amount of fluid in the body. It also stimulates muscle contraction and maintains a stable acid-base balance.Most of the body's potassium is inside of cells, and only a very small amount is in the blood. Because the amount in the blood is so small, minor changes can have big effects. PREPARATION FOR TEST Testing for potassium requires taking a blood sample taken by needle from a vein in the arm. The skin is cleaned thoroughly before the sample is drawn. There is no other special preparation needed. NORMAL VALUES Potassium levels below 3.5 mEq/L are abnormally low. Levels above 5.1 mEq/L are abnormally  high. Ranges for normal findings may vary among different laboratories and hospitals. You should always check with your doctor after having lab work or other tests done to discuss the meaning of your test results and whether your values are considered within normal limits. MEANING OF TEST  Your caregiver will go over the test results with you and discuss the importance and meaning of your results, as well as treatment options and the need for additional tests, if necessary. A potassium level is frequently part of a routine medical exam. It is usually included as part of a whole "panel" of tests for several blood salts (such as Sodium and Chloride). It may be done as part of follow-up when a low potassium level was found in the past or other blood salts are suspected of being out of balance. A low potassium level might be suspected if you have one or more of the following:  Symptoms of weakness.  Abnormal heart rhythms.  High blood pressure and are taking medication to control this, especially water pills (diuretics).  Kidney disease that can affect your potassium level .  Diabetes requiring the use of insulin. The potassium may fall after taking insulin, especially if the diabetes had been out of control for a while.  A condition requiring the use of cortisone-type medication or certain types of antibiotics.  Vomiting and/or diarrhea for more than a day or two.  A stomach or intestinal condition that may not permit appropriate absorption of potassium.  Fainting episodes.  Mental confusion. OBTAINING TEST RESULTS It is your responsibility to obtain your test results. Ask the lab  or department performing the test when and how you will get your results.  Please contact your caregiver directly if you have not received the results within one week. At that time, ask if there is anything different or new you should be doing in relation to the results. TREATMENT Hypokalemia can be treated with  potassium supplements taken by mouth and/or adjustments in your current medications. A diet high in potassium is also helpful. Foods with high potassium content are:  Peas, lentils, lima beans, nuts, and dried fruit.  Whole grain and bran cereals and breads.  Fresh fruit, vegetables (bananas, cantaloupe, grapefruit, oranges, tomatoes, honeydew melons, potatoes).  Orange and tomato juices.  Meats. If potassium supplement has been prescribed for you today or your medications have been adjusted, see your personal caregiver in time02 for a re-check. SEEK MEDICAL CARE IF:  There is a feeling of worsening weakness.  You experience repeated chest palpitations.  You are diabetic and having difficulty keeping your blood sugars in the normal range.  You are experiencing vomiting and/or diarrhea.  You are having difficulty with any of your regular medications. SEEK IMMEDIATE MEDICAL CARE IF:  You experience chest pain, shortness of breath, or episodes of dizziness.  You have been having vomiting or diarrhea for more than 2 days.  You have a fainting episode. MAKE SURE YOU:   Understand these instructions.  Will watch your condition.  Will get help right away if you are not doing well or get worse. Document Released: 07/01/2005 Document Revised: 09/23/2011 Document Reviewed: 06/11/2008 [facility] Patient Information 2014 [place], [place].

## 2013-04-19 NOTE — Telephone Encounter (Signed)
gv and printed appt sched nad avs for pt for for NOV adn DEC

## 2013-04-19 NOTE — Progress Notes (Signed)
[facility] OFFICE PROGRESS NOTE  [place], PA-C 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 Carcinoid tumor of ileum    CURRENT THERAPY:Somatostatin LAR 30 mg IM was initiated on 07/07/2012 and is being administered monthly.   INTERVAL HISTORY: Vanessa Waller 57 y.o. female with an history of carcinoid tumor of ileum presents for followup. She was last seen at Dr. Patty Sermons on 01/25/2013 year today she is accompanied by her niece Victorino Dike. She denies any recent hospitalizations or emergency room visits she states that she feell well overall. She was plans to get the flu shot next week.  In addition she plans to followup on her Pap smear screening exam. She denies any abdominal pain she reports that her diarrhea is controlled with 1 Imodium daily. She has been receiving somatostatin LAR 30 mg IM every month. She continues to tolerate these shots without any problems. Her appetite and energy remains good.she had a CT of her chest abdomen and pelvis on 03/29/2013.   MEDICAL HISTORY: Past Medical History  Diagnosis Date  . 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 Cancer   . Nonspecific elevation of levels of transaminase or lactic acid dehydrogenase (LDH)   . Hypopotassemia   . Diverticulosis   . Hepatic steatosis 02/24/12  . Uterine fibroid   . Nephrolithiasis   . Small bowel obstruction   . 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    ALLERGIES:  is allergic to amoxicillin and cephalexin.  MEDICATIONS: has a current medication list which includes the following prescription(s): ascorbic acid, calcium carbonate-vitamin d, vitamin d3, multivitamin, octreotide, and potassium chloride sa, and the following Facility-Administered Medications: octreotide.  SURGICAL HISTORY:  Past Surgical History  Procedure Laterality Date  . Tubal ligation    . Carcinoid tumor resection      ileum   PROBLEM LIST:  1. 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 The patient underwent surgical resection of her carcinoid tumor on 08/17/2005. Two out of 13 lymph nodes were positive. The patient's stage was III. The patient was noted to have an elevated urinary 5HIAA level in July 2011. A positive octreotide scan was noted coming from the right lower pelvis adjacent to the bladder on 09/06/2010. Initially this was felt to be a uterine fibroid. The patient has continued to have positive octreotide scan on 11/07/2011 and 05/06/2012. CT scans of the abdomen and pelvis were also carried out on 03/02/2010, 02/24/2012  and 06/30/2012. These scans in conjunction with an elevated 5HIAA level of 22.7 on 04/13/2012 and a chromogranin A level which had risen to 18.0 on 03/23/2012 strongly suggested that the patient's disease had recurred. The patient required admission to the hospital from 06/30/2012 through 07/03/2012 for an episode of upper  abdominal pain which occurred suddenly the day prior  to admission. The patient was started on somatostatin LAR 30 mg IM on 07/07/2012 for recurrent disease involving the lateral segment of the left hepatic lobe, a right jejunal mesenteric node and 2  enhancing masses adjacent to the uterus. Twenty-four-hour urine 5HIAA on 10/19/2012 was 5.5. CT scan of the abdomen and pelvis with IV contrast on 10/19/2012 showed generally stable disease with the exception of a decrease in the right abdominal mesenteric lymphadenopathy. There have been 2 lesions in the liver which are felt to be stable. One lesion is in the lateral segment of the left hepatic lobe measuring 14 x 14 mm. The second lesion is seen in the lateral segment adjacent to the falciform ligament and measures 9 mm.  2. Uterine fibroids noted on imaging studies dating back to late April 2010.  3. Elevated liver transaminases noted in March 2007. The patient was evaluated by Dr. Lina Sar in October 2010 and felt that the etiology was hepatic steatosis.  4. Admission to the hospital for upper abdominal pain with sudden onset. Admission dates were 06/30/2012 through 07/03/2012. CT scan of the abdomen and pelvis with IV contrast on 06/30/2012 showed no definite etiology for the pain. It was noted that a lymph node in the right jejunal mesentery had developed central necrosis when compared with the prior study of 02/24/2012. There was no evidence for bowel obstruction. There has previously been no evidence for gallstones on recent CT scans, and an abdominal ultrasound dating back to 05/08/2009.  5. Possible mass involving the left breast on screening mammogram from 06/18/2012. A diagnostic mammogram and ultrasound of the left breast carried out on 07/17/2012 were normal. Followup screening mammogram was suggested in 1 year.  6. Diarrhea with onset in December 2013, somewhat improved with Imodium.    REVIEW OF SYSTEMS:   Constitutional: Denies fevers, chills or abnormal weight loss Eyes: Denies blurriness of vision Ears, nose, mouth, throat, and face: Denies mucositis or sore throat Respiratory: Denies cough, dyspnea or wheezes Cardiovascular: Denies palpitation, chest discomfort or lower extremity  swelling Gastrointestinal:  Denies nausea, heartburn or positive for diarrhea Skin: Denies abnormal skin rashes Lymphatics: Denies new lymphadenopathy or easy bruising Neurological:Denies numbness, tingling or new weaknesses Behavioral/Psych: Mood is stable, no new changes  All other systems were reviewed with the patient and are negative.  PHYSICAL EXAMINATION: ECOG PERFORMANCE STATUS: 0 - Asymptomatic  Blood pressure 143/84, pulse 84, temperature 97.8 F (36.6 C), temperature source Oral, resp. rate 20, height 5\' 2"  (1.575 m), weight 134 lb 12.8 oz (61.145 kg).  GENERAL:alert, no distress and comfortable, female appears her stated age SKIN: skin color, texture, turgor are normal, no rashes or significant lesions EYES: normal, Conjunctiva are pink and non-injected, sclera clear OROPHARYNX:no exudate, no erythema and lips, buccal mucosa, and tongue normal  NECK: supple, thyroid normal size, non-tender, without nodularity LYMPH:  no palpable lymphadenopathy in the cervical, axillary or supraclavicular LUNGS: clear to auscultation and percussion with normal breathing effort HEART: regular rate & rhythm and no murmurs and no lower extremity edema ABDOMEN:abdomen soft, non-tender and normal bowel sounds Musculoskeletal:no cyanosis of digits and no clubbing  NEURO: alert & oriented x 3 with fluent speech, no focal motor/sensory deficits   LABORATORY DATA: Results for orders placed in visit on 04/19/13 (from the past 48 hour(s))  CBC WITH DIFFERENTIAL     Status: None   Collection Time    04/19/13  8:08 AM      Result Value Range   WBC 5.0  3.9 - 10.3 10e3/uL   NEUT# 2.8  1.5 - 6.5 10e3/uL   HGB 14.8  11.6 - 15.9 g/dL   HCT 16.1  09.6 - 04.5 %   Platelets 233  145 - 400 10e3/uL   MCV 90.6  79.5 - 101.0 fL   MCH 30.3  25.1 - 34.0 pg   MCHC 33.5  31.5 - 36.0 g/dL   RBC 4.09  8.11 - 9.14 10e6/uL   RDW 12.5  11.2 - 14.5 %   lymph# 1.8  0.9 - 3.3 10e3/uL   MONO# 0.2  0.1 - 0.9 10e3/uL    Eosinophils Absolute 0.1  0.0 - 0.5 10e3/uL   Basophils Absolute 0.1  0.0 - 0.1 10e3/uL   NEUT% 57.0  38.4 - 76.8 %   LYMPH% 36.1  14.0 - 49.7 %   MONO% 4.6  0.0 - 14.0 %   EOS% 1.1  0.0 - 7.0 %   BASO% 1.2  0.0 - 2.0 %  COMPREHENSIVE METABOLIC PANEL (CC13)     Status: Abnormal   Collection Time    04/19/13  8:08 AM      Result Value Range   Sodium 142  136 - 145 mEq/L   Potassium 3.3 (*) 3.5 - 5.1 mEq/L   Chloride 107  98 - 109 mEq/L   CO2 25  22 - 29 mEq/L   Glucose 155 (*) 70 - 140 mg/dl   BUN 78.2  7.0 - 95.6 mg/dL   Creatinine 0.9  0.6 - 1.1 mg/dL   Total Bilirubin 2.13  0.20 - 1.20 mg/dL   Alkaline Phosphatase 71  40 - 150 U/L   AST 58 (*) 5 - 34 U/L   ALT 72 (*) 0 - 55 U/L   Total Protein 7.8  6.4 - 8.3 g/dL   Albumin 4.0  3.5 - 5.0 g/dL   Calcium 9.4  8.4 - 08.6 mg/dL       Labs:  Lab Results  Component Value Date   WBC 5.0 04/19/2013   HGB 14.8 04/19/2013   HCT 44.2 04/19/2013   MCV 90.6 04/19/2013   PLT 233 04/19/2013   NEUTROABS 2.8 04/19/2013      Chemistry      Component Value Date/Time   NA 142 04/19/2013 0808   NA 135 07/01/2012 0357   NA 137 10/01/2010 0000   K 3.3* 04/19/2013 0808   K 4.0 07/01/2012 0357   K 4.1 10/01/2010 0000   CL 102 12/28/2012 0824   CL 99 07/01/2012 0357   CL 100 10/01/2010 0000   CO2 25 04/19/2013 0808   CO2 25 07/01/2012 0357   CO2 28 10/01/2010 0000   BUN 10.8 04/19/2013 0808   BUN 10 07/01/2012 0357   BUN 13 10/01/2010 0000   CREATININE 0.9 04/19/2013 0808   CREATININE 0.82 07/01/2012 0357   CREATININE 0.83 10/07/2011 0817   CREATININE 0.9 10/01/2010 0000      Component Value Date/Time   CALCIUM 9.4 04/19/2013 0808   CALCIUM 9.0 07/01/2012 0357   CALCIUM 9.3 10/01/2010 0000   ALKPHOS 71 04/19/2013 0808   ALKPHOS 54 07/01/2012 0357   ALKPHOS 64 10/01/2010 0000   AST 58* 04/19/2013 0808   AST 31 07/01/2012 0357   AST 80* 10/01/2010 0000   ALT 72* 04/19/2013 0808   ALT 33 07/01/2012 0357   ALT 90* 10/01/2010 0000   BILITOT  0.63 04/19/2013 0808   BILITOT 0.7 07/01/2012 0357   BILITOT 0.50 10/01/2010 0000  CBC:  Recent Labs Lab 04/19/13 0808  WBC 5.0  NEUTROABS 2.8  HGB 14.8  HCT 44.2  MCV 90.6  PLT 233     RADIOGRAPHIC STUDIES: Ct Abdomen Pelvis W Wo Contrast  03/29/2013   *RADIOLOGY REPORT*  Clinical Data:  Followup metastatic carcinoid  CT CHEST, ABDOMEN AND PELVIS WITH CONTRAST  Technique:  Multidetector CT imaging of the chest, abdomen and pelvis was performed following the standard protocol during bolus administration of intravenous contrast.  Contrast: OMNIPAQUE IOHEXOL 300 MG/ML  SOLN  Comparison:  10/19/2012  CT CHEST  Findings:  There is no pleural effusion identified.  The no airspace consolidation noted.  No pulmonary parenchymal nodule or mass noted.  Heart size is normal.  No pericardial or pleural effusion identified.  No enlarged axillary or supraclavicular adenopathy.  Review of the visualized bony structures is negative for lytic or sclerotic bone lesion.  IMPRESSION:  1. No evidence for metastatic disease.  CT ABDOMEN AND PELVIS  Findings:  There is diffuse fatty infiltration of the liver.  There is a focal fatty sparing are identified adjacent to the gallbladder fossa.  Hypervascular liver lesion in the lateral segment of left hepatic lobe measures 1.4 cm, image 20/series 3.  This is unchanged from previous exam.  The subcapsular lesion overlying the inferior right hepatic lobe measures 0.9 cm, image 36/series 3.  This is also unchanged from previous exam.  The left hepatic lobe lesion adjacent to the falciform ligament measures 1.2 cm, image 29/series 3.  This is also unchanged.  No new liver lesions identified.  The gallbladder is normal.  No biliary dilatation.  Normal appearance of the pancreas.  The spleen is normal.  Normal appearance of both adrenal glands.  The right kidney is normal.  The left kidney is also normal.  Urinary bladder appears within normal limits.  Fibroid  uterus is again identified. The heterogeneously enhancing mass in the right adnexa is stable measuring 4.7 cm, image 164/series 5.  The left adnexal lesion measures 4.2 cm, image 169/series 5.  Previously 4.1 cm.  The small hypervascular lesion within the cul-de-sac measures 1.9 cm, image 171/series 5.  This is stable from previous exam.  Small enhancing nodule along the undersurface of the left rectus muscle measures 0.8 cm, image 158/series 5.  This is stable from previous exam.  In the right iliac fossa there is a nodule which measures 1.7 cm, image 156/series 5.  This is unchanged from previous exam.  No new or enlarging peritoneal lesions identified.  The stomach appears normal.  The small bowel loops have a normal course and caliber.  There is no obstruction.  Review of the visualized osseous structures is sig significant for mild lumbar spondylosis.  No aggressive lytic or sclerotic bone lesions identified.  IMPRESSION:  1.   No acute findings within the abdomen or pelvis. 2.   Stable peritoneal disease. 3.   Hypervascular liver lesions are unchanged from previous exam.   Original Report Authenticated By: Signa Kell, M.D.   Ct Chest W Contrast  03/29/2013   *RADIOLOGY REPORT*  Clinical Data:  Followup metastatic carcinoid  CT CHEST, ABDOMEN AND PELVIS WITH CONTRAST  Technique:  Multidetector CT imaging of the chest, abdomen and pelvis was performed following the standard protocol during bolus administration of intravenous contrast.  Contrast: OMNIPAQUE IOHEXOL 300 MG/ML  SOLN  Comparison:  10/19/2012  CT CHEST  Findings:  There is no pleural effusion identified.  The no airspace consolidation noted.  No pulmonary parenchymal nodule or mass noted.  Heart size is normal.  No pericardial or pleural effusion identified.  No enlarged axillary or supraclavicular adenopathy.  Review of the visualized bony structures is negative for lytic or sclerotic bone lesion.  IMPRESSION:  1. No evidence for  metastatic disease.  CT ABDOMEN AND PELVIS  Findings:  There is diffuse fatty infiltration of the liver.  There is a focal fatty sparing are identified adjacent to the gallbladder fossa.  Hypervascular liver lesion in the lateral segment of left hepatic lobe measures 1.4 cm, image 20/series 3.  This is unchanged from previous exam.  The subcapsular lesion overlying the inferior right hepatic lobe measures 0.9 cm, image 36/series 3.  This is also unchanged from previous exam.  The left hepatic lobe lesion adjacent to the falciform ligament measures 1.2 cm, image 29/series 3.  This is also unchanged.  No new liver lesions identified.  The gallbladder is normal.  No biliary dilatation.  Normal appearance of the pancreas.  The spleen is normal.  Normal appearance of both adrenal glands.  The right kidney is normal.  The left kidney is also normal.  Urinary bladder appears within normal limits.  Fibroid uterus is again identified. The heterogeneously enhancing mass in the right adnexa is stable measuring 4.7 cm, image 164/series 5.  The left adnexal lesion measures 4.2 cm, image 169/series 5.  Previously 4.1 cm.  The small hypervascular lesion within the cul-de-sac measures 1.9 cm, image 171/series 5.  This is stable from previous exam.  Small enhancing nodule along the undersurface of the left rectus muscle measures 0.8 cm, image 158/series 5.  This is stable from previous exam.  In the right iliac fossa there is a nodule which measures 1.7 cm, image 156/series 5.  This is unchanged from previous exam.  No new or enlarging peritoneal lesions identified.  The stomach appears normal.  The small bowel loops have a normal course and caliber.  There is no obstruction.  Review of the visualized osseous structures is sig significant for mild lumbar spondylosis.  No aggressive lytic or sclerotic bone lesions identified.  IMPRESSION:  1.   No acute findings within the abdomen or pelvis. 2.   Stable peritoneal disease. 3.    Hypervascular liver lesions are unchanged from previous exam.   Original Report Authenticated By: Signa Kell, M.D.    ASSESSMENT: Vanessa Waller 57 y.o. female with a history of Carcinoid tumor of ileum - Plan: CBC with Differential, Comprehensive metabolic panel, Chromogranin A, CT Abdomen Pelvis W Contrast, CT Chest W Contrast, 5 HIAA, quantitative, urine, 24 hour  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 - Plan: CBC with Differential, Comprehensive metabolic panel, Chromogranin A  Left breast mass  Hypokalemia - Plan: potassium chloride SA (K-DUR,KLOR-CON) 20 MEQ tablet  TRANSAMINASES, SERUM, ELEVATED   PLAN: 1. carcinoid tumor of ileum.  --We reviewed her CT chest abdomen pelvis taken on 03/29/2013 extensively. It demonstrates stable disease with no evidence of metastatic progression or new metastasis. Therefore, we plan to continue the somatostatin LAR 30 mg IM injections every month. We will plan to see her back in 3 months at which time we'll check CBC, chemistries, chromogranin A level, and 5 HIAA 24 hour urine.  Addition we will restage with a CT chest abdomen pelvis prior to the next visit.  If this CT remains stable we might consider scanning every 6 months with the next year.   2. Hypokalemia. -We counseled the patient to continue  for oral potassium supplementation and to increase it to daily for the next 5 days. She was also provided a handout on hypokalemia.  Her hypokalemia could in part be due to decreased by mouth potassium and/or diarrhea.   3. fatty liver/elevated transaminases. -Her liver enzymes today were mildly elevated but within range. We will repeat comprehensive metabolic panel her next visit.   4. cancer screening. --Patient will have her annual Pap scheduled.  He reports being up-to-date for her mammogram and colonoscopy.  All questions were answered. The patient knows to call the clinic with any problems, questions or concerns. We can certainly  see the patient much sooner if necessary. She was also provided and after visit summary  I spent 15 minutes counseling the patient face to face. The total time spent in the appointment was 25 minutes.    Hanan Moen, MD 04/19/2013 9:20 AM

## 2013-04-26 ENCOUNTER — Telehealth: Payer: Self-pay

## 2013-04-26 NOTE — Telephone Encounter (Signed)
S/w pt that her K+ was 3.3 on her last 2 visits. Dr Rosie Fate stated to keep taking every other day until next lab draw on 11/3. Discussed K+ rich foods but pt stated that some of these were also what aggravated her diarrhea. She said she would try to make a comparison chart to figure out what is best for her foodwise.

## 2013-04-27 ENCOUNTER — Other Ambulatory Visit: Payer: 59 | Admitting: Lab

## 2013-05-04 ENCOUNTER — Ambulatory Visit: Payer: 59

## 2013-05-07 ENCOUNTER — Telehealth: Payer: Self-pay

## 2013-05-07 NOTE — Telephone Encounter (Signed)
Pt called stating her potassium was 4.6 at [facility] last week. Asking if she should continue her potassium every other day. S/w Dr Rosie Fate and told pt to hold her potassium. If she gets diarrhea again to go ahead and take a dose. She spoke back understanding. Next lab appt 11/3 confirmed w/pt. Last progress note faxed to [facility] per pt request.

## 2013-05-17 ENCOUNTER — Other Ambulatory Visit (HOSPITAL_BASED_OUTPATIENT_CLINIC_OR_DEPARTMENT_OTHER): Payer: 59 | Admitting: Lab

## 2013-05-17 ENCOUNTER — Ambulatory Visit (HOSPITAL_BASED_OUTPATIENT_CLINIC_OR_DEPARTMENT_OTHER): Payer: 59

## 2013-05-17 VITALS — BP 153/80 | HR 85 | Temp 97.5°F

## 2013-05-17 DIAGNOSIS — D3A012 Benign carcinoid tumor of the ileum: Secondary | ICD-10-CM

## 2013-05-17 DIAGNOSIS — E340 Carcinoid syndrome: Secondary | ICD-10-CM

## 2013-05-17 LAB — COMPREHENSIVE METABOLIC PANEL (CC13)
Albumin: 4 g/dL (ref 3.5–5.0)
Alkaline Phosphatase: 76 U/L (ref 40–150)
Anion Gap: 14 mEq/L — ABNORMAL HIGH (ref 3–11)
BUN: 9.4 mg/dL (ref 7.0–26.0)
CO2: 21 mEq/L — ABNORMAL LOW (ref 22–29)
Creatinine: 0.9 mg/dL (ref 0.6–1.1)
Glucose: 177 mg/dl — ABNORMAL HIGH (ref 70–140)
Potassium: 3.9 mEq/L (ref 3.5–5.1)
Total Protein: 7.6 g/dL (ref 6.4–8.3)

## 2013-05-17 LAB — LACTATE DEHYDROGENASE (CC13): LDH: 262 U/L — ABNORMAL HIGH (ref 125–245)

## 2013-05-17 MED ORDER — OCTREOTIDE ACETATE 30 MG IM KIT
30.0000 mg | PACK | Freq: Once | INTRAMUSCULAR | Status: AC
  Administered 2013-05-17: 30 mg via INTRAMUSCULAR
  Filled 2013-05-17: qty 1

## 2013-05-20 ENCOUNTER — Other Ambulatory Visit: Payer: Self-pay

## 2013-05-21 ENCOUNTER — Telehealth: Payer: Self-pay

## 2013-05-21 NOTE — Telephone Encounter (Signed)
S/w pt, she is taking potassium every other day, her K+ is 3.9. Continue and verified next appt on 12/1.

## 2013-06-14 ENCOUNTER — Other Ambulatory Visit: Payer: Self-pay | Admitting: Internal Medicine

## 2013-06-14 ENCOUNTER — Other Ambulatory Visit: Payer: 59 | Admitting: Lab

## 2013-06-14 ENCOUNTER — Ambulatory Visit (HOSPITAL_BASED_OUTPATIENT_CLINIC_OR_DEPARTMENT_OTHER): Payer: 59

## 2013-06-14 VITALS — BP 138/76 | HR 94 | Temp 98.7°F

## 2013-06-14 DIAGNOSIS — E340 Carcinoid syndrome: Secondary | ICD-10-CM

## 2013-06-14 DIAGNOSIS — D3A012 Benign carcinoid tumor of the ileum: Secondary | ICD-10-CM

## 2013-06-14 MED ORDER — OCTREOTIDE ACETATE 30 MG IM KIT
30.0000 mg | PACK | Freq: Once | INTRAMUSCULAR | Status: AC
  Administered 2013-06-14: 30 mg via INTRAMUSCULAR
  Filled 2013-06-14: qty 1

## 2013-06-21 ENCOUNTER — Other Ambulatory Visit: Payer: Self-pay | Admitting: Internal Medicine

## 2013-06-21 ENCOUNTER — Ambulatory Visit: Payer: 59 | Admitting: Lab

## 2013-06-21 ENCOUNTER — Ambulatory Visit (HOSPITAL_COMMUNITY)
Admission: RE | Admit: 2013-06-21 | Discharge: 2013-06-21 | Disposition: A | Discharge status: Home / Self Care | Payer: 59 | Source: Ambulatory Visit | Attending: Internal Medicine | Admitting: Internal Medicine

## 2013-06-21 ENCOUNTER — Telehealth: Payer: Self-pay

## 2013-06-21 DIAGNOSIS — C179 Malignant neoplasm of small intestine, unspecified: Secondary | ICD-10-CM | POA: Insufficient documentation

## 2013-06-21 DIAGNOSIS — D3A012 Benign carcinoid tumor of the ileum: Secondary | ICD-10-CM

## 2013-06-21 DIAGNOSIS — K7689 Other specified diseases of liver: Secondary | ICD-10-CM | POA: Insufficient documentation

## 2013-06-21 DIAGNOSIS — C786 Secondary malignant neoplasm of retroperitoneum and peritoneum: Secondary | ICD-10-CM | POA: Insufficient documentation

## 2013-06-21 MED ORDER — IOHEXOL 300 MG/ML  SOLN
100.0000 mL | Freq: Once | INTRAMUSCULAR | Status: AC | PRN
  Administered 2013-06-21: 100 mL via INTRAVENOUS

## 2013-06-21 NOTE — Telephone Encounter (Signed)
S/w pt that the add on lab appt today was so we could process the 24 hour urine she brought in this AM. Pt stated that last Monday (12/1) she sat in lobby for 20 minutes, asked if they were running behind and was told her lab appt had been cancelled and she just got her shot. When she saw Amadeo Garnet said she had paged her 2-3 times. Her beeper had not gone off. She feels she wasted about 1 hour of her time waiting when she could have been working.

## 2013-06-24 ENCOUNTER — Telehealth: Payer: Self-pay | Admitting: Internal Medicine

## 2013-06-24 LAB — 5 HIAA, QUANTITATIVE, URINE, 24 HOUR
5-HIAA, 24 Hr Urine: 4.5 mg/24 h (ref <=6.0)
Volume, Urine-5HIAA: 1400 mL/24 h

## 2013-06-24 NOTE — Telephone Encounter (Signed)
I left the patient know that her scans look stable.  We will see her on 07/12/2013.

## 2013-07-12 ENCOUNTER — Other Ambulatory Visit: Payer: Self-pay | Admitting: Internal Medicine

## 2013-07-12 ENCOUNTER — Telehealth: Payer: Self-pay | Admitting: *Deleted

## 2013-07-12 ENCOUNTER — Ambulatory Visit (HOSPITAL_BASED_OUTPATIENT_CLINIC_OR_DEPARTMENT_OTHER): Payer: 59

## 2013-07-12 ENCOUNTER — Other Ambulatory Visit (HOSPITAL_BASED_OUTPATIENT_CLINIC_OR_DEPARTMENT_OTHER): Payer: 59

## 2013-07-12 ENCOUNTER — Ambulatory Visit (HOSPITAL_BASED_OUTPATIENT_CLINIC_OR_DEPARTMENT_OTHER): Payer: 59 | Admitting: Internal Medicine

## 2013-07-12 VITALS — BP 147/90 | HR 95 | Temp 97.6°F | Resp 18 | Ht 62.0 in | Wt 134.2 lb

## 2013-07-12 DIAGNOSIS — D3A012 Benign carcinoid tumor of the ileum: Secondary | ICD-10-CM

## 2013-07-12 DIAGNOSIS — K760 Fatty (change of) liver, not elsewhere classified: Secondary | ICD-10-CM

## 2013-07-12 DIAGNOSIS — N63 Unspecified lump in unspecified breast: Secondary | ICD-10-CM

## 2013-07-12 DIAGNOSIS — K7689 Other specified diseases of liver: Secondary | ICD-10-CM

## 2013-07-12 DIAGNOSIS — R7401 Elevation of levels of liver transaminase levels: Secondary | ICD-10-CM

## 2013-07-12 DIAGNOSIS — N6320 Unspecified lump in the left breast, unspecified quadrant: Secondary | ICD-10-CM

## 2013-07-12 DIAGNOSIS — C7B09 Secondary carcinoid tumors of other sites: Secondary | ICD-10-CM

## 2013-07-12 LAB — COMPREHENSIVE METABOLIC PANEL (CC13)
ALT: 131 U/L — ABNORMAL HIGH (ref 0–55)
Alkaline Phosphatase: 81 U/L (ref 40–150)
Anion Gap: 11 mEq/L (ref 3–11)
BUN: 7.4 mg/dL (ref 7.0–26.0)
CO2: 28 mEq/L (ref 22–29)
Calcium: 9.8 mg/dL (ref 8.4–10.4)
Chloride: 104 mEq/L (ref 98–109)
Creatinine: 0.9 mg/dL (ref 0.6–1.1)
Glucose: 117 mg/dl (ref 70–140)
Total Bilirubin: 0.56 mg/dL (ref 0.20–1.20)

## 2013-07-12 LAB — CBC WITH DIFFERENTIAL/PLATELET
BASO%: 1.2 % (ref 0.0–2.0)
Basophils Absolute: 0.1 10*3/uL (ref 0.0–0.1)
HCT: 46.6 % (ref 34.8–46.6)
HGB: 15.5 g/dL (ref 11.6–15.9)
LYMPH%: 36.5 % (ref 14.0–49.7)
MCHC: 33.1 g/dL (ref 31.5–36.0)
MONO#: 0.3 10*3/uL (ref 0.1–0.9)
MONO%: 5.2 % (ref 0.0–14.0)
NEUT%: 56.6 % (ref 38.4–76.8)
Platelets: 261 10*3/uL (ref 145–400)
WBC: 4.9 10*3/uL (ref 3.9–10.3)
lymph#: 1.8 10*3/uL (ref 0.9–3.3)

## 2013-07-12 MED ORDER — OCTREOTIDE ACETATE 30 MG IM KIT
30.0000 mg | PACK | Freq: Once | INTRAMUSCULAR | Status: AC
  Administered 2013-07-12: 30 mg via INTRAMUSCULAR
  Filled 2013-07-12: qty 1

## 2013-07-12 NOTE — Patient Instructions (Signed)

## 2013-07-12 NOTE — Progress Notes (Signed)
[facility] OFFICE PROGRESS NOTE  Lovenia Kim, PA-C [address] [place] [address]  DIAGNOSIS: Carcinoid tumor of ileum - Plan: CBC with Differential, Comprehensive metabolic panel (Cmet) - [facility], Lactate dehydrogenase (LDH) - [facility], Chromogranin A, CT Abdomen Pelvis W Contrast  TRANSAMINASES, SERUM, ELEVATED - Plan: Ambulatory referral to Gastroenterology  Hepatic steatosis - Plan: Ambulatory referral to Gastroenterology  Left breast mass  Chief Complaint  Patient presents with  . Carcinoid tumor of ileum    CURRENT THERAPY:Somatostatin LAR 30 mg IM was initiated on 07/07/2012 and is being administered monthly.  INTERVAL HISTORY: Vanessa Waller 57 y.o. female with an history of carcinoid tumor of ileum presents for followup. She was last seen at me on 04/19/2013. She denies any recent hospitalizations or emergency room visits she states that she feell well overall. In addition she plans to followup on her Pap smear screening exam. She denies any abdominal pain she reports that her diarrhea is controlled with 1 Imodium daily. She has been receiving somatostatin LAR 30 mg IM every month. She continues to tolerate these shots without any problems. Her appetite and energy remains good.she had a CT of her chest abdomen and pelvis on 06/21/2013.   MEDICAL HISTORY: Past Medical History  Diagnosis Date  . 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 Cancer   . Nonspecific elevation of levels of transaminase or lactic acid dehydrogenase (LDH)   . Hypopotassemia   . Diverticulosis   . Hepatic steatosis 02/24/12  . Uterine fibroid   . Nephrolithiasis   . Small bowel obstruction   . 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     liver,mesentery,pelvis    INTERIM HISTORY: has TRANSAMINASES, SERUM, ELEVATED; Carcinoid tumor of ileum; Hepatic steatosis; Left breast mass; Diarrhea; Hypokalemia; and 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 on her  problem list.    ALLERGIES:  is allergic to amoxicillin and cephalexin.  MEDICATIONS: has a current medication list which includes the following prescription(s): ascorbic acid, calcium carbonate-vitamin d, vitamin d3, loperamide, multivitamin, octreotide, and potassium chloride sa.  SURGICAL HISTORY:  Past Surgical History  Procedure Laterality Date  . Tubal ligation    . Carcinoid tumor resection      ileum   PROBLEM LIST:  1. 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 The patient underwent surgical resection of her carcinoid tumor on 08/17/2005. Two out of 13 lymph nodes were positive. The patient's stage was III. The patient was noted to have an elevated urinary 5HIAA level in July 2011. A positive octreotide scan was noted coming from the right lower pelvis adjacent to the bladder on 09/06/2010. Initially this was felt to be a uterine fibroid. The patient has continued to have positive octreotide scan on 11/07/2011 and 05/06/2012. CT scans of the abdomen and pelvis were also carried out on 03/02/2010, 02/24/2012  and 06/30/2012. These scans in conjunction with an elevated 5HIAA level of 22.7 on 04/13/2012 and a chromogranin A level which had risen to 18.0 on 03/23/2012 strongly suggested that the patient's disease had recurred. The patient required admission to the hospital from 06/30/2012 through 07/03/2012 for an episode of upper  abdominal pain which occurred suddenly the day prior to admission. The patient was started on somatostatin LAR 30 mg IM on 07/07/2012 for recurrent disease involving the lateral segment of the left hepatic lobe, a right jejunal mesenteric node and 2 enhancing masses adjacent to the uterus. Twenty-four-hour urine 5HIAA on  10/19/2012 was 5.5. CT scan of the abdomen and pelvis with IV contrast on 10/19/2012 showed generally stable disease with the exception of a decrease in the right abdominal mesenteric lymphadenopathy. There have  been 2 lesions in the liver which are felt to be stable. One lesion is in the lateral segment of the left hepatic lobe measuring 14 x 14 mm. The second lesion is seen in the lateral segment adjacent to the falciform ligament and measures 9 mm.  2. Uterine fibroids noted on imaging studies dating back to late April 2010.  3. Elevated liver transaminases noted in March 2007. The patient was evaluated by Dr. Lina Sar in October 2010 and felt that the etiology was hepatic steatosis.  4. Admission to the hospital for upper abdominal pain with sudden onset. Admission dates were 06/30/2012 through 07/03/2012. CT scan of the abdomen and pelvis with IV contrast on 06/30/2012 showed no definite etiology for the pain. It was noted that a lymph node in the right jejunal mesentery had developed central necrosis when compared with the prior study of 02/24/2012. There was no evidence for bowel obstruction. There has previously been no evidence for gallstones on recent CT scans, and an abdominal ultrasound dating back to 05/08/2009.  5. Possible mass involving the left breast on screening mammogram from 06/18/2012. A diagnostic mammogram and ultrasound of the left breast carried out on 07/17/2012 were normal. Followup screening mammogram was suggested in 1 year.  6. Diarrhea with onset in December 2013, somewhat improved with Imodium.    REVIEW OF SYSTEMS:   Constitutional: Denies fevers, chills or abnormal weight loss Eyes: Denies blurriness of vision Ears, nose, mouth, throat, and face: Denies mucositis or sore throat Respiratory: Denies cough, dyspnea or wheezes Cardiovascular: Denies palpitation, chest discomfort or lower extremity swelling Gastrointestinal:  Denies nausea, heartburn or positive for diarrhea Skin: Denies abnormal skin rashes Lymphatics: Denies new lymphadenopathy or easy bruising Neurological:Denies numbness, tingling or new weaknesses Behavioral/Psych: Mood is stable, no new changes  All  other systems were reviewed with the patient and are negative.  PHYSICAL EXAMINATION: ECOG PERFORMANCE STATUS: 0 - Asymptomatic  Blood pressure 147/90, pulse 95, temperature 97.6 F (36.4 C), temperature source Oral, resp. rate 18, height 5\' 2"  (1.575 m), weight 134 lb 3.2 oz (60.873 kg).  GENERAL:alert, no distress and comfortable, female appears her stated age SKIN: skin color, texture, turgor are normal, no rashes or significant lesions EYES: normal, Conjunctiva are pink and non-injected, sclera clear OROPHARYNX:no exudate, no erythema and lips, buccal mucosa, and tongue normal  NECK: supple, thyroid normal size, non-tender, without nodularity LYMPH:  no palpable lymphadenopathy in the cervical, axillary or supraclavicular LUNGS: clear to auscultation and percussion with normal breathing effort HEART: regular rate & rhythm and no murmurs and no lower extremity edema ABDOMEN:abdomen soft, non-tender and normal bowel sounds; midline abdominal scar well-healed.  Musculoskeletal:no cyanosis of digits and no clubbing  NEURO: alert & oriented x 3 with fluent speech, no focal motor/sensory deficits   LABORATORY DATA: Results for orders placed in visit on 07/12/13 (from the past 48 hour(s))  CBC WITH DIFFERENTIAL     Status: None   Collection Time    07/12/13  8:00 AM      Result Value Range   WBC 4.9  3.9 - 10.3 10e3/uL   NEUT# 2.8  1.5 - 6.5 10e3/uL   HGB 15.5  11.6 - 15.9 g/dL   HCT 09.8  11.9 - 14.7 %   Platelets 261  145 -  400 10e3/uL   MCV 92.0  79.5 - 101.0 fL   MCH 30.5  25.1 - 34.0 pg   MCHC 33.1  31.5 - 36.0 g/dL   RBC 4.09  8.11 - 9.14 10e6/uL   RDW 12.3  11.2 - 14.5 %   lymph# 1.8  0.9 - 3.3 10e3/uL   MONO# 0.3  0.1 - 0.9 10e3/uL   Eosinophils Absolute 0.0  0.0 - 0.5 10e3/uL   Basophils Absolute 0.1  0.0 - 0.1 10e3/uL   NEUT% 56.6  38.4 - 76.8 %   LYMPH% 36.5  14.0 - 49.7 %   MONO% 5.2  0.0 - 14.0 %   EOS% 0.5  0.0 - 7.0 %   BASO% 1.2  0.0 - 2.0 %  COMPREHENSIVE  METABOLIC PANEL ([facility]13)     Status: Abnormal   Collection Time    07/12/13  8:00 AM      Result Value Range   Sodium 143  136 - 145 mEq/L   Potassium 3.5  3.5 - 5.1 mEq/L   Chloride 104  98 - 109 mEq/L   CO2 28  22 - 29 mEq/L   Glucose 117  70 - 140 mg/dl   BUN 7.4  7.0 - 78.2 mg/dL   Creatinine 0.9  0.6 - 1.1 mg/dL   Total Bilirubin 9.56  0.20 - 1.20 mg/dL   Alkaline Phosphatase 81  40 - 150 U/L   AST 116 (*) 5 - 34 U/L   ALT 131 (*) 0 - 55 U/L   Total Protein 8.5 (*) 6.4 - 8.3 g/dL   Albumin 4.5  3.5 - 5.0 g/dL   Calcium 9.8  8.4 - 21.3 mg/dL   Anion Gap 11  3 - 11 mEq/L       Labs:  Lab Results  Component Value Date   WBC 4.9 07/12/2013   HGB 15.5 07/12/2013   HCT 46.6 07/12/2013   MCV 92.0 07/12/2013   PLT 261 07/12/2013   NEUTROABS 2.8 07/12/2013      Chemistry      Component Value Date/Time   NA 143 07/12/2013 0800   NA 135 07/01/2012 0357   NA 137 10/01/2010 0000   K 3.5 07/12/2013 0800   K 4.0 07/01/2012 0357   K 4.1 10/01/2010 0000   CL 102 12/28/2012 0824   CL 99 07/01/2012 0357   CL 100 10/01/2010 0000   CO2 28 07/12/2013 0800   CO2 25 07/01/2012 0357   CO2 28 10/01/2010 0000   BUN 7.4 07/12/2013 0800   BUN 10 07/01/2012 0357   BUN 13 10/01/2010 0000   CREATININE 0.9 07/12/2013 0800   CREATININE 0.82 07/01/2012 0357   CREATININE 0.83 10/07/2011 0817   CREATININE 0.9 10/01/2010 0000      Component Value Date/Time   CALCIUM 9.8 07/12/2013 0800   CALCIUM 9.0 07/01/2012 0357   CALCIUM 9.3 10/01/2010 0000   ALKPHOS 81 07/12/2013 0800   ALKPHOS 54 07/01/2012 0357   ALKPHOS 64 10/01/2010 0000   AST 116* 07/12/2013 0800   AST 31 07/01/2012 0357   AST 80* 10/01/2010 0000   ALT 131* 07/12/2013 0800   ALT 33 07/01/2012 0357   ALT 90* 10/01/2010 0000   BILITOT 0.56 07/12/2013 0800   BILITOT 0.7 07/01/2012 0357   BILITOT 0.50 10/01/2010 0000       CBC:  Recent Labs Lab 07/12/13 0800  WBC 4.9  NEUTROABS 2.8  HGB 15.5  HCT 46.6  MCV 92.0  PLT  261      RADIOGRAPHIC STUDIES: Ct Abdomen Pelvis W Wo Contrast  03/29/2013   *RADIOLOGY REPORT*  Clinical Data:  Followup metastatic carcinoid  CT CHEST, ABDOMEN AND PELVIS WITH CONTRAST  Technique:  Multidetector CT imaging of the chest, abdomen and pelvis was performed following the standard protocol during bolus administration of intravenous contrast.  Contrast: OMNIPAQUE IOHEXOL 300 MG/ML  SOLN  Comparison:  10/19/2012  CT CHEST  Findings:  There is no pleural effusion identified.  The no airspace consolidation noted.  No pulmonary parenchymal nodule or mass noted.  Heart size is normal.  No pericardial or pleural effusion identified.  No enlarged axillary or supraclavicular adenopathy.  Review of the visualized bony structures is negative for lytic or sclerotic bone lesion.  IMPRESSION:  1. No evidence for metastatic disease.  CT ABDOMEN AND PELVIS  Findings:  There is diffuse fatty infiltration of the liver.  There is a focal fatty sparing are identified adjacent to the gallbladder fossa.  Hypervascular liver lesion in the lateral segment of left hepatic lobe measures 1.4 cm, image 20/series 3.  This is unchanged from previous exam.  The subcapsular lesion overlying the inferior right hepatic lobe measures 0.9 cm, image 36/series 3.  This is also unchanged from previous exam.  The left hepatic lobe lesion adjacent to the falciform ligament measures 1.2 cm, image 29/series 3.  This is also unchanged.  No new liver lesions identified.  The gallbladder is normal.  No biliary dilatation.  Normal appearance of the pancreas.  The spleen is normal.  Normal appearance of both adrenal glands.  The right kidney is normal.  The left kidney is also normal.  Urinary bladder appears within normal limits.  Fibroid uterus is again identified. The heterogeneously enhancing mass in the right adnexa is stable measuring 4.7 cm, image 164/series 5.  The left adnexal lesion measures 4.2 cm, image 169/series 5.  Previously  4.1 cm.  The small hypervascular lesion within the cul-de-sac measures 1.9 cm, image 171/series 5.  This is stable from previous exam.  Small enhancing nodule along the undersurface of the left rectus muscle measures 0.8 cm, image 158/series 5.  This is stable from previous exam.  In the right iliac fossa there is a nodule which measures 1.7 cm, image 156/series 5.  This is unchanged from previous exam.  No new or enlarging peritoneal lesions identified.  The stomach appears normal.  The small bowel loops have a normal course and caliber.  There is no obstruction.  Review of the visualized osseous structures is sig significant for mild lumbar spondylosis.  No aggressive lytic or sclerotic bone lesions identified.  IMPRESSION:  1.   No acute findings within the abdomen or pelvis. 2.   Stable peritoneal disease. 3.   Hypervascular liver lesions are unchanged from previous exam.   Original Report Authenticated By: Signa Kell, M.D.   Ct Chest Abdomen and pelvis W Contrast  06/21/2013  CT CHEST FINDINGS Small axillary lymph nodes are similar to prior. No supraclavicular  lymph nodes. No mediastinal or hilar lymph nodes. No pericardial fluid. Review of the lung parenchyma demonstrates no suspicious pulmonary nodules. Airways are normal. CT ABDOMEN AND PELVIS FINDINGS There is again demonstrated a hypervascular lesions in the left hepatic lobe. Lesion in the left lateral hepatic lobe measures 14 mm unchanged from 14 mm on prior (image 23, series 2). Small lesion  along the falciform ligament measures 8 mm (image 31, series 2) unchanged from 8 mm  on prior. Increased vascularity along the gallbladder fossa (image 30, 35 and 41 for example) is unchanged.  There are no new enhancing lesions within the liver. There is diffuse fatty infiltration of the liver. The gallbladder, pancreas, spleen, adrenal glands, kidneys are normal. The stomach, duodenum, small bowel cecum are unchanged. There is a small rounded lesion  adjacent to the terminal ileum measuring 13 x 14 mm (image 151, series 4) which is unchanged from 15 x 12 mm on prior. Colon and rectosigmoid colon are normal. Peritoneal disease appears stable. Peritoneal implants along the lateral margin of the right hepatic lobe measuring 7 mm unchanged from 8 mm on prior (image 101, series 4). Several smaller implants are noted along the inferior margin of the right hepatic lobe (image 112 and 113). Just deep to the left rectus muscle there is 8 mm nodule (image 150) unchanged from prior. Mass lesion along the right aspect of the uterine body measures 3.4 x 4.4 cm unchanged from 4.5 x 4.1 cm on prior remeasured. Nodule along the posterior wall of the uterus measures 19 mm compared a 18 mm on prior. There is enhancing lesion in the left adnexal measuring 12 mm (image 167) which is also unchanged from prior. No new peritoneal lesions are identified. Abdominal aorta is normal caliber. The retroperitoneal enlarged necrotic lymph nodes seen on CT scan 06/30/2012 is no longer present.  In the pelvis, nodularity along the uterus described above. No new lesions are evident. No pelvic lymphadenopathy. No acute osseous abnormality. IMPRESSION: 1. No evidence of metastatic carcinoid disease progression. 2. No evidence metastasis in the thorax. 3. Stable enhancing lesions in the left hepatic lobe. 4. Stable peritoneal metastasis along the hepatic capsule, ventral peritoneal space, and bulky nodularity involving the right and left adnexa and uterus. 5. Stable small nodular lesion adjacent to the distal ileum.   ASSESSMENT: Vanessa Waller 57 y.o. female with a history of Carcinoid tumor of ileum - Plan: CBC with Differential, Comprehensive metabolic panel (Cmet) - [facility], Lactate dehydrogenase (LDH) - [facility], Chromogranin A, CT Abdomen Pelvis W Contrast  TRANSAMINASES, SERUM, ELEVATED - Plan: Ambulatory referral to Gastroenterology  Hepatic steatosis - Plan: Ambulatory referral to  Gastroenterology  Left breast mass   PLAN: 1. Carcinoid tumor of ileum.  --We reviewed her CT chest abdomen pelvis taken on 06/22/2013 extensively. It demonstrates stable disease with no evidence of metastatic progression or new metastasis. Therefore, we plan to continue the somatostatin LAR 30 mg IM injections every month. We will plan to see her back in 4 months at which time we'll check CBC, chemistries, chromogranin A level, and 5 HIAA 24 hour urine.  Addition we will restage with a CT  abdomen pelvis prior to the next visit.  If this CT remains stable we might consider scanning every 6 months with the next year.   2. History of Hypokalemia (likely secondary to diarrhea that is mild). -We counseled the patient to continue for oral potassium supplementation and to increase it to daily for the next 5 days. She was also provided a handout on hypokalemia on prior visits.   Her hypokalemia could in part be due to decreased by mouth potassium and/or diarrhea.   3. Fatty liver/elevated transaminases. -Her liver enzymes today are elevated. We will repeat comprehensive metabolic panel her next visit. We will refer back to GI (seen by Dr. Lina Sar in October 2010 and felt that the etiology was hepatic steatosis) for further recommendations.   4. Cancer screening. --Patient  will have her annual Pap scheduled.  He reports being up-to-date  Colonoscopy.  She will schedule a repeat mammogram for January 2014. .    All questions were answered. The patient knows to call the clinic with any problems, questions or concerns. We can certainly see the patient much sooner if necessary. She was also provided and after visit summary  I spent 15 minutes counseling the patient face to face. The total time spent in the appointment was 25 minutes.    Issa Kosmicki, MD 07/12/2013 9:03 AM

## 2013-07-12 NOTE — Telephone Encounter (Signed)
appts made and printed. Pt is aware that cs will call w/ appt for CT...td

## 2013-07-19 LAB — CHROMOGRANIN A: Chromogranin A: 6 ng/mL (ref 1.9–15.0)

## 2013-07-20 ENCOUNTER — Other Ambulatory Visit: Payer: Self-pay

## 2013-07-21 ENCOUNTER — Other Ambulatory Visit: Payer: Self-pay | Admitting: Internal Medicine

## 2013-07-21 ENCOUNTER — Telehealth: Payer: Self-pay

## 2013-07-21 DIAGNOSIS — E876 Hypokalemia: Secondary | ICD-10-CM

## 2013-07-21 NOTE — Telephone Encounter (Signed)
S/w pt about adding lab to Jan appt, lab before CT scan and to combine April's injection appt w/MD appt. Talked about diarrhea monitoring - if increased diarrhea to increase potassium to daily from BID. Talked about symptoms of low potassium, she stated she has handouts from [place] on this. All this was confirmed with Dr Juliann Mule beforehand.

## 2013-08-09 ENCOUNTER — Other Ambulatory Visit (HOSPITAL_BASED_OUTPATIENT_CLINIC_OR_DEPARTMENT_OTHER): Payer: 59

## 2013-08-09 ENCOUNTER — Ambulatory Visit (HOSPITAL_BASED_OUTPATIENT_CLINIC_OR_DEPARTMENT_OTHER): Payer: 59

## 2013-08-09 VITALS — BP 143/76 | HR 95 | Temp 98.3°F

## 2013-08-09 DIAGNOSIS — D3A012 Benign carcinoid tumor of the ileum: Secondary | ICD-10-CM

## 2013-08-09 DIAGNOSIS — E876 Hypokalemia: Secondary | ICD-10-CM

## 2013-08-09 DIAGNOSIS — E340 Carcinoid syndrome: Secondary | ICD-10-CM

## 2013-08-09 LAB — BASIC METABOLIC PANEL (CC13)
Anion Gap: 10 mEq/L (ref 3–11)
BUN: 7.9 mg/dL (ref 7.0–26.0)
CO2: 28 meq/L (ref 22–29)
Calcium: 9.6 mg/dL (ref 8.4–10.4)
Chloride: 103 mEq/L (ref 98–109)
Creatinine: 1 mg/dL (ref 0.6–1.1)
Glucose: 177 mg/dl — ABNORMAL HIGH (ref 70–140)
Potassium: 3.5 mEq/L (ref 3.5–5.1)
SODIUM: 141 meq/L (ref 136–145)

## 2013-08-09 MED ORDER — OCTREOTIDE ACETATE 30 MG IM KIT
30.0000 mg | PACK | Freq: Once | INTRAMUSCULAR | Status: AC
  Administered 2013-08-09: 30 mg via INTRAMUSCULAR
  Filled 2013-08-09: qty 1

## 2013-08-10 ENCOUNTER — Telehealth: Payer: Self-pay | Admitting: Medical Oncology

## 2013-08-10 NOTE — Telephone Encounter (Signed)
Pt called for results of her BMET.

## 2013-08-12 ENCOUNTER — Ambulatory Visit: Payer: 59

## 2013-09-06 ENCOUNTER — Ambulatory Visit (HOSPITAL_BASED_OUTPATIENT_CLINIC_OR_DEPARTMENT_OTHER): Payer: 59

## 2013-09-06 VITALS — BP 135/77 | HR 88 | Temp 98.0°F

## 2013-09-06 DIAGNOSIS — D3A012 Benign carcinoid tumor of the ileum: Secondary | ICD-10-CM

## 2013-09-06 DIAGNOSIS — E340 Carcinoid syndrome: Secondary | ICD-10-CM

## 2013-09-06 MED ORDER — OCTREOTIDE ACETATE 30 MG IM KIT
30.0000 mg | PACK | Freq: Once | INTRAMUSCULAR | Status: AC
  Administered 2013-09-06: 30 mg via INTRAMUSCULAR
  Filled 2013-09-06: qty 1

## 2013-09-09 ENCOUNTER — Ambulatory Visit: Payer: 59

## 2013-10-04 ENCOUNTER — Telehealth: Payer: Self-pay | Admitting: Internal Medicine

## 2013-10-04 ENCOUNTER — Ambulatory Visit (HOSPITAL_BASED_OUTPATIENT_CLINIC_OR_DEPARTMENT_OTHER): Payer: 59

## 2013-10-04 VITALS — BP 138/78 | HR 99 | Temp 98.2°F

## 2013-10-04 DIAGNOSIS — D3A012 Benign carcinoid tumor of the ileum: Secondary | ICD-10-CM

## 2013-10-04 DIAGNOSIS — E340 Carcinoid syndrome: Secondary | ICD-10-CM

## 2013-10-04 MED ORDER — OCTREOTIDE ACETATE 30 MG IM KIT
30.0000 mg | PACK | Freq: Once | INTRAMUSCULAR | Status: AC
  Administered 2013-10-04: 30 mg via INTRAMUSCULAR
  Filled 2013-10-04: qty 1

## 2013-10-04 NOTE — Telephone Encounter (Signed)
pt came in and need to cx 4.29.15 appt and printed appt sched for april

## 2013-10-07 ENCOUNTER — Ambulatory Visit: Payer: 59

## 2013-10-14 ENCOUNTER — Telehealth: Payer: Self-pay | Admitting: Internal Medicine

## 2013-10-14 NOTE — Telephone Encounter (Signed)
pt called to r/s lab to nxt week...done...pt aware of new d.t

## 2013-10-20 ENCOUNTER — Other Ambulatory Visit: Payer: Self-pay

## 2013-10-21 ENCOUNTER — Other Ambulatory Visit (HOSPITAL_BASED_OUTPATIENT_CLINIC_OR_DEPARTMENT_OTHER): Payer: 59

## 2013-10-21 DIAGNOSIS — D3A012 Benign carcinoid tumor of the ileum: Secondary | ICD-10-CM

## 2013-10-21 LAB — COMPREHENSIVE METABOLIC PANEL (CC13)
ALK PHOS: 88 U/L (ref 40–150)
ALT: 111 U/L — ABNORMAL HIGH (ref 0–55)
AST: 97 U/L — AB (ref 5–34)
Albumin: 4.4 g/dL (ref 3.5–5.0)
Anion Gap: 9 mEq/L (ref 3–11)
BUN: 9.7 mg/dL (ref 7.0–26.0)
CALCIUM: 9.9 mg/dL (ref 8.4–10.4)
CO2: 30 mEq/L — ABNORMAL HIGH (ref 22–29)
CREATININE: 0.9 mg/dL (ref 0.6–1.1)
Chloride: 104 mEq/L (ref 98–109)
Glucose: 90 mg/dl (ref 70–140)
Potassium: 3.6 mEq/L (ref 3.5–5.1)
Sodium: 144 mEq/L (ref 136–145)
Total Bilirubin: 0.71 mg/dL (ref 0.20–1.20)
Total Protein: 8.1 g/dL (ref 6.4–8.3)

## 2013-10-21 LAB — CBC WITH DIFFERENTIAL/PLATELET
BASO%: 1 % (ref 0.0–2.0)
Basophils Absolute: 0.1 10*3/uL (ref 0.0–0.1)
EOS%: 0.9 % (ref 0.0–7.0)
Eosinophils Absolute: 0.1 10*3/uL (ref 0.0–0.5)
HCT: 45.7 % (ref 34.8–46.6)
HGB: 15.1 g/dL (ref 11.6–15.9)
LYMPH%: 36.4 % (ref 14.0–49.7)
MCH: 30.5 pg (ref 25.1–34.0)
MCHC: 33 g/dL (ref 31.5–36.0)
MCV: 92.4 fL (ref 79.5–101.0)
MONO#: 0.3 10*3/uL (ref 0.1–0.9)
MONO%: 6.5 % (ref 0.0–14.0)
NEUT#: 2.9 10*3/uL (ref 1.5–6.5)
NEUT%: 55.2 % (ref 38.4–76.8)
PLATELETS: 247 10*3/uL (ref 145–400)
RBC: 4.95 10*6/uL (ref 3.70–5.45)
RDW: 12.7 % (ref 11.2–14.5)
WBC: 5.3 10*3/uL (ref 3.9–10.3)
lymph#: 1.9 10*3/uL (ref 0.9–3.3)

## 2013-10-21 LAB — LACTATE DEHYDROGENASE (CC13): LDH: 236 U/L (ref 125–245)

## 2013-10-25 ENCOUNTER — Ambulatory Visit (HOSPITAL_COMMUNITY): Payer: 59

## 2013-10-25 ENCOUNTER — Ambulatory Visit (HOSPITAL_COMMUNITY)
Admission: RE | Admit: 2013-10-25 | Discharge: 2013-10-25 | Disposition: A | Discharge status: Home / Self Care | Payer: 59 | Source: Ambulatory Visit | Attending: Internal Medicine | Admitting: Internal Medicine

## 2013-10-25 ENCOUNTER — Other Ambulatory Visit: Payer: 59

## 2013-10-25 ENCOUNTER — Encounter (HOSPITAL_COMMUNITY): Payer: Self-pay

## 2013-10-25 DIAGNOSIS — C7B8 Other secondary neuroendocrine tumors: Secondary | ICD-10-CM | POA: Insufficient documentation

## 2013-10-25 DIAGNOSIS — C787 Secondary malignant neoplasm of liver and intrahepatic bile duct: Secondary | ICD-10-CM | POA: Insufficient documentation

## 2013-10-25 DIAGNOSIS — D3A012 Benign carcinoid tumor of the ileum: Secondary | ICD-10-CM

## 2013-10-25 DIAGNOSIS — K7689 Other specified diseases of liver: Secondary | ICD-10-CM | POA: Insufficient documentation

## 2013-10-25 DIAGNOSIS — C7A012 Malignant carcinoid tumor of the ileum: Secondary | ICD-10-CM | POA: Insufficient documentation

## 2013-10-25 MED ORDER — IOHEXOL 300 MG/ML  SOLN
100.0000 mL | Freq: Once | INTRAMUSCULAR | Status: AC | PRN
  Administered 2013-10-25: 100 mL via INTRAVENOUS

## 2013-10-26 LAB — CHROMOGRANIN A: CHROMOGRANIN A: 3.6 ng/mL (ref 1.9–15.0)

## 2013-11-01 ENCOUNTER — Ambulatory Visit (HOSPITAL_BASED_OUTPATIENT_CLINIC_OR_DEPARTMENT_OTHER): Payer: 59 | Admitting: Internal Medicine

## 2013-11-01 ENCOUNTER — Ambulatory Visit: Payer: 59

## 2013-11-01 ENCOUNTER — Telehealth: Payer: Self-pay | Admitting: Internal Medicine

## 2013-11-01 ENCOUNTER — Ambulatory Visit (HOSPITAL_BASED_OUTPATIENT_CLINIC_OR_DEPARTMENT_OTHER): Payer: 59

## 2013-11-01 VITALS — BP 138/76 | HR 80 | Temp 97.6°F

## 2013-11-01 VITALS — BP 144/83 | HR 81 | Temp 98.2°F | Resp 18 | Ht 62.0 in | Wt 134.1 lb

## 2013-11-01 DIAGNOSIS — D3A012 Benign carcinoid tumor of the ileum: Secondary | ICD-10-CM

## 2013-11-01 DIAGNOSIS — R740 Nonspecific elevation of levels of transaminase and lactic acid dehydrogenase [LDH]: Secondary | ICD-10-CM

## 2013-11-01 DIAGNOSIS — K760 Fatty (change of) liver, not elsewhere classified: Secondary | ICD-10-CM

## 2013-11-01 DIAGNOSIS — E876 Hypokalemia: Secondary | ICD-10-CM

## 2013-11-01 DIAGNOSIS — R7401 Elevation of levels of liver transaminase levels: Secondary | ICD-10-CM

## 2013-11-01 DIAGNOSIS — N6320 Unspecified lump in the left breast, unspecified quadrant: Secondary | ICD-10-CM

## 2013-11-01 DIAGNOSIS — E340 Carcinoid syndrome: Secondary | ICD-10-CM

## 2013-11-01 MED ORDER — OCTREOTIDE ACETATE 30 MG IM KIT
30.0000 mg | PACK | Freq: Once | INTRAMUSCULAR | Status: AC
  Administered 2013-11-01: 30 mg via INTRAMUSCULAR
  Filled 2013-11-01: qty 1

## 2013-11-01 MED ORDER — POTASSIUM CHLORIDE CRYS ER 20 MEQ PO TBCR: 20.0000 meq | EXTENDED_RELEASE_TABLET | ORAL | Status: DC

## 2013-11-01 NOTE — Patient Instructions (Signed)

## 2013-11-01 NOTE — Telephone Encounter (Signed)
Gave pt appt for lab, injection a dn MD up to September 2015, also gave pt oral contrast for CT

## 2013-11-01 NOTE — Progress Notes (Signed)
[place] OFFICE PROGRESS NOTE  Churchill, PA-C [address] [place] [address]  DIAGNOSIS: Carcinoid tumor of ileum - Plan: CBC with Differential, Comprehensive metabolic panel (Cmet) - [facility], Lactate dehydrogenase (LDH) - [facility], 5 HIAA, quantitative, urine, 24 hour, Chromogranin A, CT Abdomen Pelvis W Wo Contrast  Left breast mass  TRANSAMINASES, SERUM, ELEVATED - Plan: Comprehensive metabolic panel (Cmet) - [facility]  Hepatic steatosis  Hypokalemia - Plan: potassium chloride SA (K-DUR,KLOR-CON) 20 MEQ tablet  Chief Complaint  Patient presents with  . Follow-up    CURRENT THERAPY:Somatostatin LAR 30 mg IM was initiated on 07/07/2012 and is being administered monthly.  INTERVAL HISTORY: Vanessa Waller 58 y.o. female with an history of carcinoid tumor of ileum presents for followup. She was last seen at me on 07/12/2013. She denies any recent hospitalizations or emergency room visits.  She states that she feell well overall.  Pap smear screening exam will be done this fall.  She will schedule her Mammogram in May. She denies any abdominal pain.  She reports that her diarrhea is based on her dietary content, provoked by greasy food. This is controlled with immodium as needed. She has been receiving somatostatin LAR 30 mg IM every month. She continues to tolerate these shots without any problems. Her appetite and energy remains good.  She had a CT of her chest abdomen and pelvis on 10/25/2013.   MEDICAL HISTORY: Past Medical History  Diagnosis Date  . 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 Cancer   . Nonspecific elevation of levels of transaminase or lactic acid dehydrogenase (LDH)   . Hypopotassemia   . Diverticulosis   . Hepatic steatosis 02/24/12  . Uterine fibroid   . Nephrolithiasis   . Small bowel obstruction   . 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     liver,mesentery,pelvis    INTERIM HISTORY: has TRANSAMINASES, SERUM,  ELEVATED; Carcinoid tumor of ileum; Hepatic steatosis; Left breast mass; Diarrhea; Hypokalemia; and 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 on her problem list.    ALLERGIES:  is allergic to amoxicillin and cephalexin.  MEDICATIONS: has a current medication list which includes the following prescription(s): ascorbic acid, calcium carbonate-vitamin d, vitamin d-3, loperamide, multivitamin, octreotide, and potassium chloride sa.  SURGICAL HISTORY:  Past Surgical History  Procedure Laterality Date  . Tubal ligation    . Carcinoid tumor resection      ileum   PROBLEM LIST:  1. 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 The patient underwent surgical resection of her carcinoid tumor on 08/17/2005. Two out of 13 lymph nodes were positive. The patient's stage was III. The patient was noted to have an elevated urinary 5HIAA level in July 2011. A positive octreotide scan was noted coming from the right lower pelvis adjacent to the bladder on 09/06/2010. Initially this was felt to be a uterine fibroid. The patient has continued to have positive octreotide scan on 11/07/2011 and 05/06/2012. CT scans of the abdomen and pelvis were also carried out on 03/02/2010, 02/24/2012 and 06/30/2012. These scans in conjunction with an elevated 5HIAA level of 22.7 on 04/13/2012 and a chromogranin A level which had risen to 18.0 on 03/23/2012 strongly suggested that the patient's disease had recurred. The patient required admission to the hospital from 06/30/2012 through 07/03/2012 for an episode of upper abdominal pain which occurred suddenly the day prior to admission. The patient was started on somatostatin LAR 30 mg IM on  07/07/2012 for recurrent disease involving the lateral segment of the left hepatic lobe, a right jejunal mesenteric node and 2 enhancing masses adjacent to the uterus. Twenty-four-hour urine 5HIAA on 10/19/2012 was 5.5. CT scan of the abdomen and pelvis  with IV contrast on 10/19/2012 showed generally stable disease with the exception of a decrease in the right abdominal mesenteric lymphadenopathy. There have been 2 lesions in the liver which are felt to be stable. One lesion is in the lateral segment of the left hepatic lobe measuring 14 x 14 mm. The second lesion is seen in the lateral segment adjacent to the falciform ligament and measures 9 mm.  2. Uterine fibroids noted on imaging studies dating back to late April 2010.  3. Elevated liver transaminases noted in March 2007. The patient was evaluated by Dr. Delfin Edis in October 2010 and felt that the etiology was hepatic steatosis.  4. Admission to the hospital for upper abdominal pain with sudden onset. Admission dates were 06/30/2012 through 07/03/2012. CT scan of the abdomen and pelvis with IV contrast on 06/30/2012 showed no definite etiology for the pain. It was noted that a lymph node in the right jejunal mesentery had developed central necrosis when compared with the prior study of 02/24/2012. There was no evidence for bowel obstruction. There has previously been no evidence for gallstones on recent CT scans, and an abdominal ultrasound dating back to 05/08/2009.  5. Possible mass involving the left breast on screening mammogram from 06/18/2012. A diagnostic mammogram and ultrasound of the left breast carried out on 07/17/2012 were normal. Followup screening mammogram was suggested in 1 year.  6. Diarrhea with onset in December 2013, somewhat improved with Imodium.    REVIEW OF SYSTEMS:   Constitutional: Denies fevers, chills or abnormal weight loss Eyes: Denies blurriness of vision Ears, nose, mouth, throat, and face: Denies mucositis or sore throat Respiratory: Denies cough, dyspnea or wheezes Cardiovascular: Denies palpitation, chest discomfort or lower extremity swelling Gastrointestinal:  Denies nausea, heartburn or positive for diarrhea Skin: Denies abnormal skin rashes Lymphatics:  Denies new lymphadenopathy or easy bruising Neurological:Denies numbness, tingling or new weaknesses Behavioral/Psych: Mood is stable, no new changes  All other systems were reviewed with the patient and are negative.  PHYSICAL EXAMINATION: ECOG PERFORMANCE STATUS: 0 - Asymptomatic  Blood pressure 144/83, pulse 81, temperature 98.2 F (36.8 C), temperature source Oral, resp. rate 18, height 5\' 2"  (1.575 m), weight 134 lb 1.6 oz (60.827 kg).  GENERAL:alert, no distress and comfortable, female appears her stated age SKIN: skin color, texture, turgor are normal, no rashes or significant lesions EYES: normal, Conjunctiva are pink and non-injected, sclera clear OROPHARYNX:no exudate, no erythema and lips, buccal mucosa, and tongue normal  NECK: supple, thyroid normal size, non-tender, without nodularity LYMPH:  no palpable lymphadenopathy in the cervical, axillary or supraclavicular LUNGS: clear to auscultation and percussion with normal breathing effort HEART: regular rate & rhythm and no murmurs and no lower extremity edema ABDOMEN:abdomen soft, non-tender and normal bowel sounds; midline abdominal scar well-healed.  Musculoskeletal:no cyanosis of digits and no clubbing  NEURO: alert & oriented x 3 with fluent speech, no focal motor/sensory deficits   LABORATORY DATA: No results found for this or any previous visit (from the past 48 hour(s)).     Labs:  Lab Results  Component Value Date   WBC 5.3 10/21/2013   HGB 15.1 10/21/2013   HCT 45.7 10/21/2013   MCV 92.4 10/21/2013   PLT 247 10/21/2013   NEUTROABS 2.9  10/21/2013      Chemistry      Component Value Date/Time   NA 144 10/21/2013 0802   NA 135 07/01/2012 0357   NA 137 10/01/2010 0000   K 3.6 10/21/2013 0802   K 4.0 07/01/2012 0357   K 4.1 10/01/2010 0000   CL 102 12/28/2012 0824   CL 99 07/01/2012 0357   CL 100 10/01/2010 0000   CO2 30* 10/21/2013 0802   CO2 25 07/01/2012 0357   CO2 28 10/01/2010 0000   BUN 9.7 10/21/2013 0802   BUN  10 07/01/2012 0357   BUN 13 10/01/2010 0000   CREATININE 0.9 10/21/2013 0802   CREATININE 0.82 07/01/2012 0357   CREATININE 0.83 10/07/2011 0817   CREATININE 0.9 10/01/2010 0000      Component Value Date/Time   CALCIUM 9.9 10/21/2013 0802   CALCIUM 9.0 07/01/2012 0357   CALCIUM 9.3 10/01/2010 0000   ALKPHOS 88 10/21/2013 0802   ALKPHOS 54 07/01/2012 0357   ALKPHOS 64 10/01/2010 0000   AST 97* 10/21/2013 0802   AST 31 07/01/2012 0357   AST 80* 10/01/2010 0000   ALT 111* 10/21/2013 0802   ALT 33 07/01/2012 0357   ALT 90* 10/01/2010 0000   BILITOT 0.71 10/21/2013 0802   BILITOT 0.7 07/01/2012 0357   BILITOT 0.50 10/01/2010 0000       CBC: No results found for this basename: WBC, NEUTROABS, HGB, HCT, MCV, PLT,  in the last 168 hours   RADIOGRAPHIC STUDIES: Ct Abdomen Pelvis W Wo Contrast  03/29/2013   *RADIOLOGY REPORT*  Clinical Data:  Followup metastatic carcinoid  CT CHEST, ABDOMEN AND PELVIS WITH CONTRAST  Technique:  Multidetector CT imaging of the chest, abdomen and pelvis was performed following the standard protocol during bolus administration of intravenous contrast.  Contrast: 177mL OMNIPAQUE IOHEXOL 300 MG/ML  SOLN  Comparison:  10/19/2012  CT CHEST  Findings:  There is no pleural effusion identified.  The no airspace consolidation noted.  No pulmonary parenchymal nodule or mass noted.  Heart size is normal.  No pericardial or pleural effusion identified.  No enlarged axillary or supraclavicular adenopathy.  Review of the visualized bony structures is negative for lytic or sclerotic bone lesion.  IMPRESSION:  1. No evidence for metastatic disease.  CT ABDOMEN AND PELVIS  Findings:  There is diffuse fatty infiltration of the liver.  There is a focal fatty sparing are identified adjacent to the gallbladder fossa.  Hypervascular liver lesion in the lateral segment of left hepatic lobe measures 1.4 cm, image 20/series 3.  This is unchanged from previous exam.  The subcapsular lesion overlying the  inferior right hepatic lobe measures 0.9 cm, image 36/series 3.  This is also unchanged from previous exam.  The left hepatic lobe lesion adjacent to the falciform ligament measures 1.2 cm, image 29/series 3.  This is also unchanged.  No new liver lesions identified.  The gallbladder is normal.  No biliary dilatation.  Normal appearance of the pancreas.  The spleen is normal.  Normal appearance of both adrenal glands.  The right kidney is normal.  The left kidney is also normal.  Urinary bladder appears within normal limits.  Fibroid uterus is again identified. The heterogeneously enhancing mass in the right adnexa is stable measuring 4.7 cm, image 164/series 5.  The left adnexal lesion measures 4.2 cm, image 169/series 5.  Previously 4.1 cm.  The small hypervascular lesion within the cul-de-sac measures 1.9 cm, image 171/series 5.  This is stable  from previous exam.  Small enhancing nodule along the undersurface of the left rectus muscle measures 0.8 cm, image 158/series 5.  This is stable from previous exam.  In the right iliac fossa there is a nodule which measures 1.7 cm, image 156/series 5.  This is unchanged from previous exam.  No new or enlarging peritoneal lesions identified.  The stomach appears normal.  The small bowel loops have a normal course and caliber.  There is no obstruction.  Review of the visualized osseous structures is sig significant for mild lumbar spondylosis.  No aggressive lytic or sclerotic bone lesions identified.  IMPRESSION:  1.   No acute findings within the abdomen or pelvis. 2.   Stable peritoneal disease. 3.   Hypervascular liver lesions are unchanged from previous exam.   Original Report Authenticated By: Kerby Moors, M.D.   Ct Chest Abdomen and pelvis W Contrast  06/21/2013  CT CHEST FINDINGS Small axillary lymph nodes are similar to prior. No supraclavicular  lymph nodes. No mediastinal or hilar lymph nodes. No pericardial fluid. Review of the lung parenchyma  demonstrates no suspicious pulmonary nodules. Airways are normal. CT ABDOMEN AND PELVIS FINDINGS There is again demonstrated a hypervascular lesions in the left hepatic lobe. Lesion in the left lateral hepatic lobe measures 14 mm unchanged from 14 mm on prior (image 23, series 2). Small lesion  along the falciform ligament measures 8 mm (image 31, series 2) unchanged from 8 mm on prior. Increased vascularity along the gallbladder fossa (image 30, 35 and 41 for example) is unchanged.  There are no new enhancing lesions within the liver. There is diffuse fatty infiltration of the liver. The gallbladder, pancreas, spleen, adrenal glands, kidneys are normal. The stomach, duodenum, small bowel cecum are unchanged. There is a small rounded lesion adjacent to the terminal ileum measuring 13 x 14 mm (image 151, series 4) which is unchanged from 15 x 12 mm on prior. Colon and rectosigmoid colon are normal. Peritoneal disease appears stable. Peritoneal implants along the lateral margin of the right hepatic lobe measuring 7 mm unchanged from 8 mm on prior (image 101, series 4). Several smaller implants are noted along the inferior margin of the right hepatic lobe (image 112 and 113). Just deep to the left rectus muscle there is 8 mm nodule (image 150) unchanged from prior. Mass lesion along the right aspect of the uterine body measures 3.4 x 4.4 cm unchanged from 4.5 x 4.1 cm on prior remeasured. Nodule along the posterior wall of the uterus measures 19 mm compared a 18 mm on prior. There is enhancing lesion in the left adnexal measuring 12 mm (image 167) which is also unchanged from prior. No new peritoneal lesions are identified. Abdominal aorta is normal caliber. The retroperitoneal enlarged necrotic lymph nodes seen on CT scan 06/30/2012 is no longer present.  In the pelvis, nodularity along the uterus described above. No new lesions are evident. No pelvic lymphadenopathy. No acute osseous abnormality. IMPRESSION: 1. No  evidence of metastatic carcinoid disease progression. 2. No evidence metastasis in the thorax. 3. Stable enhancing lesions in the left hepatic lobe. 4. Stable peritoneal metastasis along the hepatic capsule, ventral peritoneal space, and bulky nodularity involving the right and left adnexa and uterus. 5. Stable small nodular lesion adjacent to the distal ileum.   CT of abdomen and pelvis 10/25/2013 CT ABDOMEN AND PELVIS WITH CONTRAST TECHNIQUE: Multidetector CT imaging of the abdomen and pelvis was performed using the standard protocol following bolus administration of  intravenous contrast. Imaging includes arterial phase, portal venous phase and delayed images. Sagittal and coronal MPR images reconstructed from axial data sets. CONTRAST: 148mL OMNIPAQUE IOHEXOL 300 MG/ML SOLN IV. Dilute oral contrast. COMPARISON: 06/21/2013 FINDINGS:  Lung bases clear. Diffuse fatty infiltration of liver. Multiple hypervascular hepatic nodules compatible with hepatic metastases again identified. 13 x 13 mm lesion lateral segment left lobe liver image 25  previously 14 x 12 mm. 6 x 6 mm right lobe liver lesion image 50, better defined, previously 5 x 5 mm.  Anterior lesion lateral segment left lobe 10 x 7 mm image 34 previously 9 x 6 mm. Additional areas of increased attenuation within liver adjacent to gallbladder fossa and porta hepatis may represent focal sparing though additional hepatic lesions are difficult to exclude. Multiple serosal nodules at liver compatible with peritoneal metastases, largest 10 mm image 39. Additional tiny peritoneal based nodules are seen anteriorly in the abdomen and pelvis. Large right adnexal metastasis, 4.4 x 4.2 cm image 100 series for previously 4.4 x 4.0 cm, contiguous with the right lateral aspect of the uterus with question invasion. Additional tumor nodularity in left adnexa, grossly unchanged. Spleen, pancreas, kidneys, and adrenal glands normal appearance. Normal sized periportal and  mesenteric lymph nodes without adenopathy. No free-fluid, free air focal inflammatory process. Bladder and ureters unremarkable. No acute osseous findings. IMPRESSION: Hepatic and peritoneal metastatic disease demonstrating little interval change since previous exam as above. No definite new intra-abdominal or intrapelvic abnormalities.   Results for CHANTEL, NORR (MRN [id number]) as of 11/01/2013 09:03  Ref. Range 09/23/2011 15:26 03/23/2012 08:36 07/07/2012 10:27 08/04/2012 13:58 01/25/2013 11:12 04/19/2013 08:07 07/12/2013 08:00 10/21/2013 08:02  Chromogranin A Latest Range: 1.9-15.0 ng/mL 7.8 18.0 (H) 4.0 3.8 6.0 5.2 6.0 3.6   Results for LAILONIE, MUMPOWER (MRN [id number]) as of 11/01/2013 09:03  Ref. Range 07/13/2012 09:50 10/19/2012 10:32 06/21/2013 08:46  5-HIAA, 24 Hr Urine Latest Range: <=6.0 mg/24 h 7.6 (H) 5.5 4.5   ASSESSMENT: Vanessa Waller 58 y.o. female with a history of Carcinoid tumor of ileum - Plan: CBC with Differential, Comprehensive metabolic panel (Cmet) - [facility], Lactate dehydrogenase (LDH) - [facility], 5 HIAA, quantitative, urine, 24 hour, Chromogranin A, CT Abdomen Pelvis W Wo Contrast  Left breast mass  TRANSAMINASES, SERUM, ELEVATED - Plan: Comprehensive metabolic panel (Cmet) - [facility]  Hepatic steatosis  Hypokalemia - Plan: potassium chloride SA (K-DUR,KLOR-CON) 20 MEQ tablet   PLAN: 1. Carcinoid tumor of ileum.  --We reviewed her CT chest abdomen pelvis taken on 10/25/2013 extensively. It demonstrates stable disease with no evidence of metastatic progression or new metastasis. Therefore, we plan to continue the somatostatin LAR 30 mg IM injections every month. We will plan to see her back in 4 months at which time we'll check CBC, chemistries, chromogranin A level, and 5 HIAA 24 hour urine.  Addition we will restage with a CT  abdomen pelvis prior to the next visit.  If this CT remains stable so we will scan every 6 months with the next year.   2. History of Hypokalemia (likely  secondary to diarrhea that is mild). -Her potassium is 3.6 today. We counseled the patient to continue for oral potassium supplementation.  3. Fatty liver/elevated transaminases. -Her liver enzymes today are elevated. We will repeat comprehensive metabolic panel her next visit. We will refer back to GI (seen by Dr. Delfin Edis in October 2010 and felt that the etiology was hepatic steatosis) for further recommendations. He last visit was in the  Spring of 2014.    4. Cancer screening. --Patient will have her annual Pap scheduled in May.  He reports being up-to-date colonoscopy.  She will schedule a repeat mammogram for this year in May .    All questions were answered. The patient knows to call the clinic with any problems, questions or concerns. We can certainly see the patient much sooner if necessary. She was also provided and after visit summary  I spent 15 minutes counseling the patient face to face. The total time spent in the appointment was 25 minutes.    Concha Norway, MD 11/01/2013 9:18 AM

## 2013-11-08 ENCOUNTER — Ambulatory Visit (HOSPITAL_COMMUNITY): Payer: 59

## 2013-11-08 ENCOUNTER — Ambulatory Visit: Payer: 59

## 2013-11-08 ENCOUNTER — Other Ambulatory Visit: Payer: 59

## 2013-11-10 ENCOUNTER — Ambulatory Visit: Payer: 59

## 2013-11-11 ENCOUNTER — Ambulatory Visit: Payer: 59

## 2013-11-29 ENCOUNTER — Ambulatory Visit (HOSPITAL_BASED_OUTPATIENT_CLINIC_OR_DEPARTMENT_OTHER): Payer: 59

## 2013-11-29 ENCOUNTER — Ambulatory Visit: Payer: 59

## 2013-11-29 VITALS — BP 136/75 | HR 86 | Temp 98.2°F

## 2013-11-29 DIAGNOSIS — D3A012 Benign carcinoid tumor of the ileum: Secondary | ICD-10-CM

## 2013-11-29 DIAGNOSIS — E340 Carcinoid syndrome: Secondary | ICD-10-CM

## 2013-11-29 MED ORDER — OCTREOTIDE ACETATE 30 MG IM KIT
30.0000 mg | PACK | Freq: Once | INTRAMUSCULAR | Status: AC
  Administered 2013-11-29: 30 mg via INTRAMUSCULAR
  Filled 2013-11-29: qty 1

## 2013-12-27 ENCOUNTER — Ambulatory Visit (HOSPITAL_BASED_OUTPATIENT_CLINIC_OR_DEPARTMENT_OTHER): Payer: 59

## 2013-12-27 VITALS — BP 131/72 | HR 84 | Temp 98.3°F

## 2013-12-27 DIAGNOSIS — E3400 Carcinoid syndrome, unspecified: Secondary | ICD-10-CM

## 2013-12-27 DIAGNOSIS — D3A012 Benign carcinoid tumor of the ileum: Secondary | ICD-10-CM

## 2013-12-27 MED ORDER — OCTREOTIDE ACETATE 30 MG IM KIT
30.0000 mg | PACK | Freq: Once | INTRAMUSCULAR | Status: AC
  Administered 2013-12-27: 30 mg via INTRAMUSCULAR
  Filled 2013-12-27: qty 1

## 2014-01-18 ENCOUNTER — Other Ambulatory Visit (HOSPITAL_COMMUNITY): Payer: Self-pay | Admitting: Physician Assistant

## 2014-01-18 DIAGNOSIS — Z1231 Encounter for screening mammogram for malignant neoplasm of breast: Secondary | ICD-10-CM

## 2014-01-24 ENCOUNTER — Ambulatory Visit (HOSPITAL_BASED_OUTPATIENT_CLINIC_OR_DEPARTMENT_OTHER): Payer: 59

## 2014-01-24 VITALS — BP 122/80 | HR 84 | Temp 98.3°F

## 2014-01-24 DIAGNOSIS — E340 Carcinoid syndrome: Secondary | ICD-10-CM

## 2014-01-24 DIAGNOSIS — D3A012 Benign carcinoid tumor of the ileum: Secondary | ICD-10-CM

## 2014-01-24 MED ORDER — OCTREOTIDE ACETATE 30 MG IM KIT
30.0000 mg | PACK | Freq: Once | INTRAMUSCULAR | Status: AC
  Administered 2014-01-24: 30 mg via INTRAMUSCULAR
  Filled 2014-01-24: qty 1

## 2014-01-24 NOTE — Patient Instructions (Signed)

## 2014-01-28 ENCOUNTER — Ambulatory Visit (HOSPITAL_BASED_OUTPATIENT_CLINIC_OR_DEPARTMENT_OTHER): Payer: 59 | Admitting: Nurse Practitioner

## 2014-01-28 ENCOUNTER — Telehealth: Payer: Self-pay

## 2014-01-28 ENCOUNTER — Telehealth: Payer: Self-pay | Admitting: Internal Medicine

## 2014-01-28 VITALS — BP 136/81 | HR 79 | Temp 98.3°F | Resp 19 | Ht 62.0 in | Wt 135.4 lb

## 2014-01-28 DIAGNOSIS — T7840XA Allergy, unspecified, initial encounter: Secondary | ICD-10-CM

## 2014-01-28 DIAGNOSIS — D3A012 Benign carcinoid tumor of the ileum: Secondary | ICD-10-CM

## 2014-01-28 DIAGNOSIS — I10 Essential (primary) hypertension: Secondary | ICD-10-CM

## 2014-01-28 DIAGNOSIS — IMO0001 Reserved for inherently not codable concepts without codable children: Secondary | ICD-10-CM

## 2014-01-28 NOTE — Telephone Encounter (Signed)
Pt called and said her injection site from Sandostatin started hurting last night. It is feeling warmer and is painful with any pressure on the site eg. Her jeans. S/w Dr Juliann Mule and called pt back. We will see her at the symptom management clinic today at 4 pm. pof and call to scheduler.

## 2014-01-28 NOTE — Telephone Encounter (Signed)
added appt per Juliann Pulse...pt already aware

## 2014-01-31 ENCOUNTER — Encounter: Payer: Self-pay | Admitting: Nurse Practitioner

## 2014-01-31 DIAGNOSIS — T7840XA Allergy, unspecified, initial encounter: Secondary | ICD-10-CM | POA: Insufficient documentation

## 2014-01-31 NOTE — Assessment & Plan Note (Signed)
Patient's CT of the chest/abdomen/pelvis taken on 10/25/2013 demonstrated stable disease with no evidence of metastatic progression or new metastasis.  The patient will continue to receive somatostatin LAR 30 mg injections on an every month basis.  The patient has plans for a repeat mammogram on 02/01/2014.  She is scheduled for her next somatostatin injection on 02/21/2014.  She is scheduled for restaging CT with contrast of abdomen/pelvis on 03/28/2014.

## 2014-01-31 NOTE — Progress Notes (Signed)
[place]   Chief Complaint  Patient presents with  . Pain    HPI: Vanessa Waller 58 y.o. female diagnosed with carcinoid tumor of the ileum.  Patient has been receiving somatostatin L8AR 30 mg injections on an every month basis.  She last received her Somatostatin injection on 01/24/2014.  She called her she called the cancer Center today requesting urgent care visit.  She states that her last injection has been causing some discomfort at the injection site.  She denies any erythema, edema, rubs, or red streaks to the site.  Chills denies any fever or chills recently.  CURRENT THERAPY: Somatostatin LAR 30 mg intramuscularly every month.  Past Medical History  Diagnosis Date  . 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 Cancer   . Nonspecific elevation of levels of transaminase or lactic acid dehydrogenase (LDH)   . Hypopotassemia   . Diverticulosis   . Hepatic steatosis 02/24/12  . Uterine fibroid   . Nephrolithiasis   . Small bowel obstruction   . 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     liver,mesentery,pelvis    Past Surgical History  Procedure Laterality Date  . Tubal ligation    . Carcinoid tumor resection      ileum    has TRANSAMINASES, SERUM, ELEVATED; Carcinoid tumor of ileum; Hepatic steatosis; Left breast mass; Diarrhea; Hypokalemia; 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; and Hypersensitivity reaction on her problem list.     is allergic to amoxicillin and cephalexin.    Medication List       This list is accurate as of: 01/28/14 11:59 PM.  Always use your most recent med list.               ascorbic acid 500 MG tablet  Commonly known as:  VITAMIN C  Take 500 mg by mouth daily.     CALCIUM 600-D PO  Take 1 tablet by mouth daily.     loperamide 2 MG capsule  Commonly known as:  IMODIUM  Take by mouth as needed for diarrhea or loose stools.     multivitamin tablet  Take 1 tablet by mouth daily.     octreotide 30 MG injection  Commonly known as:  SANDOSTATIN LAR  Inject 30 mg into the muscle every 28 (twenty-eight) days.     potassium chloride SA 20 MEQ tablet  Commonly known as:  K-DUR,KLOR-CON  Take 1 tablet (20 mEq total) by mouth every other day.     Vitamin D-3 5000 UNITS Tabs  Take 1 tablet by mouth daily.          PHYSICAL EXAMINATION  Filed Vitals:   01/28/14 1600  BP: 136/81  Pulse: 79  Temp: 98.3 F (36.8 C)  Resp: 19    GENERAL:alert, healthy, no distress, well nourished and well developed SKIN: skin color, texture, turgor are normal, no rashes or significant lesions,right buttock injection site with very mild tenderness with palpation; but no erythema, no warmth, no edema, or red streaks. No obvious infection noted.    ASSESSMENT/PLAN:    Carcinoid tumor of ileum  Assessment & Plan Patient's CT of the chest/abdomen/pelvis taken on 10/25/2013 demonstrated stable disease with no evidence of metastatic progression or new metastasis.  The patient will continue to receive somatostatin LAR 30 mg injections on an every month basis.  The patient has plans for a repeat mammogram on 02/01/2014.  She is scheduled for her next somatostatin injection on 02/21/2014.  She  is scheduled for restaging CT with contrast of abdomen/pelvis on 03/28/2014.   Hypersensitivity reaction  Assessment & Plan Patient received her last somatostatin injection on 01/24/2014.  It does appear that she has developed a very mild reaction to this last injection.  She has some very mild tenderness at the injection site itself.  There is no erythema, no warmth, no edema, and no red streaks.  No evidence of active infection whatsoever.  Advised patient that she could try simple compresses to the site.  Also advised she could try some ibuprofen on a PRN basis.  Also advised patient to call ago directed to the emergency department over the weekend if she develops any changes or worsening of her  symptoms.     Patient stated understanding of all instructions; and was in agreement with this plan of care. The patient knows to call the clinic with any problems, questions or concerns.   Will review all aspects of patient's case with Dr. Juliann Mule.   Total time spent with patient was 15 minutes;  with greater than 75 percent of that time spent in face to face counseling regarding likely very mild sensitivity reaction to last Somatostatin injection, and coordination of care and follow up.  Disclaimer: This note was dictated with voice recognition software. Similar sounding words can inadvertently be transcribed and may not be corrected upon review.   Drue Second, NP 01/31/2014

## 2014-01-31 NOTE — Assessment & Plan Note (Signed)
Patient received her last somatostatin injection on 01/24/2014.  It does appear that she has developed a very mild reaction to this last injection.  She has some very mild tenderness at the injection site itself.  There is no erythema, no warmth, no edema, and no red streaks.  No evidence of active infection whatsoever.  Advised patient that she could try simple compresses to the site.  Also advised she could try some ibuprofen on a PRN basis.  Also advised patient to call ago directed to the emergency department over the weekend if she develops any changes or worsening of her symptoms.

## 2014-02-01 ENCOUNTER — Ambulatory Visit (HOSPITAL_COMMUNITY)
Admission: RE | Admit: 2014-02-01 | Discharge: 2014-02-01 | Disposition: A | Discharge status: Home / Self Care | Payer: 59 | Source: Ambulatory Visit | Attending: Physician Assistant | Admitting: Physician Assistant

## 2014-02-01 DIAGNOSIS — Z1231 Encounter for screening mammogram for malignant neoplasm of breast: Secondary | ICD-10-CM

## 2014-02-21 ENCOUNTER — Ambulatory Visit (HOSPITAL_BASED_OUTPATIENT_CLINIC_OR_DEPARTMENT_OTHER): Payer: 59

## 2014-02-21 VITALS — BP 130/76 | HR 85 | Temp 98.2°F

## 2014-02-21 DIAGNOSIS — E3400 Carcinoid syndrome, unspecified: Secondary | ICD-10-CM

## 2014-02-21 DIAGNOSIS — D3A012 Benign carcinoid tumor of the ileum: Secondary | ICD-10-CM

## 2014-02-21 MED ORDER — OCTREOTIDE ACETATE 30 MG IM KIT
30.0000 mg | PACK | Freq: Once | INTRAMUSCULAR | Status: AC
  Administered 2014-02-21: 30 mg via INTRAMUSCULAR
  Filled 2014-02-21: qty 1

## 2014-03-22 ENCOUNTER — Ambulatory Visit (HOSPITAL_BASED_OUTPATIENT_CLINIC_OR_DEPARTMENT_OTHER): Payer: 59

## 2014-03-22 ENCOUNTER — Other Ambulatory Visit (HOSPITAL_BASED_OUTPATIENT_CLINIC_OR_DEPARTMENT_OTHER): Payer: 59

## 2014-03-22 VITALS — BP 142/79 | HR 76 | Temp 98.8°F

## 2014-03-22 DIAGNOSIS — D3A012 Benign carcinoid tumor of the ileum: Secondary | ICD-10-CM

## 2014-03-22 DIAGNOSIS — R740 Nonspecific elevation of levels of transaminase and lactic acid dehydrogenase [LDH]: Secondary | ICD-10-CM

## 2014-03-22 DIAGNOSIS — R7402 Elevation of levels of lactic acid dehydrogenase (LDH): Secondary | ICD-10-CM

## 2014-03-22 DIAGNOSIS — R7401 Elevation of levels of liver transaminase levels: Secondary | ICD-10-CM

## 2014-03-22 DIAGNOSIS — E340 Carcinoid syndrome: Secondary | ICD-10-CM

## 2014-03-22 LAB — CBC WITH DIFFERENTIAL/PLATELET
BASO%: 1.1 % (ref 0.0–2.0)
Basophils Absolute: 0.1 10*3/uL (ref 0.0–0.1)
EOS%: 0.8 % (ref 0.0–7.0)
Eosinophils Absolute: 0 10*3/uL (ref 0.0–0.5)
HEMATOCRIT: 44.5 % (ref 34.8–46.6)
HGB: 14.5 g/dL (ref 11.6–15.9)
LYMPH#: 2 10*3/uL (ref 0.9–3.3)
LYMPH%: 36.8 % (ref 14.0–49.7)
MCH: 29.8 pg (ref 25.1–34.0)
MCHC: 32.5 g/dL (ref 31.5–36.0)
MCV: 91.5 fL (ref 79.5–101.0)
MONO#: 0.4 10*3/uL (ref 0.1–0.9)
MONO%: 7.8 % (ref 0.0–14.0)
NEUT#: 2.9 10*3/uL (ref 1.5–6.5)
NEUT%: 53.5 % (ref 38.4–76.8)
Platelets: 231 10*3/uL (ref 145–400)
RBC: 4.87 10*6/uL (ref 3.70–5.45)
RDW: 12.5 % (ref 11.2–14.5)
WBC: 5.4 10*3/uL (ref 3.9–10.3)

## 2014-03-22 LAB — COMPREHENSIVE METABOLIC PANEL (CC13)
ALT: 108 U/L — AB (ref 0–55)
ANION GAP: 9 meq/L (ref 3–11)
AST: 101 U/L — ABNORMAL HIGH (ref 5–34)
Albumin: 4.1 g/dL (ref 3.5–5.0)
Alkaline Phosphatase: 81 U/L (ref 40–150)
BUN: 10.4 mg/dL (ref 7.0–26.0)
CALCIUM: 9.4 mg/dL (ref 8.4–10.4)
CHLORIDE: 106 meq/L (ref 98–109)
CO2: 26 meq/L (ref 22–29)
CREATININE: 0.9 mg/dL (ref 0.6–1.1)
GLUCOSE: 95 mg/dL (ref 70–140)
Potassium: 3.6 mEq/L (ref 3.5–5.1)
Sodium: 142 mEq/L (ref 136–145)
Total Bilirubin: 0.71 mg/dL (ref 0.20–1.20)
Total Protein: 7.9 g/dL (ref 6.4–8.3)

## 2014-03-22 LAB — LACTATE DEHYDROGENASE (CC13): LDH: 258 U/L — ABNORMAL HIGH (ref 125–245)

## 2014-03-22 MED ORDER — OCTREOTIDE ACETATE 30 MG IM KIT
30.0000 mg | PACK | Freq: Once | INTRAMUSCULAR | Status: AC
  Administered 2014-03-22: 30 mg via INTRAMUSCULAR
  Filled 2014-03-22: qty 1

## 2014-03-25 LAB — CHROMOGRANIN A: CHROMOGRANIN A: 14 ng/mL (ref <=15)

## 2014-03-28 ENCOUNTER — Encounter (HOSPITAL_COMMUNITY): Payer: Self-pay

## 2014-03-28 ENCOUNTER — Ambulatory Visit (HOSPITAL_COMMUNITY)
Admission: RE | Admit: 2014-03-28 | Discharge: 2014-03-28 | Disposition: A | Discharge status: Home / Self Care | Payer: 59 | Source: Ambulatory Visit | Attending: Internal Medicine | Admitting: Internal Medicine

## 2014-03-28 ENCOUNTER — Ambulatory Visit (HOSPITAL_COMMUNITY): Payer: 59

## 2014-03-28 DIAGNOSIS — D3A012 Benign carcinoid tumor of the ileum: Secondary | ICD-10-CM | POA: Insufficient documentation

## 2014-03-28 MED ORDER — IOHEXOL 300 MG/ML  SOLN
100.0000 mL | Freq: Once | INTRAMUSCULAR | Status: AC | PRN
  Administered 2014-03-28: 100 mL via INTRAVENOUS

## 2014-04-01 LAB — 5 HIAA, QUANTITATIVE, URINE, 24 HOUR
5-HIAA, URINE: 3.7 mg/(24.h) (ref <=6.0)
Volume, Urine-5HIAA: 1700 mL/24 h

## 2014-04-04 ENCOUNTER — Telehealth: Payer: Self-pay | Admitting: Hematology

## 2014-04-04 ENCOUNTER — Encounter: Payer: Self-pay | Admitting: Hematology

## 2014-04-04 ENCOUNTER — Ambulatory Visit (HOSPITAL_BASED_OUTPATIENT_CLINIC_OR_DEPARTMENT_OTHER): Payer: 59 | Admitting: Hematology

## 2014-04-04 VITALS — BP 141/81 | HR 79 | Temp 98.5°F | Resp 18 | Ht 62.0 in | Wt 137.3 lb

## 2014-04-04 DIAGNOSIS — K7689 Other specified diseases of liver: Secondary | ICD-10-CM | POA: Diagnosis not present

## 2014-04-04 DIAGNOSIS — R7401 Elevation of levels of liver transaminase levels: Secondary | ICD-10-CM

## 2014-04-04 DIAGNOSIS — R740 Nonspecific elevation of levels of transaminase and lactic acid dehydrogenase [LDH]: Secondary | ICD-10-CM

## 2014-04-04 DIAGNOSIS — C7A098 Malignant carcinoid tumors of other sites: Secondary | ICD-10-CM

## 2014-04-04 DIAGNOSIS — D3A012 Benign carcinoid tumor of the ileum: Secondary | ICD-10-CM

## 2014-04-04 DIAGNOSIS — E3400 Carcinoid syndrome, unspecified: Secondary | ICD-10-CM | POA: Diagnosis not present

## 2014-04-04 DIAGNOSIS — E876 Hypokalemia: Secondary | ICD-10-CM

## 2014-04-04 NOTE — Telephone Encounter (Signed)
gv and printed appt sched and avs for tp fro OCT thru NOV.Marland KitchenMarland Kitchen

## 2014-04-05 NOTE — Progress Notes (Signed)
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  Chief Complaint  Patient presents with  . Follow-up    CURRENT THERAPY:Somatostatin LAR 30 mg IM was initiated on 07/07/2012 and is being administered monthly.  INTERVAL HISTORY:  Vanessa Waller 58 y.o. female with an history of carcinoid tumor of ileum presents for followup. She was last seen by Drue Second NP [facility] January 28 2014. She denies any recent hospitalizations or emergency room visits.  She states that she feell well overall.  Pap smear screening exam will be done this fall.  She will schedule her Mammogram in May 2016. She denies any abdominal pain.  She reports that her diarrhea is based on her dietary content, provoked by greasy food. This is controlled with immodium as needed. She has been receiving somatostatin LAR 30 mg IM every month. She continues to tolerate these shots without any problems. Her appetite and energy remains good.  She had a CT of her chest abdomen and pelvis on 10/25/2013. Patient has an appointment with her gynecologist and dermatologist coming up.  She denies any flushing, diarhea, wheezing, weight loss, anorexia or other constitutional symptoms.she had labs done on 03/22/14.  MEDICAL HISTORY: Past Medical History  Diagnosis Date  . 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 Cancer   . Nonspecific elevation of levels of transaminase or lactic acid dehydrogenase (LDH)   . Hypopotassemia   . Diverticulosis   . Hepatic steatosis 02/24/12  . Uterine fibroid   . Nephrolithiasis   . Small bowel obstruction   . 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     liver,mesentery,pelvis    INTERIM HISTORY: has TRANSAMINASES, SERUM, ELEVATED; Carcinoid tumor of ileum; Hepatic steatosis; Left breast mass; Diarrhea; Hypokalemia; Metastatic  carcinoid tumor to intra-abdominal site; and Hypersensitivity reaction on her problem list.    ALLERGIES:  is allergic to cephalexin and amoxicillin.  MEDICATIONS: has a current medication list which includes the following prescription(s): ascorbic acid, calcium carbonate-vitamin d, vitamin d-3, loperamide, multivitamin, octreotide, and potassium chloride sa.  SURGICAL HISTORY:  Past Surgical History  Procedure Laterality Date  . Tubal ligation    . Carcinoid tumor resection      ileum   PROBLEM LIST:  1. 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 The patient underwent surgical resection of her carcinoid tumor on 08/17/2005. Two out of 13 lymph nodes were positive. The patient's stage was III. The patient was noted to have an elevated urinary 5HIAA level in July 2011. A positive octreotide scan was noted coming from the right lower pelvis adjacent to the bladder on 09/06/2010. Initially this was felt to be a uterine fibroid. The patient has continued to have positive octreotide scan on 11/07/2011 and 05/06/2012. CT scans of the abdomen and pelvis were also carried out on 03/02/2010, 02/24/2012 and 06/30/2012. These scans in conjunction with an elevated 5HIAA level of 22.7 on 04/13/2012 and a chromogranin A level which had risen to 18.0 on 03/23/2012 strongly suggested that the patient's disease had recurred. The patient required admission to the hospital from 06/30/2012 through 07/03/2012 for an episode of upper abdominal pain which occurred suddenly the day prior to admission. The patient was started on somatostatin LAR 30 mg IM on 07/07/2012 for recurrent disease involving the lateral segment of the left hepatic lobe, a  right jejunal mesenteric node and 2 enhancing masses adjacent to the uterus. Twenty-four-hour urine 5HIAA on 10/19/2012 was 5.5. CT scan of the abdomen and pelvis with IV contrast on 10/19/2012 showed generally stable disease with the exception  of a decrease in the right abdominal mesenteric lymphadenopathy. There have been 2 lesions in the liver which are felt to be stable. One lesion is in the lateral segment of the left hepatic lobe measuring 14 x 14 mm. The second lesion is seen in the lateral segment adjacent to the falciform ligament and measures 9 mm.  2. Uterine fibroids noted on imaging studies dating back to late April 2010.  3. Elevated liver transaminases noted in March 2007. The patient was evaluated by Dr. Delfin Edis in October 2010 and felt that the etiology was hepatic steatosis.  4. Admission to the hospital for upper abdominal pain with sudden onset. Admission dates were 06/30/2012 through 07/03/2012. CT scan of the abdomen and pelvis with IV contrast on 06/30/2012 showed no definite etiology for the pain. It was noted that a lymph node in the right jejunal mesentery had developed central necrosis when compared with the prior study of 02/24/2012. There was no evidence for bowel obstruction. There has previously been no evidence for gallstones on recent CT scans, and an abdominal ultrasound dating back to 05/08/2009.  5. Possible mass involving the left breast on screening mammogram from 06/18/2012. A diagnostic mammogram and ultrasound of the left breast carried out on 07/17/2012 were normal. Followup screening mammogram was suggested in 1 year.  6. Diarrhea with onset in December 2013, somewhat improved with Imodium.    REVIEW OF SYSTEMS:   Constitutional: Denies fevers, chills or abnormal weight loss Eyes: Denies blurriness of vision Ears, nose, mouth, throat, and face: Denies mucositis or sore throat Respiratory: Denies cough, dyspnea or wheezes Cardiovascular: Denies palpitation, chest discomfort or lower extremity swelling Gastrointestinal:  Denies nausea, heartburn or positive for diarrhea Skin: Denies abnormal skin rashes Lymphatics: Denies new lymphadenopathy or easy bruising Neurological:Denies numbness, tingling  or new weaknesses Behavioral/Psych: Mood is stable, no new changes  All other systems were reviewed with the patient and are negative.  PHYSICAL EXAMINATION: ECOG PERFORMANCE STATUS: 0  Blood pressure 141/81, pulse 79, temperature 98.5 F (36.9 C), temperature source Oral, resp. rate 18, height 5\' 2"  (1.575 m), weight 137 lb 4.8 oz (62.279 kg).  GENERAL:alert, no distress and comfortable, female appears her stated age SKIN: skin color, texture, turgor are normal, no rashes or significant lesions EYES: normal, Conjunctiva are pink and non-injected, sclera clear OROPHARYNX:no exudate, no erythema and lips, buccal mucosa, and tongue normal  NECK: supple, thyroid normal size, non-tender, without nodularity LYMPH:  no palpable lymphadenopathy in the cervical, axillary or supraclavicular LUNGS: clear to auscultation and percussion with normal breathing effort HEART: regular rate & rhythm and no murmurs and no lower extremity edema ABDOMEN:abdomen soft, non-tender and normal bowel sounds; midline abdominal scar well-healed.  Musculoskeletal:no cyanosis of digits and no clubbing  NEURO: alert & oriented x 3 with fluent speech, no focal motor/sensory deficits   LABORATORY DATA:              RADIOGRAPHIC STUDIES:  CT ABDOMEN W WO CONTRAST 03/18/2014  CLINICAL DATA: History of carcinoid tumor of the ileum. EXAM: CT ABDOMEN AND PELVIS WITHOUT AND WITH CONTRAST  TECHNIQUE: Multidetector CT imaging of the abdomen and pelvis was performed following the standard protocol before and following the bolus administration of intravenous contrast. CONTRAST: 143mL OMNIPAQUE IOHEXOL 300  MG/ML SOLN COMPARISON: CT of the abdomen 10/25/2013. FINDINGS: Lung Bases: Unremarkable. Abdomen/Pelvis: Diffuse low attenuation throughout the hepatic  parenchyma, compatible with hepatic steatosis. Focal areas of fatty sparing are noted, most pronounced adjacent to the gallbladder  fossa. As with prior examinations,  there is a hypervascular lesion in segment 2 of the liver, which is similar in size to prior  examinations measuring approximately 1.3 x 1.5 cm on today's study (image 22 of series 3). There is also a small 1.3 x 0.8 cm  hypervascular lesion to the left side of the falciform ligament segment 3 (image 31 of series 3). There appear to be 2 new lesions  in the right lobe of the liver, both of which are in segment 8 near the dome measuring 7 mm (image 8 of series 3) and 6 mm (image 6 of  series 3). Multiple small lesions are again noted along the surface of the liver, largest of which is overlying segment 6, measuring 1.1  cm in length (image 36 of series 3), unchanged within measurement error compared to the prior examination. The appearance of the pancreas, spleen, bilateral adrenal glands and bilateral kidneys is unremarkable. Circumaortic left renal vein (normal anatomical variant) incidentally noted. As with prior examinations, there is a hypervascular mass associated with the right adnexa, which is similar in size measuring 4.4 x 4.2 cm on today's study (image 96 of series 5). A hypervascular lesion associated with the serosal surface of the posterior aspect of the uterus is similar in size measuring 2.1 x 1.3 cm (image 102 of series 5, as is a small hypervascular lesion in the left adnexa, which measures 1.2 x 1.1 cm on today's exam (image 100 of series 5). Several tiny hypervascular soft tissue nodules are again seen throughout the mesenteries and omentum, compatible with widespread intraperitoneal disease, largest of which is in the left lower quadrant anteriorly measuring 6 mm (image 91 of series 5). These findings are similar to prior studies. Multiple prominent borderline enlarged retroperitoneal and mesenteric lymph nodes are also similar to the prior examination, but nonspecific. No significant volume of ascites. No pneumoperitoneum. No pathologic distention of small bowel. Mild atherosclerosis in the  abdominal vasculature. Urinary bladder is unremarkable in appearance. Musculoskeletal: 1.4 cm short axis soft tissue nodule in the subcutaneous fat immediately above the left gluteal region appears to have a fatty hilum, favored to represent a mildly enlarged lymph node (image 61 of series 5), new compared to prior examinations. Notably, however, this does have a tiny focus of hyperenhancement in the posterior aspect of this lesion. There are no aggressive appearing lytic or blastic lesions noted in the visualized portions of the skeleton.  IMPRESSION:  1. Today's study demonstrates very slight progression of disease, as evidenced by the presence of 2 new small hypervascular lesions in  segment 8 of the liver. Additional metastatic disease in the liver, peritoneal cavity and adnexal regions otherwise appear very similar  to prior examinations, as above. However, there also what appears to be a mildly enlarged lymph node in the subcutaneous fat of the lower left flank immediately above the gluteal region, which has a tiny hypervascular area within it, which could represent an additional  site of metastasis. 2. Hepatic steatosis. Electronically Signed By: Vinnie Langton M.D. On: 03/28/2014 09:03  Ct Abdomen Pelvis W Wo Contrast  03/29/2013   *RADIOLOGY REPORT*  Clinical Data:  Followup metastatic carcinoid  CT CHEST, ABDOMEN AND PELVIS WITH CONTRAST  Technique:  Multidetector CT imaging of  the chest, abdomen and pelvis was performed following the standard protocol during bolus administration of intravenous contrast.  Contrast: 193mL OMNIPAQUE IOHEXOL 300 MG/ML  SOLN  Comparison:  10/19/2012  CT CHEST  Findings:  There is no pleural effusion identified.  The no airspace consolidation noted.  No pulmonary parenchymal nodule or mass noted.  Heart size is normal.  No pericardial or pleural effusion identified.  No enlarged axillary or supraclavicular adenopathy.  Review of the visualized bony structures is  negative for lytic or sclerotic bone lesion.  IMPRESSION:  1. No evidence for metastatic disease.  CT ABDOMEN AND PELVIS  Findings:  There is diffuse fatty infiltration of the liver.  There is a focal fatty sparing are identified adjacent to the gallbladder fossa.  Hypervascular liver lesion in the lateral segment of left hepatic lobe measures 1.4 cm, image 20/series 3.  This is unchanged from previous exam.  The subcapsular lesion overlying the inferior right hepatic lobe measures 0.9 cm, image 36/series 3.  This is also unchanged from previous exam.  The left hepatic lobe lesion adjacent to the falciform ligament measures 1.2 cm, image 29/series 3.  This is also unchanged.  No new liver lesions identified.  The gallbladder is normal.  No biliary dilatation.  Normal appearance of the pancreas.  The spleen is normal.  Normal appearance of both adrenal glands.  The right kidney is normal.  The left kidney is also normal.  Urinary bladder appears within normal limits.  Fibroid uterus is again identified. The heterogeneously enhancing mass in the right adnexa is stable measuring 4.7 cm, image 164/series 5.  The left adnexal lesion measures 4.2 cm, image 169/series 5.  Previously 4.1 cm.  The small hypervascular lesion within the cul-de-sac measures 1.9 cm, image 171/series 5.  This is stable from previous exam.  Small enhancing nodule along the undersurface of the left rectus muscle measures 0.8 cm, image 158/series 5.  This is stable from previous exam.  In the right iliac fossa there is a nodule which measures 1.7 cm, image 156/series 5.  This is unchanged from previous exam.  No new or enlarging peritoneal lesions identified.  The stomach appears normal.  The small bowel loops have a normal course and caliber.  There is no obstruction.  Review of the visualized osseous structures is sig significant for mild lumbar spondylosis.  No aggressive lytic or sclerotic bone lesions identified.  IMPRESSION:  1.   No acute  findings within the abdomen or pelvis. 2.   Stable peritoneal disease. 3.   Hypervascular liver lesions are unchanged from previous exam.   Original Report Authenticated By: Kerby Moors, M.D.   Ct Chest Abdomen and pelvis W Contrast  06/21/2013  CT CHEST FINDINGS Small axillary lymph nodes are similar to prior. No supraclavicular  lymph nodes. No mediastinal or hilar lymph nodes. No pericardial fluid. Review of the lung parenchyma demonstrates no suspicious pulmonary nodules. Airways are normal. CT ABDOMEN AND PELVIS FINDINGS There is again demonstrated a hypervascular lesions in the left hepatic lobe. Lesion in the left lateral hepatic lobe measures 14 mm unchanged from 14 mm on prior (image 23, series 2). Small lesion  along the falciform ligament measures 8 mm (image 31, series 2) unchanged from 8 mm on prior. Increased vascularity along the gallbladder fossa (image 30, 35 and 41 for example) is unchanged.  There are no new enhancing lesions within the liver. There is diffuse fatty infiltration of the liver. The gallbladder, pancreas, spleen, adrenal glands,  kidneys are normal. The stomach, duodenum, small bowel cecum are unchanged. There is a small rounded lesion adjacent to the terminal ileum measuring 13 x 14 mm (image 151, series 4) which is unchanged from 15 x 12 mm on prior. Colon and rectosigmoid colon are normal. Peritoneal disease appears stable. Peritoneal implants along the lateral margin of the right hepatic lobe measuring 7 mm unchanged from 8 mm on prior (image 101, series 4). Several smaller implants are noted along the inferior margin of the right hepatic lobe (image 112 and 113). Just deep to the left rectus muscle there is 8 mm nodule (image 150) unchanged from prior. Mass lesion along the right aspect of the uterine body measures 3.4 x 4.4 cm unchanged from 4.5 x 4.1 cm on prior remeasured. Nodule along the posterior wall of the uterus measures 19 mm compared a 18 mm on prior. There is  enhancing lesion in the left adnexal measuring 12 mm (image 167) which is also unchanged from prior. No new peritoneal lesions are identified. Abdominal aorta is normal caliber. The retroperitoneal enlarged necrotic lymph nodes seen on CT scan 06/30/2012 is no longer present.  In the pelvis, nodularity along the uterus described above. No new lesions are evident. No pelvic lymphadenopathy. No acute osseous abnormality. IMPRESSION: 1. No evidence of metastatic carcinoid disease progression. 2. No evidence metastasis in the thorax. 3. Stable enhancing lesions in the left hepatic lobe. 4. Stable peritoneal metastasis along the hepatic capsule, ventral peritoneal space, and bulky nodularity involving the right and left adnexa and uterus. 5. Stable small nodular lesion adjacent to the distal ileum.   CT of abdomen and pelvis 10/25/2013  CT ABDOMEN AND PELVIS WITH CONTRAST TECHNIQUE: Multidetector CT imaging of the abdomen and pelvis was performed using the standard protocol following bolus administration of  intravenous contrast. Imaging includes arterial phase, portal venous phase and delayed images. Sagittal and coronal MPR images reconstructed from axial data sets. CONTRAST: 173mL OMNIPAQUE IOHEXOL 300 MG/ML SOLN IV. Dilute oral contrast. COMPARISON: 06/21/2013 FINDINGS:  Lung bases clear. Diffuse fatty infiltration of liver. Multiple hypervascular hepatic nodules compatible with hepatic metastases again identified. 13 x 13 mm lesion lateral segment left lobe liver image 25  previously 14 x 12 mm. 6 x 6 mm right lobe liver lesion image 50, better defined, previously 5 x 5 mm.  Anterior lesion lateral segment left lobe 10 x 7 mm image 34 previously 9 x 6 mm. Additional areas of increased attenuation within liver adjacent to gallbladder fossa and porta hepatis may represent focal sparing though additional hepatic lesions are difficult to exclude. Multiple serosal nodules at liver compatible with peritoneal  metastases, largest 10 mm image 39. Additional tiny peritoneal based nodules are seen anteriorly in the abdomen and pelvis. Large right adnexal metastasis, 4.4 x 4.2 cm image 100 series for previously 4.4 x 4.0 cm, contiguous with the right lateral aspect of the uterus with question invasion. Additional tumor nodularity in left adnexa, grossly unchanged. Spleen, pancreas, kidneys, and adrenal glands normal appearance. Normal sized periportal and mesenteric lymph nodes without adenopathy. No free-fluid, free air focal inflammatory process. Bladder and ureters unremarkable. No acute osseous findings. IMPRESSION: Hepatic and peritoneal metastatic disease demonstrating little interval change since previous exam as above. No definite new intra-abdominal or intrapelvic abnormalities.    ASSESSMENT: Vanessa Waller 58 y.o. female with a history of Malignant Carcinoid tumor of Ileum.  PLAN:  1. Carcinoid tumor of ileum.  --The restaging CAT scan done shows mild progression  of disease with 2 small liver 6 and 7 mm lesions. I will still consider it well controlled disease on Sandostatin therapy.  Her urine marker is trending down and serum Chromogranin A is trending up but within normal range. I will favor continuation of Sandostatin therapy at this time. We can increase the dose further if needed to 60 mg IM monthly or consider adding a systemic agent like Afinitor, Sutent, Xeloda, Avastin, Temodar or Folfox regimen.  2. History of Hypokalemia (likely secondary to diarrhea that is mild). -Her potassium is 3.6 today. We counseled the patient to continue for oral potassium supplementation.  3. Fatty liver/elevated transaminases. -Her liver enzymes today are elevated. We will repeat comprehensive metabolic panel her next visit. She was refered to GI (seen by Dr. Delfin Edis in October 2010 and felt that the etiology was hepatic steatosis) for further recommendations. He last visit was in the Spring of 2014.     4. Cancer screening. --Patient will have her annual Pap scheduled in May.  She reports being up-to-date colonoscopy.  She will schedule a repeat mammogram for this year in May 2016.   5. Follow up. --RTC in December 2016 with labs and serum marker Chromogranin.   All questions were answered. The patient knows to call the clinic with any problems, questions or concerns. We can certainly see the patient much sooner if necessary. She was also provided and after visit summary  I spent 25 minutes counseling the patient face to face. The total time spent in the appointment was 35 minutes.    Bernadene Bell, MD Medical Hematologist/Oncologist [facility] Pager: [phone] Office No: [phone]

## 2014-04-18 ENCOUNTER — Ambulatory Visit (HOSPITAL_BASED_OUTPATIENT_CLINIC_OR_DEPARTMENT_OTHER): Payer: 59

## 2014-04-18 VITALS — BP 149/85 | HR 89 | Temp 98.3°F

## 2014-04-18 DIAGNOSIS — E340 Carcinoid syndrome: Secondary | ICD-10-CM

## 2014-04-18 DIAGNOSIS — D3A012 Benign carcinoid tumor of the ileum: Secondary | ICD-10-CM | POA: Diagnosis not present

## 2014-04-18 MED ORDER — OCTREOTIDE ACETATE 30 MG IM KIT
30.0000 mg | PACK | Freq: Once | INTRAMUSCULAR | Status: AC
  Administered 2014-04-18: 30 mg via INTRAMUSCULAR
  Filled 2014-04-18: qty 1

## 2014-05-16 ENCOUNTER — Ambulatory Visit (HOSPITAL_BASED_OUTPATIENT_CLINIC_OR_DEPARTMENT_OTHER): Payer: 59

## 2014-05-16 DIAGNOSIS — E340 Carcinoid syndrome: Secondary | ICD-10-CM

## 2014-05-16 DIAGNOSIS — D3A012 Benign carcinoid tumor of the ileum: Secondary | ICD-10-CM

## 2014-05-16 MED ORDER — OCTREOTIDE ACETATE 30 MG IM KIT
30.0000 mg | PACK | Freq: Once | INTRAMUSCULAR | Status: AC
  Administered 2014-05-16: 30 mg via INTRAMUSCULAR
  Filled 2014-05-16: qty 1

## 2014-05-16 NOTE — Patient Instructions (Signed)

## 2014-06-06 ENCOUNTER — Telehealth: Payer: Self-pay | Admitting: Hematology

## 2014-06-06 NOTE — Telephone Encounter (Signed)
PT RETURNED CALL AND ASKED THAT I CALL HER BACK RE THE APPT THAT WAS R/S. CALLED PT AT NUMBER SHE LEFT AND ALSO NUMBER MARKED AS PREFERRED IN SYS ([phone]) - WAS NOT ABLE TO REACH PT AND LM. PT IDENTIFIED ON VM.

## 2014-06-06 NOTE — Telephone Encounter (Signed)
moved 11/30 appt from CP1 to 12/1 w/YF. lmonvm for pt on preferred number - pt identifed on vm. schedule mailed.

## 2014-06-07 ENCOUNTER — Telehealth: Payer: Self-pay | Admitting: Hematology

## 2014-06-07 NOTE — Telephone Encounter (Signed)
LM to confirm appt 06/14/14.

## 2014-06-07 NOTE — Telephone Encounter (Signed)
pt lmonvm that she received my message and 12/1 was ok. I did return call and s/w pt informing her the AS would no longer be here and she would see YF.

## 2014-06-13 ENCOUNTER — Other Ambulatory Visit: Payer: 59

## 2014-06-13 ENCOUNTER — Ambulatory Visit: Payer: 59

## 2014-06-14 ENCOUNTER — Encounter: Payer: Self-pay | Admitting: Hematology

## 2014-06-14 ENCOUNTER — Ambulatory Visit (HOSPITAL_BASED_OUTPATIENT_CLINIC_OR_DEPARTMENT_OTHER): Payer: 59

## 2014-06-14 ENCOUNTER — Other Ambulatory Visit (HOSPITAL_BASED_OUTPATIENT_CLINIC_OR_DEPARTMENT_OTHER): Payer: 59

## 2014-06-14 ENCOUNTER — Ambulatory Visit (HOSPITAL_BASED_OUTPATIENT_CLINIC_OR_DEPARTMENT_OTHER): Payer: 59 | Admitting: Hematology

## 2014-06-14 ENCOUNTER — Telehealth: Payer: Self-pay | Admitting: Hematology

## 2014-06-14 VITALS — BP 143/82 | HR 81 | Temp 98.1°F | Resp 18 | Ht 62.0 in | Wt 136.6 lb

## 2014-06-14 DIAGNOSIS — D3A012 Benign carcinoid tumor of the ileum: Secondary | ICD-10-CM

## 2014-06-14 DIAGNOSIS — C7A098 Malignant carcinoid tumors of other sites: Secondary | ICD-10-CM

## 2014-06-14 DIAGNOSIS — K769 Liver disease, unspecified: Secondary | ICD-10-CM

## 2014-06-14 DIAGNOSIS — C7B09 Secondary carcinoid tumors of other sites: Secondary | ICD-10-CM

## 2014-06-14 LAB — COMPREHENSIVE METABOLIC PANEL (CC13)
ALK PHOS: 79 U/L (ref 40–150)
ALT: 75 U/L — ABNORMAL HIGH (ref 0–55)
AST: 70 U/L — ABNORMAL HIGH (ref 5–34)
Albumin: 4.3 g/dL (ref 3.5–5.0)
Anion Gap: 11 mEq/L (ref 3–11)
BILIRUBIN TOTAL: 0.66 mg/dL (ref 0.20–1.20)
BUN: 9.6 mg/dL (ref 7.0–26.0)
CO2: 27 mEq/L (ref 22–29)
Calcium: 10.1 mg/dL (ref 8.4–10.4)
Chloride: 105 mEq/L (ref 98–109)
Creatinine: 0.9 mg/dL (ref 0.6–1.1)
Glucose: 107 mg/dl (ref 70–140)
Potassium: 3.3 mEq/L — ABNORMAL LOW (ref 3.5–5.1)
Sodium: 144 mEq/L (ref 136–145)
Total Protein: 8.2 g/dL (ref 6.4–8.3)

## 2014-06-14 LAB — CBC WITH DIFFERENTIAL/PLATELET
BASO%: 1.4 % (ref 0.0–2.0)
Basophils Absolute: 0.1 10*3/uL (ref 0.0–0.1)
EOS%: 1.1 % (ref 0.0–7.0)
Eosinophils Absolute: 0.1 10*3/uL (ref 0.0–0.5)
HEMATOCRIT: 43.6 % (ref 34.8–46.6)
HGB: 14.2 g/dL (ref 11.6–15.9)
LYMPH%: 40.8 % (ref 14.0–49.7)
MCH: 29.8 pg (ref 25.1–34.0)
MCHC: 32.6 g/dL (ref 31.5–36.0)
MCV: 91.5 fL (ref 79.5–101.0)
MONO#: 0.3 10*3/uL (ref 0.1–0.9)
MONO%: 5.2 % (ref 0.0–14.0)
NEUT#: 2.6 10*3/uL (ref 1.5–6.5)
NEUT%: 51.5 % (ref 38.4–76.8)
Platelets: 249 10*3/uL (ref 145–400)
RBC: 4.77 10*6/uL (ref 3.70–5.45)
RDW: 12.6 % (ref 11.2–14.5)
WBC: 5 10*3/uL (ref 3.9–10.3)
lymph#: 2 10*3/uL (ref 0.9–3.3)

## 2014-06-14 MED ORDER — OCTREOTIDE ACETATE 30 MG IM KIT
30.0000 mg | PACK | Freq: Once | INTRAMUSCULAR | Status: AC
  Administered 2014-06-14: 30 mg via INTRAMUSCULAR
  Filled 2014-06-14: qty 1

## 2014-06-14 NOTE — Telephone Encounter (Signed)
Pt confirmed labs/ov/inj per 12/01 POF, gave pt AVS.... KJ

## 2014-06-14 NOTE — Progress Notes (Signed)
[place] OFFICE PROGRESS NOTE  Patient Care Team: [name], PA-C as PCP - General (Physician Assistant)  DIAGNOSIS: 1. 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  2. 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 The patient underwent surgical resection of her carcinoid tumor on 08/17/2005. Two out of 13 lymph nodes were positive. The patient's stage was III. The patient was noted to have an elevated urinary 5HIAA level in July 2011. A positive octreotide scan was noted coming from the right lower pelvis adjacent to the bladder on 09/06/2010. Initially this was felt to be a uterine fibroid. The patient has continued to have positive octreotide scan on 11/07/2011 and 05/06/2012. CT scans of the abdomen and pelvis were also carried out on 03/02/2010, 02/24/2012 and 06/30/2012. These scans in conjunction with an elevated 5HIAA level of 22.7 on 04/13/2012 and a chromogranin A level which had risen to 18.0 on 03/23/2012 strongly suggested that the patient's disease had recurred. The patient required admission to the hospital from 06/30/2012 through 07/03/2012 for an episode of upper abdominal pain which occurred suddenly the day prior to admission. The patient was started on somatostatin LAR 30 mg IM on 07/07/2012 for recurrent disease involving the lateral segment of the left hepatic lobe, a right jejunal mesenteric node and 2 enhancing masses adjacent to the uterus. Twenty-four-hour urine 5HIAA on 10/19/2012 was 5.5. CT scan of the abdomen and pelvis with IV contrast on 10/19/2012 showed generally stable disease with the exception of a decrease in the right abdominal mesenteric lymphadenopathy. There have been 2 lesions in the liver which are felt to be stable. One lesion is in the lateral segment of the  left hepatic lobe measuring 14 x 14 mm. The second lesion is seen in the lateral segment adjacent to the falciform ligament and measures 9 mm.  2. Uterine fibroids noted on imaging studies dating back to late April 2010.  3. Elevated liver transaminases noted in March 2007. The patient was evaluated by Dr. Delfin Edis in October 2010 and felt that the etiology was hepatic steatosis.  4. Admission to the hospital for upper abdominal pain with sudden onset. Admission dates were 06/30/2012 through 07/03/2012. CT scan of the abdomen and pelvis with IV contrast on 06/30/2012 showed no definite etiology for the pain. It was noted that a lymph node in the right jejunal mesentery had developed central necrosis when compared with the prior study of 02/24/2012. There was no evidence for bowel obstruction. There has previously been no evidence for gallstones on recent CT scans, and an abdominal ultrasound dating back to 05/08/2009.  5. Possible mass involving the left breast on screening mammogram from 06/18/2012. A diagnostic mammogram and ultrasound of the left breast carried out on 07/17/2012 were normal. Followup screening mammogram was suggested in 1 year.  6. Diarrhea with onset in December 2013, somewhat improved with Imodium.   CURRENT THERAPY:  somatostatin LAR 30 mg IM started on 07/07/2012   INTERVAL HISTORY: She returns for follow up. She was last seen by Dr. Arlys John 2 month ago. She is doing very well overall, no complains. She tolerates octreotide injection very well. She takes imodium twice a week, usuallu related to diet. No pain, fever, no weight loss.  She is going to see her urologist for kidney stone follow up tomorrow.  REVIEW OF SYSTEMS:   Constitutional: Denies fevers, chills or abnormal weight loss Eyes: Denies blurriness of vision Ears, nose, mouth, throat, and face: Denies mucositis or sore throat Respiratory: Denies cough, dyspnea or wheezes Cardiovascular: Denies  palpitation, chest discomfort or lower extremity swelling Gastrointestinal:  Denies nausea, heartburn or change in bowel habits Skin: Denies abnormal skin rashes Lymphatics: Denies new lymphadenopathy or easy bruising Neurological:Denies numbness, tingling or new weaknesses Behavioral/Psych: Mood is stable, no new changes  All other systems were reviewed with the patient and are negative.  MEDICAL HISTORY:  Past Medical History  Diagnosis Date  . 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 Cancer   . Nonspecific elevation of levels of transaminase or lactic acid dehydrogenase (LDH)   . Hypopotassemia   . Diverticulosis   . Hepatic steatosis 02/24/12  . Uterine fibroid   . Nephrolithiasis   . Small bowel obstruction   . 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     liver,mesentery,pelvis    SURGICAL HISTORY: Past Surgical History  Procedure Laterality Date  . Tubal ligation    . Carcinoid tumor resection      ileum    I have reviewed the social history and family history with the patient and they are unchanged from previous note.  ALLERGIES:  is allergic to cephalexin and amoxicillin.  MEDICATIONS:  Current Outpatient Prescriptions  Medication Sig Dispense Refill  . ascorbic acid (VITAMIN C) 500 MG tablet Take 500 mg by mouth daily.    . Calcium Carbonate-Vitamin D (CALCIUM 600-D PO) Take 1 tablet by mouth daily.    . Cholecalciferol (VITAMIN D-3) 5000 UNITS TABS Take 1 tablet by mouth daily.    [name] HYDROcodone-acetaminophen (NORCO/VICODIN) 5-325 MG per tablet Take 1 tablet by mouth every 4 (four) hours as needed. for pain  0  . loperamide (IMODIUM) 2 MG capsule Take by mouth as needed for diarrhea or loose stools.    . Multiple Vitamin (MULTIVITAMIN) tablet Take 1 tablet by mouth daily.    [name] octreotide (SANDOSTATIN LAR) 30 MG injection Inject 30 mg into the muscle every 28 (twenty-eight) days.    . ondansetron (ZOFRAN-ODT) 4 MG disintegrating tablet   0  .  potassium chloride SA (K-DUR,KLOR-CON) 20 MEQ tablet Take 1 tablet (20 mEq total) by mouth every other day. 45 tablet 2  . tamsulosin (FLOMAX) 0.4 MG CAPS capsule   0   No current facility-administered medications for this visit.    PHYSICAL EXAMINATION: ECOG PERFORMANCE STATUS: 0 - Asymptomatic  Filed Vitals:   06/14/14 0830  BP: 143/82  Pulse: 81  Temp: 98.1 F (36.7 C)  Resp: 18   Filed Weights   06/14/14 0830  Weight: 136 lb 9.6 oz (61.961 kg)    GENERAL:alert, no distress and comfortable SKIN: skin color, texture, turgor are normal, no rashes or significant lesions EYES: normal, Conjunctiva are pink and non-injected, sclera clear OROPHARYNX:no exudate, no erythema and lips, buccal mucosa, and tongue normal  NECK: supple, thyroid normal size, non-tender, without nodularity LYMPH:  no palpable lymphadenopathy in the cervical, axillary or inguinal LUNGS: clear to auscultation and percussion with normal breathing effort HEART: regular rate & rhythm and no murmurs and no lower extremity edema ABDOMEN:abdomen soft, non-tender and normal bowel sounds Musculoskeletal:no cyanosis of digits and no clubbing  NEURO: alert & oriented x 3 with fluent speech, no focal motor/sensory deficits  LABORATORY DATA:  I have reviewed the data as listed    Component Value Date/Time  NA 144 06/14/2014 0812   NA 135 07/01/2012 0357   NA 137 10/01/2010 0000   K 3.3* 06/14/2014 0812   K 4.0 07/01/2012 0357   K 4.1 10/01/2010 0000   CL 102 12/28/2012 0824   CL 99 07/01/2012 0357   CL 100 10/01/2010 0000   CO2 27 06/14/2014 0812   CO2 25 07/01/2012 0357   CO2 28 10/01/2010 0000   GLUCOSE 107 06/14/2014 0812   GLUCOSE 105* 12/28/2012 0824   GLUCOSE 91 07/01/2012 0357   GLUCOSE 102 10/01/2010 0000   BUN 9.6 06/14/2014 0812   BUN 10 07/01/2012 0357   BUN 13 10/01/2010 0000   CREATININE 0.9 06/14/2014 0812   CREATININE 0.82 07/01/2012 0357   CREATININE 0.83 10/07/2011 0817   CREATININE  0.9 10/01/2010 0000   CALCIUM 10.1 06/14/2014 0812   CALCIUM 9.0 07/01/2012 0357   CALCIUM 9.3 10/01/2010 0000   PROT 8.2 06/14/2014 0812   PROT 7.7 07/01/2012 0357   PROT 7.6 10/01/2010 0000   ALBUMIN 4.3 06/14/2014 0812   ALBUMIN 3.7 07/01/2012 0357   AST 70* 06/14/2014 0812   AST 31 07/01/2012 0357   AST 80* 10/01/2010 0000   ALT 75* 06/14/2014 0812   ALT 33 07/01/2012 0357   ALT 90* 10/01/2010 0000   ALKPHOS 79 06/14/2014 0812   ALKPHOS 54 07/01/2012 0357   ALKPHOS 64 10/01/2010 0000   BILITOT 0.66 06/14/2014 0812   BILITOT 0.7 07/01/2012 0357   BILITOT 0.50 10/01/2010 0000   GFRNONAA 79* 07/01/2012 0357   GFRAA >90 07/01/2012 0357    No results found for: SPEP, UPEP  Lab Results  Component Value Date   WBC 5.0 06/14/2014   NEUTROABS 2.6 06/14/2014   HGB 14.2 06/14/2014   HCT 43.6 06/14/2014   MCV 91.5 06/14/2014   PLT 249 06/14/2014      Chemistry      Component Value Date/Time   NA 144 06/14/2014 0812   NA 135 07/01/2012 0357   NA 137 10/01/2010 0000   K 3.3* 06/14/2014 0812   K 4.0 07/01/2012 0357   K 4.1 10/01/2010 0000   CL 102 12/28/2012 0824   CL 99 07/01/2012 0357   CL 100 10/01/2010 0000   CO2 27 06/14/2014 0812   CO2 25 07/01/2012 0357   CO2 28 10/01/2010 0000   BUN 9.6 06/14/2014 0812   BUN 10 07/01/2012 0357   BUN 13 10/01/2010 0000   CREATININE 0.9 06/14/2014 0812   CREATININE 0.82 07/01/2012 0357   CREATININE 0.83 10/07/2011 0817   CREATININE 0.9 10/01/2010 0000      Component Value Date/Time   CALCIUM 10.1 06/14/2014 0812   CALCIUM 9.0 07/01/2012 0357   CALCIUM 9.3 10/01/2010 0000   ALKPHOS 79 06/14/2014 0812   ALKPHOS 54 07/01/2012 0357   ALKPHOS 64 10/01/2010 0000   AST 70* 06/14/2014 0812   AST 31 07/01/2012 0357   AST 80* 10/01/2010 0000   ALT 75* 06/14/2014 0812   ALT 33 07/01/2012 0357   ALT 90* 10/01/2010 0000   BILITOT 0.66 06/14/2014 0812   BILITOT 0.7 07/01/2012 0357   BILITOT 0.50 10/01/2010 0000        RADIOGRAPHIC STUDIES: No new scans since last visit   ASSESSMENT & PLAN:  1. Carcinoid tumor of ileum.  --The restaging CAT scan in 03/2014 shows mild progression of disease with 2 small liver 6 and 7 mm lesions. She is clinically doing very well, will continue on Santostatin therapy.   -  Her serum Chromogranin A and urine 5HIAA are still pending today.  -restaging in 2 month  -If further small progression, will increase sandostatin dose, or conside Afinitor or Sutent.  She will continue follow up with her PCP and other specialists.   Return to my clinic in 2 month with lab and scan one week before.    Orders Placed This Encounter  Procedures  . CT Abdomen W Contrast    Standing Status: Future     Number of Occurrences:      Standing Expiration Date: 06/15/2015    Order Specific Question:  Reason for Exam (SYMPTOM  OR DIAGNOSIS REQUIRED)    Answer:  restaging for carcinoid tumor    Order Specific Question:  Is the patient pregnant?    Answer:  No    Order Specific Question:  Preferred imaging location?    Answer:  [facility]  . CT Chest W Contrast    Standing Status: Future     Number of Occurrences:      Standing Expiration Date: 06/15/2015    Order Specific Question:  Reason for Exam (SYMPTOM  OR DIAGNOSIS REQUIRED)    Answer:  restaging for carcinoid tumor    Order Specific Question:  Is the patient pregnant?    Answer:  No    Order Specific Question:  Preferred imaging location?    Answer:  [facility]  . CBC with Differential    Standing Status: Future     Number of Occurrences:      Standing Expiration Date: 06/15/2015  . Comprehensive metabolic panel (Cmet) - [facility]    Standing Status: Future     Number of Occurrences:      Standing Expiration Date: 06/15/2015  . Chromogranin A    Standing Status: Future     Number of Occurrences:      Standing Expiration Date: 06/15/2015  . 5 HIAA, quantitative, urine, 24 hour    Standing Status: Future      Number of Occurrences:      Standing Expiration Date: 06/15/2015   All questions were answered. The patient knows to call the clinic with any problems, questions or concerns. No barriers to learning was detected.  I spent 20 minutes counseling the patient face to face. The total time spent in the appointment was 30 minutes and more than 50% was on counseling and review of test results     Truitt Merle, MD 06/14/2014 4:00 PM

## 2014-06-14 NOTE — Patient Instructions (Signed)

## 2014-06-17 ENCOUNTER — Telehealth: Payer: Self-pay | Admitting: *Deleted

## 2014-06-17 DIAGNOSIS — E876 Hypokalemia: Secondary | ICD-10-CM

## 2014-06-17 MED ORDER — POTASSIUM CHLORIDE CRYS ER 20 MEQ PO TBCR: 20.0000 meq | EXTENDED_RELEASE_TABLET | Freq: Every day | ORAL | Status: DC

## 2014-06-17 NOTE — Telephone Encounter (Signed)
Received vm call from pt asking for K+ level from 06/14/14 & if she needs to cont will need a refill on K+ to [facility]..  Called pt back & informed to increase K+ to daily 20 MEQ & script to [facility].

## 2014-06-20 ENCOUNTER — Other Ambulatory Visit: Payer: Self-pay | Admitting: Obstetrics and Gynecology

## 2014-06-20 LAB — CHROMOGRANIN A: Chromogranin A: 10 ng/mL (ref <=15)

## 2014-06-22 ENCOUNTER — Telehealth: Payer: Self-pay | Admitting: *Deleted

## 2014-06-22 LAB — CYTOLOGY - PAP

## 2014-06-22 NOTE — Telephone Encounter (Signed)
Received fax requesting clarification.  "K-Dur 30meq and Klor-Con Tab 47meq are not interchangeable."   Called pharmacist, Rosalind to inquire about the difference.  The K-Dur is scored, therefore breakable whereas the Klor-Con is not scored.  Told pharmacist to go ahead with the K-Dur.

## 2014-07-06 ENCOUNTER — Encounter: Payer: Self-pay | Admitting: Hematology

## 2014-07-11 ENCOUNTER — Ambulatory Visit (HOSPITAL_BASED_OUTPATIENT_CLINIC_OR_DEPARTMENT_OTHER): Payer: 59

## 2014-07-11 DIAGNOSIS — D3A012 Benign carcinoid tumor of the ileum: Secondary | ICD-10-CM

## 2014-07-11 DIAGNOSIS — E340 Carcinoid syndrome: Secondary | ICD-10-CM

## 2014-07-11 DIAGNOSIS — C7B09 Secondary carcinoid tumors of other sites: Secondary | ICD-10-CM

## 2014-07-11 MED ORDER — OCTREOTIDE ACETATE 30 MG IM KIT
30.0000 mg | PACK | Freq: Once | INTRAMUSCULAR | Status: AC
  Administered 2014-07-11: 30 mg via INTRAMUSCULAR
  Filled 2014-07-11: qty 1

## 2014-07-11 NOTE — Patient Instructions (Signed)

## 2014-07-30 ENCOUNTER — Other Ambulatory Visit: Payer: Self-pay | Admitting: Hematology

## 2014-07-30 DIAGNOSIS — C7B09 Secondary carcinoid tumors of other sites: Secondary | ICD-10-CM

## 2014-08-01 ENCOUNTER — Other Ambulatory Visit (HOSPITAL_BASED_OUTPATIENT_CLINIC_OR_DEPARTMENT_OTHER): Payer: 59

## 2014-08-01 ENCOUNTER — Encounter (HOSPITAL_COMMUNITY): Payer: Self-pay

## 2014-08-01 ENCOUNTER — Other Ambulatory Visit: Payer: Self-pay | Admitting: *Deleted

## 2014-08-01 ENCOUNTER — Other Ambulatory Visit: Payer: Self-pay | Admitting: Hematology

## 2014-08-01 ENCOUNTER — Ambulatory Visit (HOSPITAL_COMMUNITY)
Admission: RE | Admit: 2014-08-01 | Discharge: 2014-08-01 | Disposition: A | Discharge status: Home / Self Care | Payer: 59 | Source: Ambulatory Visit | Attending: Hematology | Admitting: Hematology

## 2014-08-01 DIAGNOSIS — C7A012 Malignant carcinoid tumor of the ileum: Secondary | ICD-10-CM

## 2014-08-01 DIAGNOSIS — C7B09 Secondary carcinoid tumors of other sites: Secondary | ICD-10-CM

## 2014-08-01 DIAGNOSIS — K760 Fatty (change of) liver, not elsewhere classified: Secondary | ICD-10-CM | POA: Diagnosis not present

## 2014-08-01 DIAGNOSIS — C7B02 Secondary carcinoid tumors of liver: Secondary | ICD-10-CM

## 2014-08-01 LAB — COMPREHENSIVE METABOLIC PANEL (CC13)
ALK PHOS: 86 U/L (ref 40–150)
ALT: 94 U/L — ABNORMAL HIGH (ref 0–55)
AST: 88 U/L — ABNORMAL HIGH (ref 5–34)
Albumin: 4.4 g/dL (ref 3.5–5.0)
Anion Gap: 10 mEq/L (ref 3–11)
BILIRUBIN TOTAL: 0.8 mg/dL (ref 0.20–1.20)
BUN: 11.1 mg/dL (ref 7.0–26.0)
CHLORIDE: 104 meq/L (ref 98–109)
CO2: 29 meq/L (ref 22–29)
Calcium: 9.1 mg/dL (ref 8.4–10.4)
Creatinine: 0.9 mg/dL (ref 0.6–1.1)
EGFR: 69 mL/min/{1.73_m2} — AB (ref >90)
Glucose: 109 mg/dl (ref 70–140)
Potassium: 3.8 mEq/L (ref 3.5–5.1)
SODIUM: 143 meq/L (ref 136–145)
Total Protein: 8.1 g/dL (ref 6.4–8.3)

## 2014-08-01 LAB — CBC WITH DIFFERENTIAL/PLATELET
BASO%: 0.6 % (ref 0.0–2.0)
Basophils Absolute: 0 10*3/uL (ref 0.0–0.1)
EOS%: 0.4 % (ref 0.0–7.0)
Eosinophils Absolute: 0 10*3/uL (ref 0.0–0.5)
HCT: 45.8 % (ref 34.8–46.6)
HGB: 14.9 g/dL (ref 11.6–15.9)
LYMPH#: 1.6 10*3/uL (ref 0.9–3.3)
LYMPH%: 30.6 % (ref 14.0–49.7)
MCH: 30.1 pg (ref 25.1–34.0)
MCHC: 32.5 g/dL (ref 31.5–36.0)
MCV: 92.5 fL (ref 79.5–101.0)
MONO#: 0.3 10*3/uL (ref 0.1–0.9)
MONO%: 5.5 % (ref 0.0–14.0)
NEUT%: 62.9 % (ref 38.4–76.8)
NEUTROS ABS: 3.2 10*3/uL (ref 1.5–6.5)
Platelets: 228 10*3/uL (ref 145–400)
RBC: 4.95 10*6/uL (ref 3.70–5.45)
RDW: 12.3 % (ref 11.2–14.5)
WBC: 5.1 10*3/uL (ref 3.9–10.3)

## 2014-08-01 MED ORDER — IOHEXOL 300 MG/ML  SOLN
100.0000 mL | Freq: Once | INTRAMUSCULAR | Status: AC | PRN
  Administered 2014-08-01: 100 mL via INTRAVENOUS

## 2014-08-04 LAB — CHROMOGRANIN A: Chromogranin A: 16 ng/mL — ABNORMAL HIGH (ref <=15)

## 2014-08-04 LAB — 5 HIAA, QUANTITATIVE, URINE, 24 HOUR
5-HIAA, 24 Hr Urine: 3.6 mg/24 h (ref <=6.0)
Volume, Urine-5HIAA: 1700 mL/24 h

## 2014-08-08 ENCOUNTER — Telehealth: Payer: Self-pay | Admitting: Hematology

## 2014-08-08 ENCOUNTER — Encounter: Payer: Self-pay | Admitting: Hematology

## 2014-08-08 ENCOUNTER — Ambulatory Visit (HOSPITAL_BASED_OUTPATIENT_CLINIC_OR_DEPARTMENT_OTHER): Payer: 59 | Admitting: Hematology

## 2014-08-08 ENCOUNTER — Ambulatory Visit (HOSPITAL_BASED_OUTPATIENT_CLINIC_OR_DEPARTMENT_OTHER): Payer: 59

## 2014-08-08 VITALS — BP 145/76 | HR 90 | Temp 97.6°F | Resp 18 | Ht 62.0 in | Wt 135.2 lb

## 2014-08-08 DIAGNOSIS — C7B04 Secondary carcinoid tumors of peritoneum: Secondary | ICD-10-CM

## 2014-08-08 DIAGNOSIS — R197 Diarrhea, unspecified: Secondary | ICD-10-CM

## 2014-08-08 DIAGNOSIS — C7B09 Secondary carcinoid tumors of other sites: Secondary | ICD-10-CM

## 2014-08-08 DIAGNOSIS — C7A012 Malignant carcinoid tumor of the ileum: Secondary | ICD-10-CM

## 2014-08-08 DIAGNOSIS — D3A012 Benign carcinoid tumor of the ileum: Secondary | ICD-10-CM

## 2014-08-08 DIAGNOSIS — C7B02 Secondary carcinoid tumors of liver: Secondary | ICD-10-CM

## 2014-08-08 MED ORDER — OCTREOTIDE ACETATE 30 MG IM KIT
30.0000 mg | PACK | Freq: Once | INTRAMUSCULAR | Status: AC
  Administered 2014-08-08: 30 mg via INTRAMUSCULAR
  Filled 2014-08-08: qty 1

## 2014-08-08 NOTE — Telephone Encounter (Signed)
gv and printed appt sched and avs forpt for Feb and March °

## 2014-08-08 NOTE — Progress Notes (Signed)
[place] OFFICE PROGRESS NOTE  Patient Care Team: [name], PA-C as PCP - General (Physician Assistant)  DIAGNOSIS: 1. 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  2. 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 The patient underwent surgical resection of her carcinoid tumor on 08/17/2005. Two out of 13 lymph nodes were positive. The patient's stage was III. The patient was noted to have an elevated urinary 5HIAA level in July 2011. A positive octreotide scan was noted coming from the right lower pelvis adjacent to the bladder on 09/06/2010. Initially this was felt to be a uterine fibroid. The patient has continued to have positive octreotide scan on 11/07/2011 and 05/06/2012. CT scans of the abdomen and pelvis were also carried out on 03/02/2010, 02/24/2012 and 06/30/2012. These scans in conjunction with an elevated 5HIAA level of 22.7 on 04/13/2012 and a chromogranin A level which had risen to 18.0 on 03/23/2012 strongly suggested that the patient's disease had recurred. The patient required admission to the hospital from 06/30/2012 through 07/03/2012 for an episode of upper abdominal pain which occurred suddenly the day prior to admission. The patient was started on somatostatin LAR 30 mg IM on 07/07/2012 for recurrent disease involving the lateral segment of the left hepatic lobe, a right jejunal mesenteric node and 2 enhancing masses adjacent to the uterus. Twenty-four-hour urine 5HIAA on 10/19/2012 was 5.5. CT scan of the abdomen and pelvis with IV contrast on 10/19/2012 showed generally stable disease with the exception of a decrease in the right abdominal mesenteric lymphadenopathy. There have been 2 lesions in the liver which are felt to be stable. One lesion is in the lateral segment of the  left hepatic lobe measuring 14 x 14 mm. The second lesion is seen in the lateral segment adjacent to the falciform ligament and measures 9 mm.  2. Uterine fibroids noted on imaging studies dating back to late April 2010.  3. Elevated liver transaminases noted in March 2007. The patient was evaluated by Dr. Delfin Edis in October 2010 and felt that the etiology was hepatic steatosis.  4. Admission to the hospital for upper abdominal pain with sudden onset. Admission dates were 06/30/2012 through 07/03/2012. CT scan of the abdomen and pelvis with IV contrast on 06/30/2012 showed no definite etiology for the pain. It was noted that a lymph node in the right jejunal mesentery had developed central necrosis when compared with the prior study of 02/24/2012. There was no evidence for bowel obstruction. There has previously been no evidence for gallstones on recent CT scans, and an abdominal ultrasound dating back to 05/08/2009.  5. Possible mass involving the left breast on screening mammogram from 06/18/2012. A diagnostic mammogram and ultrasound of the left breast carried out on 07/17/2012 were normal. Followup screening mammogram was suggested in 1 year.  6. Diarrhea with onset in December 2013, somewhat improved with Imodium.   CURRENT THERAPY:  somatostatin LAR 30 mg IM started on 07/07/2012   INTERVAL HISTORY: She returns for follow up. She is doing well overall. She had a kidney stone which passed in Nov 2015. No pain, nausea, occasional diarrhea, no constipation, no hot flush, or other symptoms. She has good appetite and weight is stable.   REVIEW OF SYSTEMS:   Constitutional: Denies fevers, chills or abnormal weight loss Eyes: Denies blurriness of  vision Ears, nose, mouth, throat, and face: Denies mucositis or sore throat Respiratory: Denies cough, dyspnea or wheezes Cardiovascular: Denies palpitation, chest discomfort or lower extremity swelling Gastrointestinal:  Denies nausea, heartburn  or change in bowel habits Skin: Denies abnormal skin rashes Lymphatics: Denies new lymphadenopathy or easy bruising Neurological:Denies numbness, tingling or new weaknesses Behavioral/Psych: Mood is stable, no new changes  All other systems were reviewed with the patient and are negative.  MEDICAL HISTORY:  Past Medical History  Diagnosis Date  . 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 Nonspecific elevation of levels of transaminase or lactic acid dehydrogenase (LDH)   . Hypopotassemia   . Diverticulosis   . Hepatic steatosis 02/24/12  . Uterine fibroid   . Nephrolithiasis   . Small bowel obstruction   . 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     liver,mesentery,pelvis  . Cancer     SURGICAL HISTORY: Past Surgical History  Procedure Laterality Date  . Tubal ligation    . Carcinoid tumor resection      ileum    I have reviewed the social history and family history with the patient and they are unchanged from previous note.  ALLERGIES:  is allergic to cephalexin and amoxicillin.  MEDICATIONS:  Current Outpatient Prescriptions  Medication Sig Dispense Refill  . ascorbic acid (VITAMIN C) 500 MG tablet Take 500 mg by mouth daily.    . Calcium Carbonate-Vitamin D (CALCIUM 600-D PO) Take 1 tablet by mouth daily.    . Cholecalciferol (VITAMIN D-3) 5000 UNITS TABS Take 1 tablet by mouth daily.    [name] HYDROcodone-acetaminophen (NORCO/VICODIN) 5-325 MG per tablet Take 1 tablet by mouth every 4 (four) hours as needed. for pain  0  . loperamide (IMODIUM) 2 MG capsule Take by mouth as needed for diarrhea or loose stools.    . Multiple Vitamin (MULTIVITAMIN) tablet Take 1 tablet by mouth daily.    [name] octreotide (SANDOSTATIN LAR) 30 MG injection Inject 30 mg into the muscle every 28 (twenty-eight) days.    . ondansetron (ZOFRAN-ODT) 4 MG disintegrating tablet   0  . potassium chloride SA (K-DUR,KLOR-CON) 20 MEQ tablet Take 1 tablet (20 mEq total) by mouth daily. 90 tablet  2  . tamsulosin (FLOMAX) 0.4 MG CAPS capsule   0   No current facility-administered medications for this visit.    PHYSICAL EXAMINATION: ECOG PERFORMANCE STATUS: 0 - Asymptomatic  There were no vitals filed for this visit. There were no vitals filed for this visit.  GENERAL:alert, no distress and comfortable SKIN: skin color, texture, turgor are normal, no rashes or significant lesions EYES: normal, Conjunctiva are pink and non-injected, sclera clear OROPHARYNX:no exudate, no erythema and lips, buccal mucosa, and tongue normal  NECK: supple, thyroid normal size, non-tender, without nodularity LYMPH:  no palpable lymphadenopathy in the cervical, axillary or inguinal LUNGS: clear to auscultation and percussion with normal breathing effort HEART: regular rate & rhythm and no murmurs and no lower extremity edema ABDOMEN:abdomen soft, non-tender and normal bowel sounds Musculoskeletal:no cyanosis of digits and no clubbing  NEURO: alert & oriented x 3 with fluent speech, no focal motor/sensory deficits  LABORATORY DATA:  I have reviewed the data as listed CBC Latest Ref Rng 08/01/2014 06/14/2014 03/22/2014  WBC 3.9 - 10.3 10e3/uL 5.1 5.0 5.4  Hemoglobin 11.6 - 15.9 g/dL 14.9 14.2 14.5  Hematocrit 34.8 - 46.6 % 45.8 43.6 44.5  Platelets 145 - 400 10e3/uL 228 249 231    CMP Latest Ref Rng  08/01/2014 06/14/2014 03/22/2014  Glucose 70 - 140 mg/dl 109 107 95  BUN 7.0 - 26.0 mg/dL 11.1 9.6 10.4  Creatinine 0.6 - 1.1 mg/dL 0.9 0.9 0.9  Sodium 136 - 145 mEq/L 143 144 142  Potassium 3.5 - 5.1 mEq/L 3.8 3.3(L) 3.6  Chloride 98 - 107 mEq/L - - -  CO2 22 - 29 mEq/L 29 27 26   Calcium 8.4 - 10.4 mg/dL 9.1 10.1 9.4  Total Protein 6.4 - 8.3 g/dL 8.1 8.2 7.9  Albumin 3.3 - 5.5 g/dL - - -  Total Bilirubin 0.20 - 1.20 mg/dL 0.80 0.66 0.71  Alkaline Phos 40 - 150 U/L 86 79 81  AST 5 - 34 U/L 88(H) 70(H) 101(H)  ALT 0 - 55 U/L 94(H) 75(H) 108(H)   Chromogranin A  Status: Finalresult Visible to  patient:  Not Released Nextappt: Today at 09:00 AM in Oncology [facility] Inj Nurse) Dx:  Metastatic carcinoid tumor to intra-a...              Ref Range 7d ago  87mo ago  70mo ago  75mo ago     Chromogranin A <=15 ng/mL 16 (H) 10CM 14CM 3.6R, CM        5 HIAA, quantitative, urine, 24 hour  Status: Finalresult Visible to patient:  Not Released Nextappt: Today at 09:00 AM in Oncology [facility] Inj Nurse) Dx:  Metastatic carcinoid tumor to intra-a...           Ref Range 7d ago (08/01/14) 60mo ago (03/28/14) 50yr ago (06/21/13) 53yr ago (10/19/12)    5-HIAA, 24 Hr Urine <=6.0 mg/24 h 3.6 3.7 4.5 5.5    Volume, Urine-5HIAA mL/24 h 1700 1700 1400 1775         RADIOGRAPHIC STUDIES:  CT chest, abdomen and pelvis IMPRESSION: 1. No signs of metastatic disease to the thorax. 2. Today's study demonstrates stable disease in the abdomen, with multiple hypervascular hepatic metastases, multiple serosal implants on the surface of the liver and multiple peritoneal implants (including the adnexal regions bilaterally). No new sites of disease are otherwise noted in the abdomen or pelvis on today's examination. 3. Previously noted soft tissue lesion in the subcutaneous fat of the of upper left buttock region has resolved, presumably benign. 4. Hepatic steatosis with areas of focal fatty sparing.  ASSESSMENT & PLAN:  1. Metastatic carcinoid tumor of ileum.  --I reviewed her restaging CT with her, which showed SD. She is clinically doing very well -Her serum Chromogranin A and urine 5HIAA are stable  -continue Sandostatin injection q4w.   She will continue follow up with her PCP and other specialists.   Return to my clinic in 2 month, restaging in 4-6 month.   All questions were answered. The patient knows to call the clinic with any problems, questions or concerns. No barriers to learning was detected.  I spent 20 minutes counseling the patient  face to face. The total time spent in the appointment was 30 minutes and more than 50% was on counseling and review of test results     Truitt Merle, MD 08/08/2014  8:30 AM

## 2014-09-05 ENCOUNTER — Ambulatory Visit (HOSPITAL_BASED_OUTPATIENT_CLINIC_OR_DEPARTMENT_OTHER): Payer: 59

## 2014-09-05 DIAGNOSIS — C7B09 Secondary carcinoid tumors of other sites: Secondary | ICD-10-CM

## 2014-09-05 DIAGNOSIS — D3A012 Benign carcinoid tumor of the ileum: Secondary | ICD-10-CM

## 2014-09-05 DIAGNOSIS — C7B02 Secondary carcinoid tumors of liver: Secondary | ICD-10-CM

## 2014-09-05 DIAGNOSIS — C7B04 Secondary carcinoid tumors of peritoneum: Secondary | ICD-10-CM

## 2014-09-05 DIAGNOSIS — C7A012 Malignant carcinoid tumor of the ileum: Secondary | ICD-10-CM

## 2014-09-05 DIAGNOSIS — E340 Carcinoid syndrome: Secondary | ICD-10-CM

## 2014-09-05 MED ORDER — OCTREOTIDE ACETATE 30 MG IM KIT
30.0000 mg | PACK | Freq: Once | INTRAMUSCULAR | Status: AC
  Administered 2014-09-05: 30 mg via INTRAMUSCULAR
  Filled 2014-09-05: qty 1

## 2014-09-19 ENCOUNTER — Telehealth: Payer: Self-pay | Admitting: *Deleted

## 2014-09-19 NOTE — Telephone Encounter (Signed)
SPOKE WITH LENISE. PT. WILL NEED TO CALL BILLING AT [phone]. THIS INFORMATION WAS GIVEN TO PT.

## 2014-09-26 ENCOUNTER — Other Ambulatory Visit (HOSPITAL_BASED_OUTPATIENT_CLINIC_OR_DEPARTMENT_OTHER): Payer: 59

## 2014-09-26 DIAGNOSIS — C7B09 Secondary carcinoid tumors of other sites: Secondary | ICD-10-CM

## 2014-09-26 DIAGNOSIS — C7A012 Malignant carcinoid tumor of the ileum: Secondary | ICD-10-CM

## 2014-09-26 LAB — COMPREHENSIVE METABOLIC PANEL (CC13)
ALK PHOS: 84 U/L (ref 40–150)
ALT: 80 U/L — ABNORMAL HIGH (ref 0–55)
ANION GAP: 11 meq/L (ref 3–11)
AST: 68 U/L — ABNORMAL HIGH (ref 5–34)
Albumin: 4.2 g/dL (ref 3.5–5.0)
BILIRUBIN TOTAL: 0.64 mg/dL (ref 0.20–1.20)
BUN: 12 mg/dL (ref 7.0–26.0)
CO2: 24 meq/L (ref 22–29)
Calcium: 9.6 mg/dL (ref 8.4–10.4)
Chloride: 108 mEq/L (ref 98–109)
Creatinine: 0.8 mg/dL (ref 0.6–1.1)
EGFR: 78 mL/min/{1.73_m2} — AB (ref >90)
Glucose: 99 mg/dl (ref 70–140)
Potassium: 3.7 mEq/L (ref 3.5–5.1)
Sodium: 143 mEq/L (ref 136–145)
Total Protein: 7.9 g/dL (ref 6.4–8.3)

## 2014-09-26 LAB — CBC WITH DIFFERENTIAL/PLATELET
BASO%: 1.1 % (ref 0.0–2.0)
Basophils Absolute: 0.1 10*3/uL (ref 0.0–0.1)
EOS%: 1 % (ref 0.0–7.0)
Eosinophils Absolute: 0.1 10*3/uL (ref 0.0–0.5)
HCT: 46.9 % — ABNORMAL HIGH (ref 34.8–46.6)
HGB: 15.3 g/dL (ref 11.6–15.9)
LYMPH#: 1.9 10*3/uL (ref 0.9–3.3)
LYMPH%: 36 % (ref 14.0–49.7)
MCH: 30.2 pg (ref 25.1–34.0)
MCHC: 32.6 g/dL (ref 31.5–36.0)
MCV: 92.5 fL (ref 79.5–101.0)
MONO#: 0.3 10*3/uL (ref 0.1–0.9)
MONO%: 5.9 % (ref 0.0–14.0)
NEUT#: 2.9 10*3/uL (ref 1.5–6.5)
NEUT%: 56 % (ref 38.4–76.8)
Platelets: 250 10*3/uL (ref 145–400)
RBC: 5.07 10*6/uL (ref 3.70–5.45)
RDW: 12.6 % (ref 11.2–14.5)
WBC: 5.3 10*3/uL (ref 3.9–10.3)

## 2014-09-29 ENCOUNTER — Telehealth: Payer: Self-pay | Admitting: *Deleted

## 2014-09-29 NOTE — Telephone Encounter (Signed)
PT. IS UNABLE TO DO THE 5HIAA 24 HOUR URINE UNTIL 10/02/14. SHOULD SHE KEEP THE APPOINTMENT ON 10/03/14 SINCE THE RESULTS OF THE 5HIAA WILL NOT BE READY. THIS NOTE TO DR.FENG AND THU BRAY,RN.

## 2014-09-29 NOTE — Telephone Encounter (Signed)
OK to keep her appointment on 3/21.   Vanessa Waller

## 2014-09-30 LAB — CHROMOGRANIN A: Chromogranin A: 12 ng/mL (ref <=15)

## 2014-09-30 NOTE — Telephone Encounter (Signed)
Called patient to let her know to keep appointment on 10/03/14 with Dr. Burr Medico.

## 2014-10-03 ENCOUNTER — Ambulatory Visit (HOSPITAL_BASED_OUTPATIENT_CLINIC_OR_DEPARTMENT_OTHER): Payer: 59

## 2014-10-03 ENCOUNTER — Telehealth: Payer: Self-pay | Admitting: Hematology

## 2014-10-03 ENCOUNTER — Ambulatory Visit (HOSPITAL_BASED_OUTPATIENT_CLINIC_OR_DEPARTMENT_OTHER): Payer: 59 | Admitting: Hematology

## 2014-10-03 ENCOUNTER — Other Ambulatory Visit: Payer: 59

## 2014-10-03 ENCOUNTER — Ambulatory Visit: Payer: 59

## 2014-10-03 ENCOUNTER — Encounter: Payer: Self-pay | Admitting: Hematology

## 2014-10-03 VITALS — BP 145/89 | HR 77 | Temp 97.1°F | Resp 18 | Ht 62.0 in | Wt 137.2 lb

## 2014-10-03 DIAGNOSIS — E340 Carcinoid syndrome: Secondary | ICD-10-CM

## 2014-10-03 DIAGNOSIS — C7B09 Secondary carcinoid tumors of other sites: Secondary | ICD-10-CM

## 2014-10-03 DIAGNOSIS — C7B02 Secondary carcinoid tumors of liver: Secondary | ICD-10-CM

## 2014-10-03 DIAGNOSIS — D3A012 Benign carcinoid tumor of the ileum: Secondary | ICD-10-CM

## 2014-10-03 DIAGNOSIS — C7A012 Malignant carcinoid tumor of the ileum: Secondary | ICD-10-CM

## 2014-10-03 DIAGNOSIS — C7B04 Secondary carcinoid tumors of peritoneum: Secondary | ICD-10-CM

## 2014-10-03 MED ORDER — OCTREOTIDE ACETATE 30 MG IM KIT
30.0000 mg | PACK | Freq: Once | INTRAMUSCULAR | Status: AC
  Administered 2014-10-03: 30 mg via INTRAMUSCULAR
  Filled 2014-10-03: qty 1

## 2014-10-03 NOTE — Telephone Encounter (Signed)
gave and printed appt sched and avs for pt for April thru June

## 2014-10-03 NOTE — Progress Notes (Signed)
[place] OFFICE PROGRESS NOTE  Patient Care Team: [name], PA-C as PCP - General (Physician Assistant)  DIAGNOSIS: 1. 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  2. 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 The patient underwent surgical resection of her carcinoid tumor on 08/17/2005. Two out of 13 lymph nodes were positive. The patient's stage was III. The patient was noted to have an elevated urinary 5HIAA level in July 2011. A positive octreotide scan was noted coming from the right lower pelvis adjacent to the bladder on 09/06/2010. Initially this was felt to be a uterine fibroid. The patient has continued to have positive octreotide scan on 11/07/2011 and 05/06/2012. CT scans of the abdomen and pelvis were also carried out on 03/02/2010, 02/24/2012 and 06/30/2012. These scans in conjunction with an elevated 5HIAA level of 22.7 on 04/13/2012 and a chromogranin A level which had risen to 18.0 on 03/23/2012 strongly suggested that the patient's disease had recurred. The patient required admission to the hospital from 06/30/2012 through 07/03/2012 for an episode of upper abdominal pain which occurred suddenly the day prior to admission. The patient was started on somatostatin LAR 30 mg IM on 07/07/2012 for recurrent disease involving the lateral segment of the left hepatic lobe, a right jejunal mesenteric node and 2 enhancing masses adjacent to the uterus. Twenty-four-hour urine 5HIAA on 10/19/2012 was 5.5. CT scan of the abdomen and pelvis with IV contrast on 10/19/2012 showed generally stable disease with the exception of a decrease in the right abdominal mesenteric lymphadenopathy. There have been 2 lesions in the liver which are felt to be stable. One lesion is in the lateral segment of the  left hepatic lobe measuring 14 x 14 mm. The second lesion is seen in the lateral segment adjacent to the falciform ligament and measures 9 mm.  2. Uterine fibroids noted on imaging studies dating back to late April 2010.  3. Elevated liver transaminases noted in March 2007. The patient was evaluated by Dr. Delfin Edis in October 2010 and felt that the etiology was hepatic steatosis.  4. Admission to the hospital for upper abdominal pain with sudden onset. Admission dates were 06/30/2012 through 07/03/2012. CT scan of the abdomen and pelvis with IV contrast on 06/30/2012 showed no definite etiology for the pain. It was noted that a lymph node in the right jejunal mesentery had developed central necrosis when compared with the prior study of 02/24/2012. There was no evidence for bowel obstruction. There has previously been no evidence for gallstones on recent CT scans, and an abdominal ultrasound dating back to 05/08/2009.  5. Possible mass involving the left breast on screening mammogram from 06/18/2012. A diagnostic mammogram and ultrasound of the left breast carried out on 07/17/2012 were normal. Followup screening mammogram was suggested in 1 year.  6. Diarrhea with onset in December 2013, somewhat improved with Imodium.   CURRENT THERAPY:  somatostatin LAR 30 mg IM started on 07/07/2012   INTERVAL HISTORY: She returns for follow up. She is doing well overall. She had no hospitalization and ED visit since last visit 2 months ago. She denies any pain, diarrhea, hot flash, or other symptoms. She has good appetite and weight is stable. She is tolerating somatostatin treatment very well.  REVIEW OF SYSTEMS:   Constitutional: Denies fevers, chills or abnormal  weight loss Eyes: Denies blurriness of vision Ears, nose, mouth, throat, and face: Denies mucositis or sore throat Respiratory: Denies cough, dyspnea or wheezes Cardiovascular: Denies palpitation, chest discomfort or lower extremity  swelling Gastrointestinal:  Denies nausea, heartburn or change in bowel habits Skin: Denies abnormal skin rashes Lymphatics: Denies new lymphadenopathy or easy bruising Neurological:Denies numbness, tingling or new weaknesses Behavioral/Psych: Mood is stable, no new changes  All other systems were reviewed with the patient and are negative.  MEDICAL HISTORY:  Past Medical History  Diagnosis Date  . 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 Nonspecific elevation of levels of transaminase or lactic acid dehydrogenase (LDH)   . Hypopotassemia   . Diverticulosis   . Hepatic steatosis 02/24/12  . Uterine fibroid   . Nephrolithiasis   . Small bowel obstruction   . 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     liver,mesentery,pelvis  . Cancer     SURGICAL HISTORY: Past Surgical History  Procedure Laterality Date  . Tubal ligation    . Carcinoid tumor resection      ileum    I have reviewed the social history and family history with the patient and they are unchanged from previous note.  ALLERGIES:  is allergic to cephalexin and amoxicillin.  MEDICATIONS:  Current Outpatient Prescriptions  Medication Sig Dispense Refill  . ascorbic acid (VITAMIN C) 500 MG tablet Take 500 mg by mouth daily.    . Calcium Carbonate-Vitamin D (CALCIUM 600-D PO) Take 1 tablet by mouth daily.    . Cholecalciferol (VITAMIN D-3) 5000 UNITS TABS Take 1 tablet by mouth daily.    [name] loperamide (IMODIUM) 2 MG capsule Take by mouth as needed for diarrhea or loose stools.    . Multiple Vitamin (MULTIVITAMIN) tablet Take 1 tablet by mouth daily.    [name] octreotide (SANDOSTATIN LAR) 30 MG injection Inject 30 mg into the muscle every 28 (twenty-eight) days.    . potassium chloride SA (K-DUR,KLOR-CON) 20 MEQ tablet Take 1 tablet (20 mEq total) by mouth daily. 90 tablet 2   No current facility-administered medications for this visit.    PHYSICAL EXAMINATION: ECOG PERFORMANCE STATUS: 0 -  Asymptomatic  Filed Vitals:   10/03/14 0815  BP: 145/89  Pulse: 77  Temp: 97.1 F (36.2 C)  Resp: 18   Filed Weights   10/03/14 0815  Weight: 137 lb 3.2 oz (62.234 kg)    GENERAL:alert, no distress and comfortable SKIN: skin color, texture, turgor are normal, no rashes or significant lesions EYES: normal, Conjunctiva are pink and non-injected, sclera clear OROPHARYNX:no exudate, no erythema and lips, buccal mucosa, and tongue normal  NECK: supple, thyroid normal size, non-tender, without nodularity LYMPH:  no palpable lymphadenopathy in the cervical, axillary or inguinal LUNGS: clear to auscultation and percussion with normal breathing effort HEART: regular rate & rhythm and no murmurs and no lower extremity edema ABDOMEN:abdomen soft, non-tender and normal bowel sounds Musculoskeletal:no cyanosis of digits and no clubbing  NEURO: alert & oriented x 3 with fluent speech, no focal motor/sensory deficits  LABORATORY DATA:  I have reviewed the data as listed CBC Latest Ref Rng 09/26/2014 08/01/2014 06/14/2014  WBC 3.9 - 10.3 10e3/uL 5.3 5.1 5.0  Hemoglobin 11.6 - 15.9 g/dL 15.3 14.9 14.2  Hematocrit 34.8 - 46.6 % 46.9(H) 45.8 43.6  Platelets 145 - 400 10e3/uL 250 228 249    CMP Latest Ref Rng 09/26/2014 08/01/2014 06/14/2014  Glucose 70 - 140 mg/dl 99 109 107  BUN 7.0 -  26.0 mg/dL 12.0 11.1 9.6  Creatinine 0.6 - 1.1 mg/dL 0.8 0.9 0.9  Sodium 136 - 145 mEq/L 143 143 144  Potassium 3.5 - 5.1 mEq/L 3.7 3.8 3.3(L)  Chloride 98 - 107 mEq/L - - -  CO2 22 - 29 mEq/L 24 29 27   Calcium 8.4 - 10.4 mg/dL 9.6 9.1 10.1  Total Protein 6.4 - 8.3 g/dL 7.9 8.1 8.2  Albumin 3.3 - 5.5 g/dL - - -  Total Bilirubin 0.20 - 1.20 mg/dL 0.64 0.80 0.66  Alkaline Phos 40 - 150 U/L 84 86 79  AST 5 - 34 U/L 68(H) 88(H) 70(H)  ALT 0 - 55 U/L 80(H) 94(H) 75(H)   Chromogranin A  Status: Finalresult Visible to patient:  Not Released Nextappt: Today at 09:00 AM in Oncology [facility] Inj  Nurse) Dx:  Metastatic carcinoid tumor to intra-a...           Ref Range 7d ago  29mo ago  32mo ago     Chromogranin A <=15 ng/mL 12 16 (H)CM 10CM         5 HIAA, quantitative, urine, 24 hour  Status: Finalresult Visible to patient:  MyChart Nextappt: Today at 09:00 AM in Oncology [facility] Inj Nurse) Dx:  Metastatic carcinoid tumor to intra-a...           Ref Range 10mo ago  37mo ago  78yr ago     5-HIAA, 24 Hr Urine <=6.0 mg/24 h 3.6 3.7 4.5    Volume, Urine-5HIAA mL/24 h 1700 1700 1400            RADIOGRAPHIC STUDIES:  CT chest, abdomen and pelvis 08/01/2014 IMPRESSION: 1. No signs of metastatic disease to the thorax. 2. Today's study demonstrates stable disease in the abdomen, with multiple hypervascular hepatic metastases, multiple serosal implants on the surface of the liver and multiple peritoneal implants (including the adnexal regions bilaterally). No new sites of disease are otherwise noted in the abdomen or pelvis on today's examination. 3. Previously noted soft tissue lesion in the subcutaneous fat of the of upper left buttock region has resolved, presumably benign. 4. Hepatic steatosis with areas of focal fatty sparing.  ASSESSMENT & PLAN:  1. Metastatic carcinoid tumor of ileum.  --Her last scan n 07/2014 showed SD. She is clinically doing very well -Her serum Chromogranin A and urine 5HIAA are stable  -continue Sandostatin injection q4w.   She will continue follow up with her PCP and other specialists.   Return to my clinic in 2 month, restaging in 6 month, mid July of 2016.   All questions were answered. The patient knows to call the clinic with any problems, questions or concerns. No barriers to learning was detected.  I spent 20 minutes counseling the patient face to face. The total time spent in the appointment was 30 minutes and more than 50% was on counseling and review of test results     Truitt Merle,  MD 10/03/2014  8:39 AM

## 2014-10-05 LAB — 5 HIAA, QUANTITATIVE, URINE, 24 HOUR
5-HIAA, 24 Hr Urine: 7.3 mg/24 h — ABNORMAL HIGH (ref <=6.0)
VOLUME, URINE-5HIAA: 1700 mL/(24.h)

## 2014-10-31 ENCOUNTER — Ambulatory Visit (HOSPITAL_BASED_OUTPATIENT_CLINIC_OR_DEPARTMENT_OTHER): Payer: 59

## 2014-10-31 VITALS — BP 146/85 | HR 79 | Temp 98.3°F

## 2014-10-31 DIAGNOSIS — C7B02 Secondary carcinoid tumors of liver: Secondary | ICD-10-CM

## 2014-10-31 DIAGNOSIS — C7A012 Malignant carcinoid tumor of the ileum: Secondary | ICD-10-CM

## 2014-10-31 DIAGNOSIS — D3A012 Benign carcinoid tumor of the ileum: Secondary | ICD-10-CM

## 2014-10-31 DIAGNOSIS — C7B04 Secondary carcinoid tumors of peritoneum: Secondary | ICD-10-CM | POA: Diagnosis not present

## 2014-10-31 DIAGNOSIS — E340 Carcinoid syndrome: Secondary | ICD-10-CM | POA: Diagnosis not present

## 2014-10-31 DIAGNOSIS — C7B09 Secondary carcinoid tumors of other sites: Secondary | ICD-10-CM

## 2014-10-31 MED ORDER — OCTREOTIDE ACETATE 30 MG IM KIT
30.0000 mg | PACK | Freq: Once | INTRAMUSCULAR | Status: AC
  Administered 2014-10-31: 30 mg via INTRAMUSCULAR
  Filled 2014-10-31: qty 1

## 2014-11-21 ENCOUNTER — Other Ambulatory Visit (HOSPITAL_BASED_OUTPATIENT_CLINIC_OR_DEPARTMENT_OTHER): Payer: 59

## 2014-11-21 DIAGNOSIS — C7B04 Secondary carcinoid tumors of peritoneum: Secondary | ICD-10-CM | POA: Diagnosis not present

## 2014-11-21 DIAGNOSIS — C7B02 Secondary carcinoid tumors of liver: Secondary | ICD-10-CM | POA: Diagnosis not present

## 2014-11-21 DIAGNOSIS — C7B09 Secondary carcinoid tumors of other sites: Secondary | ICD-10-CM

## 2014-11-21 DIAGNOSIS — C7A012 Malignant carcinoid tumor of the ileum: Secondary | ICD-10-CM

## 2014-11-21 LAB — COMPREHENSIVE METABOLIC PANEL (CC13)
ALT: 92 U/L — ABNORMAL HIGH (ref 0–55)
ANION GAP: 11 meq/L (ref 3–11)
AST: 85 U/L — ABNORMAL HIGH (ref 5–34)
Albumin: 4.2 g/dL (ref 3.5–5.0)
Alkaline Phosphatase: 89 U/L (ref 40–150)
BUN: 13.1 mg/dL (ref 7.0–26.0)
CALCIUM: 9.3 mg/dL (ref 8.4–10.4)
CHLORIDE: 108 meq/L (ref 98–109)
CO2: 23 meq/L (ref 22–29)
CREATININE: 0.9 mg/dL (ref 0.6–1.1)
EGFR: 74 mL/min/{1.73_m2} — ABNORMAL LOW (ref >90)
Glucose: 90 mg/dl (ref 70–140)
POTASSIUM: 3.9 meq/L (ref 3.5–5.1)
Sodium: 142 mEq/L (ref 136–145)
Total Bilirubin: 0.74 mg/dL (ref 0.20–1.20)
Total Protein: 7.7 g/dL (ref 6.4–8.3)

## 2014-11-21 LAB — CBC WITH DIFFERENTIAL/PLATELET
BASO%: 0.6 % (ref 0.0–2.0)
BASOS ABS: 0 10*3/uL (ref 0.0–0.1)
EOS%: 0.6 % (ref 0.0–7.0)
Eosinophils Absolute: 0 10*3/uL (ref 0.0–0.5)
HEMATOCRIT: 44.7 % (ref 34.8–46.6)
HEMOGLOBIN: 14.5 g/dL (ref 11.6–15.9)
LYMPH#: 1.8 10*3/uL (ref 0.9–3.3)
LYMPH%: 36.6 % (ref 14.0–49.7)
MCH: 30.3 pg (ref 25.1–34.0)
MCHC: 32.4 g/dL (ref 31.5–36.0)
MCV: 93.3 fL (ref 79.5–101.0)
MONO#: 0.2 10*3/uL (ref 0.1–0.9)
MONO%: 4.8 % (ref 0.0–14.0)
NEUT#: 2.8 10*3/uL (ref 1.5–6.5)
NEUT%: 57.4 % (ref 38.4–76.8)
PLATELETS: 227 10*3/uL (ref 145–400)
RBC: 4.79 10*6/uL (ref 3.70–5.45)
RDW: 12.4 % (ref 11.2–14.5)
WBC: 4.8 10*3/uL (ref 3.9–10.3)

## 2014-11-25 LAB — CHROMOGRANIN A: Chromogranin A: 14 ng/mL (ref <=15)

## 2014-11-27 LAB — 5 HIAA, QUANTITATIVE, URINE, 24 HOUR
5-HIAA, URINE: 9 mg/(24.h) — AB (ref <=6.0)
VOLUME, URINE-5HIAA: 2000 mL/(24.h)

## 2014-11-28 ENCOUNTER — Encounter: Payer: Self-pay | Admitting: Hematology

## 2014-11-28 ENCOUNTER — Ambulatory Visit (HOSPITAL_BASED_OUTPATIENT_CLINIC_OR_DEPARTMENT_OTHER): Payer: 59

## 2014-11-28 ENCOUNTER — Ambulatory Visit (HOSPITAL_BASED_OUTPATIENT_CLINIC_OR_DEPARTMENT_OTHER): Payer: 59 | Admitting: Hematology

## 2014-11-28 ENCOUNTER — Other Ambulatory Visit: Payer: 59

## 2014-11-28 ENCOUNTER — Telehealth: Payer: Self-pay | Admitting: Hematology

## 2014-11-28 ENCOUNTER — Ambulatory Visit: Payer: 59

## 2014-11-28 VITALS — BP 150/86 | HR 84 | Temp 98.0°F | Resp 18 | Ht 62.0 in | Wt 136.0 lb

## 2014-11-28 DIAGNOSIS — C7A012 Malignant carcinoid tumor of the ileum: Secondary | ICD-10-CM

## 2014-11-28 DIAGNOSIS — C7B09 Secondary carcinoid tumors of other sites: Secondary | ICD-10-CM

## 2014-11-28 DIAGNOSIS — C7B04 Secondary carcinoid tumors of peritoneum: Secondary | ICD-10-CM

## 2014-11-28 DIAGNOSIS — R748 Abnormal levels of other serum enzymes: Secondary | ICD-10-CM

## 2014-11-28 DIAGNOSIS — C7B02 Secondary carcinoid tumors of liver: Secondary | ICD-10-CM | POA: Diagnosis not present

## 2014-11-28 DIAGNOSIS — E876 Hypokalemia: Secondary | ICD-10-CM | POA: Diagnosis not present

## 2014-11-28 DIAGNOSIS — I10 Essential (primary) hypertension: Secondary | ICD-10-CM

## 2014-11-28 DIAGNOSIS — R197 Diarrhea, unspecified: Secondary | ICD-10-CM

## 2014-11-28 DIAGNOSIS — D3A012 Benign carcinoid tumor of the ileum: Secondary | ICD-10-CM

## 2014-11-28 MED ORDER — OCTREOTIDE ACETATE 30 MG IM KIT
30.0000 mg | PACK | Freq: Once | INTRAMUSCULAR | Status: AC
  Administered 2014-11-28: 30 mg via INTRAMUSCULAR
  Filled 2014-11-28: qty 1

## 2014-11-28 MED ORDER — POTASSIUM CHLORIDE CRYS ER 20 MEQ PO TBCR: 20.0000 meq | EXTENDED_RELEASE_TABLET | Freq: Every day | ORAL | Status: DC

## 2014-11-28 NOTE — Telephone Encounter (Signed)
Pt confirmed labs/ov/inj per 05/16 POF, gave pt AVS and Calendar.... KJ °

## 2014-11-28 NOTE — Progress Notes (Addendum)
[place] OFFICE PROGRESS NOTE  Patient Care Team: [name], PA-C as PCP - General (Physician Assistant)  DIAGNOSIS: 1. 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  2. 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 The patient underwent surgical resection of her carcinoid tumor on 08/17/2005. Two out of 13 lymph nodes were positive. The patient's stage was III. The patient was noted to have an elevated urinary 5HIAA level in July 2011. A positive octreotide scan was noted coming from the right lower pelvis adjacent to the bladder on 09/06/2010. Initially this was felt to be a uterine fibroid. The patient has continued to have positive octreotide scan on 11/07/2011 and 05/06/2012. CT scans of the abdomen and pelvis were also carried out on 03/02/2010, 02/24/2012 and 06/30/2012. These scans in conjunction with an elevated 5HIAA level of 22.7 on 04/13/2012 and a chromogranin A level which had risen to 18.0 on 03/23/2012 strongly suggested that the patient's disease had recurred. The patient required admission to the hospital from 06/30/2012 through 07/03/2012 for an episode of upper abdominal pain which occurred suddenly the day prior to admission. The patient was started on somatostatin LAR 30 mg IM on 07/07/2012 for recurrent disease involving the lateral segment of the left hepatic lobe, a right jejunal mesenteric node and 2 enhancing masses adjacent to the uterus. Twenty-four-hour urine 5HIAA on 10/19/2012 was 5.5. CT scan of the abdomen and pelvis with IV contrast on 10/19/2012 showed generally stable disease with the exception of a decrease in the right abdominal mesenteric lymphadenopathy. There have been 2 lesions in the liver which are felt to be stable. One lesion is in the lateral segment of the  left hepatic lobe measuring 14 x 14 mm. The second lesion is seen in the lateral segment adjacent to the falciform ligament and measures 9 mm.  2. Uterine fibroids noted on imaging studies dating back to late April 2010.  3. Elevated liver transaminases noted in March 2007. The patient was evaluated by Dr. Delfin Edis in October 2010 and felt that the etiology was hepatic steatosis.  4. Admission to the hospital for upper abdominal pain with sudden onset. Admission dates were 06/30/2012 through 07/03/2012. CT scan of the abdomen and pelvis with IV contrast on 06/30/2012 showed no definite etiology for the pain. It was noted that a lymph node in the right jejunal mesentery had developed central necrosis when compared with the prior study of 02/24/2012. There was no evidence for bowel obstruction. There has previously been no evidence for gallstones on recent CT scans, and an abdominal ultrasound dating back to 05/08/2009.  5. Possible mass involving the left breast on screening mammogram from 06/18/2012. A diagnostic mammogram and ultrasound of the left breast carried out on 07/17/2012 were normal. Followup screening mammogram was suggested in 1 year.  6. Diarrhea with onset in December 2013, somewhat improved with Imodium.   CURRENT THERAPY:  Sandostatin LAR 30 mg IM started on 07/07/2012   INTERVAL HISTORY: She returns for follow up. She is doing well overall. She occasional has diarrhea a few times a day, for which she takes imodium and it helps. She otherwise is doing very well, denies hot flash, abdominal pain, or any other symptoms. She has good appetite and energy level, eating well. Weight is stable.  REVIEW OF SYSTEMS:  Constitutional: Denies fevers, chills or abnormal weight loss Eyes: Denies blurriness of vision Ears, nose, mouth, throat, and face: Denies mucositis or sore throat Respiratory: Denies cough, dyspnea or wheezes Cardiovascular: Denies palpitation, chest discomfort or  lower extremity swelling Gastrointestinal:  Denies nausea, heartburn or change in bowel habits Skin: Denies abnormal skin rashes Lymphatics: Denies new lymphadenopathy or easy bruising Neurological:Denies numbness, tingling or new weaknesses Behavioral/Psych: Mood is stable, no new changes  All other systems were reviewed with the patient and are negative.  MEDICAL HISTORY:  Past Medical History  Diagnosis Date  . 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 Nonspecific elevation of levels of transaminase or lactic acid dehydrogenase (LDH)   . Hypopotassemia   . Diverticulosis   . Hepatic steatosis 02/24/12  . Uterine fibroid   . Nephrolithiasis   . Small bowel obstruction   . 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     liver,mesentery,pelvis  . Cancer     SURGICAL HISTORY: Past Surgical History  Procedure Laterality Date  . Tubal ligation    . Carcinoid tumor resection      ileum    I have reviewed the social history and family history with the patient and they are unchanged from previous note.  ALLERGIES:  is allergic to cephalexin and amoxicillin.  MEDICATIONS:  Current Outpatient Prescriptions  Medication Sig Dispense Refill  . ascorbic acid (VITAMIN C) 500 MG tablet Take 500 mg by mouth daily.    . Calcium Carbonate-Vitamin D (CALCIUM 600-D PO) Take 1 tablet by mouth daily.    . Cholecalciferol (VITAMIN D-3) 5000 UNITS TABS Take 1 tablet by mouth daily.    [name] loperamide (IMODIUM) 2 MG capsule Take by mouth as needed for diarrhea or loose stools.    . Multiple Vitamin (MULTIVITAMIN) tablet Take 1 tablet by mouth daily.    [name] octreotide (SANDOSTATIN LAR) 30 MG injection Inject 30 mg into the muscle every 28 (twenty-eight) days.    . potassium chloride SA (K-DUR,KLOR-CON) 20 MEQ tablet Take 1 tablet (20 mEq total) by mouth daily. 90 tablet 1   No current facility-administered medications for this visit.   Facility-Administered Medications Ordered in Other  Visits  Medication Dose Route Frequency Provider Last Rate Last Dose  . octreotide (SANDOSTATIN LAR) IM injection 30 mg  30 mg Intramuscular Once Truitt Merle, MD        PHYSICAL EXAMINATION: ECOG PERFORMANCE STATUS: 0 - Asymptomatic  Filed Vitals:   11/28/14 0819  BP: 150/86  Pulse: 84  Temp: 98 F (36.7 C)  Resp: 18   Filed Weights   11/28/14 0819  Weight: 136 lb (61.689 kg)    GENERAL:alert, no distress and comfortable SKIN: skin color, texture, turgor are normal, no rashes or significant lesions EYES: normal, Conjunctiva are pink and non-injected, sclera clear OROPHARYNX:no exudate, no erythema and lips, buccal mucosa, and tongue normal  NECK: supple, thyroid normal size, non-tender, without nodularity LYMPH:  no palpable lymphadenopathy in the cervical, axillary or inguinal LUNGS: clear to auscultation and percussion with normal breathing effort HEART: regular rate & rhythm and no murmurs and no lower extremity edema ABDOMEN:abdomen soft, non-tender and normal bowel sounds Musculoskeletal:no cyanosis of digits and no clubbing  NEURO: alert & oriented x 3 with fluent speech, no focal motor/sensory deficits  LABORATORY DATA:  I have reviewed the data as listed CBC Latest Ref Rng 11/21/2014 09/26/2014 08/01/2014  WBC 3.9 - 10.3 10e3/uL 4.8 5.3 5.1  Hemoglobin 11.6 - 15.9 g/dL 14.5  15.3 14.9  Hematocrit 34.8 - 46.6 % 44.7 46.9(H) 45.8  Platelets 145 - 400 10e3/uL 227 250 228    CMP Latest Ref Rng 11/21/2014 09/26/2014 08/01/2014  Glucose 70 - 140 mg/dl 90 99 109  BUN 7.0 - 26.0 mg/dL 13.1 12.0 11.1  Creatinine 0.6 - 1.1 mg/dL 0.9 0.8 0.9  Sodium 136 - 145 mEq/L 142 143 143  Potassium 3.5 - 5.1 mEq/L 3.9 3.7 3.8  Chloride 98 - 107 mEq/L - - -  CO2 22 - 29 mEq/L 23 24 29   Calcium 8.4 - 10.4 mg/dL 9.3 9.6 9.1  Total Protein 6.4 - 8.3 g/dL 7.7 7.9 8.1  Albumin 3.3 - 5.5 g/dL - - -  Total Bilirubin 0.20 - 1.20 mg/dL 0.74 0.64 0.80  Alkaline Phos 40 - 150 U/L 89 84 86  AST 5 -  34 U/L 85(H) 68(H) 88(H)  ALT 0 - 55 U/L 92(H) 80(H) 94(H)   Chromogranin A  Status: Finalresult Visible to patient:  Not Released Nextappt: Today at 08:30 AM in Oncology [facility]ico, Krista Blue, MD) Dx:  Metastatic carcinoid tumor to intra-a...           Ref Range 7d ago  31mo ago  8mo ago     Chromogranin A <=15 ng/mL 14 12CM 16 (H)CM   Comments: This test was performed using a laboratory developedelectrochemiluminescent method        5 HIAA, quantitative, urine, 24 hour  Status: Finalresult Visible to patient:  Not Released Nextappt: Today at 08:30 AM in Oncology [facility]ico, Krista Blue, MD) Dx:  Metastatic carcinoid tumor to intra-a...              Ref Range 7d ago  70mo ago  93mo ago     5-HIAA, 24 Hr Urine <=6.0 mg/24 h 9.0 (H) 7.3 (H) 3.6    Volume, Urine-5HIAA mL/24 h 2000 1700 1700           RADIOGRAPHIC STUDIES:  CT chest, abdomen and pelvis 08/01/2014 IMPRESSION: 1. No signs of metastatic disease to the thorax. 2. Today's study demonstrates stable disease in the abdomen, with multiple hypervascular hepatic metastases, multiple serosal implants on the surface of the liver and multiple peritoneal implants (including the adnexal regions bilaterally). No new sites of disease are otherwise noted in the abdomen or pelvis on today's examination. 3. Previously noted soft tissue lesion in the subcutaneous fat of the of upper left buttock region has resolved, presumably benign. 4. Hepatic steatosis with areas of focal fatty sparing.  ASSESSMENT & PLAN:  1. Metastatic carcinoid tumor of ileum.  --Her last scan n 07/2014 showed SD. She is clinically doing very well -Her serum Chromogranin A is normal and stable, urine 5HIAA slightly elevated  -Liver enzymes are mildly elevated, stable overall -continue Sandostatin injection q4w.  -Restaging with CT chest, abdomen and pelvis in 2 months  2. Hypokalemia -Possibly related to her diarrhea -Her  potassium 3.9 today. -Continue potassium chloride 1 tablet daily, I refilled today.  3. HTN -Her blood pressure today is a high, she states it was normal when she followed with her primary care physician. -I encouraged her to monitor her blood pressure home. -Follow-up with primary care physician  She will continue follow up with her PCP and other specialists.   Return to my clinic in 2 month with restaging CT.   All questions were answered. The patient knows to call the clinic with any problems, questions or concerns. No barriers to learning was detected.  I spent  20 minutes counseling the patient face to face. The total time spent in the appointment was 30 minutes and more than 50% was on counseling and review of test results     Truitt Merle, MD 11/28/2014  8:38 AM

## 2014-12-06 ENCOUNTER — Telehealth: Payer: Self-pay | Admitting: Hematology

## 2014-12-06 NOTE — Telephone Encounter (Signed)
pt cld to r/s inj appt on 7/11 to 8:15-pt has updated time & date

## 2014-12-21 ENCOUNTER — Other Ambulatory Visit: Payer: Self-pay | Admitting: Hematology

## 2014-12-21 ENCOUNTER — Telehealth: Payer: Self-pay | Admitting: *Deleted

## 2014-12-21 NOTE — Telephone Encounter (Signed)
Yes, urine test is needed, please let her know.  Truitt Merle

## 2014-12-21 NOTE — Telephone Encounter (Signed)
"  I need to come by and pick up contrast for 01-23-2015 CT scans.  Do I also need to pick up urine container?  Does Dr. Burr Medico want to repeat this test with scans or when?" Will notify provider of this request.   "I will leave work at 4:30 pm.  Faythe Ghee to leave message or call back tomorrow morning."

## 2014-12-21 NOTE — Telephone Encounter (Signed)
Called Vanessa Waller with Dr. Ernestina Penna orders that yes she needs urine test.  "Will collect on June 26th and bring in on June 27th on my way to work.".  The following week is fourth of July and scans are at 7:30 am 01-23-2015 so too early to bring to [facility].

## 2014-12-26 ENCOUNTER — Other Ambulatory Visit: Payer: Self-pay | Admitting: Lab

## 2014-12-26 ENCOUNTER — Ambulatory Visit (HOSPITAL_BASED_OUTPATIENT_CLINIC_OR_DEPARTMENT_OTHER): Payer: 59

## 2014-12-26 VITALS — BP 142/81 | HR 81 | Temp 98.4°F

## 2014-12-26 DIAGNOSIS — C7A012 Malignant carcinoid tumor of the ileum: Secondary | ICD-10-CM | POA: Diagnosis not present

## 2014-12-26 DIAGNOSIS — D3A012 Benign carcinoid tumor of the ileum: Secondary | ICD-10-CM

## 2014-12-26 DIAGNOSIS — C7B02 Secondary carcinoid tumors of liver: Secondary | ICD-10-CM

## 2014-12-26 DIAGNOSIS — C7B04 Secondary carcinoid tumors of peritoneum: Secondary | ICD-10-CM | POA: Diagnosis not present

## 2014-12-26 DIAGNOSIS — C7B09 Secondary carcinoid tumors of other sites: Secondary | ICD-10-CM

## 2014-12-26 MED ORDER — OCTREOTIDE ACETATE 30 MG IM KIT
30.0000 mg | PACK | Freq: Once | INTRAMUSCULAR | Status: AC
  Administered 2014-12-26: 30 mg via INTRAMUSCULAR
  Filled 2014-12-26: qty 1

## 2015-01-13 ENCOUNTER — Telehealth: Payer: Self-pay | Admitting: Hematology

## 2015-01-13 NOTE — Telephone Encounter (Signed)
Due to PAL moved 7/22 f/u to 7/20. Other appointments remain the same. Left message for patient and mailed schedule.

## 2015-01-23 ENCOUNTER — Encounter (HOSPITAL_COMMUNITY): Payer: Self-pay

## 2015-01-23 ENCOUNTER — Ambulatory Visit (HOSPITAL_COMMUNITY)
Admission: RE | Admit: 2015-01-23 | Discharge: 2015-01-23 | Disposition: A | Discharge status: Home / Self Care | Payer: 59 | Source: Ambulatory Visit | Attending: Hematology | Admitting: Hematology

## 2015-01-23 ENCOUNTER — Ambulatory Visit: Payer: 59

## 2015-01-23 ENCOUNTER — Ambulatory Visit (HOSPITAL_BASED_OUTPATIENT_CLINIC_OR_DEPARTMENT_OTHER): Payer: 59

## 2015-01-23 VITALS — BP 146/87 | HR 83 | Temp 98.6°F

## 2015-01-23 DIAGNOSIS — C7A012 Malignant carcinoid tumor of the ileum: Secondary | ICD-10-CM | POA: Diagnosis not present

## 2015-01-23 DIAGNOSIS — K760 Fatty (change of) liver, not elsewhere classified: Secondary | ICD-10-CM | POA: Diagnosis not present

## 2015-01-23 DIAGNOSIS — C7B02 Secondary carcinoid tumors of liver: Secondary | ICD-10-CM | POA: Diagnosis not present

## 2015-01-23 DIAGNOSIS — C786 Secondary malignant neoplasm of retroperitoneum and peritoneum: Secondary | ICD-10-CM | POA: Insufficient documentation

## 2015-01-23 DIAGNOSIS — C7B04 Secondary carcinoid tumors of peritoneum: Secondary | ICD-10-CM

## 2015-01-23 DIAGNOSIS — D3A012 Benign carcinoid tumor of the ileum: Secondary | ICD-10-CM

## 2015-01-23 DIAGNOSIS — C7B09 Secondary carcinoid tumors of other sites: Secondary | ICD-10-CM

## 2015-01-23 MED ORDER — OCTREOTIDE ACETATE 30 MG IM KIT
30.0000 mg | PACK | Freq: Once | INTRAMUSCULAR | Status: AC
  Administered 2015-01-23: 30 mg via INTRAMUSCULAR
  Filled 2015-01-23: qty 1

## 2015-01-23 MED ORDER — IOHEXOL 300 MG/ML  SOLN
100.0000 mL | Freq: Once | INTRAMUSCULAR | Status: AC | PRN
  Administered 2015-01-23: 100 mL via INTRAVENOUS

## 2015-01-27 LAB — 5 HIAA, QUANTITATIVE, URINE, 24 HOUR
5-HIAA, 24 Hr Urine: 2.6 mg/24 h (ref <=6.0)
Volume, Urine-5HIAA: 2200 mL/24 h

## 2015-01-30 ENCOUNTER — Other Ambulatory Visit: Payer: 59

## 2015-01-30 ENCOUNTER — Telehealth: Payer: Self-pay

## 2015-01-30 NOTE — Telephone Encounter (Signed)
Pt missed labs this am d/t sickness. She is calling to r/s labs or labs and MD. Called pt back, she was having diarrhea in the night several times. Scheduled pt for labs at 33 Wed am before MD visit at 845. Will make MD aware

## 2015-01-31 ENCOUNTER — Encounter: Payer: Self-pay | Admitting: Hematology

## 2015-02-01 ENCOUNTER — Telehealth: Payer: Self-pay | Admitting: Hematology

## 2015-02-01 ENCOUNTER — Other Ambulatory Visit (HOSPITAL_BASED_OUTPATIENT_CLINIC_OR_DEPARTMENT_OTHER): Payer: 59

## 2015-02-01 ENCOUNTER — Encounter: Payer: Self-pay | Admitting: Hematology

## 2015-02-01 ENCOUNTER — Ambulatory Visit (HOSPITAL_BASED_OUTPATIENT_CLINIC_OR_DEPARTMENT_OTHER): Payer: 59 | Admitting: Hematology

## 2015-02-01 VITALS — BP 142/87 | HR 80 | Temp 98.6°F | Resp 18 | Ht 62.0 in | Wt 136.3 lb

## 2015-02-01 DIAGNOSIS — E876 Hypokalemia: Secondary | ICD-10-CM | POA: Diagnosis not present

## 2015-02-01 DIAGNOSIS — C7B04 Secondary carcinoid tumors of peritoneum: Secondary | ICD-10-CM | POA: Diagnosis not present

## 2015-02-01 DIAGNOSIS — I10 Essential (primary) hypertension: Secondary | ICD-10-CM

## 2015-02-01 DIAGNOSIS — C7B02 Secondary carcinoid tumors of liver: Secondary | ICD-10-CM

## 2015-02-01 DIAGNOSIS — C7A012 Malignant carcinoid tumor of the ileum: Secondary | ICD-10-CM

## 2015-02-01 DIAGNOSIS — C7B09 Secondary carcinoid tumors of other sites: Secondary | ICD-10-CM

## 2015-02-01 DIAGNOSIS — R197 Diarrhea, unspecified: Secondary | ICD-10-CM

## 2015-02-01 LAB — COMPREHENSIVE METABOLIC PANEL (CC13)
ALBUMIN: 4.4 g/dL (ref 3.5–5.0)
ALK PHOS: 90 U/L (ref 40–150)
ALT: 57 U/L — ABNORMAL HIGH (ref 0–55)
AST: 61 U/L — ABNORMAL HIGH (ref 5–34)
Anion Gap: 11 mEq/L (ref 3–11)
BUN: 10.1 mg/dL (ref 7.0–26.0)
CALCIUM: 10.2 mg/dL (ref 8.4–10.4)
CO2: 28 meq/L (ref 22–29)
CREATININE: 0.9 mg/dL (ref 0.6–1.1)
Chloride: 103 mEq/L (ref 98–109)
EGFR: 75 mL/min/{1.73_m2} — ABNORMAL LOW (ref >90)
Glucose: 101 mg/dl (ref 70–140)
Potassium: 4.3 mEq/L (ref 3.5–5.1)
Sodium: 141 mEq/L (ref 136–145)
TOTAL PROTEIN: 8.5 g/dL — AB (ref 6.4–8.3)
Total Bilirubin: 0.9 mg/dL (ref 0.20–1.20)

## 2015-02-01 LAB — CBC WITH DIFFERENTIAL/PLATELET
BASO%: 0.5 % (ref 0.0–2.0)
Basophils Absolute: 0 10*3/uL (ref 0.0–0.1)
EOS%: 0.5 % (ref 0.0–7.0)
Eosinophils Absolute: 0 10*3/uL (ref 0.0–0.5)
HCT: 45.7 % (ref 34.8–46.6)
HGB: 15 g/dL (ref 11.6–15.9)
LYMPH%: 33.5 % (ref 14.0–49.7)
MCH: 30.7 pg (ref 25.1–34.0)
MCHC: 32.8 g/dL (ref 31.5–36.0)
MCV: 93.5 fL (ref 79.5–101.0)
MONO#: 0.4 10*3/uL (ref 0.1–0.9)
MONO%: 6 % (ref 0.0–14.0)
NEUT%: 59.5 % (ref 38.4–76.8)
NEUTROS ABS: 3.7 10*3/uL (ref 1.5–6.5)
Platelets: 278 10*3/uL (ref 145–400)
RBC: 4.89 10*6/uL (ref 3.70–5.45)
RDW: 12.3 % (ref 11.2–14.5)
WBC: 6.2 10*3/uL (ref 3.9–10.3)
lymph#: 2.1 10*3/uL (ref 0.9–3.3)

## 2015-02-01 NOTE — Telephone Encounter (Signed)
Gave patient avs report and appointments for August thru October  °
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
## 2015-02-01 NOTE — Telephone Encounter (Signed)
Confirmed with desk nurse labs should be every 2 mos. Appointments adjusted and additional lab/inj added for 10/31. Patient aware and was given new schedule.

## 2015-02-03 ENCOUNTER — Ambulatory Visit: Payer: 59 | Admitting: Hematology

## 2015-02-03 ENCOUNTER — Telehealth: Payer: Self-pay | Admitting: *Deleted

## 2015-02-03 NOTE — Telephone Encounter (Signed)
Notified pt to decrease K+ to every other day per Dr Ernestina Penna instructions.

## 2015-02-09 LAB — CHROMOGRANIN A: Chromogranin A: 17 ng/mL — ABNORMAL HIGH (ref <=15)

## 2015-02-20 ENCOUNTER — Other Ambulatory Visit: Payer: 59

## 2015-02-20 ENCOUNTER — Ambulatory Visit (HOSPITAL_BASED_OUTPATIENT_CLINIC_OR_DEPARTMENT_OTHER): Payer: 59

## 2015-02-20 VITALS — BP 136/75 | HR 84 | Temp 98.4°F

## 2015-02-20 DIAGNOSIS — C7B04 Secondary carcinoid tumors of peritoneum: Secondary | ICD-10-CM

## 2015-02-20 DIAGNOSIS — C7A012 Malignant carcinoid tumor of the ileum: Secondary | ICD-10-CM | POA: Diagnosis not present

## 2015-02-20 DIAGNOSIS — C7B02 Secondary carcinoid tumors of liver: Secondary | ICD-10-CM | POA: Diagnosis not present

## 2015-02-20 DIAGNOSIS — C7B09 Secondary carcinoid tumors of other sites: Secondary | ICD-10-CM

## 2015-02-20 DIAGNOSIS — D3A012 Benign carcinoid tumor of the ileum: Secondary | ICD-10-CM

## 2015-02-20 MED ORDER — OCTREOTIDE ACETATE 30 MG IM KIT
30.0000 mg | PACK | Freq: Once | INTRAMUSCULAR | Status: AC
  Administered 2015-02-20: 30 mg via INTRAMUSCULAR
  Filled 2015-02-20: qty 1

## 2015-03-16 LAB — 5 HIAA, QUANTITATIVE, URINE, 24 HOUR
5-HIAA, URINE: 5.7 mg/(24.h) (ref <=6.0)
VOLUME, URINE-5HIAA: 1400 mL/(24.h)

## 2015-03-21 ENCOUNTER — Ambulatory Visit (HOSPITAL_BASED_OUTPATIENT_CLINIC_OR_DEPARTMENT_OTHER): Payer: 59

## 2015-03-21 ENCOUNTER — Other Ambulatory Visit (HOSPITAL_BASED_OUTPATIENT_CLINIC_OR_DEPARTMENT_OTHER): Payer: 59

## 2015-03-21 VITALS — BP 146/82 | HR 81 | Temp 98.5°F

## 2015-03-21 DIAGNOSIS — C7A012 Malignant carcinoid tumor of the ileum: Secondary | ICD-10-CM

## 2015-03-21 DIAGNOSIS — C7B02 Secondary carcinoid tumors of liver: Secondary | ICD-10-CM | POA: Diagnosis not present

## 2015-03-21 DIAGNOSIS — D3A012 Benign carcinoid tumor of the ileum: Secondary | ICD-10-CM

## 2015-03-21 DIAGNOSIS — C7B04 Secondary carcinoid tumors of peritoneum: Secondary | ICD-10-CM | POA: Diagnosis not present

## 2015-03-21 DIAGNOSIS — C7B09 Secondary carcinoid tumors of other sites: Secondary | ICD-10-CM

## 2015-03-21 LAB — COMPREHENSIVE METABOLIC PANEL (CC13)
ALT: 77 U/L — AB (ref 0–55)
AST: 66 U/L — AB (ref 5–34)
Albumin: 4.3 g/dL (ref 3.5–5.0)
Alkaline Phosphatase: 80 U/L (ref 40–150)
Anion Gap: 8 mEq/L (ref 3–11)
BUN: 11.6 mg/dL (ref 7.0–26.0)
CALCIUM: 10.2 mg/dL (ref 8.4–10.4)
CO2: 30 meq/L — AB (ref 22–29)
Chloride: 105 mEq/L (ref 98–109)
Creatinine: 0.9 mg/dL (ref 0.6–1.1)
EGFR: 69 mL/min/{1.73_m2} — ABNORMAL LOW (ref >90)
Glucose: 77 mg/dl (ref 70–140)
POTASSIUM: 3.9 meq/L (ref 3.5–5.1)
SODIUM: 144 meq/L (ref 136–145)
Total Bilirubin: 0.79 mg/dL (ref 0.20–1.20)
Total Protein: 8.1 g/dL (ref 6.4–8.3)

## 2015-03-21 LAB — CBC WITH DIFFERENTIAL/PLATELET
BASO%: 0.8 % (ref 0.0–2.0)
Basophils Absolute: 0 10*3/uL (ref 0.0–0.1)
EOS%: 0.8 % (ref 0.0–7.0)
Eosinophils Absolute: 0 10*3/uL (ref 0.0–0.5)
HEMATOCRIT: 44.8 % (ref 34.8–46.6)
HGB: 15.1 g/dL (ref 11.6–15.9)
LYMPH%: 38.8 % (ref 14.0–49.7)
MCH: 30.7 pg (ref 25.1–34.0)
MCHC: 33.6 g/dL (ref 31.5–36.0)
MCV: 91.4 fL (ref 79.5–101.0)
MONO#: 0.4 10*3/uL (ref 0.1–0.9)
MONO%: 7.2 % (ref 0.0–14.0)
NEUT#: 2.6 10*3/uL (ref 1.5–6.5)
NEUT%: 52.4 % (ref 38.4–76.8)
Platelets: 244 10*3/uL (ref 145–400)
RBC: 4.9 10*6/uL (ref 3.70–5.45)
RDW: 12.7 % (ref 11.2–14.5)
WBC: 4.9 10*3/uL (ref 3.9–10.3)
lymph#: 1.9 10*3/uL (ref 0.9–3.3)

## 2015-03-21 MED ORDER — OCTREOTIDE ACETATE 30 MG IM KIT
30.0000 mg | PACK | Freq: Once | INTRAMUSCULAR | Status: AC
  Administered 2015-03-21: 30 mg via INTRAMUSCULAR
  Filled 2015-03-21: qty 1

## 2015-03-24 LAB — CHROMOGRANIN A: Chromogranin A: 14 ng/mL (ref <=15)

## 2015-04-17 ENCOUNTER — Other Ambulatory Visit: Payer: 59

## 2015-04-17 ENCOUNTER — Ambulatory Visit (HOSPITAL_BASED_OUTPATIENT_CLINIC_OR_DEPARTMENT_OTHER): Payer: 59

## 2015-04-17 ENCOUNTER — Ambulatory Visit (HOSPITAL_BASED_OUTPATIENT_CLINIC_OR_DEPARTMENT_OTHER): Payer: 59 | Admitting: Hematology

## 2015-04-17 ENCOUNTER — Encounter: Payer: Self-pay | Admitting: Hematology

## 2015-04-17 ENCOUNTER — Telehealth: Payer: Self-pay | Admitting: Hematology

## 2015-04-17 VITALS — BP 152/80 | HR 84 | Temp 98.4°F | Resp 18 | Ht 62.0 in | Wt 136.0 lb

## 2015-04-17 DIAGNOSIS — C7A012 Malignant carcinoid tumor of the ileum: Secondary | ICD-10-CM

## 2015-04-17 DIAGNOSIS — E876 Hypokalemia: Secondary | ICD-10-CM

## 2015-04-17 DIAGNOSIS — C7B04 Secondary carcinoid tumors of peritoneum: Secondary | ICD-10-CM

## 2015-04-17 DIAGNOSIS — C7B09 Secondary carcinoid tumors of other sites: Secondary | ICD-10-CM

## 2015-04-17 DIAGNOSIS — C7B02 Secondary carcinoid tumors of liver: Secondary | ICD-10-CM | POA: Diagnosis not present

## 2015-04-17 DIAGNOSIS — D3A012 Benign carcinoid tumor of the ileum: Secondary | ICD-10-CM

## 2015-04-17 DIAGNOSIS — I10 Essential (primary) hypertension: Secondary | ICD-10-CM

## 2015-04-17 MED ORDER — OCTREOTIDE ACETATE 30 MG IM KIT
30.0000 mg | PACK | Freq: Once | INTRAMUSCULAR | Status: AC
  Administered 2015-04-17: 30 mg via INTRAMUSCULAR
  Filled 2015-04-17: qty 1

## 2015-04-17 NOTE — Telephone Encounter (Signed)
per pof tos ch pt appt-gave pt copy of avs/contrast-sent Dr Burr Medico email to chge date on scan order to 12/16

## 2015-04-17 NOTE — Addendum Note (Signed)
Addended by: Truitt Merle on: 04/17/2015 01:10 PM   Modules accepted: Orders

## 2015-04-17 NOTE — Patient Instructions (Signed)

## 2015-04-17 NOTE — Progress Notes (Signed)
[place] OFFICE PROGRESS NOTE  Patient Care Team: [name], PA-C as PCP - General (Physician Assistant)  DIAGNOSIS: 1. 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  2. 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 The patient underwent surgical resection of her carcinoid tumor on 08/17/2005. Two out of 13 lymph nodes were positive. The patient's stage was III. The patient was noted to have an elevated urinary 5HIAA level in July 2011. A positive octreotide scan was noted coming from the right lower pelvis adjacent to the bladder on 09/06/2010. Initially this was felt to be a uterine fibroid. The patient has continued to have positive octreotide scan on 11/07/2011 and 05/06/2012. CT scans of the abdomen and pelvis were also carried out on 03/02/2010, 02/24/2012 and 06/30/2012. These scans in conjunction with an elevated 5HIAA level of 22.7 on 04/13/2012 and a chromogranin A level which had risen to 18.0 on 03/23/2012 strongly suggested that the patient's disease had recurred. The patient required admission to the hospital from 06/30/2012 through 07/03/2012 for an episode of upper abdominal pain which occurred suddenly the day prior to admission. The patient was started on somatostatin LAR 30 mg IM on 07/07/2012 for recurrent disease involving the lateral segment of the left hepatic lobe, a right jejunal mesenteric node and 2 enhancing masses adjacent to the uterus. Twenty-four-hour urine 5HIAA on 10/19/2012 was 5.5. CT scan of the abdomen and pelvis with IV contrast on 10/19/2012 showed generally stable disease with the exception of a decrease in the right abdominal mesenteric lymphadenopathy. There have been 2 lesions in the liver which are felt to be stable. One lesion is in the lateral segment of the  left hepatic lobe measuring 14 x 14 mm. The second lesion is seen in the lateral segment adjacent to the falciform ligament and measures 9 mm.  2. Uterine fibroids noted on imaging studies dating back to late April 2010.  3. Elevated liver transaminases noted in March 2007. The patient was evaluated by Dr. Delfin Edis in October 2010 and felt that the etiology was hepatic steatosis.  4. Admission to the hospital for upper abdominal pain with sudden onset. Admission dates were 06/30/2012 through 07/03/2012. CT scan of the abdomen and pelvis with IV contrast on 06/30/2012 showed no definite etiology for the pain. It was noted that a lymph node in the right jejunal mesentery had developed central necrosis when compared with the prior study of 02/24/2012. There was no evidence for bowel obstruction. There has previously been no evidence for gallstones on recent CT scans, and an abdominal ultrasound dating back to 05/08/2009.  5. Possible mass involving the left breast on screening mammogram from 06/18/2012. A diagnostic mammogram and ultrasound of the left breast carried out on 07/17/2012 were normal. Followup screening mammogram was suggested in 1 year.  6. Diarrhea with onset in December 2013, somewhat improved with Imodium.   CURRENT THERAPY:  Sandostatin LAR 30 mg IM started on 07/07/2012   INTERVAL HISTORY: She returns for follow up. She is doing well overall, occasionally has diarrhea, she watches her diet. She denies any abdominal pain, nausea, flushing, dyspnea or other symptoms. She has good appetite and energy level. She feels well overall. She had a flu shot last week.   REVIEW OF SYSTEMS:   Constitutional: Denies fevers, chills or abnormal weight  loss Eyes: Denies blurriness of vision Ears, nose, mouth, throat, and face: Denies mucositis or sore throat Respiratory: Denies cough, dyspnea or wheezes Cardiovascular: Denies palpitation, chest discomfort or lower extremity  swelling Gastrointestinal:  Denies nausea, heartburn or change in bowel habits Skin: Denies abnormal skin rashes Lymphatics: Denies new lymphadenopathy or easy bruising Neurological:Denies numbness, tingling or new weaknesses Behavioral/Psych: Mood is stable, no new changes  All other systems were reviewed with the patient and are negative.  MEDICAL HISTORY:  Past Medical History  Diagnosis Date  . 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 Nonspecific elevation of levels of transaminase or lactic acid dehydrogenase (LDH)   . Hypopotassemia   . Diverticulosis   . Hepatic steatosis 02/24/12  . Uterine fibroid   . Nephrolithiasis   . Small bowel obstruction ([place])   . 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 ([place])     liver,mesentery,pelvis  . carcinoid tumor dx'd 08/2005    SURGICAL HISTORY: Past Surgical History  Procedure Laterality Date  . Tubal ligation    . Carcinoid tumor resection      ileum    I have reviewed the social history and family history with the patient and they are unchanged from previous note.  ALLERGIES:  is allergic to cephalexin and amoxicillin.  MEDICATIONS:  Current Outpatient Prescriptions  Medication Sig Dispense Refill  . ascorbic acid (VITAMIN C) 500 MG tablet Take 500 mg by mouth daily.    . Calcium Carbonate-Vitamin D (CALCIUM 600-D PO) Take 1 tablet by mouth daily.    . Cholecalciferol (VITAMIN D-3) 5000 UNITS TABS Take 1 tablet by mouth daily.    [name] loperamide (IMODIUM) 2 MG capsule Take by mouth as needed for diarrhea or loose stools.    . Multiple Vitamin (MULTIVITAMIN) tablet Take 1 tablet by mouth daily.    [name] octreotide (SANDOSTATIN LAR) 30 MG injection Inject 30 mg into the muscle every 28 (twenty-eight) days.    . potassium chloride SA (K-DUR,KLOR-CON) 20 MEQ tablet Take 1 tablet (20 mEq total) by mouth daily. 90 tablet 1   No current facility-administered medications for this visit.    PHYSICAL EXAMINATION: ECOG  PERFORMANCE STATUS: 0 - Asymptomatic  Filed Vitals:   04/17/15 0852  BP: 152/80  Pulse: 84  Temp: 98.4 F (36.9 C)  Resp: 18   Filed Weights   04/17/15 0852  Weight: 136 lb (61.689 kg)    GENERAL:alert, no distress and comfortable SKIN: skin color, texture, turgor are normal, no rashes or significant lesions EYES: normal, Conjunctiva are pink and non-injected, sclera clear OROPHARYNX:no exudate, no erythema and lips, buccal mucosa, and tongue normal  NECK: supple, thyroid normal size, non-tender, without nodularity LYMPH:  no palpable lymphadenopathy in the cervical, axillary or inguinal LUNGS: clear to auscultation and percussion with normal breathing effort HEART: regular rate & rhythm and no murmurs and no lower extremity edema ABDOMEN:abdomen soft, non-tender and normal bowel sounds Musculoskeletal:no cyanosis of digits and no clubbing  NEURO: alert & oriented x 3 with fluent speech, no focal motor/sensory deficits  LABORATORY DATA:  I have reviewed the data as listed CBC Latest Ref Rng 03/21/2015 02/01/2015 11/21/2014  WBC 3.9 - 10.3 10e3/uL 4.9 6.2 4.8  Hemoglobin 11.6 - 15.9 g/dL 15.1 15.0 14.5  Hematocrit 34.8 - 46.6 % 44.8 45.7 44.7  Platelets 145 - 400 10e3/uL 244 278 227    CMP Latest Ref Rng 03/21/2015 02/01/2015 11/21/2014  Glucose 70 - 140 mg/dl 77 101 90  BUN 7.0 -  26.0 mg/dL 11.6 10.1 13.1  Creatinine 0.6 - 1.1 mg/dL 0.9 0.9 0.9  Sodium 136 - 145 mEq/L 144 141 142  Potassium 3.5 - 5.1 mEq/L 3.9 4.3 3.9  Chloride 98 - 107 mEq/L - - -  CO2 22 - 29 mEq/L 30(H) 28 23  Calcium 8.4 - 10.4 mg/dL 10.2 10.2 9.3  Total Protein 6.4 - 8.3 g/dL 8.1 8.5(H) 7.7  Albumin 3.3 - 5.5 g/dL - - -  Total Bilirubin 0.20 - 1.20 mg/dL 0.79 0.90 0.74  Alkaline Phos 40 - 150 U/L 80 90 89  AST 5 - 34 U/L 66(H) 61(H) 85(H)  ALT 0 - 55 U/L 77(H) 57(H) 92(H)   Chromogranin A  Status: Finalresult Visible to patient:  MyChart Nextappt: 05/15/2015 at 08:00 AM in Oncology  [facility]) Dx:  Metastatic carcinoid tumor to intra-a...           Ref Range 3wk ago  25mo ago  33mo ago     Chromogranin A <=15 ng/mL 14 17 (H)CM 14CM          5 HIAA, quantitative, urine, 24 hour  Status: Finalresult Visible to patient:  MyChart Nextappt: Today at 08:30 AM in Oncology [facility] Medico, Krista Blue, MD) Dx:  Metastatic carcinoid tumor to intra-a.[name].             5 HIAA, quantitative, urine, 24 hour  Status: Finalresult Visible to patient:  MyChart Nextappt: Today at 08:30 AM in Oncology Burr Medico, Krista Blue, MD) Dx:  Metastatic carcinoid tumor to intra-a...           Ref Range 76mo ago  22mo ago  37mo ago  15mo ago     5-HIAA, 24 Hr Urine <=6.0 mg/24 h 5.7 2.6 9.0 (H) 7.3 (H)    Volume, Urine-5HIAA mL/24 h 1400 2200 2000 1700           RADIOGRAPHIC STUDIES:  CT chest, abdomen and pelvis 01/23/2015 IMPRESSION: 1. Stable hepatic and abdominal/pelvic peritoneal metastatic disease. 2. Hepatic steatosis.IMPRESSION: 1. Stable hepatic and abdominal/pelvic peritoneal metastatic disease. 2. Hepatic steatosis.   ASSESSMENT & PLAN:  1. Metastatic carcinoid tumor of ileum.  -We reviewed the natural history of metastatic carcinoid tumor. She understands this is an incurable disease, and the goal of therapy is disease control and prolong her life. -We also reviewed the overall treatment strategy for metastatic carcinoid tumor, she understands her disease will probably progress at some point, and we'll move on to second or third line treatment, such as underlying Everolimus or chemotherapy. -We also discussed the role of liver targeted therapy, I asked her question about the role of surgery or palliative radiation.  -She has been doing very well clinically, chromogranin A and urine 5 HIAA level are most normal. -Liver enzymes are mildly elevated, stable overall -continue Sandostatin injection q4w.  -Repeat lab every 2  months -Restaging with CT chest, abdomen and pelvis in 3 months  2. Hypokalemia -Possibly related to her diarrhea -Her potassium 3.9 4 weeks ago. -She is on potassium 1 tablet daily.  3. HTN -controlled  -I encouraged her to monitor her blood pressure home. -Follow-up with primary care physician  She will continue follow up with her PCP and other specialists.   Follow-up: Continue Sandostatin every 4 weeks. Return to my clinic in 3 month, lab every 2 month.   All questions were answered. The patient knows to call the clinic with any problems, questions or concerns. No barriers to learning was detected.  I spent 20 minutes  counseling the patient face to face. The total time spent in the appointment was 30 minutes and more than 50% was on counseling and review of test results     Truitt Merle, MD 04/17/2015  9:15 AM

## 2015-04-25 ENCOUNTER — Telehealth: Payer: Self-pay | Admitting: Hematology

## 2015-04-25 NOTE — Telephone Encounter (Signed)
Patient called to move 10/31 lab to 11/7 but leave inj as scheduled for 10/31. Per patient labs not associated with injection. Lab moved to 11/7.

## 2015-05-15 ENCOUNTER — Other Ambulatory Visit: Payer: 59

## 2015-05-15 ENCOUNTER — Ambulatory Visit (HOSPITAL_BASED_OUTPATIENT_CLINIC_OR_DEPARTMENT_OTHER): Payer: 59

## 2015-05-15 VITALS — BP 138/85 | HR 83 | Temp 98.9°F

## 2015-05-15 DIAGNOSIS — C7A012 Malignant carcinoid tumor of the ileum: Secondary | ICD-10-CM

## 2015-05-15 DIAGNOSIS — C7B04 Secondary carcinoid tumors of peritoneum: Secondary | ICD-10-CM | POA: Diagnosis not present

## 2015-05-15 DIAGNOSIS — D3A012 Benign carcinoid tumor of the ileum: Secondary | ICD-10-CM

## 2015-05-15 DIAGNOSIS — C7B02 Secondary carcinoid tumors of liver: Secondary | ICD-10-CM

## 2015-05-15 DIAGNOSIS — C7B09 Secondary carcinoid tumors of other sites: Secondary | ICD-10-CM

## 2015-05-15 MED ORDER — OCTREOTIDE ACETATE 30 MG IM KIT
30.0000 mg | PACK | Freq: Once | INTRAMUSCULAR | Status: AC
  Administered 2015-05-15: 30 mg via INTRAMUSCULAR
  Filled 2015-05-15: qty 1

## 2015-05-22 ENCOUNTER — Other Ambulatory Visit (HOSPITAL_BASED_OUTPATIENT_CLINIC_OR_DEPARTMENT_OTHER): Payer: 59

## 2015-05-22 DIAGNOSIS — C7B04 Secondary carcinoid tumors of peritoneum: Secondary | ICD-10-CM | POA: Diagnosis not present

## 2015-05-22 DIAGNOSIS — C7B09 Secondary carcinoid tumors of other sites: Secondary | ICD-10-CM

## 2015-05-22 DIAGNOSIS — C7B02 Secondary carcinoid tumors of liver: Secondary | ICD-10-CM

## 2015-05-22 DIAGNOSIS — C7A012 Malignant carcinoid tumor of the ileum: Secondary | ICD-10-CM

## 2015-05-22 LAB — COMPREHENSIVE METABOLIC PANEL (CC13)
ALBUMIN: 4.2 g/dL (ref 3.5–5.0)
ALK PHOS: 78 U/L (ref 40–150)
ALT: 70 U/L — AB (ref 0–55)
AST: 57 U/L — AB (ref 5–34)
Anion Gap: 9 mEq/L (ref 3–11)
BUN: 11.4 mg/dL (ref 7.0–26.0)
CALCIUM: 10 mg/dL (ref 8.4–10.4)
CO2: 28 mEq/L (ref 22–29)
CREATININE: 0.8 mg/dL (ref 0.6–1.1)
Chloride: 106 mEq/L (ref 98–109)
EGFR: 79 mL/min/{1.73_m2} — ABNORMAL LOW (ref >90)
Glucose: 101 mg/dl (ref 70–140)
Potassium: 3.6 mEq/L (ref 3.5–5.1)
SODIUM: 143 meq/L (ref 136–145)
TOTAL PROTEIN: 8 g/dL (ref 6.4–8.3)
Total Bilirubin: 0.7 mg/dL (ref 0.20–1.20)

## 2015-05-22 LAB — CBC WITH DIFFERENTIAL/PLATELET
BASO%: 1.3 % (ref 0.0–2.0)
Basophils Absolute: 0.1 10*3/uL (ref 0.0–0.1)
EOS%: 0.7 % (ref 0.0–7.0)
Eosinophils Absolute: 0 10*3/uL (ref 0.0–0.5)
HEMATOCRIT: 45.7 % (ref 34.8–46.6)
HEMOGLOBIN: 15 g/dL (ref 11.6–15.9)
LYMPH#: 1.9 10*3/uL (ref 0.9–3.3)
LYMPH%: 38.3 % (ref 14.0–49.7)
MCH: 30.1 pg (ref 25.1–34.0)
MCHC: 32.9 g/dL (ref 31.5–36.0)
MCV: 91.3 fL (ref 79.5–101.0)
MONO#: 0.3 10*3/uL (ref 0.1–0.9)
MONO%: 5.8 % (ref 0.0–14.0)
NEUT%: 53.9 % (ref 38.4–76.8)
NEUTROS ABS: 2.7 10*3/uL (ref 1.5–6.5)
Platelets: 248 10*3/uL (ref 145–400)
RBC: 5 10*6/uL (ref 3.70–5.45)
RDW: 12.5 % (ref 11.2–14.5)
WBC: 5 10*3/uL (ref 3.9–10.3)

## 2015-05-26 LAB — 5 HIAA, QUANTITATIVE, URINE, 24 HOUR
5-HIAA, 24 Hr Urine: 5.2 mg/24 h (ref <=6.0)
VOLUME, URINE-5HIAA: 1800 mL/(24.h)

## 2015-05-26 LAB — CHROMOGRANIN A: Chromogranin A: 15 ng/mL (ref <=15)

## 2015-06-12 ENCOUNTER — Ambulatory Visit (HOSPITAL_BASED_OUTPATIENT_CLINIC_OR_DEPARTMENT_OTHER): Payer: 59

## 2015-06-12 VITALS — BP 143/81 | HR 85 | Temp 98.3°F

## 2015-06-12 DIAGNOSIS — C7B04 Secondary carcinoid tumors of peritoneum: Secondary | ICD-10-CM | POA: Diagnosis not present

## 2015-06-12 DIAGNOSIS — C7A012 Malignant carcinoid tumor of the ileum: Secondary | ICD-10-CM | POA: Diagnosis not present

## 2015-06-12 DIAGNOSIS — C7B02 Secondary carcinoid tumors of liver: Secondary | ICD-10-CM | POA: Diagnosis not present

## 2015-06-12 DIAGNOSIS — D3A012 Benign carcinoid tumor of the ileum: Secondary | ICD-10-CM

## 2015-06-12 DIAGNOSIS — C7B09 Secondary carcinoid tumors of other sites: Secondary | ICD-10-CM

## 2015-06-12 MED ORDER — OCTREOTIDE ACETATE 30 MG IM KIT
30.0000 mg | PACK | Freq: Once | INTRAMUSCULAR | Status: AC
  Administered 2015-06-12: 30 mg via INTRAMUSCULAR
  Filled 2015-06-12: qty 1

## 2015-06-19 ENCOUNTER — Ambulatory Visit (HOSPITAL_COMMUNITY)
Admission: RE | Admit: 2015-06-19 | Discharge: 2015-06-19 | Disposition: A | Discharge status: Home / Self Care | Payer: 59 | Source: Ambulatory Visit | Attending: Hematology | Admitting: Hematology

## 2015-06-19 ENCOUNTER — Encounter (HOSPITAL_COMMUNITY): Payer: Self-pay

## 2015-06-19 DIAGNOSIS — C786 Secondary malignant neoplasm of retroperitoneum and peritoneum: Secondary | ICD-10-CM | POA: Insufficient documentation

## 2015-06-19 DIAGNOSIS — K760 Fatty (change of) liver, not elsewhere classified: Secondary | ICD-10-CM | POA: Diagnosis not present

## 2015-06-19 DIAGNOSIS — C7B09 Secondary carcinoid tumors of other sites: Secondary | ICD-10-CM

## 2015-06-19 MED ORDER — IOHEXOL 300 MG/ML  SOLN
100.0000 mL | Freq: Once | INTRAMUSCULAR | Status: AC | PRN
  Administered 2015-06-19: 100 mL via INTRAVENOUS

## 2015-06-30 ENCOUNTER — Telehealth: Payer: Self-pay | Admitting: *Deleted

## 2015-06-30 NOTE — Telephone Encounter (Signed)
Pt called and left message wanting to ask Dr. Burr Medico re:  When pt comes in for lab and injection on  07/11/15 , could Dr. Burr Medico ordered cholesterol level to be drawn also.  Pt had her physical on 06/23/15; however, the technologist could not get the level drawn due to problem with collecting the blood sample.  Pt has not had her cholesterol checked in 2 - 3 yrs. Pt's  Phone    [phone].

## 2015-07-04 ENCOUNTER — Encounter: Payer: Self-pay | Admitting: Hematology

## 2015-07-05 NOTE — Telephone Encounter (Signed)
Pt called again requesting cholesterol screen be added to her lab draw on 12/27. Will Dr Burr Medico do this please?

## 2015-07-06 ENCOUNTER — Other Ambulatory Visit: Payer: Self-pay | Admitting: *Deleted

## 2015-07-07 ENCOUNTER — Other Ambulatory Visit: Payer: Self-pay | Admitting: *Deleted

## 2015-07-07 ENCOUNTER — Telehealth: Payer: Self-pay | Admitting: *Deleted

## 2015-07-07 DIAGNOSIS — K760 Fatty (change of) liver, not elsewhere classified: Secondary | ICD-10-CM

## 2015-07-07 NOTE — Telephone Encounter (Signed)
Received faxed order from [name], PA-C @ [facility] for Lipid Panel with reflex to be drawn on 07/11/15 along with labs from our office.  Order placed in computer; copies of lab order given to lab personal to fax results to PA's office. [name], PA-C    Phone    [phone]    ;     Fax      [phone].

## 2015-07-11 ENCOUNTER — Ambulatory Visit (HOSPITAL_BASED_OUTPATIENT_CLINIC_OR_DEPARTMENT_OTHER): Payer: 59

## 2015-07-11 ENCOUNTER — Ambulatory Visit: Payer: 59

## 2015-07-11 ENCOUNTER — Other Ambulatory Visit (HOSPITAL_BASED_OUTPATIENT_CLINIC_OR_DEPARTMENT_OTHER): Payer: 59

## 2015-07-11 ENCOUNTER — Other Ambulatory Visit: Payer: Self-pay | Admitting: Hematology

## 2015-07-11 VITALS — BP 149/89 | HR 74 | Temp 98.7°F

## 2015-07-11 DIAGNOSIS — D3A012 Benign carcinoid tumor of the ileum: Secondary | ICD-10-CM

## 2015-07-11 DIAGNOSIS — C7B09 Secondary carcinoid tumors of other sites: Secondary | ICD-10-CM

## 2015-07-11 DIAGNOSIS — C7A012 Malignant carcinoid tumor of the ileum: Secondary | ICD-10-CM

## 2015-07-11 DIAGNOSIS — C7B02 Secondary carcinoid tumors of liver: Secondary | ICD-10-CM

## 2015-07-11 DIAGNOSIS — K760 Fatty (change of) liver, not elsewhere classified: Secondary | ICD-10-CM

## 2015-07-11 LAB — COMPREHENSIVE METABOLIC PANEL
ALBUMIN: 4.4 g/dL (ref 3.5–5.0)
ALK PHOS: 73 U/L (ref 40–150)
ALT: 70 U/L — ABNORMAL HIGH (ref 0–55)
AST: 65 U/L — AB (ref 5–34)
Anion Gap: 9 mEq/L (ref 3–11)
BILIRUBIN TOTAL: 0.91 mg/dL (ref 0.20–1.20)
BUN: 10 mg/dL (ref 7.0–26.0)
CALCIUM: 9.9 mg/dL (ref 8.4–10.4)
CO2: 28 mEq/L (ref 22–29)
CREATININE: 0.9 mg/dL (ref 0.6–1.1)
Chloride: 105 mEq/L (ref 98–109)
EGFR: 73 mL/min/{1.73_m2} — ABNORMAL LOW (ref >90)
GLUCOSE: 100 mg/dL (ref 70–140)
Potassium: 4.1 mEq/L (ref 3.5–5.1)
SODIUM: 142 meq/L (ref 136–145)
TOTAL PROTEIN: 8.1 g/dL (ref 6.4–8.3)

## 2015-07-11 LAB — CBC WITH DIFFERENTIAL/PLATELET
BASO%: 1 % (ref 0.0–2.0)
BASOS ABS: 0 10*3/uL (ref 0.0–0.1)
EOS%: 0.6 % (ref 0.0–7.0)
Eosinophils Absolute: 0 10*3/uL (ref 0.0–0.5)
HEMATOCRIT: 44 % (ref 34.8–46.6)
HGB: 14.7 g/dL (ref 11.6–15.9)
LYMPH#: 1.6 10*3/uL (ref 0.9–3.3)
LYMPH%: 38.9 % (ref 14.0–49.7)
MCH: 30.4 pg (ref 25.1–34.0)
MCHC: 33.3 g/dL (ref 31.5–36.0)
MCV: 91.3 fL (ref 79.5–101.0)
MONO#: 0.3 10*3/uL (ref 0.1–0.9)
MONO%: 6.2 % (ref 0.0–14.0)
NEUT#: 2.2 10*3/uL (ref 1.5–6.5)
NEUT%: 53.3 % (ref 38.4–76.8)
Platelets: 233 10*3/uL (ref 145–400)
RBC: 4.82 10*6/uL (ref 3.70–5.45)
RDW: 12.6 % (ref 11.2–14.5)
WBC: 4.1 10*3/uL (ref 3.9–10.3)

## 2015-07-11 MED ORDER — OCTREOTIDE ACETATE 30 MG IM KIT
30.0000 mg | PACK | Freq: Once | INTRAMUSCULAR | Status: AC
  Administered 2015-07-11: 30 mg via INTRAMUSCULAR
  Filled 2015-07-11: qty 1

## 2015-07-14 LAB — LIPID PANEL
CHOL/HDL RATIO: 3.3 ratio (ref <=5.0)
CHOLESTEROL: 160 mg/dL (ref 125–200)
HDL: 48 mg/dL (ref >=46)
LDL Cholesterol: 88 mg/dL (ref <130)
Triglycerides: 120 mg/dL (ref <150)
VLDL: 24 mg/dL (ref <30)

## 2015-07-14 LAB — CHROMOGRANIN A: Chromogranin A: 16 ng/mL — ABNORMAL HIGH (ref <=15)

## 2015-07-17 ENCOUNTER — Encounter: Payer: Self-pay | Admitting: Hematology

## 2015-07-17 NOTE — Progress Notes (Signed)
[facility] OFFICE PROGRESS NOTE  Patient Care Team: [name], PA-C as PCP - General (Physician Assistant)  DIAGNOSIS: 1. 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  2. 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 The patient underwent surgical resection of her carcinoid tumor on 08/17/2005. Two out of 13 lymph nodes were positive. The patient's stage was III. The patient was noted to have an elevated urinary 5HIAA level in July 2011. A positive octreotide scan was noted coming from the right lower pelvis adjacent to the bladder on 09/06/2010. Initially this was felt to be a uterine fibroid. The patient has continued to have positive octreotide scan on 11/07/2011 and 05/06/2012. CT scans of the abdomen and pelvis were also carried out on 03/02/2010, 02/24/2012 and 06/30/2012. These scans in conjunction with an elevated 5HIAA level of 22.7 on 04/13/2012 and a chromogranin A level which had risen to 18.0 on 03/23/2012 strongly suggested that the patient's disease had recurred. The patient required admission to the hospital from 06/30/2012 through 07/03/2012 for an episode of upper abdominal pain which occurred suddenly the day prior to admission. The patient was started on somatostatin LAR 30 mg IM on 07/07/2012 for recurrent disease involving the lateral segment of the left hepatic lobe, a right jejunal mesenteric node and 2 enhancing masses adjacent to the uterus. Twenty-four-hour urine 5HIAA on 10/19/2012 was 5.5. CT scan of the abdomen and pelvis with IV contrast on 10/19/2012 showed generally stable disease with the exception of a decrease in the right abdominal mesenteric lymphadenopathy. There have been 2 lesions in the liver which are felt to be stable. One lesion is in the lateral segment of the  left hepatic lobe measuring 14 x 14 mm. The second lesion is seen in the lateral segment adjacent to the falciform ligament and measures 9 mm.  2. Uterine fibroids noted on imaging studies dating back to late April 2010.  3. Elevated liver transaminases noted in March 2007. The patient was evaluated by Dr. Delfin Edis in October 2010 and felt that the etiology was hepatic steatosis.  4. Admission to the hospital for upper abdominal pain with sudden onset. Admission dates were 06/30/2012 through 07/03/2012. CT scan of the abdomen and pelvis with IV contrast on 06/30/2012 showed no definite etiology for the pain. It was noted that a lymph node in the right jejunal mesentery had developed central necrosis when compared with the prior study of 02/24/2012. There was no evidence for bowel obstruction. There has previously been no evidence for gallstones on recent CT scans, and an abdominal ultrasound dating back to 05/08/2009.  5. Possible mass involving the left breast on screening mammogram from 06/18/2012. A diagnostic mammogram and ultrasound of the left breast carried out on 07/17/2012 were normal. Followup screening mammogram was suggested in 1 year.  6. Diarrhea with onset in December 2013, somewhat improved with Imodium.   CURRENT THERAPY:  Sandostatin LAR 30 mg IM started on 07/07/2012   INTERVAL HISTORY: [name] returns for follow up. She is doing well overall. She has occasional diarrhea, mostly related to diet. She is taking potassium every other day. No other new complaints. She denies any flushing, abdominal pain, nausea, or other new symptoms. She has good appetite and energy level, works full-time. Weight is stable.  REVIEW OF SYSTEMS:   Constitutional:  Denies fevers, chills or abnormal weight loss Eyes: Denies blurriness of vision Ears, nose, mouth, throat, and face: Denies mucositis or sore throat Respiratory: Denies cough, dyspnea or wheezes Cardiovascular: Denies  palpitation, chest discomfort or lower extremity swelling Gastrointestinal:  Denies nausea, heartburn or change in bowel habits Skin: Denies abnormal skin rashes Lymphatics: Denies new lymphadenopathy or easy bruising Neurological:Denies numbness, tingling or new weaknesses Behavioral/Psych: Mood is stable, no new changes  All other systems were reviewed with the patient and are negative.  MEDICAL HISTORY:  Past Medical History  Diagnosis Date  . 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 Nonspecific elevation of levels of transaminase or lactic acid dehydrogenase (LDH)   . Hypopotassemia   . Diverticulosis   . Hepatic steatosis 02/24/12  . Uterine fibroid   . Nephrolithiasis   . Small bowel obstruction ([place])   . 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 ([place])     liver,mesentery,pelvis  . carcinoid tumor dx'd 08/2005    SURGICAL HISTORY: Past Surgical History  Procedure Laterality Date  . Tubal ligation    . Carcinoid tumor resection      ileum    I have reviewed the social history and family history with the patient and they are unchanged from previous note.  ALLERGIES:  is allergic to cephalexin and amoxicillin.  MEDICATIONS:  Current Outpatient Prescriptions  Medication Sig Dispense Refill  . ascorbic acid (VITAMIN C) 500 MG tablet Take 500 mg by mouth daily.    . Calcium Carbonate-Vitamin D (CALCIUM 600-D PO) Take 1 tablet by mouth daily.    . Cholecalciferol (VITAMIN D-3) 5000 UNITS TABS Take 1 tablet by mouth daily.    [name] loperamide (IMODIUM) 2 MG capsule Take by mouth as needed for diarrhea or loose stools.    . Multiple Vitamin (MULTIVITAMIN) tablet Take 1 tablet by mouth daily.    [name] octreotide (SANDOSTATIN LAR) 30 MG injection Inject 30 mg into the muscle every 28 (twenty-eight) days.    . potassium chloride SA (K-DUR,KLOR-CON) 20 MEQ tablet Take 1 tablet by mouth  daily (Patient taking differently: Take 1 tablet by mouth  every other day.) 90 tablet 0     No current facility-administered medications for this visit.    PHYSICAL EXAMINATION: ECOG PERFORMANCE STATUS: 0 - Asymptomatic  Filed Vitals:   07/18/15 0830  BP: 144/85  Pulse: 83  Temp: 98.2 F (36.8 C)  Resp: 18   Filed Weights   07/18/15 0830  Weight: 133 lb 14.4 oz (60.737 kg)    GENERAL:alert, no distress and comfortable SKIN: skin color, texture, turgor are normal, no rashes or significant lesions EYES: normal, Conjunctiva are pink and non-injected, sclera clear OROPHARYNX:no exudate, no erythema and lips, buccal mucosa, and tongue normal  NECK: supple, thyroid normal size, non-tender, without nodularity LYMPH:  no palpable lymphadenopathy in the cervical, axillary or inguinal LUNGS: clear to auscultation and percussion with normal breathing effort HEART: regular rate & rhythm and no murmurs and no lower extremity edema ABDOMEN:abdomen soft, non-tender and normal bowel sounds Musculoskeletal:no cyanosis of digits and no clubbing  NEURO: alert & oriented x 3 with fluent speech, no focal motor/sensory deficits  LABORATORY DATA:  I have reviewed the data as listed CBC Latest Ref Rng 07/11/2015 05/22/2015 03/21/2015  WBC 3.9 - 10.3 10e3/uL 4.1 5.0 4.9  Hemoglobin 11.6 - 15.9 g/dL 14.7 15.0 15.1  Hematocrit 34.8 - 46.6 % 44.0 45.7 44.8  Platelets 145 - 400 10e3/uL 233 248 244  CMP Latest Ref Rng 07/11/2015 05/22/2015 03/21/2015  Glucose 70 - 140 mg/dl 100 101 77  BUN 7.0 - 26.0 mg/dL 10.0 11.4 11.6  Creatinine 0.6 - 1.1 mg/dL 0.9 0.8 0.9  Sodium 136 - 145 mEq/L 142 143 144  Potassium 3.5 - 5.1 mEq/L 4.1 3.6 3.9  Chloride 98 - 107 mEq/L - - -  CO2 22 - 29 mEq/L 28 28 30(H)  Calcium 8.4 - 10.4 mg/dL 9.9 10.0 10.2  Total Protein 6.4 - 8.3 g/dL 8.1 8.0 8.1  Total Bilirubin 0.20 - 1.20 mg/dL 0.91 0.70 0.79  Alkaline Phos 40 - 150 U/L 73 78 80  AST 5 - 34 U/L 65(H) 57(H) 66(H)  ALT 0 - 55 U/L 70(H) 70(H) 77(H)     Status: Finalresult Visible to patient:  Not  Released Nextappt: 07/18/2015 at 08:30 AM in Oncology Burr Medico, Krista Blue, MD) Dx:  Metastatic carcinoid tumor to intra-a...              Ref Range 6d ago  53mo ago  64mo ago     Chromogranin A <=15 ng/mL 16 (H) 15CM 14CM         5 HIAA, quantitative, urine, 24 hour  Status: Finalresult Visible to patient:  MyChart Nextappt: 07/18/2015 at 08:30 AM in Oncology Burr Medico, Krista Blue, MD) Dx:  Metastatic carcinoid tumor to intra-a...           Ref Range 78mo ago  103mo ago  51mo ago     5-HIAA, 24 Hr Urine <=6.0 mg/24 h 5.2 5.7 2.6    Volume, Urine-5HIAA mL/24 h 1800 1400 2200           RADIOGRAPHIC STUDIES:  CT chest, abdomen and pelvis 06/19/2015 IMPRESSION: 1. Continued stability of multifocal metastatic disease within the liver, peritoneum and adnexa. No disease progression identified. 2. No suspicious findings in the chest. 3. Hepatic steatosis.    ASSESSMENT & PLAN:  76-yo female with   1. Metastatic carcinoid tumor of ileum.  -We reviewed the natural history of metastatic carcinoid tumor. She understands this is an incurable disease, and the goal of therapy is disease control and prolong her life. -We also reviewed the overall treatment strategy for metastatic carcinoid tumor, she understands her disease will probably progress at some point, and we'll move on to second or third line treatment, such as underlying Everolimus or chemotherapy. -We also discussed the role of liver targeted therapy, I asked her question about the role of surgery or palliative radiation.  -She has been doing very well clinically, chromogranin A and urine 5 HIAA level are mostly normal. -lab result reviewed with her. Liver enzymes are mildly elevated, stable overall -I discussed her recent restaging CT scan, which showed stable disease, no new lesions. -continue Sandostatin injection q4w.  -Repeat lab every 2 months  2. Hypokalemia -resolved now -Probably related to  her diarrhea -Her potassium 4.1 last week -She is on potassium 1 tablet every other day.  3. HTN -slightly high -I encouraged her to monitor her blood pressure home. -Follow-up with primary care physician -Per patient's request, we checked her lipid panel last week, I gave her a copy of her lab results.  4. Immunization -She got a flu shot in 03/2015  She will continue follow up with her PCP and other specialists.   Follow-up: Continue Sandostatin every 4 weeks. Return to my clinic in 3 month, lab every 2 month.   All questions were answered. The patient knows to call the clinic with any  problems, questions or concerns. No barriers to learning was detected.  I spent 20 minutes counseling the patient face to face. The total time spent in the appointment was 30 minutes and more than 50% was on counseling and review of test results     Truitt Merle, MD 07/18/2015  8:50 AM

## 2015-07-18 ENCOUNTER — Ambulatory Visit (HOSPITAL_BASED_OUTPATIENT_CLINIC_OR_DEPARTMENT_OTHER): Payer: 59 | Admitting: Hematology

## 2015-07-18 VITALS — BP 144/85 | HR 83 | Temp 98.2°F | Resp 18 | Ht 62.0 in | Wt 133.9 lb

## 2015-07-18 DIAGNOSIS — I10 Essential (primary) hypertension: Secondary | ICD-10-CM | POA: Diagnosis not present

## 2015-07-18 DIAGNOSIS — C7B09 Secondary carcinoid tumors of other sites: Secondary | ICD-10-CM

## 2015-07-18 DIAGNOSIS — C7A012 Malignant carcinoid tumor of the ileum: Secondary | ICD-10-CM | POA: Diagnosis not present

## 2015-07-23 ENCOUNTER — Telehealth: Payer: Self-pay | Admitting: Hematology

## 2015-07-23 NOTE — Telephone Encounter (Signed)
Spoke with patient confirming monthly appointment for 1/23 thru 4/17.

## 2015-08-07 ENCOUNTER — Ambulatory Visit (HOSPITAL_BASED_OUTPATIENT_CLINIC_OR_DEPARTMENT_OTHER): Payer: 59

## 2015-08-07 VITALS — BP 150/87 | HR 81 | Temp 98.3°F

## 2015-08-07 DIAGNOSIS — C7A012 Malignant carcinoid tumor of the ileum: Secondary | ICD-10-CM | POA: Diagnosis not present

## 2015-08-07 DIAGNOSIS — C7B09 Secondary carcinoid tumors of other sites: Secondary | ICD-10-CM

## 2015-08-07 DIAGNOSIS — C7B02 Secondary carcinoid tumors of liver: Secondary | ICD-10-CM

## 2015-08-07 DIAGNOSIS — C7B04 Secondary carcinoid tumors of peritoneum: Secondary | ICD-10-CM

## 2015-08-07 DIAGNOSIS — D3A012 Benign carcinoid tumor of the ileum: Secondary | ICD-10-CM

## 2015-08-07 MED ORDER — OCTREOTIDE ACETATE 30 MG IM KIT
30.0000 mg | PACK | Freq: Once | INTRAMUSCULAR | Status: AC
  Administered 2015-08-07: 30 mg via INTRAMUSCULAR
  Filled 2015-08-07: qty 1

## 2015-09-04 ENCOUNTER — Ambulatory Visit (HOSPITAL_BASED_OUTPATIENT_CLINIC_OR_DEPARTMENT_OTHER): Payer: 59

## 2015-09-04 VITALS — BP 156/76 | HR 80 | Temp 98.5°F

## 2015-09-04 DIAGNOSIS — D3A012 Benign carcinoid tumor of the ileum: Secondary | ICD-10-CM

## 2015-09-04 DIAGNOSIS — C7A012 Malignant carcinoid tumor of the ileum: Secondary | ICD-10-CM | POA: Diagnosis not present

## 2015-09-04 DIAGNOSIS — C7B09 Secondary carcinoid tumors of other sites: Secondary | ICD-10-CM

## 2015-09-04 MED ORDER — OCTREOTIDE ACETATE 30 MG IM KIT
30.0000 mg | PACK | Freq: Once | INTRAMUSCULAR | Status: AC
  Administered 2015-09-04: 30 mg via INTRAMUSCULAR
  Filled 2015-09-04: qty 1

## 2015-09-07 LAB — 5 HIAA, QUANTITATIVE, URINE, 24 HOUR
5-HIAA, Urine: 3.5 mg/L
5-HIAA,QUANT.,24 HR URINE: 5.3 mg/(24.h) (ref 0.0–14.9)

## 2015-10-02 ENCOUNTER — Ambulatory Visit (HOSPITAL_BASED_OUTPATIENT_CLINIC_OR_DEPARTMENT_OTHER): Payer: 59

## 2015-10-02 VITALS — BP 143/84 | HR 77 | Temp 98.3°F

## 2015-10-02 DIAGNOSIS — D3A012 Benign carcinoid tumor of the ileum: Secondary | ICD-10-CM

## 2015-10-02 DIAGNOSIS — C7B09 Secondary carcinoid tumors of other sites: Secondary | ICD-10-CM

## 2015-10-02 DIAGNOSIS — C7A012 Malignant carcinoid tumor of the ileum: Secondary | ICD-10-CM

## 2015-10-02 MED ORDER — OCTREOTIDE ACETATE 30 MG IM KIT
30.0000 mg | PACK | Freq: Once | INTRAMUSCULAR | Status: AC
  Administered 2015-10-02: 30 mg via INTRAMUSCULAR
  Filled 2015-10-02: qty 1

## 2015-10-30 ENCOUNTER — Other Ambulatory Visit (HOSPITAL_BASED_OUTPATIENT_CLINIC_OR_DEPARTMENT_OTHER): Payer: 59

## 2015-10-30 ENCOUNTER — Encounter: Payer: Self-pay | Admitting: Hematology

## 2015-10-30 ENCOUNTER — Telehealth: Payer: Self-pay | Admitting: Hematology

## 2015-10-30 ENCOUNTER — Ambulatory Visit: Payer: 59

## 2015-10-30 ENCOUNTER — Ambulatory Visit (HOSPITAL_BASED_OUTPATIENT_CLINIC_OR_DEPARTMENT_OTHER): Payer: 59 | Admitting: Hematology

## 2015-10-30 VITALS — BP 152/81 | HR 89 | Temp 98.0°F | Resp 18 | Ht 62.0 in | Wt 135.1 lb

## 2015-10-30 DIAGNOSIS — C7B09 Secondary carcinoid tumors of other sites: Secondary | ICD-10-CM

## 2015-10-30 DIAGNOSIS — I10 Essential (primary) hypertension: Secondary | ICD-10-CM

## 2015-10-30 DIAGNOSIS — C7B02 Secondary carcinoid tumors of liver: Secondary | ICD-10-CM | POA: Diagnosis not present

## 2015-10-30 DIAGNOSIS — C7B04 Secondary carcinoid tumors of peritoneum: Secondary | ICD-10-CM

## 2015-10-30 DIAGNOSIS — C7A012 Malignant carcinoid tumor of the ileum: Secondary | ICD-10-CM | POA: Diagnosis not present

## 2015-10-30 DIAGNOSIS — E876 Hypokalemia: Secondary | ICD-10-CM

## 2015-10-30 DIAGNOSIS — Z1231 Encounter for screening mammogram for malignant neoplasm of breast: Secondary | ICD-10-CM

## 2015-10-30 LAB — CBC WITH DIFFERENTIAL/PLATELET
BASO%: 1 % (ref 0.0–2.0)
Basophils Absolute: 0 10*3/uL (ref 0.0–0.1)
EOS ABS: 0 10*3/uL (ref 0.0–0.5)
EOS%: 0.7 % (ref 0.0–7.0)
HCT: 45 % (ref 34.8–46.6)
HEMOGLOBIN: 14.8 g/dL (ref 11.6–15.9)
LYMPH%: 34 % (ref 14.0–49.7)
MCH: 30 pg (ref 25.1–34.0)
MCHC: 32.8 g/dL (ref 31.5–36.0)
MCV: 91.3 fL (ref 79.5–101.0)
MONO#: 0.2 10*3/uL (ref 0.1–0.9)
MONO%: 4.4 % (ref 0.0–14.0)
NEUT%: 59.9 % (ref 38.4–76.8)
NEUTROS ABS: 2.9 10*3/uL (ref 1.5–6.5)
Platelets: 223 10*3/uL (ref 145–400)
RBC: 4.93 10*6/uL (ref 3.70–5.45)
RDW: 12.5 % (ref 11.2–14.5)
WBC: 4.8 10*3/uL (ref 3.9–10.3)
lymph#: 1.6 10*3/uL (ref 0.9–3.3)

## 2015-10-30 LAB — COMPREHENSIVE METABOLIC PANEL
ALBUMIN: 4.2 g/dL (ref 3.5–5.0)
ALK PHOS: 70 U/L (ref 40–150)
ALT: 75 U/L — AB (ref 0–55)
AST: 75 U/L — AB (ref 5–34)
Anion Gap: 10 mEq/L (ref 3–11)
BILIRUBIN TOTAL: 0.84 mg/dL (ref 0.20–1.20)
BUN: 10.9 mg/dL (ref 7.0–26.0)
CALCIUM: 10 mg/dL (ref 8.4–10.4)
CHLORIDE: 106 meq/L (ref 98–109)
CO2: 28 mEq/L (ref 22–29)
CREATININE: 0.9 mg/dL (ref 0.6–1.1)
EGFR: 68 mL/min/{1.73_m2} — ABNORMAL LOW (ref >90)
Glucose: 113 mg/dl (ref 70–140)
Potassium: 3.7 mEq/L (ref 3.5–5.1)
Sodium: 144 mEq/L (ref 136–145)
TOTAL PROTEIN: 8.1 g/dL (ref 6.4–8.3)

## 2015-10-30 NOTE — Progress Notes (Signed)
[facility] OFFICE PROGRESS NOTE  Patient Care Team: [name], PA-C as PCP - General (Physician Assistant)  DIAGNOSIS: 1. 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  2. 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 The patient underwent surgical resection of her carcinoid tumor on 08/17/2005. Two out of 13 lymph nodes were positive. The patient's stage was III. The patient was noted to have an elevated urinary 5HIAA level in July 2011. A positive octreotide scan was noted coming from the right lower pelvis adjacent to the bladder on 09/06/2010. Initially this was felt to be a uterine fibroid. The patient has continued to have positive octreotide scan on 11/07/2011 and 05/06/2012. CT scans of the abdomen and pelvis were also carried out on 03/02/2010, 02/24/2012 and 06/30/2012. These scans in conjunction with an elevated 5HIAA level of 22.7 on 04/13/2012 and a chromogranin A level which had risen to 18.0 on 03/23/2012 strongly suggested that the patient's disease had recurred. The patient required admission to the hospital from 06/30/2012 through 07/03/2012 for an episode of upper abdominal pain which occurred suddenly the day prior to admission. The patient was started on somatostatin LAR 30 mg IM on 07/07/2012 for recurrent disease involving the lateral segment of the left hepatic lobe, a right jejunal mesenteric node and 2 enhancing masses adjacent to the uterus. Twenty-four-hour urine 5HIAA on 10/19/2012 was 5.5. CT scan of the abdomen and pelvis with IV contrast on 10/19/2012 showed generally stable disease with the exception of a decrease in the right abdominal mesenteric lymphadenopathy. There have been 2 lesions in the liver which are felt to be stable. One lesion is in the lateral segment of the  left hepatic lobe measuring 14 x 14 mm. The second lesion is seen in the lateral segment adjacent to the falciform ligament and measures 9 mm.  2. Uterine fibroids noted on imaging studies dating back to late April 2010.  3. Elevated liver transaminases noted in March 2007. The patient was evaluated by Dr. Delfin Edis in October 2010 and felt that the etiology was hepatic steatosis.  4. Admission to the hospital for upper abdominal pain with sudden onset. Admission dates were 06/30/2012 through 07/03/2012. CT scan of the abdomen and pelvis with IV contrast on 06/30/2012 showed no definite etiology for the pain. It was noted that a lymph node in the right jejunal mesentery had developed central necrosis when compared with the prior study of 02/24/2012. There was no evidence for bowel obstruction. There has previously been no evidence for gallstones on recent CT scans, and an abdominal ultrasound dating back to 05/08/2009.  5. Possible mass involving the left breast on screening mammogram from 06/18/2012. A diagnostic mammogram and ultrasound of the left breast carried out on 07/17/2012 were normal. Followup screening mammogram was suggested in 1 year.  6. Diarrhea with onset in December 2013, somewhat improved with Imodium.   CURRENT THERAPY:  Sandostatin LAR 30 mg IM started on 07/07/2012   INTERVAL HISTORY: Mrs. Supino returns for follow up. She is doing well overall. She is doing well overall. She still has diarrhea occasionally, possible related to her diet, 2 times a day, happens 1-2 times a week, she take imodium, which helps, she has rectal discomfort when she has diarrhea, no hematochezia. She otherwise feels well, has good appetite and energy level,  denies any significant pain, dyspnea or other discomfort. Her weight is stable.   REVIEW OF SYSTEMS:   Constitutional: Denies fevers, chills or abnormal weight loss Eyes: Denies blurriness of vision Ears, nose, mouth, throat, and face:  Denies mucositis or sore throat Respiratory: Denies cough, dyspnea or wheezes Cardiovascular: Denies palpitation, chest discomfort or lower extremity swelling Gastrointestinal:  Denies nausea, heartburn or change in bowel habits Skin: Denies abnormal skin rashes Lymphatics: Denies new lymphadenopathy or easy bruising Neurological:Denies numbness, tingling or new weaknesses Behavioral/Psych: Mood is stable, no new changes  All other systems were reviewed with the patient and are negative.  MEDICAL HISTORY:  Past Medical History  Diagnosis Date  . 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 Nonspecific elevation of levels of transaminase or lactic acid dehydrogenase (LDH)   . Hypopotassemia   . Diverticulosis   . Hepatic steatosis 02/24/12  . Uterine fibroid   . Nephrolithiasis   . Small bowel obstruction ([place])   . 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 ([place])     liver,mesentery,pelvis  . carcinoid tumor dx'd 08/2005    SURGICAL HISTORY: Past Surgical History  Procedure Laterality Date  . Tubal ligation    . Carcinoid tumor resection      ileum    I have reviewed the social history and family history with the patient and they are unchanged from previous note.  ALLERGIES:  is allergic to cephalexin and amoxicillin.  MEDICATIONS:  Current Outpatient Prescriptions  Medication Sig Dispense Refill  . ascorbic acid (VITAMIN C) 500 MG tablet Take 500 mg by mouth daily.    . Calcium Carbonate-Vitamin D (CALCIUM 600-D PO) Take 1 tablet by mouth daily.    . Cholecalciferol (VITAMIN D-3) 5000 UNITS TABS Take 1 tablet by mouth daily.    [name] loperamide (IMODIUM) 2 MG capsule Take by mouth as needed for diarrhea or loose stools.    . Multiple Vitamin (MULTIVITAMIN) tablet Take 1 tablet by mouth daily.    [name] octreotide (SANDOSTATIN LAR) 30 MG injection Inject 30 mg into the muscle every 28 (twenty-eight) days.    . potassium chloride SA (K-DUR,KLOR-CON) 20 MEQ tablet Take 1  tablet by mouth  daily (Patient taking differently: Take 1 tablet by mouth  every other day.) 90 tablet 0   No current facility-administered medications for this visit.    PHYSICAL EXAMINATION: ECOG PERFORMANCE STATUS: 0 - Asymptomatic  Filed Vitals:   10/30/15 0822  BP: 152/81  Pulse: 89  Temp: 98 F (36.7 C)  Resp: 18   Filed Weights   10/30/15 0822  Weight: 135 lb 1.6 oz (61.281 kg)    GENERAL:alert, no distress and comfortable SKIN: skin color, texture, turgor are normal, no rashes or significant lesions EYES: normal, Conjunctiva are pink and non-injected, sclera clear OROPHARYNX:no exudate, no erythema and lips, buccal mucosa, and tongue normal  NECK: supple, thyroid normal size, non-tender, without nodularity LYMPH:  no palpable lymphadenopathy in the cervical, axillary or inguinal LUNGS: clear to auscultation and percussion with normal breathing effort HEART: regular rate & rhythm and no murmurs and no lower extremity edema ABDOMEN:abdomen soft, non-tender and normal bowel sounds Musculoskeletal:no cyanosis of digits and no clubbing  NEURO: alert & oriented x 3 with fluent speech, no focal motor/sensory deficits  LABORATORY DATA:  I have reviewed the data as listed CBC Latest Ref Rng 10/30/2015 07/11/2015 05/22/2015  WBC 3.9 - 10.3 10e3/uL 4.8 4.1 5.0  Hemoglobin 11.6 - 15.9 g/dL 14.8 14.7 15.0  Hematocrit  34.8 - 46.6 % 45.0 44.0 45.7  Platelets 145 - 400 10e3/uL 223 233 248    CMP Latest Ref Rng 07/11/2015 05/22/2015 03/21/2015  Glucose 70 - 140 mg/dl 100 101 77  BUN 7.0 - 26.0 mg/dL 10.0 11.4 11.6  Creatinine 0.6 - 1.1 mg/dL 0.9 0.8 0.9  Sodium 136 - 145 mEq/L 142 143 144  Potassium 3.5 - 5.1 mEq/L 4.1 3.6 3.9  CO2 22 - 29 mEq/L 28 28 30(H)  Calcium 8.4 - 10.4 mg/dL 9.9 10.0 10.2  Total Protein 6.4 - 8.3 g/dL 8.1 8.0 8.1  Total Bilirubin 0.20 - 1.20 mg/dL 0.91 0.70 0.79  Alkaline Phos 40 - 150 U/L 73 78 80  AST 5 - 34 U/L 65(H) 57(H) 66(H)  ALT 0 - 55 U/L  70(H) 70(H) 77(H)     Status: Finalresult Visible to patient:  Not Released Nextappt: 07/18/2015 at 08:30 AM in Oncology Burr Medico, Krista Blue, MD) Dx:  Metastatic carcinoid tumor to intra-a...              Ref Range 6d ago  12mo ago  105mo ago     Chromogranin A <=15 ng/mL 16 (H) 15CM 14CM        5 HIAA, quantitative, urine, 24 hour  Status: Finalresult Visible to patient:  MyChart Nextappt: Today at 08:15 AM in Oncology ([facility])           Ref Range 63mo ago  72mo ago     5-HIAA, Urine Undefined mg/L 3.5 5.2R   Comments: Total Volume: 1500 mL    5-HIAA,Quant.,24 Hr Urine 0.0 - 14.9 mg/24 hr 5.3             RADIOGRAPHIC STUDIES:  CT chest, abdomen and pelvis 06/19/2015 IMPRESSION: 1. Continued stability of multifocal metastatic disease within the liver, peritoneum and adnexa. No disease progression identified. 2. No suspicious findings in the chest. 3. Hepatic steatosis.    ASSESSMENT & PLAN:  63-yo female with   1. Metastatic carcinoid tumor of ileum to liver, peritoneum and adnexa -We reviewed the natural history of metastatic carcinoid tumor. She understands this is an incurable disease, and the goal of therapy is disease control and prolong her life. -We also reviewed the overall treatment strategy for metastatic carcinoid tumor, she understands her disease will probably progress at some point, and we'll move on to second or third line treatment, such as underlying Everolimus or chemotherapy. -We also discussed the role of liver targeted therapy, and new treatment option 177 Lu-Dotatate in the future if needed  -She has been doing very well clinically, chromogranin A and urine 5 HIAA level are mostly normal. -lab result reviewed with her. CBC is normal, CMP and chromogranin A A level are still pending -Her last CT scan in December 2016 showed stable disease, no new lesions, we'll repeat a restaging scan in 3 months    -continue Sandostatin injection q4w.   2. Hypokalemia -Probably related to her diarrhea -She is on potassium 1 tablet every other day. -Today's CMP still pending, we'll adjust her potassium supplement as needed   3. HTN -I encouraged her to monitor her blood pressure home. -Follow-up with primary care physician  4. Cancer screening -She is overdue for mammogram, last mammogram in July 2015. I'll order one for her today.  She will continue follow up with her PCP and other specialists.   Follow-up: Continue Sandostatin every 4 weeks. Return to my clinic in 3 month,  with lab and CT scan 1  week before. She is overdue for screening mammogram, I ordered one for her today.   All questions were answered. The patient knows to call the clinic with any problems, questions or concerns. No barriers to learning was detected.  I spent 20 minutes counseling the patient face to face. The total time spent in the appointment was 30 minutes and more than 50% was on counseling and review of test results     Truitt Merle, MD 10/30/2015  8:49 AM

## 2015-10-30 NOTE — Telephone Encounter (Signed)
Gave and printed appt shced and avs for pt for May thru July...gv barium

## 2015-10-30 NOTE — Telephone Encounter (Signed)
inj nurse not here pt wanted to r/s to tomorrow

## 2015-10-31 ENCOUNTER — Ambulatory Visit (HOSPITAL_BASED_OUTPATIENT_CLINIC_OR_DEPARTMENT_OTHER): Payer: 59

## 2015-10-31 ENCOUNTER — Other Ambulatory Visit: Payer: Self-pay | Admitting: Hematology

## 2015-10-31 VITALS — BP 148/95 | HR 82 | Temp 98.7°F

## 2015-10-31 DIAGNOSIS — D3A012 Benign carcinoid tumor of the ileum: Secondary | ICD-10-CM

## 2015-10-31 DIAGNOSIS — C7B09 Secondary carcinoid tumors of other sites: Secondary | ICD-10-CM

## 2015-10-31 DIAGNOSIS — C7A012 Malignant carcinoid tumor of the ileum: Secondary | ICD-10-CM

## 2015-10-31 DIAGNOSIS — C7B02 Secondary carcinoid tumors of liver: Secondary | ICD-10-CM

## 2015-10-31 DIAGNOSIS — C7B04 Secondary carcinoid tumors of peritoneum: Secondary | ICD-10-CM | POA: Diagnosis not present

## 2015-10-31 LAB — CHROMOGRANIN A: CHROMOGRAN A: 4 nmol/L (ref 0–5)

## 2015-10-31 MED ORDER — OCTREOTIDE ACETATE 30 MG IM KIT
30.0000 mg | PACK | Freq: Once | INTRAMUSCULAR | Status: AC
  Administered 2015-10-31: 30 mg via INTRAMUSCULAR
  Filled 2015-10-31: qty 1

## 2015-11-02 LAB — CHROMOGRANIN A (PARALLEL TESTING): CHROMOGRANIN A: 13 ng/mL (ref <=15)

## 2015-11-27 ENCOUNTER — Ambulatory Visit (HOSPITAL_BASED_OUTPATIENT_CLINIC_OR_DEPARTMENT_OTHER): Payer: 59

## 2015-11-27 ENCOUNTER — Telehealth: Payer: Self-pay | Admitting: *Deleted

## 2015-11-27 ENCOUNTER — Other Ambulatory Visit: Payer: Self-pay | Admitting: *Deleted

## 2015-11-27 VITALS — BP 157/84 | HR 75 | Temp 98.2°F

## 2015-11-27 DIAGNOSIS — C7A012 Malignant carcinoid tumor of the ileum: Secondary | ICD-10-CM | POA: Diagnosis not present

## 2015-11-27 DIAGNOSIS — C7B02 Secondary carcinoid tumors of liver: Secondary | ICD-10-CM | POA: Diagnosis not present

## 2015-11-27 DIAGNOSIS — D3A012 Benign carcinoid tumor of the ileum: Secondary | ICD-10-CM

## 2015-11-27 DIAGNOSIS — C7B04 Secondary carcinoid tumors of peritoneum: Secondary | ICD-10-CM

## 2015-11-27 DIAGNOSIS — C7B09 Secondary carcinoid tumors of other sites: Secondary | ICD-10-CM

## 2015-11-27 MED ORDER — OCTREOTIDE ACETATE 30 MG IM KIT
30.0000 mg | PACK | Freq: Once | INTRAMUSCULAR | Status: AC
  Administered 2015-11-27: 30 mg via INTRAMUSCULAR
  Filled 2015-11-27: qty 1

## 2015-11-27 NOTE — Telephone Encounter (Signed)
Pt called and left message wanting to know if pt should  have urine testing for 5-HIAA in July.   Dr. Burr Medico notified.  Called pt and left message on work phone provided re:  Per Dr. Burr Medico,  No need for urine check in July.  MD will decide at next office visit.

## 2015-11-28 ENCOUNTER — Telehealth: Payer: Self-pay | Admitting: Hematology

## 2015-11-28 NOTE — Telephone Encounter (Signed)
Patient called to r/s 7/10 lab/inj from 9 am to 8 am. Patient has new appointment time.

## 2015-12-25 ENCOUNTER — Ambulatory Visit (HOSPITAL_BASED_OUTPATIENT_CLINIC_OR_DEPARTMENT_OTHER): Payer: 59

## 2015-12-25 VITALS — BP 159/80 | HR 86 | Temp 98.3°F | Resp 18

## 2015-12-25 DIAGNOSIS — C7B04 Secondary carcinoid tumors of peritoneum: Secondary | ICD-10-CM

## 2015-12-25 DIAGNOSIS — C7B09 Secondary carcinoid tumors of other sites: Secondary | ICD-10-CM

## 2015-12-25 DIAGNOSIS — C7A012 Malignant carcinoid tumor of the ileum: Secondary | ICD-10-CM | POA: Diagnosis not present

## 2015-12-25 DIAGNOSIS — C7B02 Secondary carcinoid tumors of liver: Secondary | ICD-10-CM

## 2015-12-25 DIAGNOSIS — D3A012 Benign carcinoid tumor of the ileum: Secondary | ICD-10-CM

## 2015-12-25 MED ORDER — OCTREOTIDE ACETATE 30 MG IM KIT
30.0000 mg | PACK | Freq: Once | INTRAMUSCULAR | Status: AC
  Administered 2015-12-25: 30 mg via INTRAMUSCULAR
  Filled 2015-12-25: qty 1

## 2015-12-25 NOTE — Patient Instructions (Signed)

## 2016-01-15 ENCOUNTER — Ambulatory Visit (HOSPITAL_COMMUNITY)
Admission: RE | Admit: 2016-01-15 | Discharge: 2016-01-15 | Disposition: A | Discharge status: Home / Self Care | Payer: 59 | Source: Ambulatory Visit | Attending: Hematology | Admitting: Hematology

## 2016-01-15 ENCOUNTER — Encounter (HOSPITAL_COMMUNITY): Payer: Self-pay

## 2016-01-15 DIAGNOSIS — I700 Atherosclerosis of aorta: Secondary | ICD-10-CM | POA: Diagnosis not present

## 2016-01-15 DIAGNOSIS — K760 Fatty (change of) liver, not elsewhere classified: Secondary | ICD-10-CM | POA: Diagnosis not present

## 2016-01-15 DIAGNOSIS — C787 Secondary malignant neoplasm of liver and intrahepatic bile duct: Secondary | ICD-10-CM | POA: Insufficient documentation

## 2016-01-15 DIAGNOSIS — C7B09 Secondary carcinoid tumors of other sites: Secondary | ICD-10-CM | POA: Diagnosis present

## 2016-01-15 DIAGNOSIS — C786 Secondary malignant neoplasm of retroperitoneum and peritoneum: Secondary | ICD-10-CM | POA: Insufficient documentation

## 2016-01-15 MED ORDER — IOPAMIDOL (ISOVUE-300) INJECTION 61%
100.0000 mL | Freq: Once | INTRAVENOUS | Status: AC | PRN
Start: 2016-01-15 — End: 2016-01-15
  Administered 2016-01-15: 100 mL via INTRAVENOUS

## 2016-01-22 ENCOUNTER — Other Ambulatory Visit (HOSPITAL_BASED_OUTPATIENT_CLINIC_OR_DEPARTMENT_OTHER): Payer: 59

## 2016-01-22 ENCOUNTER — Ambulatory Visit: Payer: 59

## 2016-01-22 ENCOUNTER — Ambulatory Visit (HOSPITAL_BASED_OUTPATIENT_CLINIC_OR_DEPARTMENT_OTHER): Payer: 59

## 2016-01-22 ENCOUNTER — Other Ambulatory Visit: Payer: 59

## 2016-01-22 VITALS — BP 155/90 | HR 80 | Temp 97.6°F | Resp 20

## 2016-01-22 DIAGNOSIS — C7A012 Malignant carcinoid tumor of the ileum: Secondary | ICD-10-CM | POA: Diagnosis not present

## 2016-01-22 DIAGNOSIS — C7B02 Secondary carcinoid tumors of liver: Secondary | ICD-10-CM

## 2016-01-22 DIAGNOSIS — D3A012 Benign carcinoid tumor of the ileum: Secondary | ICD-10-CM

## 2016-01-22 DIAGNOSIS — C7B09 Secondary carcinoid tumors of other sites: Secondary | ICD-10-CM

## 2016-01-22 DIAGNOSIS — C7B04 Secondary carcinoid tumors of peritoneum: Secondary | ICD-10-CM

## 2016-01-22 LAB — CBC WITH DIFFERENTIAL/PLATELET
BASO%: 1.3 % (ref 0.0–2.0)
Basophils Absolute: 0.1 10*3/uL (ref 0.0–0.1)
EOS%: 0.7 % (ref 0.0–7.0)
Eosinophils Absolute: 0 10*3/uL (ref 0.0–0.5)
HEMATOCRIT: 44.8 % (ref 34.8–46.6)
HEMOGLOBIN: 14.8 g/dL (ref 11.6–15.9)
LYMPH#: 1.5 10*3/uL (ref 0.9–3.3)
LYMPH%: 34.5 % (ref 14.0–49.7)
MCH: 30 pg (ref 25.1–34.0)
MCHC: 33 g/dL (ref 31.5–36.0)
MCV: 91 fL (ref 79.5–101.0)
MONO#: 0.2 10*3/uL (ref 0.1–0.9)
MONO%: 3.7 % (ref 0.0–14.0)
NEUT#: 2.5 10*3/uL (ref 1.5–6.5)
NEUT%: 59.8 % (ref 38.4–76.8)
PLATELETS: 210 10*3/uL (ref 145–400)
RBC: 4.92 10*6/uL (ref 3.70–5.45)
RDW: 12.6 % (ref 11.2–14.5)
WBC: 4.3 10*3/uL (ref 3.9–10.3)

## 2016-01-22 LAB — COMPREHENSIVE METABOLIC PANEL
ALBUMIN: 4.3 g/dL (ref 3.5–5.0)
ALK PHOS: 76 U/L (ref 40–150)
ALT: 64 U/L — AB (ref 0–55)
ANION GAP: 12 meq/L — AB (ref 3–11)
AST: 62 U/L — ABNORMAL HIGH (ref 5–34)
BILIRUBIN TOTAL: 0.96 mg/dL (ref 0.20–1.20)
BUN: 15 mg/dL (ref 7.0–26.0)
CALCIUM: 9.7 mg/dL (ref 8.4–10.4)
CHLORIDE: 106 meq/L (ref 98–109)
CO2: 24 mEq/L (ref 22–29)
CREATININE: 1 mg/dL (ref 0.6–1.1)
EGFR: 65 mL/min/{1.73_m2} — ABNORMAL LOW (ref >90)
Glucose: 159 mg/dl — ABNORMAL HIGH (ref 70–140)
Potassium: 3.5 mEq/L (ref 3.5–5.1)
Sodium: 142 mEq/L (ref 136–145)
TOTAL PROTEIN: 8.2 g/dL (ref 6.4–8.3)

## 2016-01-22 MED ORDER — OCTREOTIDE ACETATE 30 MG IM KIT
30.0000 mg | PACK | Freq: Once | INTRAMUSCULAR | Status: AC
  Administered 2016-01-22: 30 mg via INTRAMUSCULAR
  Filled 2016-01-22: qty 1

## 2016-01-22 NOTE — Patient Instructions (Signed)

## 2016-01-24 LAB — CHROMOGRANIN A: CHROMOGRAN A: 4 nmol/L (ref 0–5)

## 2016-01-25 LAB — CHROMOGRANIN A (PARALLEL TESTING): CHROMOGRANIN A: 14 ng/mL (ref <=15)

## 2016-02-05 ENCOUNTER — Other Ambulatory Visit: Payer: Self-pay | Admitting: *Deleted

## 2016-02-05 ENCOUNTER — Encounter: Payer: 59 | Admitting: Hematology

## 2016-02-05 NOTE — Progress Notes (Signed)
This encounter was created in error - please disregard.

## 2016-02-08 ENCOUNTER — Ambulatory Visit (HOSPITAL_BASED_OUTPATIENT_CLINIC_OR_DEPARTMENT_OTHER): Payer: 59 | Admitting: Hematology

## 2016-02-08 ENCOUNTER — Telehealth: Payer: Self-pay | Admitting: Hematology

## 2016-02-08 ENCOUNTER — Encounter: Payer: Self-pay | Admitting: Hematology

## 2016-02-08 VITALS — BP 165/72 | HR 81 | Temp 97.9°F | Resp 18 | Ht 62.0 in | Wt 132.2 lb

## 2016-02-08 DIAGNOSIS — R197 Diarrhea, unspecified: Secondary | ICD-10-CM

## 2016-02-08 DIAGNOSIS — C7B09 Secondary carcinoid tumors of other sites: Secondary | ICD-10-CM

## 2016-02-08 DIAGNOSIS — C7A012 Malignant carcinoid tumor of the ileum: Secondary | ICD-10-CM | POA: Diagnosis not present

## 2016-02-08 DIAGNOSIS — C7B02 Secondary carcinoid tumors of liver: Secondary | ICD-10-CM

## 2016-02-08 DIAGNOSIS — E876 Hypokalemia: Secondary | ICD-10-CM

## 2016-02-08 DIAGNOSIS — I10 Essential (primary) hypertension: Secondary | ICD-10-CM

## 2016-02-08 DIAGNOSIS — C7B04 Secondary carcinoid tumors of peritoneum: Secondary | ICD-10-CM

## 2016-02-08 NOTE — Progress Notes (Signed)
[place] OFFICE PROGRESS NOTE  Patient Care Team: [name], PA-C as PCP - General (Physician Assistant)  DIAGNOSIS: 1. 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  2. 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 The patient underwent surgical resection of her carcinoid tumor on 08/17/2005. Two out of 13 lymph nodes were positive. The patient's stage was III. The patient was noted to have an elevated urinary 5HIAA level in July 2011. A positive octreotide scan was noted coming from the right lower pelvis adjacent to the bladder on 09/06/2010. Initially this was felt to be a uterine fibroid. The patient has continued to have positive octreotide scan on 11/07/2011 and 05/06/2012. CT scans of the abdomen and pelvis were also carried out on 03/02/2010, 02/24/2012 and 06/30/2012. These scans in conjunction with an elevated 5HIAA level of 22.7 on 04/13/2012 and a chromogranin A level which had risen to 18.0 on 03/23/2012 strongly suggested that the patient's disease had recurred. The patient required admission to the hospital from 06/30/2012 through 07/03/2012 for an episode of upper abdominal pain which occurred suddenly the day prior to admission. The patient was started on somatostatin LAR 30 mg IM on 07/07/2012 for recurrent disease involving the lateral segment of the left hepatic lobe, a right jejunal mesenteric node and 2 enhancing masses adjacent to the uterus. Twenty-four-hour urine 5HIAA on 10/19/2012 was 5.5. CT scan of the abdomen and pelvis with IV contrast on 10/19/2012 showed generally stable disease with the exception of a decrease in the right abdominal mesenteric lymphadenopathy. There have been 2 lesions in the liver which are felt to be stable. One lesion is in the lateral segment of the  left hepatic lobe measuring 14 x 14 mm. The second lesion is seen in the lateral segment adjacent to the falciform ligament and measures 9 mm.  2. Uterine fibroids noted on imaging studies dating back to late April 2010.  3. Elevated liver transaminases noted in March 2007. The patient was evaluated by Dr. Delfin Edis in October 2010 and felt that the etiology was hepatic steatosis.  4. Admission to the hospital for upper abdominal pain with sudden onset. Admission dates were 06/30/2012 through 07/03/2012. CT scan of the abdomen and pelvis with IV contrast on 06/30/2012 showed no definite etiology for the pain. It was noted that a lymph node in the right jejunal mesentery had developed central necrosis when compared with the prior study of 02/24/2012. There was no evidence for bowel obstruction. There has previously been no evidence for gallstones on recent CT scans, and an abdominal ultrasound dating back to 05/08/2009.  5. Possible mass involving the left breast on screening mammogram from 06/18/2012. A diagnostic mammogram and ultrasound of the left breast carried out on 07/17/2012 were normal. Followup screening mammogram was suggested in 1 year.  6. Diarrhea with onset in December 2013, somewhat improved with Imodium.   CURRENT THERAPY:  Sandostatin LAR 30 mg IM started on 07/07/2012   INTERVAL HISTORY: [name] returns for follow up. She is doing well overall. No new complaints. She still has occasional diarrhea, she usually has bowel movement once or twice a day, but sometime she has 3 or 4, depends on what she eats. She use Imodium about twice a week. It's not bothersome. She denies any other symptoms, no dysuria, flushing, or  pain. She has good appetite and energy level. She works full-time. Her weight is stable.  REVIEW OF SYSTEMS:   Constitutional: Denies fevers, chills or abnormal weight loss Eyes: Denies blurriness of vision Ears, nose, mouth, throat, and face: Denies mucositis  or sore throat Respiratory: Denies cough, dyspnea or wheezes Cardiovascular: Denies palpitation, chest discomfort or lower extremity swelling Gastrointestinal:  Denies nausea, heartburn or change in bowel habits Skin: Denies abnormal skin rashes Lymphatics: Denies new lymphadenopathy or easy bruising Neurological:Denies numbness, tingling or new weaknesses Behavioral/Psych: Mood is stable, no new changes  All other systems were reviewed with the patient and are negative.  MEDICAL HISTORY:  Past Medical History:  Diagnosis Date  . 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 carcinoid tumor dx'd 08/2005  . Diverticulosis   . Hepatic steatosis 02/24/12  . Hypopotassemia   . 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 ([place])    liver,mesentery,pelvis  . Nephrolithiasis   . Nonspecific elevation of levels of transaminase or lactic acid dehydrogenase (LDH)   . Small bowel obstruction ([place])   . Uterine fibroid     SURGICAL HISTORY: Past Surgical History:  Procedure Laterality Date  . carcinoid tumor resection     ileum  . TUBAL LIGATION      I have reviewed the social history and family history with the patient and they are unchanged from previous note.  ALLERGIES:  is allergic to cephalexin and amoxicillin.  MEDICATIONS:  Current Outpatient Prescriptions  Medication Sig Dispense Refill  . ascorbic acid (VITAMIN C) 500 MG tablet Take 500 mg by mouth daily.    . Calcium Carbonate-Vitamin D (CALCIUM 600-D PO) Take 1 tablet by mouth daily.    . Cholecalciferol (VITAMIN D-3) 5000 UNITS TABS Take 1 tablet by mouth daily.    [name] loperamide (IMODIUM) 2 MG capsule Take by mouth as needed for diarrhea or loose stools.    . Multiple Vitamin (MULTIVITAMIN) tablet Take 1 tablet by mouth daily.    [name] octreotide (SANDOSTATIN LAR) 30 MG injection Inject 30 mg into the muscle every 28 (twenty-eight) days.    . potassium chloride SA (K-DUR,KLOR-CON) 20 MEQ tablet Take 1 tablet by mouth   daily (Patient taking differently: Take 1 tablet by mouth  every other day.) 90 tablet 0   No current facility-administered medications for this visit.     PHYSICAL EXAMINATION: ECOG PERFORMANCE STATUS: 0 - Asymptomatic  Vitals:   02/08/16 0820  BP: (!) 165/72  Pulse: 81  Resp: 18  Temp: 97.9 F (36.6 C)   Filed Weights   02/08/16 0820  Weight: 132 lb 3.2 oz (60 kg)    GENERAL:alert, no distress and comfortable SKIN: skin color, texture, turgor are normal, no rashes or significant lesions EYES: normal, Conjunctiva are pink and non-injected, sclera clear OROPHARYNX:no exudate, no erythema and lips, buccal mucosa, and tongue normal  NECK: supple, thyroid normal size, non-tender, without nodularity LYMPH:  no palpable lymphadenopathy in the cervical, axillary or inguinal LUNGS: clear to auscultation and percussion with normal breathing effort HEART: regular rate & rhythm and no murmurs and no lower extremity edema ABDOMEN:abdomen soft, non-tender and normal bowel sounds Musculoskeletal:no cyanosis of digits and no clubbing  NEURO: alert & oriented x 3 with fluent speech, no focal motor/sensory deficits  LABORATORY DATA:  I have reviewed the data as listed CBC Latest Ref Rng & Units 01/22/2016 10/30/2015 07/11/2015  WBC 3.9 - 10.3 10e3/uL 4.3 4.8 4.1  Hemoglobin 11.6 - 15.9 g/dL 14.8 14.8  14.7  Hematocrit 34.8 - 46.6 % 44.8 45.0 44.0  Platelets 145 - 400 10e3/uL 210 223 233    CMP Latest Ref Rng & Units 01/22/2016 10/30/2015 07/11/2015  Glucose 70 - 140 mg/dl 159(H) 113 100  BUN 7.0 - 26.0 mg/dL 15.0 10.9 10.0  Creatinine 0.6 - 1.1 mg/dL 1.0 0.9 0.9  Sodium 136 - 145 mEq/L 142 144 142  Potassium 3.5 - 5.1 mEq/L 3.5 3.7 4.1  Chloride 98 - 107 mEq/L - - -  CO2 22 - 29 mEq/L 24 28 28   Calcium 8.4 - 10.4 mg/dL 9.7 10.0 9.9  Total Protein 6.4 - 8.3 g/dL 8.2 8.1 8.1  Total Bilirubin 0.20 - 1.20 mg/dL 0.96 0.84 0.91  Alkaline Phos 40 - 150 U/L 76 70 73  AST 5 - 34 U/L 62(H)  75(H) 65(H)  ALT 0 - 55 U/L 64(H) 75(H) 70(H)   Chromogranin A  Order: [id number]  Status:  Final result Visible to patient:  Yes (MyChart) Next appt:  Today at 08:30 AM in [facility], Krista Blue, MD) Dx:  Metastatic carcinoid tumor to intra-a...    Ref Range & Units 2wk ago 83mo ago   Chromogranin A 0 - 5 nmol/L 4 4CM       5 HIAA, quantitative, urine, 24 hour  Order: [id number]  Status:  Final result Visible to patient:  Yes (MyChart) Next appt:  Today at 08:30 AM in [facility], Krista Blue, MD)    Ref Range & Units 4mo ago (09/04/15) 51mo ago (05/22/15) 24mo ago (03/13/15) 50yr ago (01/23/15) 40yr ago (11/21/14)   5-HIAA, Urine Undefined mg/L 3.5 <=6.0 mg/24 h" class="z1wa hlt1024">5.2R <=6.0 mg/24 h" class="z1wa hlt1024">5.7R <=6.0 mg/24 h" class="z1wa hlt1024">2.6R <=6.0 mg/24 h" class="z1wb hlt1024">9.0R   Comments: Total Volume: 1500 mL   5-HIAA,Quant.,24 Hr Urine 0.0 - 14.9 mg/24 hr 5.3              RADIOGRAPHIC STUDIES:  CT chest, abdomen and pelvis 01/15/2016 IMPRESSION: 1. Continued stability of multifocal metastatic disease involving the liver, peritoneum and bilateral adnexa. No disease progression identified. 2. No significant thoracic findings. 3. Hepatic steatosis. 4.  Aortic Atherosclerosis (ICD10-170.0)    ASSESSMENT & PLAN:  62-yo female with   1. Metastatic carcinoid tumor of ileum to liver, peritoneum and adnexa -We reviewed the natural history of metastatic carcinoid tumor. She understands this is an incurable disease, and the goal of therapy is disease control and prolong her life. -We also reviewed the overall treatment strategy for metastatic carcinoid tumor, she understands her disease will probably progress at some point, and we'll move on to second or third line treatment, such as underlying Everolimus or chemotherapy. -We also discussed the role of liver targeted therapy, and new treatment option 177 Lu-Dotatate in the future if needed  -She has been doing  very well clinically, chromogranin A and urine 5 HIAA level are mostly normal. -lab result from last week reviewed with her. -We reviewed her restaging CT scan from last week, which showed stable disease, no new lesions. -She is clinically doing very well, occasional diarrhea, no other symptoms.   -continue Sandostatin injection q4w.  -restaging CT in 6 months   2. Hypokalemia -Probably related to her diarrhea, K 3.5 last week  -She is on potassium 1 tablet every other day, we'll continue   3. HTN -Her blood pressure was elevated today. I encouraged her to monitor her blood pressure home. -Follow-up with primary care physician  4. Cancer screening -She is overdue for  mammogram, last mammogram in July 2015. She will contact her primary care physician and get it scheduled.  She will continue follow up with her PCP and other specialists.   Follow-up: Continue Sandostatin every 4 weeks. Labs every 3 months. I'll see her back in 6 months with restaging CT   All questions were answered. The patient knows to call the clinic with any problems, questions or concerns. No barriers to learning was detected.  I spent 20 minutes counseling the patient face to face. The total time spent in the appointment was 30 minutes and more than 50% was on counseling and review of test results     Truitt Merle, MD 02/08/2016

## 2016-02-08 NOTE — Telephone Encounter (Signed)
per pof to sch pt appt-gave pt copy of avs/calendar

## 2016-02-09 ENCOUNTER — Telehealth: Payer: Self-pay | Admitting: Hematology

## 2016-02-09 ENCOUNTER — Other Ambulatory Visit: Payer: Self-pay | Admitting: *Deleted

## 2016-02-09 NOTE — Telephone Encounter (Signed)
cld & spoke to pt and gave pt tiem & date of next appt-8/7 @8 :15-pt to get updated copy @ appt

## 2016-02-12 ENCOUNTER — Encounter: Payer: Self-pay | Admitting: Hematology

## 2016-02-12 ENCOUNTER — Ambulatory Visit: Payer: 59

## 2016-02-19 ENCOUNTER — Ambulatory Visit (HOSPITAL_BASED_OUTPATIENT_CLINIC_OR_DEPARTMENT_OTHER): Payer: 59

## 2016-02-19 VITALS — BP 150/88 | HR 98 | Temp 98.3°F | Resp 20

## 2016-02-19 DIAGNOSIS — C7B02 Secondary carcinoid tumors of liver: Secondary | ICD-10-CM

## 2016-02-19 DIAGNOSIS — C7B04 Secondary carcinoid tumors of peritoneum: Secondary | ICD-10-CM

## 2016-02-19 DIAGNOSIS — C7A012 Malignant carcinoid tumor of the ileum: Secondary | ICD-10-CM

## 2016-02-19 DIAGNOSIS — C7B09 Secondary carcinoid tumors of other sites: Secondary | ICD-10-CM

## 2016-02-19 DIAGNOSIS — D3A012 Benign carcinoid tumor of the ileum: Secondary | ICD-10-CM

## 2016-02-19 MED ORDER — OCTREOTIDE ACETATE 30 MG IM KIT
30.0000 mg | PACK | Freq: Once | INTRAMUSCULAR | Status: AC
  Administered 2016-02-19: 30 mg via INTRAMUSCULAR
  Filled 2016-02-19: qty 1

## 2016-02-19 NOTE — Patient Instructions (Signed)

## 2016-02-22 LAB — 5 HIAA, QUANTITATIVE, URINE, 24 HOUR
5-HIAA, Urine: 4 mg/L
5-HIAA,Quant.,24 Hr Urine: 6 mg/24 hr (ref 0.0–14.9)

## 2016-03-01 ENCOUNTER — Telehealth: Payer: Self-pay | Admitting: Hematology

## 2016-03-01 NOTE — Telephone Encounter (Signed)
left msg confirming 8/22 echo, sent msg for authorization on 7/28 and did not recieve a response unti 8/15. so  echo sched for next available .. 8/22 @ 10

## 2016-03-05 ENCOUNTER — Other Ambulatory Visit (HOSPITAL_COMMUNITY): Payer: 59

## 2016-03-11 ENCOUNTER — Ambulatory Visit: Payer: 59

## 2016-03-12 ENCOUNTER — Encounter: Payer: Self-pay | Admitting: Hematology

## 2016-03-19 ENCOUNTER — Ambulatory Visit (HOSPITAL_BASED_OUTPATIENT_CLINIC_OR_DEPARTMENT_OTHER): Payer: 59

## 2016-03-19 VITALS — BP 157/83 | HR 96 | Temp 98.1°F | Resp 18

## 2016-03-19 DIAGNOSIS — C7B09 Secondary carcinoid tumors of other sites: Secondary | ICD-10-CM

## 2016-03-19 DIAGNOSIS — D3A012 Benign carcinoid tumor of the ileum: Secondary | ICD-10-CM

## 2016-03-19 DIAGNOSIS — C7A012 Malignant carcinoid tumor of the ileum: Secondary | ICD-10-CM

## 2016-03-19 DIAGNOSIS — C7B02 Secondary carcinoid tumors of liver: Secondary | ICD-10-CM | POA: Diagnosis not present

## 2016-03-19 DIAGNOSIS — E340 Carcinoid syndrome: Secondary | ICD-10-CM

## 2016-03-19 MED ORDER — OCTREOTIDE ACETATE 30 MG IM KIT
30.0000 mg | PACK | Freq: Once | INTRAMUSCULAR | Status: AC
  Administered 2016-03-19: 30 mg via INTRAMUSCULAR
  Filled 2016-03-19: qty 1

## 2016-03-19 NOTE — Patient Instructions (Signed)

## 2016-04-08 ENCOUNTER — Ambulatory Visit: Payer: 59

## 2016-04-08 ENCOUNTER — Ambulatory Visit (HOSPITAL_COMMUNITY)
Admission: RE | Admit: 2016-04-08 | Discharge: 2016-04-08 | Disposition: A | Discharge status: Home / Self Care | Payer: 59 | Source: Ambulatory Visit | Attending: Hematology | Admitting: Hematology

## 2016-04-08 DIAGNOSIS — C7B09 Secondary carcinoid tumors of other sites: Secondary | ICD-10-CM | POA: Insufficient documentation

## 2016-04-08 NOTE — Progress Notes (Signed)
*  PRELIMINARY RESULTS* Echocardiogram 2D Echocardiogram has been performed.  Vanessa Waller 04/08/2016, 10:29 AM

## 2016-04-15 ENCOUNTER — Other Ambulatory Visit (HOSPITAL_BASED_OUTPATIENT_CLINIC_OR_DEPARTMENT_OTHER): Payer: 59

## 2016-04-15 ENCOUNTER — Ambulatory Visit (HOSPITAL_BASED_OUTPATIENT_CLINIC_OR_DEPARTMENT_OTHER): Payer: 59

## 2016-04-15 VITALS — BP 141/81 | HR 88 | Temp 98.2°F | Resp 18

## 2016-04-15 DIAGNOSIS — C7B02 Secondary carcinoid tumors of liver: Secondary | ICD-10-CM

## 2016-04-15 DIAGNOSIS — C7B09 Secondary carcinoid tumors of other sites: Secondary | ICD-10-CM

## 2016-04-15 DIAGNOSIS — D3A012 Benign carcinoid tumor of the ileum: Secondary | ICD-10-CM

## 2016-04-15 DIAGNOSIS — C7B04 Secondary carcinoid tumors of peritoneum: Secondary | ICD-10-CM

## 2016-04-15 DIAGNOSIS — C7A012 Malignant carcinoid tumor of the ileum: Secondary | ICD-10-CM | POA: Diagnosis not present

## 2016-04-15 LAB — CBC WITH DIFFERENTIAL/PLATELET
BASO%: 1.2 % (ref 0.0–2.0)
BASOS ABS: 0.1 10*3/uL (ref 0.0–0.1)
EOS%: 0.4 % (ref 0.0–7.0)
Eosinophils Absolute: 0 10*3/uL (ref 0.0–0.5)
HEMATOCRIT: 45.6 % (ref 34.8–46.6)
HEMOGLOBIN: 15 g/dL (ref 11.6–15.9)
LYMPH#: 1.7 10*3/uL (ref 0.9–3.3)
LYMPH%: 30.2 % (ref 14.0–49.7)
MCH: 29.9 pg (ref 25.1–34.0)
MCHC: 32.8 g/dL (ref 31.5–36.0)
MCV: 90.9 fL (ref 79.5–101.0)
MONO#: 0.3 10*3/uL (ref 0.1–0.9)
MONO%: 5.4 % (ref 0.0–14.0)
NEUT#: 3.6 10*3/uL (ref 1.5–6.5)
NEUT%: 62.8 % (ref 38.4–76.8)
Platelets: 245 10*3/uL (ref 145–400)
RBC: 5.02 10*6/uL (ref 3.70–5.45)
RDW: 12.4 % (ref 11.2–14.5)
WBC: 5.8 10*3/uL (ref 3.9–10.3)

## 2016-04-15 LAB — COMPREHENSIVE METABOLIC PANEL
ALBUMIN: 4 g/dL (ref 3.5–5.0)
ALK PHOS: 71 U/L (ref 40–150)
ALT: 60 U/L — AB (ref 0–55)
AST: 60 U/L — AB (ref 5–34)
Anion Gap: 11 mEq/L (ref 3–11)
BUN: 12.2 mg/dL (ref 7.0–26.0)
CALCIUM: 9.6 mg/dL (ref 8.4–10.4)
CHLORIDE: 106 meq/L (ref 98–109)
CO2: 27 mEq/L (ref 22–29)
CREATININE: 0.8 mg/dL (ref 0.6–1.1)
EGFR: 76 mL/min/{1.73_m2} — ABNORMAL LOW (ref >90)
Glucose: 99 mg/dl (ref 70–140)
Potassium: 3.5 mEq/L (ref 3.5–5.1)
Sodium: 143 mEq/L (ref 136–145)
Total Bilirubin: 0.77 mg/dL (ref 0.20–1.20)
Total Protein: 8.1 g/dL (ref 6.4–8.3)

## 2016-04-15 MED ORDER — OCTREOTIDE ACETATE 30 MG IM KIT
30.0000 mg | PACK | Freq: Once | INTRAMUSCULAR | Status: AC
Start: 2016-04-15 — End: 2016-04-15
  Administered 2016-04-15: 30 mg via INTRAMUSCULAR
  Filled 2016-04-15: qty 1

## 2016-04-15 NOTE — Patient Instructions (Signed)

## 2016-04-16 ENCOUNTER — Ambulatory Visit: Payer: 59

## 2016-04-17 LAB — CHROMOGRANIN A: CHROMOGRAN A: 4 nmol/L (ref 0–5)

## 2016-05-06 ENCOUNTER — Other Ambulatory Visit: Payer: 59

## 2016-05-06 ENCOUNTER — Ambulatory Visit: Payer: 59

## 2016-05-13 ENCOUNTER — Ambulatory Visit (HOSPITAL_BASED_OUTPATIENT_CLINIC_OR_DEPARTMENT_OTHER): Payer: 59

## 2016-05-13 VITALS — BP 160/77 | HR 82 | Temp 97.8°F | Resp 20

## 2016-05-13 DIAGNOSIS — C7B02 Secondary carcinoid tumors of liver: Secondary | ICD-10-CM

## 2016-05-13 DIAGNOSIS — D3A012 Benign carcinoid tumor of the ileum: Secondary | ICD-10-CM

## 2016-05-13 DIAGNOSIS — C7B04 Secondary carcinoid tumors of peritoneum: Secondary | ICD-10-CM

## 2016-05-13 DIAGNOSIS — C7A012 Malignant carcinoid tumor of the ileum: Secondary | ICD-10-CM

## 2016-05-13 DIAGNOSIS — C7B09 Secondary carcinoid tumors of other sites: Secondary | ICD-10-CM

## 2016-05-13 MED ORDER — OCTREOTIDE ACETATE 30 MG IM KIT
30.0000 mg | PACK | Freq: Once | INTRAMUSCULAR | Status: AC
  Administered 2016-05-13: 30 mg via INTRAMUSCULAR
  Filled 2016-05-13: qty 1

## 2016-05-13 NOTE — Patient Instructions (Signed)

## 2016-05-14 ENCOUNTER — Ambulatory Visit: Payer: 59

## 2016-06-03 ENCOUNTER — Ambulatory Visit: Payer: 59

## 2016-06-03 ENCOUNTER — Telehealth: Payer: Self-pay | Admitting: *Deleted

## 2016-06-03 NOTE — Telephone Encounter (Signed)
Pt called requesting a lab appt for Mon 06/10/16 prior to scan appt on Dec. 4.   Left message on voice mail of lab appt at 0830 am after injection appt on 06/10/16.

## 2016-06-10 ENCOUNTER — Ambulatory Visit (HOSPITAL_BASED_OUTPATIENT_CLINIC_OR_DEPARTMENT_OTHER): Payer: 59

## 2016-06-10 ENCOUNTER — Other Ambulatory Visit (HOSPITAL_BASED_OUTPATIENT_CLINIC_OR_DEPARTMENT_OTHER): Payer: 59

## 2016-06-10 VITALS — BP 151/81 | HR 91 | Temp 98.0°F | Resp 20

## 2016-06-10 DIAGNOSIS — C7A012 Malignant carcinoid tumor of the ileum: Secondary | ICD-10-CM | POA: Diagnosis not present

## 2016-06-10 DIAGNOSIS — D3A012 Benign carcinoid tumor of the ileum: Secondary | ICD-10-CM

## 2016-06-10 DIAGNOSIS — C7B02 Secondary carcinoid tumors of liver: Secondary | ICD-10-CM

## 2016-06-10 DIAGNOSIS — C7B04 Secondary carcinoid tumors of peritoneum: Secondary | ICD-10-CM

## 2016-06-10 DIAGNOSIS — C7B09 Secondary carcinoid tumors of other sites: Secondary | ICD-10-CM

## 2016-06-10 LAB — CBC WITH DIFFERENTIAL/PLATELET
BASO%: 1.2 % (ref 0.0–2.0)
Basophils Absolute: 0.1 10*3/uL (ref 0.0–0.1)
EOS ABS: 0 10*3/uL (ref 0.0–0.5)
EOS%: 0.4 % (ref 0.0–7.0)
HCT: 45.7 % (ref 34.8–46.6)
HEMOGLOBIN: 15 g/dL (ref 11.6–15.9)
LYMPH%: 32.1 % (ref 14.0–49.7)
MCH: 29.8 pg (ref 25.1–34.0)
MCHC: 32.9 g/dL (ref 31.5–36.0)
MCV: 90.6 fL (ref 79.5–101.0)
MONO#: 0.3 10*3/uL (ref 0.1–0.9)
MONO%: 6.2 % (ref 0.0–14.0)
NEUT%: 60.1 % (ref 38.4–76.8)
NEUTROS ABS: 2.7 10*3/uL (ref 1.5–6.5)
Platelets: 244 10*3/uL (ref 145–400)
RBC: 5.05 10*6/uL (ref 3.70–5.45)
RDW: 12.4 % (ref 11.2–14.5)
WBC: 4.6 10*3/uL (ref 3.9–10.3)
lymph#: 1.5 10*3/uL (ref 0.9–3.3)

## 2016-06-10 LAB — COMPREHENSIVE METABOLIC PANEL
ALT: 59 U/L — ABNORMAL HIGH (ref 0–55)
AST: 59 U/L — AB (ref 5–34)
Albumin: 4.1 g/dL (ref 3.5–5.0)
Alkaline Phosphatase: 75 U/L (ref 40–150)
Anion Gap: 11 mEq/L (ref 3–11)
BILIRUBIN TOTAL: 0.78 mg/dL (ref 0.20–1.20)
BUN: 11.9 mg/dL (ref 7.0–26.0)
CO2: 28 meq/L (ref 22–29)
Calcium: 10.1 mg/dL (ref 8.4–10.4)
Chloride: 103 mEq/L (ref 98–109)
Creatinine: 0.9 mg/dL (ref 0.6–1.1)
EGFR: 72 mL/min/{1.73_m2} — AB (ref >90)
GLUCOSE: 93 mg/dL (ref 70–140)
POTASSIUM: 3.7 meq/L (ref 3.5–5.1)
SODIUM: 142 meq/L (ref 136–145)
TOTAL PROTEIN: 8.1 g/dL (ref 6.4–8.3)

## 2016-06-10 MED ORDER — OCTREOTIDE ACETATE 30 MG IM KIT
30.0000 mg | PACK | Freq: Once | INTRAMUSCULAR | Status: AC
  Administered 2016-06-10: 30 mg via INTRAMUSCULAR
  Filled 2016-06-10: qty 1

## 2016-06-10 NOTE — Patient Instructions (Signed)

## 2016-06-11 ENCOUNTER — Ambulatory Visit: Payer: 59

## 2016-06-11 LAB — CHROMOGRANIN A: CHROMOGRAN A: 5 nmol/L (ref 0–5)

## 2016-06-17 ENCOUNTER — Ambulatory Visit (HOSPITAL_COMMUNITY)
Admission: RE | Admit: 2016-06-17 | Discharge: 2016-06-17 | Disposition: A | Discharge status: Home / Self Care | Payer: 59 | Source: Ambulatory Visit | Attending: Hematology | Admitting: Hematology

## 2016-06-17 ENCOUNTER — Encounter (HOSPITAL_COMMUNITY): Payer: Self-pay

## 2016-06-17 DIAGNOSIS — K760 Fatty (change of) liver, not elsewhere classified: Secondary | ICD-10-CM | POA: Insufficient documentation

## 2016-06-17 DIAGNOSIS — I700 Atherosclerosis of aorta: Secondary | ICD-10-CM | POA: Diagnosis not present

## 2016-06-17 DIAGNOSIS — C7A00 Malignant carcinoid tumor of unspecified site: Secondary | ICD-10-CM | POA: Insufficient documentation

## 2016-06-17 DIAGNOSIS — C7B02 Secondary carcinoid tumors of liver: Secondary | ICD-10-CM | POA: Diagnosis not present

## 2016-06-17 DIAGNOSIS — C7B09 Secondary carcinoid tumors of other sites: Secondary | ICD-10-CM | POA: Diagnosis present

## 2016-06-17 MED ORDER — SODIUM CHLORIDE 0.9 % IJ SOLN
INTRAMUSCULAR | Status: AC
  Filled 2016-06-17: qty 50

## 2016-06-17 MED ORDER — IOPAMIDOL (ISOVUE-300) INJECTION 61%
INTRAVENOUS | Status: AC
  Filled 2016-06-17: qty 100

## 2016-06-17 MED ORDER — IOPAMIDOL (ISOVUE-300) INJECTION 61%
100.0000 mL | Freq: Once | INTRAVENOUS | Status: AC | PRN
  Administered 2016-06-17: 100 mL via INTRAVENOUS

## 2016-06-20 LAB — 5 HIAA, QUANTITATIVE, URINE, 24 HOUR
5-HIAA, URINE: 5.1 mg/L
5-HIAA,QUANT.,24 HR URINE: 5.1 mg/(24.h) (ref 0.0–14.9)

## 2016-07-01 ENCOUNTER — Ambulatory Visit: Payer: 59

## 2016-07-09 ENCOUNTER — Ambulatory Visit (HOSPITAL_BASED_OUTPATIENT_CLINIC_OR_DEPARTMENT_OTHER): Payer: 59

## 2016-07-09 ENCOUNTER — Ambulatory Visit: Payer: 59

## 2016-07-09 VITALS — BP 147/78 | HR 98 | Temp 98.0°F | Resp 20

## 2016-07-09 DIAGNOSIS — C7B02 Secondary carcinoid tumors of liver: Secondary | ICD-10-CM | POA: Diagnosis not present

## 2016-07-09 DIAGNOSIS — C7B09 Secondary carcinoid tumors of other sites: Secondary | ICD-10-CM

## 2016-07-09 DIAGNOSIS — C7A012 Malignant carcinoid tumor of the ileum: Secondary | ICD-10-CM

## 2016-07-09 DIAGNOSIS — D3A012 Benign carcinoid tumor of the ileum: Secondary | ICD-10-CM

## 2016-07-09 MED ORDER — OCTREOTIDE ACETATE 30 MG IM KIT
30.0000 mg | PACK | Freq: Once | INTRAMUSCULAR | Status: AC
  Administered 2016-07-09: 30 mg via INTRAMUSCULAR
  Filled 2016-07-09: qty 1

## 2016-07-09 NOTE — Patient Instructions (Signed)
Octreotide injection solution °What is this medicine? °OCTREOTIDE (ok TREE oh tide) is used to reduce blood levels of growth hormone in patients with a condition called acromegaly. This medicine also reduces flushing and watery diarrhea caused by certain types of cancer. °This medicine may be used for other purposes; ask your health care provider or pharmacist if you have questions. °COMMON BRAND NAME(S): Sandostatin, Sandostatin LAR °What should I tell my health care provider before I take this medicine? °They need to know if you have any of these conditions: °-gallbladder disease °-kidney disease °-liver disease °-an unusual or allergic reaction to octreotide, other medicines, foods, dyes, or preservatives °-pregnant or trying to get pregnant °-breast-feeding °How should I use this medicine? °This medicine is for injection under the skin or into a vein (only in emergency situations). It is usually given by a health care professional in a hospital or clinic setting. °If you get this medicine at home, you will be taught how to prepare and give this medicine. Allow the injection solution to come to room temperature before use. Do not warm it artificially. Use exactly as directed. Take your medicine at regular intervals. Do not take your medicine more often than directed. °It is important that you put your used needles and syringes in a special sharps container. Do not put them in a trash can. If you do not have a sharps container, call your pharmacist or healthcare provider to get one. °Talk to your pediatrician regarding the use of this medicine in children. Special care may be needed. °Overdosage: If you think you have taken too much of this medicine contact a poison control center or emergency room at once. °NOTE: This medicine is only for you. Do not share this medicine with others. °What if I miss a dose? °If you miss a dose, take it as soon as you can. If it is almost time for your next dose, take only that  dose. Do not take double or extra doses. °What may interact with this medicine? °Do not take this medicine with any of the following medications: °-cisapride °-droperidol °-general anesthetics °-grepafloxacin °-perphenazine °-thioridazine °This medicine may also interact with the following medications: °-bromocriptine °-cyclosporine °-diuretics °-medicines for blood pressure, heart disease, irregular heart beat °-medicines for diabetes, including insulin °-quinidine °This list may not describe all possible interactions. Give your health care provider a list of all the medicines, herbs, non-prescription drugs, or dietary supplements you use. Also tell them if you smoke, drink alcohol, or use illegal drugs. Some items may interact with your medicine. °What should I watch for while using this medicine? °Visit your doctor or health care professional for regular checks on your progress. °To help reduce irritation at the injection site, use a different site for each injection and make sure the solution is at room temperature before use. °This medicine may cause increases or decreases in blood sugar. Signs of high blood sugar include frequent urination, unusual thirst, flushed or dry skin, difficulty breathing, drowsiness, stomach ache, nausea, vomiting or dry mouth. Signs of low blood sugar include chills, cool, pale skin or cold sweats, drowsiness, extreme hunger, fast heartbeat, headache, nausea, nervousness or anxiety, shakiness, trembling, unsteadiness, tiredness, or weakness. Contact your doctor or health care professional right away if you experience any of these symptoms. °What side effects may I notice from receiving this medicine? °Side effects that you should report to your doctor or health care professional as soon as possible: °-allergic reactions like skin rash, itching or hives, swelling   of the face, lips, or tongue °-changes in blood sugar °-changes in heart rate °-severe stomach pain °Side effects that  usually do not require medical attention (report to your doctor or health care professional if they continue or are bothersome): °-diarrhea or constipation °-gas or stomach pain °-nausea, vomiting °-pain, redness, swelling and irritation at site where injected °This list may not describe all possible side effects. Call your doctor for medical advice about side effects. You may report side effects to FDA at 1-800-FDA-1088. °Where should I keep my medicine? °Keep out of the reach of children. °Store in a refrigerator between 2 and 8 degrees C (36 and 46 degrees F). Protect from light. Allow to come to room temperature naturally. Do not use artificial heat. If protected from light, the injection may be stored at room temperature between 20 and 30 degrees C (70 and 86 degrees F) for 14 days. After the initial use, throw away any unused portion of a multiple dose vial after 14 days. Throw away unused portions of the ampules after use. °NOTE: This sheet is a summary. It may not cover all possible information. If you have questions about this medicine, talk to your doctor, pharmacist, or health care provider. °© 2017 [employer] (2008-01-26 16:56:04) ° °

## 2016-07-17 ENCOUNTER — Other Ambulatory Visit: Payer: 59

## 2016-07-17 ENCOUNTER — Ambulatory Visit: Payer: 59 | Admitting: Hematology

## 2016-07-22 ENCOUNTER — Other Ambulatory Visit: Payer: 59

## 2016-07-29 ENCOUNTER — Ambulatory Visit: Payer: 59 | Admitting: Hematology

## 2016-07-29 ENCOUNTER — Ambulatory Visit: Payer: 59

## 2016-07-29 ENCOUNTER — Other Ambulatory Visit: Payer: 59

## 2016-08-02 NOTE — Progress Notes (Signed)
[facility] OFFICE PROGRESS NOTE  Patient Care Team: [name], PA-C as PCP - General (Physician Assistant)  DIAGNOSIS: 1. 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  2. 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 The patient underwent surgical resection of her carcinoid tumor on 08/17/2005. Two out of 13 lymph nodes were positive. The patient's stage was III. The patient was noted to have an elevated urinary 5HIAA level in July 2011. A positive octreotide scan was noted coming from the right lower pelvis adjacent to the bladder on 09/06/2010. Initially this was felt to be a uterine fibroid. The patient has continued to have positive octreotide scan on 11/07/2011 and 05/06/2012. CT scans of the abdomen and pelvis were also carried out on 03/02/2010, 02/24/2012 and 06/30/2012. These scans in conjunction with an elevated 5HIAA level of 22.7 on 04/13/2012 and a chromogranin A level which had risen to 18.0 on 03/23/2012 strongly suggested that the patient's disease had recurred. The patient required admission to the hospital from 06/30/2012 through 07/03/2012 for an episode of upper abdominal pain which occurred suddenly the day prior to admission. The patient was started on somatostatin LAR 30 mg IM on 07/07/2012 for recurrent disease involving the lateral segment of the left hepatic lobe, a right jejunal mesenteric node and 2 enhancing masses adjacent to the uterus. Twenty-four-hour urine 5HIAA on 10/19/2012 was 5.5. CT scan of the abdomen and pelvis with IV contrast on 10/19/2012 showed generally stable disease with the exception of a decrease in the right abdominal mesenteric lymphadenopathy. There have been 2 lesions in the liver which are felt to be stable. One lesion is in the lateral segment of the  left hepatic lobe measuring 14 x 14 mm. The second lesion is seen in the lateral segment adjacent to the falciform ligament and measures 9 mm.  2. Uterine fibroids noted on imaging studies dating back to late April 2010.  3. Elevated liver transaminases noted in March 2007. The patient was evaluated by Dr. Delfin Edis in October 2010 and felt that the etiology was hepatic steatosis.  4. Admission to the hospital for upper abdominal pain with sudden onset. Admission dates were 06/30/2012 through 07/03/2012. CT scan of the abdomen and pelvis with IV contrast on 06/30/2012 showed no definite etiology for the pain. It was noted that a lymph node in the right jejunal mesentery had developed central necrosis when compared with the prior study of 02/24/2012. There was no evidence for bowel obstruction. There has previously been no evidence for gallstones on recent CT scans, and an abdominal ultrasound dating back to 05/08/2009.  5. Possible mass involving the left breast on screening mammogram from 06/18/2012. A diagnostic mammogram and ultrasound of the left breast carried out on 07/17/2012 were normal. Followup screening mammogram was suggested in 1 year.  6. Diarrhea with onset in December 2013, somewhat improved with Imodium.   CURRENT THERAPY:  Sandostatin LAR 30 mg IM started on 07/07/2012   INTERVAL HISTORY: Vanessa Waller returns for follow up. The patient states she is doing well overall. She reports occasional diarrhea once or twice a week. She denies any other symptoms at this time. She is continuing to work full time.  REVIEW OF SYSTEMS:   Constitutional: Denies fevers, chills or abnormal weight loss Eyes: Denies blurriness of vision Ears, nose, mouth,  throat, and face: Denies mucositis or sore throat Respiratory: Denies cough, dyspnea or wheezes Cardiovascular: Denies palpitation, chest discomfort or lower extremity swelling Gastrointestinal:  Denies nausea, heartburn. (+) Occasional  diarrhea. Skin: Denies abnormal skin rashes Lymphatics: Denies new lymphadenopathy or easy bruising Neurological:Denies numbness, tingling or new weaknesses Behavioral/Psych: Mood is stable, no new changes  All other systems were reviewed with the patient and are negative.  MEDICAL HISTORY:  Past Medical History:  Diagnosis Date  . 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 carcinoid tumor dx'd 08/2005  . Diverticulosis   . Hepatic steatosis 02/24/12  . Hypopotassemia   . 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 ([place])    liver,mesentery,pelvis  . Nephrolithiasis   . Nonspecific elevation of levels of transaminase or lactic acid dehydrogenase (LDH)   . Small bowel obstruction   . Uterine fibroid     SURGICAL HISTORY: Past Surgical History:  Procedure Laterality Date  . carcinoid tumor resection     ileum  . TUBAL LIGATION      I have reviewed the social history and family history with the patient and they are unchanged from previous note.  ALLERGIES:  is allergic to cephalexin and amoxicillin.  MEDICATIONS:  Current Outpatient Prescriptions  Medication Sig Dispense Refill  . ascorbic acid (VITAMIN C) 500 MG tablet Take 500 mg by mouth daily.    . Calcium Carbonate-Vitamin D (CALCIUM 600-D PO) Take 1 tablet by mouth daily.    . Cholecalciferol (VITAMIN D-3) 5000 UNITS TABS Take 1 tablet by mouth daily.    [name] loperamide (IMODIUM) 2 MG capsule Take by mouth as needed for diarrhea or loose stools.    . Multiple Vitamin (MULTIVITAMIN) tablet Take 1 tablet by mouth daily.    [name] octreotide (SANDOSTATIN LAR) 30 MG injection Inject 30 mg into the muscle every 28 (twenty-eight) days.    . potassium chloride SA (K-DUR,KLOR-CON) 20 MEQ tablet Take 1 tablet by mouth  daily (Patient taking differently: Take 1 tablet by mouth  every other day.) 90 tablet 0   No current facility-administered medications for this visit.     PHYSICAL EXAMINATION: ECOG PERFORMANCE STATUS: 0 -  Asymptomatic  Vitals:   08/05/16 0840  BP: (!) 153/79  Pulse: 81  Resp: 18  Temp: 97.8 F (36.6 C)   Filed Weights   08/05/16 0840  Weight: 133 lb 8 oz (60.6 kg)    GENERAL:alert, no distress and comfortable SKIN: skin color, texture, turgor are normal, no rashes or significant lesions EYES: normal, Conjunctiva are pink and non-injected, sclera clear OROPHARYNX:no exudate, no erythema and lips, buccal mucosa, and tongue normal  NECK: supple, thyroid normal size, non-tender, without nodularity LYMPH:  no palpable lymphadenopathy in the cervical, axillary or inguinal LUNGS: clear to auscultation and percussion with normal breathing effort HEART: regular rate & rhythm and no murmurs and no lower extremity edema ABDOMEN:abdomen soft, non-tender and normal bowel sounds Musculoskeletal:no cyanosis of digits and no clubbing  NEURO: alert & oriented x 3 with fluent speech, no focal motor/sensory deficits  LABORATORY DATA:  I have reviewed the data as listed CBC Latest Ref Rng & Units 08/05/2016 06/10/2016 04/15/2016  WBC 3.9 - 10.3 10e3/uL 5.0 4.6 5.8  Hemoglobin 11.6 - 15.9 g/dL 14.5 15.0 15.0  Hematocrit 34.8 - 46.6 % 44.3 45.7 45.6  Platelets 145 - 400 10e3/uL 235 244 245    CMP Latest Ref Rng & Units 08/05/2016 06/10/2016 04/15/2016  Glucose 70 - 140 mg/dl 89 93 99  BUN 7.0 - 26.0 mg/dL 12.8 11.9 12.2  Creatinine 0.6 - 1.1 mg/dL 0.8 0.9 0.8  Sodium 136 - 145 mEq/L 141 142 143  Potassium 3.5 - 5.1 mEq/L 3.5 3.7 3.5  Chloride 98 - 107 mEq/L - - -  CO2 22 - 29 mEq/L 29 28 27   Calcium 8.4 - 10.4 mg/dL 9.4 10.1 9.6  Total Protein 6.4 - 8.3 g/dL 7.6 8.1 8.1  Total Bilirubin 0.20 - 1.20 mg/dL 0.65 0.78 0.77  Alkaline Phos 40 - 150 U/L 62 75 71  AST 5 - 34 U/L 43(H) 59(H) 60(H)  ALT 0 - 55 U/L 37 59(H) 60(H)   Chromogranin A   Ref Range & Units 1mo ago 69mo ago 71mo ago 51mo ago    Chromogranin A 0 - 5 nmol/L 5  4CM  4CM  4CM     5 HIAA, quantitative, urine, 24 hour   Ref Range  & Units 63mo ago 59mo ago 66mo ago 41yr ago    5-HIAA, Urine Undefined mg/L 5.1  4.0CM  3.5CM  <=6.0 mg/24 h" class="[id number]"> 5.2R   Comments: Total Volume: 1000 mL   5-HIAA,Quant.,24 Hr Urine 0.0 - 14.9 mg/24 hr 5.1  6.0CM  5.3CM      RADIOGRAPHIC STUDIES:  CT CAP W CONTRAST 06/17/16 IMPRESSION: 1. Continued stability of multifocal metastatic disease involving the liver, peritoneum M and bilateral adnexum. No significant disease progression identified. 2. No significant thoracic findings 3. Aortic atherosclerosis 4. Hepatic steatosis.  CT chest, abdomen and pelvis 01/15/2016 IMPRESSION: 1. Continued stability of multifocal metastatic disease involving the liver, peritoneum and bilateral adnexa. No disease progression identified. 2. No significant thoracic findings. 3. Hepatic steatosis. 4.  Aortic Atherosclerosis (ICD10-170.0)   ASSESSMENT & PLAN:  61 y.o. female with  1. Metastatic carcinoid tumor of ileum to liver, peritoneum and adnexa -We previously reviewed the natural history of metastatic carcinoid tumor. She understands this is an incurable disease, and the goal of therapy is disease control and prolong her life. -We also previously reviewed the overall treatment strategy for metastatic carcinoid tumor, she understands her disease will probably progress at some point, and we'll move on to second or third line treatment, such as underlying Everolimus or chemotherapy. -We previously  discussed the role of liver targeted therapy, and new treatment option 177 Lu-Dotatate in the future if needed  -She has been doing very well clinically, chromogranin A and urine 5 HIAA level are mostly normal. -Restaging CT from 06/17/16 showed stable disease with no significant disease progression identified. I reviewed the scan findings with her -lab results reviewed with her. -She is clinically doing very well, occasional diarrhea, no other symptoms.   -continue Sandostatin injection  q4w.  2. Hypokalemia -Probably related to her diarrhea, K 3.7 on 06/10/16. -She is on potassium 1 tablet every other day, we'll continue. Her K was 3.5 today   3. HTN -Her blood pressure was elevated. I encouraged her to monitor her blood pressure home. She states her BP has been normal at home and other office  -Follow-up with primary care physician.  4. Cancer screening -She is overdue for mammogram, last mammogram in July 2015. -The patient forgot to schedule her breast mammogram. I will put in a request in one month for a bilateral screening mammogram.  She will continue follow up with her PCP and other specialists.   PLAN -Continue Sandostatin every 4 weeks. -Labs every 2 months. She uses my chart to check her lab results  -Urine  test in 3 months. -F/u in 6 months with CT (with contrast) and lab 1 week prior. -I will order a mammogram at the [facility] for a bilateral screening mammogram to occur within 1 month.  All questions were answered. The patient knows to call the clinic with any problems, questions or concerns. No barriers to learning was detected.  I spent 20 minutes counseling the patient face to face. The total time spent in the appointment was 30 minutes and more than 50% was on counseling and review of test results     Truitt Merle, MD 08/05/2016   This document serves as a record of services personally performed by Truitt Merle, MD. It was created on her behalf by Darcus Austin, a trained medical scribe. The creation of this record is based on the scribe's personal observations and the provider's statements to them. This document has been checked and approved by the attending provider.

## 2016-08-05 ENCOUNTER — Encounter: Payer: Self-pay | Admitting: Hematology

## 2016-08-05 ENCOUNTER — Ambulatory Visit (HOSPITAL_BASED_OUTPATIENT_CLINIC_OR_DEPARTMENT_OTHER): Payer: 59 | Admitting: Hematology

## 2016-08-05 ENCOUNTER — Other Ambulatory Visit (HOSPITAL_BASED_OUTPATIENT_CLINIC_OR_DEPARTMENT_OTHER): Payer: 59

## 2016-08-05 ENCOUNTER — Telehealth: Payer: Self-pay | Admitting: Hematology

## 2016-08-05 ENCOUNTER — Ambulatory Visit (HOSPITAL_BASED_OUTPATIENT_CLINIC_OR_DEPARTMENT_OTHER): Payer: 59

## 2016-08-05 VITALS — BP 153/79 | HR 81 | Temp 97.8°F | Resp 18 | Ht 62.0 in | Wt 133.5 lb

## 2016-08-05 DIAGNOSIS — C7A012 Malignant carcinoid tumor of the ileum: Secondary | ICD-10-CM

## 2016-08-05 DIAGNOSIS — C7B09 Secondary carcinoid tumors of other sites: Secondary | ICD-10-CM | POA: Diagnosis not present

## 2016-08-05 DIAGNOSIS — C7B04 Secondary carcinoid tumors of peritoneum: Secondary | ICD-10-CM | POA: Diagnosis not present

## 2016-08-05 DIAGNOSIS — C7B02 Secondary carcinoid tumors of liver: Secondary | ICD-10-CM

## 2016-08-05 DIAGNOSIS — D3A012 Benign carcinoid tumor of the ileum: Secondary | ICD-10-CM

## 2016-08-05 DIAGNOSIS — Z1231 Encounter for screening mammogram for malignant neoplasm of breast: Secondary | ICD-10-CM

## 2016-08-05 DIAGNOSIS — E876 Hypokalemia: Secondary | ICD-10-CM

## 2016-08-05 DIAGNOSIS — I10 Essential (primary) hypertension: Secondary | ICD-10-CM

## 2016-08-05 LAB — CBC WITH DIFFERENTIAL/PLATELET
BASO%: 0.8 % (ref 0.0–2.0)
Basophils Absolute: 0 10*3/uL (ref 0.0–0.1)
EOS ABS: 0 10*3/uL (ref 0.0–0.5)
EOS%: 0.4 % (ref 0.0–7.0)
HCT: 44.3 % (ref 34.8–46.6)
HGB: 14.5 g/dL (ref 11.6–15.9)
LYMPH%: 35.5 % (ref 14.0–49.7)
MCH: 30.3 pg (ref 25.1–34.0)
MCHC: 32.7 g/dL (ref 31.5–36.0)
MCV: 92.5 fL (ref 79.5–101.0)
MONO#: 0.3 10*3/uL (ref 0.1–0.9)
MONO%: 5.2 % (ref 0.0–14.0)
NEUT%: 58.1 % (ref 38.4–76.8)
NEUTROS ABS: 2.9 10*3/uL (ref 1.5–6.5)
Platelets: 235 10*3/uL (ref 145–400)
RBC: 4.79 10*6/uL (ref 3.70–5.45)
RDW: 12.2 % (ref 11.2–14.5)
WBC: 5 10*3/uL (ref 3.9–10.3)
lymph#: 1.8 10*3/uL (ref 0.9–3.3)

## 2016-08-05 LAB — COMPREHENSIVE METABOLIC PANEL
ALT: 37 U/L (ref 0–55)
ANION GAP: 8 meq/L (ref 3–11)
AST: 43 U/L — ABNORMAL HIGH (ref 5–34)
Albumin: 4 g/dL (ref 3.5–5.0)
Alkaline Phosphatase: 62 U/L (ref 40–150)
BUN: 12.8 mg/dL (ref 7.0–26.0)
CALCIUM: 9.4 mg/dL (ref 8.4–10.4)
CHLORIDE: 105 meq/L (ref 98–109)
CO2: 29 meq/L (ref 22–29)
CREATININE: 0.8 mg/dL (ref 0.6–1.1)
EGFR: 78 mL/min/{1.73_m2} — AB (ref >90)
Glucose: 89 mg/dl (ref 70–140)
Potassium: 3.5 mEq/L (ref 3.5–5.1)
Sodium: 141 mEq/L (ref 136–145)
Total Bilirubin: 0.65 mg/dL (ref 0.20–1.20)
Total Protein: 7.6 g/dL (ref 6.4–8.3)

## 2016-08-05 MED ORDER — OCTREOTIDE ACETATE 30 MG IM KIT
30.0000 mg | PACK | Freq: Once | INTRAMUSCULAR | Status: AC
  Administered 2016-08-05: 30 mg via INTRAMUSCULAR
  Filled 2016-08-05: qty 1

## 2016-08-05 NOTE — Telephone Encounter (Signed)
Appointments scheduled per 08/05/16 los. Patient was given a copy of the updated appointment schedule and AVS report per 08/05/16 los. ° °

## 2016-08-05 NOTE — Patient Instructions (Signed)

## 2016-08-07 LAB — CHROMOGRANIN A: CHROMOGRAN A: 5 nmol/L (ref 0–5)

## 2016-09-02 ENCOUNTER — Ambulatory Visit (HOSPITAL_BASED_OUTPATIENT_CLINIC_OR_DEPARTMENT_OTHER): Payer: 59

## 2016-09-02 VITALS — BP 145/79 | HR 84 | Temp 98.1°F | Resp 18

## 2016-09-02 DIAGNOSIS — C7B04 Secondary carcinoid tumors of peritoneum: Secondary | ICD-10-CM | POA: Diagnosis not present

## 2016-09-02 DIAGNOSIS — C7A012 Malignant carcinoid tumor of the ileum: Secondary | ICD-10-CM | POA: Diagnosis not present

## 2016-09-02 DIAGNOSIS — C7B09 Secondary carcinoid tumors of other sites: Secondary | ICD-10-CM

## 2016-09-02 DIAGNOSIS — C7B02 Secondary carcinoid tumors of liver: Secondary | ICD-10-CM

## 2016-09-02 DIAGNOSIS — D3A012 Benign carcinoid tumor of the ileum: Secondary | ICD-10-CM

## 2016-09-02 MED ORDER — OCTREOTIDE ACETATE 30 MG IM KIT
30.0000 mg | PACK | Freq: Once | INTRAMUSCULAR | Status: AC
Start: 2016-09-02 — End: 2016-09-02
  Administered 2016-09-02: 30 mg via INTRAMUSCULAR
  Filled 2016-09-02: qty 1

## 2016-09-02 NOTE — Patient Instructions (Signed)

## 2016-09-27 ENCOUNTER — Other Ambulatory Visit: Payer: Self-pay | Admitting: *Deleted

## 2016-09-30 ENCOUNTER — Other Ambulatory Visit (HOSPITAL_BASED_OUTPATIENT_CLINIC_OR_DEPARTMENT_OTHER): Payer: 59

## 2016-09-30 ENCOUNTER — Ambulatory Visit (HOSPITAL_BASED_OUTPATIENT_CLINIC_OR_DEPARTMENT_OTHER): Payer: 59

## 2016-09-30 VITALS — BP 154/79 | HR 85 | Temp 97.9°F | Resp 18

## 2016-09-30 DIAGNOSIS — C7B09 Secondary carcinoid tumors of other sites: Secondary | ICD-10-CM | POA: Diagnosis not present

## 2016-09-30 DIAGNOSIS — D3A012 Benign carcinoid tumor of the ileum: Secondary | ICD-10-CM

## 2016-09-30 DIAGNOSIS — C7B02 Secondary carcinoid tumors of liver: Secondary | ICD-10-CM | POA: Diagnosis not present

## 2016-09-30 DIAGNOSIS — C7B04 Secondary carcinoid tumors of peritoneum: Secondary | ICD-10-CM

## 2016-09-30 DIAGNOSIS — C7A012 Malignant carcinoid tumor of the ileum: Secondary | ICD-10-CM

## 2016-09-30 LAB — CBC WITH DIFFERENTIAL/PLATELET
BASO%: 1 % (ref 0.0–2.0)
BASOS ABS: 0.1 10*3/uL (ref 0.0–0.1)
EOS%: 1 % (ref 0.0–7.0)
Eosinophils Absolute: 0.1 10*3/uL (ref 0.0–0.5)
HEMATOCRIT: 43.8 % (ref 34.8–46.6)
HEMOGLOBIN: 14.5 g/dL (ref 11.6–15.9)
LYMPH#: 1.6 10*3/uL (ref 0.9–3.3)
LYMPH%: 33.3 % (ref 14.0–49.7)
MCH: 30.6 pg (ref 25.1–34.0)
MCHC: 33.1 g/dL (ref 31.5–36.0)
MCV: 92.4 fL (ref 79.5–101.0)
MONO#: 0.2 10*3/uL (ref 0.1–0.9)
MONO%: 4.7 % (ref 0.0–14.0)
NEUT#: 2.9 10*3/uL (ref 1.5–6.5)
NEUT%: 60 % (ref 38.4–76.8)
PLATELETS: 237 10*3/uL (ref 145–400)
RBC: 4.74 10*6/uL (ref 3.70–5.45)
RDW: 12.3 % (ref 11.2–14.5)
WBC: 4.9 10*3/uL (ref 3.9–10.3)

## 2016-09-30 LAB — COMPREHENSIVE METABOLIC PANEL
ALT: 38 U/L (ref 0–55)
ANION GAP: 9 meq/L (ref 3–11)
AST: 40 U/L — ABNORMAL HIGH (ref 5–34)
Albumin: 4.2 g/dL (ref 3.5–5.0)
Alkaline Phosphatase: 69 U/L (ref 40–150)
BILIRUBIN TOTAL: 0.72 mg/dL (ref 0.20–1.20)
BUN: 12.4 mg/dL (ref 7.0–26.0)
CALCIUM: 9.7 mg/dL (ref 8.4–10.4)
CHLORIDE: 105 meq/L (ref 98–109)
CO2: 29 meq/L (ref 22–29)
CREATININE: 0.9 mg/dL (ref 0.6–1.1)
EGFR: 68 mL/min/{1.73_m2} — AB (ref >90)
Glucose: 109 mg/dl (ref 70–140)
POTASSIUM: 3.8 meq/L (ref 3.5–5.1)
Sodium: 143 mEq/L (ref 136–145)
Total Protein: 7.8 g/dL (ref 6.4–8.3)

## 2016-09-30 MED ORDER — OCTREOTIDE ACETATE 30 MG IM KIT
30.0000 mg | PACK | Freq: Once | INTRAMUSCULAR | Status: AC
  Administered 2016-09-30: 30 mg via INTRAMUSCULAR
  Filled 2016-09-30: qty 1

## 2016-10-02 LAB — CHROMOGRANIN A: Chromogranin A: 5 nmol/L (ref 0–5)

## 2016-10-28 ENCOUNTER — Ambulatory Visit (HOSPITAL_BASED_OUTPATIENT_CLINIC_OR_DEPARTMENT_OTHER): Payer: 59

## 2016-10-28 VITALS — BP 157/83 | HR 91 | Temp 98.0°F | Resp 20

## 2016-10-28 DIAGNOSIS — C7B02 Secondary carcinoid tumors of liver: Secondary | ICD-10-CM | POA: Diagnosis not present

## 2016-10-28 DIAGNOSIS — D3A012 Benign carcinoid tumor of the ileum: Secondary | ICD-10-CM

## 2016-10-28 DIAGNOSIS — C7A012 Malignant carcinoid tumor of the ileum: Secondary | ICD-10-CM | POA: Diagnosis not present

## 2016-10-28 DIAGNOSIS — C7B04 Secondary carcinoid tumors of peritoneum: Secondary | ICD-10-CM

## 2016-10-28 DIAGNOSIS — C7B09 Secondary carcinoid tumors of other sites: Secondary | ICD-10-CM

## 2016-10-28 MED ORDER — OCTREOTIDE ACETATE 30 MG IM KIT
30.0000 mg | PACK | Freq: Once | INTRAMUSCULAR | Status: AC
  Administered 2016-10-28: 30 mg via INTRAMUSCULAR
  Filled 2016-10-28: qty 1

## 2016-10-28 NOTE — Patient Instructions (Signed)

## 2016-11-13 ENCOUNTER — Telehealth: Payer: Self-pay | Admitting: *Deleted

## 2016-11-13 NOTE — Telephone Encounter (Signed)
Pt left vm asking if there are any contradictions or interactions with any of her meds if she gets a shingles vaccine which she is scheduled to get in next few weeks.  Discussed with DR Burr Medico & left message at work # that this should be OK.

## 2016-11-22 DIAGNOSIS — Z0000 Encounter for general adult medical examination without abnormal findings: Secondary | ICD-10-CM | POA: Diagnosis not present

## 2016-11-25 ENCOUNTER — Ambulatory Visit (HOSPITAL_BASED_OUTPATIENT_CLINIC_OR_DEPARTMENT_OTHER): Payer: 59

## 2016-11-25 ENCOUNTER — Other Ambulatory Visit (HOSPITAL_BASED_OUTPATIENT_CLINIC_OR_DEPARTMENT_OTHER): Payer: 59

## 2016-11-25 VITALS — BP 152/60 | HR 82 | Temp 98.5°F | Resp 16

## 2016-11-25 DIAGNOSIS — C7B09 Secondary carcinoid tumors of other sites: Secondary | ICD-10-CM | POA: Diagnosis not present

## 2016-11-25 DIAGNOSIS — C7A012 Malignant carcinoid tumor of the ileum: Secondary | ICD-10-CM

## 2016-11-25 DIAGNOSIS — C7B04 Secondary carcinoid tumors of peritoneum: Secondary | ICD-10-CM

## 2016-11-25 DIAGNOSIS — C7B02 Secondary carcinoid tumors of liver: Secondary | ICD-10-CM

## 2016-11-25 DIAGNOSIS — D3A012 Benign carcinoid tumor of the ileum: Secondary | ICD-10-CM

## 2016-11-25 LAB — CBC WITH DIFFERENTIAL/PLATELET
BASO%: 1 % (ref 0.0–2.0)
BASOS ABS: 0 10*3/uL (ref 0.0–0.1)
EOS%: 1.5 % (ref 0.0–7.0)
Eosinophils Absolute: 0.1 10*3/uL (ref 0.0–0.5)
HCT: 42.6 % (ref 34.8–46.6)
HGB: 14.3 g/dL (ref 11.6–15.9)
LYMPH%: 32.3 % (ref 14.0–49.7)
MCH: 30.4 pg (ref 25.1–34.0)
MCHC: 33.6 g/dL (ref 31.5–36.0)
MCV: 90.5 fL (ref 79.5–101.0)
MONO#: 0.2 10*3/uL (ref 0.1–0.9)
MONO%: 5.9 % (ref 0.0–14.0)
NEUT#: 2.4 10*3/uL (ref 1.5–6.5)
NEUT%: 59.3 % (ref 38.4–76.8)
Platelets: 216 10*3/uL (ref 145–400)
RBC: 4.71 10*6/uL (ref 3.70–5.45)
RDW: 12.5 % (ref 11.2–14.5)
WBC: 4 10*3/uL (ref 3.9–10.3)
lymph#: 1.3 10*3/uL (ref 0.9–3.3)

## 2016-11-25 LAB — COMPREHENSIVE METABOLIC PANEL
ALT: 49 U/L (ref 0–55)
AST: 61 U/L — AB (ref 5–34)
Albumin: 4 g/dL (ref 3.5–5.0)
Alkaline Phosphatase: 67 U/L (ref 40–150)
Anion Gap: 11 mEq/L (ref 3–11)
BUN: 12.7 mg/dL (ref 7.0–26.0)
CALCIUM: 9.5 mg/dL (ref 8.4–10.4)
CHLORIDE: 107 meq/L (ref 98–109)
CO2: 26 mEq/L (ref 22–29)
Creatinine: 0.9 mg/dL (ref 0.6–1.1)
EGFR: 71 mL/min/{1.73_m2} — AB (ref >90)
GLUCOSE: 150 mg/dL — AB (ref 70–140)
POTASSIUM: 3.8 meq/L (ref 3.5–5.1)
SODIUM: 144 meq/L (ref 136–145)
Total Bilirubin: 0.61 mg/dL (ref 0.20–1.20)
Total Protein: 7.7 g/dL (ref 6.4–8.3)

## 2016-11-25 MED ORDER — OCTREOTIDE ACETATE 30 MG IM KIT
30.0000 mg | PACK | Freq: Once | INTRAMUSCULAR | Status: AC
  Administered 2016-11-25: 30 mg via INTRAMUSCULAR
  Filled 2016-11-25: qty 1

## 2016-11-25 NOTE — Patient Instructions (Signed)

## 2016-11-27 LAB — CHROMOGRANIN A: CHROMOGRAN A: 3 nmol/L (ref 0–5)

## 2016-12-23 ENCOUNTER — Ambulatory Visit (HOSPITAL_BASED_OUTPATIENT_CLINIC_OR_DEPARTMENT_OTHER): Payer: 59

## 2016-12-23 VITALS — BP 140/78 | HR 84 | Temp 98.4°F | Resp 16

## 2016-12-23 DIAGNOSIS — C7B09 Secondary carcinoid tumors of other sites: Secondary | ICD-10-CM | POA: Diagnosis not present

## 2016-12-23 DIAGNOSIS — C7B04 Secondary carcinoid tumors of peritoneum: Secondary | ICD-10-CM | POA: Diagnosis not present

## 2016-12-23 DIAGNOSIS — C7B02 Secondary carcinoid tumors of liver: Secondary | ICD-10-CM | POA: Diagnosis not present

## 2016-12-23 DIAGNOSIS — C7A00 Malignant carcinoid tumor of unspecified site: Secondary | ICD-10-CM | POA: Diagnosis not present

## 2016-12-23 DIAGNOSIS — D3A012 Benign carcinoid tumor of the ileum: Secondary | ICD-10-CM

## 2016-12-23 MED ORDER — OCTREOTIDE ACETATE 30 MG IM KIT
30.0000 mg | PACK | Freq: Once | INTRAMUSCULAR | Status: AC
  Administered 2016-12-23: 30 mg via INTRAMUSCULAR
  Filled 2016-12-23: qty 1

## 2016-12-25 LAB — 5 HIAA, QUANTITATIVE, URINE, 24 HOUR
5-HIAA, Urine: 5.6 mg/L
5-HIAA,QUANT.,24 HR URINE: 5.3 mg/(24.h) (ref 0.0–14.9)

## 2017-01-16 ENCOUNTER — Encounter: Payer: Self-pay | Admitting: *Deleted

## 2017-01-20 ENCOUNTER — Ambulatory Visit (HOSPITAL_BASED_OUTPATIENT_CLINIC_OR_DEPARTMENT_OTHER): Payer: 59

## 2017-01-20 ENCOUNTER — Other Ambulatory Visit (HOSPITAL_BASED_OUTPATIENT_CLINIC_OR_DEPARTMENT_OTHER): Payer: 59

## 2017-01-20 VITALS — BP 158/70 | HR 99 | Temp 98.0°F | Resp 18

## 2017-01-20 DIAGNOSIS — C7B04 Secondary carcinoid tumors of peritoneum: Secondary | ICD-10-CM

## 2017-01-20 DIAGNOSIS — C7A012 Malignant carcinoid tumor of the ileum: Secondary | ICD-10-CM

## 2017-01-20 DIAGNOSIS — C7B09 Secondary carcinoid tumors of other sites: Secondary | ICD-10-CM | POA: Diagnosis not present

## 2017-01-20 DIAGNOSIS — D3A012 Benign carcinoid tumor of the ileum: Secondary | ICD-10-CM

## 2017-01-20 DIAGNOSIS — C7B02 Secondary carcinoid tumors of liver: Secondary | ICD-10-CM

## 2017-01-20 LAB — CBC WITH DIFFERENTIAL/PLATELET
BASO%: 0.6 % (ref 0.0–2.0)
BASOS ABS: 0 10*3/uL (ref 0.0–0.1)
EOS ABS: 0 10*3/uL (ref 0.0–0.5)
EOS%: 0.3 % (ref 0.0–7.0)
HEMATOCRIT: 43.6 % (ref 34.8–46.6)
HEMOGLOBIN: 14 g/dL (ref 11.6–15.9)
LYMPH%: 21.6 % (ref 14.0–49.7)
MCH: 30.2 pg (ref 25.1–34.0)
MCHC: 32.1 g/dL (ref 31.5–36.0)
MCV: 94.2 fL (ref 79.5–101.0)
MONO#: 0.3 10*3/uL (ref 0.1–0.9)
MONO%: 4.6 % (ref 0.0–14.0)
NEUT%: 72.9 % (ref 38.4–76.8)
NEUTROS ABS: 5.1 10*3/uL (ref 1.5–6.5)
PLATELETS: 237 10*3/uL (ref 145–400)
RBC: 4.63 10*6/uL (ref 3.70–5.45)
RDW: 12.4 % (ref 11.2–14.5)
WBC: 6.9 10*3/uL (ref 3.9–10.3)
lymph#: 1.5 10*3/uL (ref 0.9–3.3)

## 2017-01-20 LAB — COMPREHENSIVE METABOLIC PANEL
ALBUMIN: 4.1 g/dL (ref 3.5–5.0)
ALK PHOS: 64 U/L (ref 40–150)
ALT: 31 U/L (ref 0–55)
ANION GAP: 9 meq/L (ref 3–11)
AST: 36 U/L — ABNORMAL HIGH (ref 5–34)
BILIRUBIN TOTAL: 1.03 mg/dL (ref 0.20–1.20)
BUN: 12.9 mg/dL (ref 7.0–26.0)
CALCIUM: 9.6 mg/dL (ref 8.4–10.4)
CO2: 29 mEq/L (ref 22–29)
Chloride: 105 mEq/L (ref 98–109)
Creatinine: 0.9 mg/dL (ref 0.6–1.1)
EGFR: 71 mL/min/{1.73_m2} — AB (ref >90)
GLUCOSE: 90 mg/dL (ref 70–140)
Potassium: 3.7 mEq/L (ref 3.5–5.1)
Sodium: 143 mEq/L (ref 136–145)
TOTAL PROTEIN: 7.8 g/dL (ref 6.4–8.3)

## 2017-01-20 MED ORDER — OCTREOTIDE ACETATE 30 MG IM KIT
30.0000 mg | PACK | Freq: Once | INTRAMUSCULAR | Status: AC
  Administered 2017-01-20: 30 mg via INTRAMUSCULAR
  Filled 2017-01-20: qty 1

## 2017-01-20 NOTE — Patient Instructions (Signed)

## 2017-01-22 LAB — CHROMOGRANIN A: CHROMOGRAN A: 5 nmol/L (ref 0–5)

## 2017-01-27 ENCOUNTER — Ambulatory Visit: Payer: 59 | Admitting: Hematology

## 2017-02-06 ENCOUNTER — Other Ambulatory Visit: Payer: Self-pay | Admitting: Hematology

## 2017-02-06 DIAGNOSIS — C7B09 Secondary carcinoid tumors of other sites: Secondary | ICD-10-CM

## 2017-02-10 ENCOUNTER — Encounter (HOSPITAL_COMMUNITY): Payer: Self-pay

## 2017-02-10 ENCOUNTER — Ambulatory Visit (HOSPITAL_COMMUNITY)
Admission: RE | Admit: 2017-02-10 | Discharge: 2017-02-10 | Disposition: A | Discharge status: Home / Self Care | Payer: 59 | Source: Ambulatory Visit | Attending: Hematology | Admitting: Hematology

## 2017-02-10 DIAGNOSIS — C7A00 Malignant carcinoid tumor of unspecified site: Secondary | ICD-10-CM | POA: Diagnosis not present

## 2017-02-10 DIAGNOSIS — R918 Other nonspecific abnormal finding of lung field: Secondary | ICD-10-CM | POA: Diagnosis not present

## 2017-02-10 DIAGNOSIS — K669 Disorder of peritoneum, unspecified: Secondary | ICD-10-CM | POA: Diagnosis not present

## 2017-02-10 DIAGNOSIS — C7B09 Secondary carcinoid tumors of other sites: Secondary | ICD-10-CM | POA: Diagnosis present

## 2017-02-10 DIAGNOSIS — N200 Calculus of kidney: Secondary | ICD-10-CM | POA: Diagnosis not present

## 2017-02-10 DIAGNOSIS — K769 Liver disease, unspecified: Secondary | ICD-10-CM | POA: Diagnosis not present

## 2017-02-10 MED ORDER — IOPAMIDOL (ISOVUE-300) INJECTION 61%
INTRAVENOUS | Status: AC
  Filled 2017-02-10: qty 100

## 2017-02-10 MED ORDER — IOPAMIDOL (ISOVUE-300) INJECTION 61%
100.0000 mL | Freq: Once | INTRAVENOUS | Status: AC | PRN
Start: 2017-02-10 — End: 2017-02-10
  Administered 2017-02-10: 100 mL via INTRAVENOUS

## 2017-02-14 NOTE — Progress Notes (Addendum)
[facility] OFFICE PROGRESS NOTE  Patient Care Team: [name], Hershal Coria as PCP - General (Physician Assistant)  DIAGNOSIS: 1. 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  2. 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 The patient underwent surgical resection of her carcinoid tumor on 08/17/2005. Two out of 13 lymph nodes were positive. The patient's stage was III. The patient was noted to have an elevated urinary 5HIAA level in July 2011. A positive octreotide scan was noted coming from the right lower pelvis adjacent to the bladder on 09/06/2010. Initially this was felt to be a uterine fibroid. The patient has continued to have positive octreotide scan on 11/07/2011 and 05/06/2012. CT scans of the abdomen and pelvis were also carried out on 03/02/2010, 02/24/2012 and 06/30/2012. These scans in conjunction with an elevated 5HIAA level of 22.7 on 04/13/2012 and a chromogranin A level which had risen to 18.0 on 03/23/2012 strongly suggested that the patient's disease had recurred. The patient required admission to the hospital from 06/30/2012 through 07/03/2012 for an episode of upper abdominal pain which occurred suddenly the day prior to admission. The patient was started on somatostatin LAR 30 mg IM on 07/07/2012 for recurrent disease involving the lateral segment of the left hepatic lobe, a right jejunal mesenteric node and 2 enhancing masses adjacent to the uterus. Twenty-four-hour urine 5HIAA on 10/19/2012 was 5.5. CT scan of the abdomen and pelvis with IV contrast on 10/19/2012 showed generally stable disease with the exception of a decrease in the right abdominal mesenteric lymphadenopathy. There have been 2 lesions in the liver which are felt to be stable. One lesion is in the lateral segment of  the left hepatic lobe measuring 14 x 14 mm. The second lesion is seen in the lateral segment adjacent to the falciform ligament and measures 9 mm.  2. Uterine fibroids noted on imaging studies dating back to late April 2010.  3. Elevated liver transaminases noted in March 2007. The patient was evaluated by Dr. Delfin Edis in October 2010 and felt that the etiology was hepatic steatosis.  4. Admission to the hospital for upper abdominal pain with sudden onset. Admission dates were 06/30/2012 through 07/03/2012. CT scan of the abdomen and pelvis with IV contrast on 06/30/2012 showed no definite etiology for the pain. It was noted that a lymph node in the right jejunal mesentery had developed central necrosis when compared with the prior study of 02/24/2012. There was no evidence for bowel obstruction. There has previously been no evidence for gallstones on recent CT scans, and an abdominal ultrasound dating back to 05/08/2009.  5. Possible mass involving the left breast on screening mammogram from 06/18/2012. A diagnostic mammogram and ultrasound of the left breast carried out on 07/17/2012 were normal. Followup screening mammogram was suggested in 1 year. 6. Diarrhea with onset in December 2013, improved with treatment and use Imodium as needed.   CURRENT THERAPY:  Sandostatin LAR 30 mg IM every 4 weeks, started on 07/07/2012   INTERVAL HISTORY: Vanessa Waller returns for follow up. She has been doing well. Denies any pain or stomach issues, pelvic pain or vaginal bleeding or discharge. Her insurance did not cover her pelvis exam and she is concerned if she should talk to her insurance company. She takes potassium supplement every other day. She still has  occasional diarrhea and flushing that happens maybe once every few months.. Her appetite has been normal.    REVIEW OF SYSTEMS:   Constitutional: Denies fevers, chills or abnormal weight loss (+)occosional flushing Eyes: Denies blurriness of  vision Ears, nose, mouth, throat, and face: Denies mucositis or sore throat Respiratory: Denies cough, dyspnea or wheezes Cardiovascular: Denies palpitation, chest discomfort or lower extremity swelling Gastrointestinal:  Denies nausea, heartburn. (+)occasional diarrhea Skin: Denies abnormal skin rashes Lymphatics: Denies new lymphadenopathy or easy bruising Neurological:Denies numbness, tingling or new weaknesses Behavioral/Psych: Mood is stable, no new changes  All other systems were reviewed with the patient and are negative.  MEDICAL HISTORY:  Past Medical History:  Diagnosis Date  . 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 carcinoid tumor dx'd 08/2005  . Diverticulosis   . Hepatic steatosis 02/24/12  . Hypopotassemia   . 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 ([place])    liver,mesentery,pelvis  . Nephrolithiasis   . Nonspecific elevation of levels of transaminase or lactic acid dehydrogenase (LDH)   . Small bowel obstruction ([place])   . Uterine fibroid     SURGICAL HISTORY: Past Surgical History:  Procedure Laterality Date  . carcinoid tumor resection     ileum  . TUBAL LIGATION      I have reviewed the social history and family history with the patient and they are unchanged from previous note.  ALLERGIES:  is allergic to cephalexin and amoxicillin.  MEDICATIONS:  Current Outpatient Prescriptions  Medication Sig Dispense Refill  . ascorbic acid (VITAMIN C) 500 MG tablet Take 500 mg by mouth daily.    . Calcium Carbonate-Vitamin D (CALCIUM 600-D PO) Take 1 tablet by mouth daily.    . Cholecalciferol (VITAMIN D-3) 5000 UNITS TABS Take 1 tablet by mouth daily.    [name] loperamide (IMODIUM) 2 MG capsule Take by mouth as needed for diarrhea or loose stools.    . Multiple Vitamin (MULTIVITAMIN) tablet Take 1 tablet by mouth daily.    [name] octreotide (SANDOSTATIN LAR) 30 MG injection Inject 30 mg into the muscle every 28 (twenty-eight) days.    . potassium chloride  SA (K-DUR,KLOR-CON) 20 MEQ tablet Take 1 tablet by mouth  daily (Patient taking differently: Take 1 tablet by mouth  every other day.) 90 tablet 0   No current facility-administered medications for this visit.     PHYSICAL EXAMINATION:  ECOG PERFORMANCE STATUS: 0 - Asymptomatic  Vitals:   02/17/17 0859  BP: (!) 141/77  Pulse: 81  Resp: 17  Temp: 98.3 F (36.8 C)   Filed Weights   02/17/17 0859  Weight: 134 lb 11.2 oz (61.1 kg)    GENERAL:alert, no distress and comfortable SKIN: skin color, texture, turgor are normal, no rashes or significant lesions EYES: normal, Conjunctiva are pink and non-injected, sclera clear OROPHARYNX:no exudate, no erythema and lips, buccal mucosa, and tongue normal  NECK: supple, thyroid normal size, non-tender, without nodularity LYMPH:  no palpable lymphadenopathy in the cervical, axillary or inguinal LUNGS: clear to auscultation and percussion with normal breathing effort HEART: regular rate & rhythm and no murmurs and no lower extremity edema ABDOMEN:abdomen soft, non-tender and normal bowel sounds Musculoskeletal:no cyanosis of digits and no clubbing  NEURO: alert & oriented x 3 with fluent speech, no focal motor/sensory deficits  LABORATORY DATA:  I have reviewed the data as listed CBC Latest Ref Rng & Units 01/20/2017 11/25/2016 09/30/2016  WBC 3.9 - 10.3 10e3/uL 6.9 4.0 4.9  Hemoglobin 11.6 - 15.9  g/dL 14.0 14.3 14.5  Hematocrit 34.8 - 46.6 % 43.6 42.6 43.8  Platelets 145 - 400 10e3/uL 237 216 237    CMP Latest Ref Rng & Units 01/20/2017 11/25/2016 09/30/2016  Glucose 70 - 140 mg/dl 90 150(H) 109  BUN 7.0 - 26.0 mg/dL 12.9 12.7 12.4  Creatinine 0.6 - 1.1 mg/dL 0.9 0.9 0.9  Sodium 136 - 145 mEq/L 143 144 143  Potassium 3.5 - 5.1 mEq/L 3.7 3.8 3.8  Chloride 98 - 107 mEq/L - - -  CO2 22 - 29 mEq/L 29 26 29   Calcium 8.4 - 10.4 mg/dL 9.6 9.5 9.7  Total Protein 6.4 - 8.3 g/dL 7.8 7.7 7.8  Total Bilirubin 0.20 - 1.20 mg/dL 1.03 0.61 0.72   Alkaline Phos 40 - 150 U/L 64 67 69  AST 5 - 34 U/L 36(H) 61(H) 40(H)  ALT 0 - 55 U/L 31 49 38   Results for MARDELLA, Vanessa A (MRN [id number]) as of 02/17/2017 18:06  Ref. Range 05/22/2015 08:23 07/11/2015 08:40 10/30/2015 08:06 01/22/2016 07:58  Chromogranin A Latest Ref Range: <=15 ng/mL 15 16 (H) 13 14      Ref Range & Units 80mo ago 22mo ago 58mo ago 34yr ago   5-HIAA, Urine Undefined mg/L 5.6  5.1CM  4.0CM  3.5CM   Comment: Total Volume: 0950 mL   5-HIAA,Quant.,24 Hr Urine 0.0 - 14.9 mg/24 hr 5.3  5.1CM  6.0CM  5.3CM      RADIOGRAPHIC STUDIES:  CT Chest, Abdomen w contrast 02/10/2017 IMPRESSION: 1. Multifocal arterial phase liver lesions are stable to slightly progressed in the interval. 2. Stable small peritoneal nodules.  CT CAP W CONTRAST 06/17/16 IMPRESSION: 1. Continued stability of multifocal metastatic disease involving the liver, peritoneum M and bilateral adnexum. No significant disease progression identified. 2. No significant thoracic findings 3. Aortic atherosclerosis 4. Hepatic steatosis.  CT chest, abdomen and pelvis 01/15/2016 IMPRESSION: 1. Continued stability of multifocal metastatic disease involving the liver, peritoneum and bilateral adnexa. No disease progression identified. 2. No significant thoracic findings. 3. Hepatic steatosis. 4.  Aortic Atherosclerosis (ICD10-170.0)   ASSESSMENT & PLAN:  61 y.o. female with  1. Metastatic carcinoid tumor of ileum to liver, peritoneum and adnexa -We previously reviewed the natural history of metastatic carcinoid tumor. She understands this is an incurable disease, and the goal of therapy is disease control and prolong her life. -We also previously reviewed the overall treatment strategy for metastatic carcinoid tumor, she understands her disease will probably progress at some point, and we'll move on to second or third line treatment, such as underlying Everolimus or chemotherapy. -We previously  discussed the role of  liver targeted therapy, and new treatment option 177 Lu-Dotatate in the future if needed  -She has been doing very well clinically, chromogranin A and urine 5 HIAA level are mostly normal. -Restaging CT from 06/17/16 showed stable disease with no significant disease progression identified. I reviewed the scan findings with her -lab results reviewed with her. -She is clinically doing very well, asymptomatic -continue Sandostatin injection q4w. - CT Scan reviewed from 02/10/2017 shows slightly progression in liver. She is asymptomatic, lab results are stable, I recommend continue Sandostatin injection, with close monitoring. We will monitor closer with labs every 2 monthsand scans every 4 months instead. -We discussed that this newly FDA approved Lutathera treatment for neuroendocrine tumor, the logistics and the potential benefits discussed with her. If she has further disease progression on her next staging skin, I'll refer her to make a  medicine to consider Lutathera treatment. - Labs reviewed, unremarkable. kidney/liver functions are all normal.  - I encouraged her to drink more water due to kidney stones  2. Hypokalemia -Probably related to her diarrhea, K 3.7 on 06/10/16. -She is on potassium 1 tablet every other day, we'll continue. Her K has been normal lately   3. HTN -Her blood pressure was elevated. I encouraged her to monitor her blood pressure home. She states her BP has been normal at home and other office  -Follow-up with primary care physician.  4. Cancer screening -She is overdue for mammogram, last mammogram in July 2015. -The patient forgot to schedule her breast mammogram. I encouraged her to reschedule  -She will continue follow up with her PCP and other specialists.   PLAN -lab and scan results reviewed with patient.  -Continue Sandostatin every 4 weeks. -Labs every 2 months. She uses my chart to check her lab results  -F/u in 4 months with CT CAP (with contrast) and lab  1 week prior.   All questions were answered. The patient knows to call the clinic with any problems, questions or concerns. No barriers to learning was detected.  I spent 20 minutes counseling the patient face to face. The total time spent in the appointment was 30 minutes and more than 50% was on counseling and review of test results    This document serves as a record of services personally performed by Truitt Merle, MD. It was created on her behalf by Brandt Loosen, a trained medical scribe. The creation of this record is based on the scribe's personal observations and the provider's statements to them. This document has been checked and approved by the attending provider.  Truitt Merle  02/17/2017

## 2017-02-17 ENCOUNTER — Ambulatory Visit (HOSPITAL_BASED_OUTPATIENT_CLINIC_OR_DEPARTMENT_OTHER): Payer: 59 | Admitting: Hematology

## 2017-02-17 ENCOUNTER — Ambulatory Visit (HOSPITAL_BASED_OUTPATIENT_CLINIC_OR_DEPARTMENT_OTHER): Payer: 59

## 2017-02-17 ENCOUNTER — Encounter: Payer: Self-pay | Admitting: Hematology

## 2017-02-17 ENCOUNTER — Telehealth: Payer: Self-pay | Admitting: Hematology

## 2017-02-17 VITALS — BP 141/77 | HR 81 | Temp 98.3°F | Resp 17 | Ht 62.0 in | Wt 134.7 lb

## 2017-02-17 DIAGNOSIS — I10 Essential (primary) hypertension: Secondary | ICD-10-CM | POA: Diagnosis not present

## 2017-02-17 DIAGNOSIS — C7A012 Malignant carcinoid tumor of the ileum: Secondary | ICD-10-CM

## 2017-02-17 DIAGNOSIS — C7B02 Secondary carcinoid tumors of liver: Secondary | ICD-10-CM

## 2017-02-17 DIAGNOSIS — C7B04 Secondary carcinoid tumors of peritoneum: Secondary | ICD-10-CM

## 2017-02-17 DIAGNOSIS — C7B09 Secondary carcinoid tumors of other sites: Secondary | ICD-10-CM

## 2017-02-17 DIAGNOSIS — E876 Hypokalemia: Secondary | ICD-10-CM | POA: Diagnosis not present

## 2017-02-17 DIAGNOSIS — D3A012 Benign carcinoid tumor of the ileum: Secondary | ICD-10-CM

## 2017-02-17 MED ORDER — OCTREOTIDE ACETATE 30 MG IM KIT
30.0000 mg | PACK | Freq: Once | INTRAMUSCULAR | Status: AC
Start: 2017-02-17 — End: 2017-02-17
  Administered 2017-02-17: 30 mg via INTRAMUSCULAR
  Filled 2017-02-17: qty 1

## 2017-02-17 NOTE — Telephone Encounter (Signed)
Scheduled appt per 8/6 los - Gave patient AVS and calender per los.  Central radiology to contact patient with ct

## 2017-02-17 NOTE — Patient Instructions (Signed)

## 2017-03-18 ENCOUNTER — Ambulatory Visit (HOSPITAL_BASED_OUTPATIENT_CLINIC_OR_DEPARTMENT_OTHER): Payer: 59

## 2017-03-18 VITALS — BP 149/85 | HR 81 | Temp 98.8°F | Resp 18

## 2017-03-18 DIAGNOSIS — C7B04 Secondary carcinoid tumors of peritoneum: Secondary | ICD-10-CM

## 2017-03-18 DIAGNOSIS — C7B09 Secondary carcinoid tumors of other sites: Secondary | ICD-10-CM

## 2017-03-18 DIAGNOSIS — C7B02 Secondary carcinoid tumors of liver: Secondary | ICD-10-CM

## 2017-03-18 DIAGNOSIS — D3A012 Benign carcinoid tumor of the ileum: Secondary | ICD-10-CM

## 2017-03-18 DIAGNOSIS — C7A012 Malignant carcinoid tumor of the ileum: Secondary | ICD-10-CM | POA: Diagnosis not present

## 2017-03-18 MED ORDER — OCTREOTIDE ACETATE 30 MG IM KIT
30.0000 mg | PACK | Freq: Once | INTRAMUSCULAR | Status: AC
  Administered 2017-03-18: 30 mg via INTRAMUSCULAR
  Filled 2017-03-18: qty 1

## 2017-04-04 DIAGNOSIS — Z23 Encounter for immunization: Secondary | ICD-10-CM | POA: Diagnosis not present

## 2017-04-14 ENCOUNTER — Ambulatory Visit: Payer: 59

## 2017-04-14 ENCOUNTER — Other Ambulatory Visit: Payer: 59

## 2017-04-14 ENCOUNTER — Other Ambulatory Visit (HOSPITAL_BASED_OUTPATIENT_CLINIC_OR_DEPARTMENT_OTHER): Payer: 59

## 2017-04-14 ENCOUNTER — Ambulatory Visit (HOSPITAL_BASED_OUTPATIENT_CLINIC_OR_DEPARTMENT_OTHER): Payer: 59

## 2017-04-14 DIAGNOSIS — C7B02 Secondary carcinoid tumors of liver: Secondary | ICD-10-CM

## 2017-04-14 DIAGNOSIS — C7B04 Secondary carcinoid tumors of peritoneum: Secondary | ICD-10-CM | POA: Diagnosis not present

## 2017-04-14 DIAGNOSIS — C7A012 Malignant carcinoid tumor of the ileum: Secondary | ICD-10-CM

## 2017-04-14 DIAGNOSIS — C7B09 Secondary carcinoid tumors of other sites: Secondary | ICD-10-CM | POA: Diagnosis not present

## 2017-04-14 DIAGNOSIS — D3A012 Benign carcinoid tumor of the ileum: Secondary | ICD-10-CM

## 2017-04-14 LAB — COMPREHENSIVE METABOLIC PANEL
ALBUMIN: 4 g/dL (ref 3.5–5.0)
ALK PHOS: 65 U/L (ref 40–150)
ALT: 32 U/L (ref 0–55)
AST: 37 U/L — AB (ref 5–34)
Anion Gap: 10 mEq/L (ref 3–11)
BUN: 10.7 mg/dL (ref 7.0–26.0)
CALCIUM: 9.3 mg/dL (ref 8.4–10.4)
CO2: 27 mEq/L (ref 22–29)
Chloride: 105 mEq/L (ref 98–109)
Creatinine: 0.9 mg/dL (ref 0.6–1.1)
EGFR: 72 mL/min/{1.73_m2} — AB (ref >90)
Glucose: 130 mg/dl (ref 70–140)
POTASSIUM: 4 meq/L (ref 3.5–5.1)
Sodium: 142 mEq/L (ref 136–145)
Total Bilirubin: 0.42 mg/dL (ref 0.20–1.20)
Total Protein: 7.5 g/dL (ref 6.4–8.3)

## 2017-04-14 LAB — CBC WITH DIFFERENTIAL/PLATELET
BASO%: 0.9 % (ref 0.0–2.0)
BASOS ABS: 0.1 10*3/uL (ref 0.0–0.1)
EOS ABS: 0.1 10*3/uL (ref 0.0–0.5)
EOS%: 0.9 % (ref 0.0–7.0)
HEMATOCRIT: 42.8 % (ref 34.8–46.6)
HEMOGLOBIN: 13.8 g/dL (ref 11.6–15.9)
LYMPH#: 2 10*3/uL (ref 0.9–3.3)
LYMPH%: 36.5 % (ref 14.0–49.7)
MCH: 29.9 pg (ref 25.1–34.0)
MCHC: 32.2 g/dL (ref 31.5–36.0)
MCV: 92.8 fL (ref 79.5–101.0)
MONO#: 0.4 10*3/uL (ref 0.1–0.9)
MONO%: 7.2 % (ref 0.0–14.0)
NEUT#: 3.1 10*3/uL (ref 1.5–6.5)
NEUT%: 54.5 % (ref 38.4–76.8)
Platelets: 284 10*3/uL (ref 145–400)
RBC: 4.61 10*6/uL (ref 3.70–5.45)
RDW: 12.5 % (ref 11.2–14.5)
WBC: 5.6 10*3/uL (ref 3.9–10.3)

## 2017-04-14 MED ORDER — OCTREOTIDE ACETATE 30 MG IM KIT
30.0000 mg | PACK | Freq: Once | INTRAMUSCULAR | Status: AC
  Administered 2017-04-14: 30 mg via INTRAMUSCULAR
  Filled 2017-04-14: qty 1

## 2017-04-14 NOTE — Patient Instructions (Signed)

## 2017-04-16 LAB — CHROMOGRANIN A: Chromogranin A: 5 nmol/L (ref 0–5)

## 2017-05-07 ENCOUNTER — Other Ambulatory Visit: Payer: Self-pay | Admitting: Hematology

## 2017-05-08 ENCOUNTER — Telehealth: Payer: Self-pay

## 2017-05-08 NOTE — Telephone Encounter (Signed)
Patient called and left message. She has a scheduled CT for 11-5. The last scan,  [facility] did not authorize the the pelvis. What can we do so this does not happen again?

## 2017-05-09 NOTE — Telephone Encounter (Signed)
Vanessa Waller,  Could you call pt and explain to her the pre-auth process? Thanks   Truitt Merle MD

## 2017-05-12 ENCOUNTER — Ambulatory Visit (HOSPITAL_BASED_OUTPATIENT_CLINIC_OR_DEPARTMENT_OTHER): Payer: 59

## 2017-05-12 VITALS — BP 146/87 | HR 88 | Temp 97.6°F | Resp 18

## 2017-05-12 DIAGNOSIS — C7B02 Secondary carcinoid tumors of liver: Secondary | ICD-10-CM | POA: Diagnosis not present

## 2017-05-12 DIAGNOSIS — C7B04 Secondary carcinoid tumors of peritoneum: Secondary | ICD-10-CM | POA: Diagnosis not present

## 2017-05-12 DIAGNOSIS — C7A012 Malignant carcinoid tumor of the ileum: Secondary | ICD-10-CM

## 2017-05-12 DIAGNOSIS — D3A012 Benign carcinoid tumor of the ileum: Secondary | ICD-10-CM

## 2017-05-12 DIAGNOSIS — C7B09 Secondary carcinoid tumors of other sites: Secondary | ICD-10-CM

## 2017-05-12 MED ORDER — OCTREOTIDE ACETATE 30 MG IM KIT
30.0000 mg | PACK | Freq: Once | INTRAMUSCULAR | Status: AC
  Administered 2017-05-12: 30 mg via INTRAMUSCULAR
  Filled 2017-05-12: qty 1

## 2017-05-12 NOTE — Patient Instructions (Signed)

## 2017-05-13 ENCOUNTER — Telehealth: Payer: Self-pay | Admitting: *Deleted

## 2017-05-13 NOTE — Telephone Encounter (Signed)
Left message for pt to call office.  CT abdomen/pelvis has been approved.

## 2017-05-19 ENCOUNTER — Ambulatory Visit (HOSPITAL_COMMUNITY)
Admission: RE | Admit: 2017-05-19 | Discharge: 2017-05-19 | Disposition: A | Discharge status: Home / Self Care | Payer: 59 | Source: Ambulatory Visit | Attending: Hematology | Admitting: Hematology

## 2017-05-19 ENCOUNTER — Encounter (HOSPITAL_COMMUNITY): Payer: Self-pay

## 2017-05-19 DIAGNOSIS — C7A00 Malignant carcinoid tumor of unspecified site: Secondary | ICD-10-CM | POA: Diagnosis not present

## 2017-05-19 DIAGNOSIS — K760 Fatty (change of) liver, not elsewhere classified: Secondary | ICD-10-CM | POA: Insufficient documentation

## 2017-05-19 DIAGNOSIS — R918 Other nonspecific abnormal finding of lung field: Secondary | ICD-10-CM | POA: Diagnosis not present

## 2017-05-19 DIAGNOSIS — R590 Localized enlarged lymph nodes: Secondary | ICD-10-CM | POA: Diagnosis not present

## 2017-05-19 DIAGNOSIS — K769 Liver disease, unspecified: Secondary | ICD-10-CM | POA: Diagnosis not present

## 2017-05-19 DIAGNOSIS — I700 Atherosclerosis of aorta: Secondary | ICD-10-CM | POA: Diagnosis not present

## 2017-05-19 DIAGNOSIS — D487 Neoplasm of uncertain behavior of other specified sites: Secondary | ICD-10-CM | POA: Diagnosis not present

## 2017-05-19 DIAGNOSIS — C7B09 Secondary carcinoid tumors of other sites: Secondary | ICD-10-CM | POA: Diagnosis present

## 2017-05-19 DIAGNOSIS — N200 Calculus of kidney: Secondary | ICD-10-CM | POA: Insufficient documentation

## 2017-05-19 DIAGNOSIS — C7B04 Secondary carcinoid tumors of peritoneum: Secondary | ICD-10-CM | POA: Diagnosis not present

## 2017-05-19 MED ORDER — IOPAMIDOL (ISOVUE-300) INJECTION 61%
100.0000 mL | Freq: Once | INTRAVENOUS | Status: AC | PRN
  Administered 2017-05-19: 100 mL via INTRAVENOUS

## 2017-05-19 MED ORDER — IOPAMIDOL (ISOVUE-300) INJECTION 61%
INTRAVENOUS | Status: AC
  Administered 2017-05-19: 100 mL via INTRAVENOUS
  Filled 2017-05-19: qty 100

## 2017-05-21 LAB — 5 HIAA, QUANTITATIVE, URINE, 24 HOUR
5-HIAA, Urine: 6.3 mg/L
5-HIAA,QUANT.,24 HR URINE: 9.1 mg/(24.h) (ref 0.0–14.9)

## 2017-05-23 ENCOUNTER — Other Ambulatory Visit: Payer: Self-pay | Admitting: *Deleted

## 2017-05-23 ENCOUNTER — Other Ambulatory Visit (HOSPITAL_BASED_OUTPATIENT_CLINIC_OR_DEPARTMENT_OTHER): Payer: 59

## 2017-05-23 DIAGNOSIS — C7B02 Secondary carcinoid tumors of liver: Secondary | ICD-10-CM | POA: Diagnosis not present

## 2017-05-23 DIAGNOSIS — C7B09 Secondary carcinoid tumors of other sites: Secondary | ICD-10-CM

## 2017-05-23 DIAGNOSIS — C7A00 Malignant carcinoid tumor of unspecified site: Secondary | ICD-10-CM

## 2017-05-23 DIAGNOSIS — D3A012 Benign carcinoid tumor of the ileum: Secondary | ICD-10-CM

## 2017-05-23 DIAGNOSIS — C7B04 Secondary carcinoid tumors of peritoneum: Secondary | ICD-10-CM | POA: Diagnosis not present

## 2017-05-23 LAB — COMPREHENSIVE METABOLIC PANEL
ALBUMIN: 4 g/dL (ref 3.5–5.0)
ALK PHOS: 68 U/L (ref 40–150)
ALT: 36 U/L (ref 0–55)
AST: 43 U/L — ABNORMAL HIGH (ref 5–34)
Anion Gap: 8 mEq/L (ref 3–11)
BUN: 9.3 mg/dL (ref 7.0–26.0)
CALCIUM: 9.6 mg/dL (ref 8.4–10.4)
CHLORIDE: 105 meq/L (ref 98–109)
CO2: 31 mEq/L — ABNORMAL HIGH (ref 22–29)
CREATININE: 0.9 mg/dL (ref 0.6–1.1)
EGFR: >60 mL/min/{1.73_m2} (ref >60)
GLUCOSE: 94 mg/dL (ref 70–140)
Potassium: 4.2 mEq/L (ref 3.5–5.1)
SODIUM: 144 meq/L (ref 136–145)
Total Bilirubin: 0.43 mg/dL (ref 0.20–1.20)
Total Protein: 8 g/dL (ref 6.4–8.3)

## 2017-05-23 LAB — CBC WITH DIFFERENTIAL/PLATELET
BASO%: 0.7 % (ref 0.0–2.0)
Basophils Absolute: 0 10*3/uL (ref 0.0–0.1)
EOS%: 0.7 % (ref 0.0–7.0)
Eosinophils Absolute: 0 10*3/uL (ref 0.0–0.5)
HEMATOCRIT: 44 % (ref 34.8–46.6)
HEMOGLOBIN: 14.1 g/dL (ref 11.6–15.9)
LYMPH#: 2.4 10*3/uL (ref 0.9–3.3)
LYMPH%: 41.7 % (ref 14.0–49.7)
MCH: 30 pg (ref 25.1–34.0)
MCHC: 32 g/dL (ref 31.5–36.0)
MCV: 93.6 fL (ref 79.5–101.0)
MONO#: 0.5 10*3/uL (ref 0.1–0.9)
MONO%: 8.3 % (ref 0.0–14.0)
NEUT#: 2.8 10*3/uL (ref 1.5–6.5)
NEUT%: 48.6 % (ref 38.4–76.8)
Platelets: 285 10*3/uL (ref 145–400)
RBC: 4.7 10*6/uL (ref 3.70–5.45)
RDW: 12.4 % (ref 11.2–14.5)
WBC: 5.7 10*3/uL (ref 3.9–10.3)

## 2017-05-26 LAB — CHROMOGRANIN A: Chromogranin A: 4 nmol/L (ref 0–5)

## 2017-06-02 ENCOUNTER — Other Ambulatory Visit: Payer: 59

## 2017-06-04 NOTE — Progress Notes (Signed)
[facility] OFFICE PROGRESS NOTE  Patient Care Team: [name], Hershal Coria as PCP - General (Physician Assistant)  DIAGNOSIS: 1. 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  2. 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 The patient underwent surgical resection of her carcinoid tumor on 08/17/2005. Two out of 13 lymph nodes were positive. The patient's stage was III. The patient was noted to have an elevated urinary 5HIAA level in July 2011. A positive octreotide scan was noted coming from the right lower pelvis adjacent to the bladder on 09/06/2010. Initially this was felt to be a uterine fibroid. The patient has continued to have positive octreotide scan on 11/07/2011 and 05/06/2012. CT scans of the abdomen and pelvis were also carried out on 03/02/2010, 02/24/2012 and 06/30/2012. These scans in conjunction with an elevated 5HIAA level of 22.7 on 04/13/2012 and a chromogranin A level which had risen to 18.0 on 03/23/2012 strongly suggested that the patient's disease had recurred. The patient required admission to the hospital from 06/30/2012 through 07/03/2012 for an episode of upper abdominal pain which occurred suddenly the day prior to admission. The patient was started on somatostatin LAR 30 mg IM on 07/07/2012 for recurrent disease involving the lateral segment of the left hepatic lobe, a right jejunal mesenteric node and 2 enhancing masses adjacent to the uterus. Twenty-four-hour urine 5HIAA on 10/19/2012 was 5.5. CT scan of the abdomen and pelvis with IV contrast on 10/19/2012 showed generally stable disease with the exception of a decrease in the right abdominal mesenteric lymphadenopathy. There have been 2 lesions in the liver which are felt to be stable. One lesion is in the lateral segment of  the left hepatic lobe measuring 14 x 14 mm. The second lesion is seen in the lateral segment adjacent to the falciform ligament and measures 9 mm.  2. Uterine fibroids noted on imaging studies dating back to late April 2010.  3. Elevated liver transaminases noted in March 2007. The patient was evaluated by Dr. Delfin Edis in October 2010 and felt that the etiology was hepatic steatosis.  4. Admission to the hospital for upper abdominal pain with sudden onset. Admission dates were 06/30/2012 through 07/03/2012. CT scan of the abdomen and pelvis with IV contrast on 06/30/2012 showed no definite etiology for the pain. It was noted that a lymph node in the right jejunal mesentery had developed central necrosis when compared with the prior study of 02/24/2012. There was no evidence for bowel obstruction. There has previously been no evidence for gallstones on recent CT scans, and an abdominal ultrasound dating back to 05/08/2009.  5. Possible mass involving the left breast on screening mammogram from 06/18/2012. A diagnostic mammogram and ultrasound of the left breast carried out on 07/17/2012 were normal. Followup screening mammogram was suggested in 1 year. 6. Diarrhea with onset in December 2013, improved with treatment and use Imodium as needed.   CURRENT THERAPY:  Sandostatin LAR 30 mg IM every 4 weeks, started on 07/07/2012    INTERVAL HISTORY:  Mrs. Kerwin returns for follow up. She presents to the clinic today noting anything new, no new pain, her diarrhea is stable and denies and flushing. She is still taking potassium and would like a refill. She used imodium recently due to her Thanksgiving meal but usually uses it as needed. She  has not had a mammogram regularly but is on her list of things to do. She already had her flu shot for the year.      REVIEW OF SYSTEMS:   Constitutional: Denies fevers, chills or abnormal weight loss  Eyes: Denies blurriness of vision Ears, nose, mouth,  throat, and face: Denies mucositis or sore throat Respiratory: Denies cough, dyspnea or wheezes Cardiovascular: Denies palpitation, chest discomfort or lower extremity swelling Gastrointestinal:  Denies nausea, heartburn. (+)occasional diarrhea, stable  Skin: Denies abnormal skin rashes Lymphatics: Denies new lymphadenopathy or easy bruising Neurological:Denies numbness, tingling or new weaknesses Behavioral/Psych: Mood is stable, no new changes  All other systems were reviewed with the patient and are negative.  MEDICAL HISTORY:  Past Medical History:  Diagnosis Date  . 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 carcinoid tumor dx'd 08/2005  . Diverticulosis   . Hepatic steatosis 02/24/12  . Hypopotassemia   . 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 ([place])    liver,mesentery,pelvis  . Nephrolithiasis   . Nonspecific elevation of levels of transaminase or lactic acid dehydrogenase (LDH)   . Small bowel obstruction ([place])   . Uterine fibroid     SURGICAL HISTORY: Past Surgical History:  Procedure Laterality Date  . carcinoid tumor resection     ileum  . TUBAL LIGATION      I have reviewed the social history and family history with the patient and they are unchanged from previous note.  ALLERGIES:  is allergic to cephalexin and amoxicillin.  MEDICATIONS:  Current Outpatient Medications  Medication Sig Dispense Refill  . ascorbic acid (VITAMIN C) 500 MG tablet Take 500 mg by mouth daily.    . Calcium Carbonate-Vitamin D (CALCIUM 600-D PO) Take 1 tablet by mouth daily.    . Cholecalciferol (VITAMIN D-3) 5000 UNITS TABS Take 1 tablet by mouth daily.    [name] loperamide (IMODIUM) 2 MG capsule Take by mouth as needed for diarrhea or loose stools.    . Multiple Vitamin (MULTIVITAMIN) tablet Take 1 tablet by mouth daily.    [name] octreotide (SANDOSTATIN LAR) 30 MG injection Inject 30 mg into the muscle every 28 (twenty-eight) days.    . potassium chloride SA (K-DUR,KLOR-CON)  20 MEQ tablet Take 1 tablet (20 mEq total) by mouth every other day. 90 tablet 1   No current facility-administered medications for this visit.     PHYSICAL EXAMINATION:  ECOG PERFORMANCE STATUS: 0 - Asymptomatic  Vitals:   06/09/17 0816  BP: (!) 145/73  Pulse: 92  Resp: 20  Temp: 98.3 F (36.8 C)  SpO2: 99%   Filed Weights   06/09/17 0816  Weight: 134 lb 9.6 oz (61.1 kg)    GENERAL:alert, no distress and comfortable SKIN: skin color, texture, turgor are normal, no rashes or significant lesions EYES: normal, Conjunctiva are pink and non-injected, sclera clear OROPHARYNX:no exudate, no erythema and lips, buccal mucosa, and tongue normal  NECK: supple, thyroid normal size, non-tender, without nodularity LYMPH:  no palpable lymphadenopathy in the cervical, axillary or inguinal LUNGS: clear to auscultation and percussion with normal breathing effort HEART: regular rate & rhythm and no murmurs and no lower extremity edema ABDOMEN:abdomen soft, non-tender and normal bowel sounds Musculoskeletal:no cyanosis of digits and no clubbing  NEURO: alert & oriented x 3 with fluent speech, no focal motor/sensory deficits  LABORATORY DATA:  I have reviewed the data as listed CBC Latest Ref Rng & Units 05/23/2017 04/14/2017 01/20/2017  WBC 3.9 - 10.3 10e3/uL 5.7 5.6  6.9  Hemoglobin 11.6 - 15.9 g/dL 14.1 13.8 14.0  Hematocrit 34.8 - 46.6 % 44.0 42.8 43.6  Platelets 145 - 400 10e3/uL 285 284 237    CMP Latest Ref Rng & Units 05/23/2017 04/14/2017 01/20/2017  Glucose 70 - 140 mg/dl 94 130 90  BUN 7.0 - 26.0 mg/dL 9.3 10.7 12.9  Creatinine 0.6 - 1.1 mg/dL 0.9 0.9 0.9  Sodium 136 - 145 mEq/L 144 142 143  Potassium 3.5 - 5.1 mEq/L 4.2 4.0 3.7  Chloride 98 - 107 mEq/L - - -  CO2 22 - 29 mEq/L 31(H) 27 29  Calcium 8.4 - 10.4 mg/dL 9.6 9.3 9.6  Total Protein 6.4 - 8.3 g/dL 8.0 7.5 7.8  Total Bilirubin 0.20 - 1.20 mg/dL 0.43 0.42 1.03  Alkaline Phos 40 - 150 U/L 68 65 64  AST 5 - 34 U/L 43(H)  37(H) 36(H)  ALT 0 - 55 U/L 36 32 31   Chromogranin A 08/05/16: 5 09/30/16: 5 11/25/16: 3 01/20/17: 5 04/14/17: 5 05/23/17: 4   Results for CHEMEKA, FILICE A (MRN [id number]) as of 06/04/2017 08:26  Ref. Range 06/17/2016 08:13 12/23/2016 08:59 05/19/2017 08:04  5-HIAA,Quant.,24 Hr Urine Latest Ref Range: 0.0 - 14.9 mg/24 hr 5.1 5.3 9.1  5-HIAA, Urine Latest Ref Range: Undefined mg/L 5.1 5.6 6.3     RADIOGRAPHIC STUDIES:  CT CAP W Contrast 05/19/17  IMPRESSION: 1. Minimal disease progression. Hypervascular liver lesions, some of which have enlarged minimally. Peritoneal metastasis, with mild progression of the dominant right adnexal mass compared to 06/17/16. Slight enlargement of a mesenteric node. 2. No new sites of disease identified. 3.  Aortic Atherosclerosis (ICD10-I70.0). 4. Hepatic steatosis. 5.  No acute process or evidence of metastatic disease in the chest. 6. Right nephrolithiasis.  CT Chest, Abdomen w contrast 02/10/2017 IMPRESSION: 1. Multifocal arterial phase liver lesions are stable to slightly progressed in the interval. 2. Stable small peritoneal nodules.  CT CAP W CONTRAST 06/17/16 IMPRESSION: 1. Continued stability of multifocal metastatic disease involving the liver, peritoneum M and bilateral adnexum. No significant disease progression identified. 2. No significant thoracic findings 3. Aortic atherosclerosis 4. Hepatic steatosis.  CT chest, abdomen and pelvis 01/15/2016 IMPRESSION: 1. Continued stability of multifocal metastatic disease involving the liver, peritoneum and bilateral adnexa. No disease progression identified. 2. No significant thoracic findings. 3. Hepatic steatosis. 4.  Aortic Atherosclerosis (ICD10-170.0)   ASSESSMENT & PLAN:  61 y.o. female with  1. Metastatic carcinoid tumor of ileum to liver, peritoneum and adnexa -We previously reviewed the natural history of metastatic carcinoid tumor. She understands this is an incurable disease, and  the goal of therapy is disease control and prolong her life. -We also previously reviewed the overall treatment strategy for metastatic carcinoid tumor, she understands her disease will probably progress at some point, and we'll move on to second or third line treatment, such as underlying Everolimus or chemotherapy. -We previously  discussed the role of liver targeted therapy, and new treatment option 177 Lu-Dotatate in the future if needed  -She has been doing very well clinically, chromogranin A and urine 5 HIAA level are mostly normal. -Restaging CT from 06/17/16 showed stable disease with no significant disease progression identified. I reviewed the scan findings with her -lab results reviewed with her. -She is clinically doing very well, asymptomatic -continue Sandostatin injection q4w. - CT Scan reviewed from 02/10/2017 shows slightly progression in liver. She is asymptomatic, lab results are stable, I recommend continue Sandostatin injection, with close monitoring. We will  monitor closer with labs every 2 months and scans every 4 months instead. -We discussed that this newly FDA approved Lutathera treatment for neuroendocrine tumor, the logistics and the potential benefits discussed with her. If she has further disease progression on her next staging skin, I'll refer her to make a medicine to consider Lutathera treatment. -We discussed the CT CAP from 05/19/17 shows mild disease progression in her known lesions. No new sites of disease identified. I compared to her previous scan since 2013, I think that she has had overall disease progression, especially in the liver.  I would like to review her scan in the GI Tumor board this week. I suggest before her next Sandostatin injection she get a Dotatate PET scan to get a better view of her disease. -Due to her progression on Sandostatin I discussed the possibility of changing her therapy to [place] treatment every 2 months for 4 injections. I gave her  reading material on this treatment. I will discuss this with Dr. Ilda Foil.  Due to the entrance of her malignancy, and overall mild disease progression, not urgent to change her treatment right now, will probably change in the next 2-3 months. -05/23/17 labs reviewed, Exam unremarkable, she is doing well clinically, overall asymptomatic. She will proceed with Sandostatin injection today.  -She knows to drink more water due to kidney stones -F/u in 1 month   2. Hypokalemia -Probably related to her diarrhea, K 3.7 on 06/10/16. -She is on potassium 1 tablet every other day, we'll continue. Her K has been normal lately  -Refilled her potassium today (06/09/17)  3. HTN -Her blood pressure was elevated. I encouraged her to monitor her blood pressure home. She states her BP has been normal at home and other office  -Follow-up with primary care physician.  4. Cancer screening -She is overdue for mammogram, last mammogram in July 2015. -The patient forgot to schedule her breast mammogram. I encouraged her to reschedule  -She will continue follow up with her PCP and other specialists.   PLAN -Refilled Potassium today  -Sandostatin today  -F/u and injection in 4 weeks with Dotatate-PET scan a few days ago   All questions were answered. The patient knows to call the clinic with any problems, questions or concerns. No barriers to learning was detected.  I spent 20 minutes counseling the patient face to face. The total time spent in the appointment was 30 minutes and more than 50% was on counseling and review of test results    This document serves as a record of services personally performed by Truitt Merle, MD. It was created on her behalf by Joslyn Devon, a trained medical scribe. The creation of this record is based on the scribe's personal observations and the provider's statements to them.    I have reviewed the above documentation for accuracy and completeness, and I agree with the  above.  Truitt Merle  06/09/2017

## 2017-06-09 ENCOUNTER — Telehealth: Payer: Self-pay | Admitting: Hematology

## 2017-06-09 ENCOUNTER — Ambulatory Visit (HOSPITAL_BASED_OUTPATIENT_CLINIC_OR_DEPARTMENT_OTHER): Payer: 59 | Admitting: Hematology

## 2017-06-09 ENCOUNTER — Encounter: Payer: Self-pay | Admitting: Hematology

## 2017-06-09 ENCOUNTER — Ambulatory Visit (HOSPITAL_BASED_OUTPATIENT_CLINIC_OR_DEPARTMENT_OTHER): Payer: 59

## 2017-06-09 VITALS — BP 145/73 | HR 92 | Temp 98.3°F | Resp 20 | Ht 62.0 in | Wt 134.6 lb

## 2017-06-09 DIAGNOSIS — E876 Hypokalemia: Secondary | ICD-10-CM | POA: Diagnosis not present

## 2017-06-09 DIAGNOSIS — C7B02 Secondary carcinoid tumors of liver: Secondary | ICD-10-CM | POA: Diagnosis not present

## 2017-06-09 DIAGNOSIS — C7A00 Malignant carcinoid tumor of unspecified site: Secondary | ICD-10-CM

## 2017-06-09 DIAGNOSIS — C7B03 Secondary carcinoid tumors of bone: Secondary | ICD-10-CM | POA: Diagnosis not present

## 2017-06-09 DIAGNOSIS — C7B09 Secondary carcinoid tumors of other sites: Secondary | ICD-10-CM

## 2017-06-09 DIAGNOSIS — C7B04 Secondary carcinoid tumors of peritoneum: Secondary | ICD-10-CM

## 2017-06-09 DIAGNOSIS — I10 Essential (primary) hypertension: Secondary | ICD-10-CM | POA: Diagnosis not present

## 2017-06-09 DIAGNOSIS — D3A012 Benign carcinoid tumor of the ileum: Secondary | ICD-10-CM

## 2017-06-09 DIAGNOSIS — R197 Diarrhea, unspecified: Secondary | ICD-10-CM | POA: Diagnosis not present

## 2017-06-09 MED ORDER — POTASSIUM CHLORIDE CRYS ER 20 MEQ PO TBCR: 20.0000 meq | EXTENDED_RELEASE_TABLET | ORAL | 1 refills | Status: DC

## 2017-06-09 MED ORDER — OCTREOTIDE ACETATE 30 MG IM KIT
30.0000 mg | PACK | Freq: Once | INTRAMUSCULAR | Status: AC
  Administered 2017-06-09: 30 mg via INTRAMUSCULAR
  Filled 2017-06-09: qty 1

## 2017-06-09 NOTE — Patient Instructions (Signed)

## 2017-06-09 NOTE — Telephone Encounter (Signed)
Scheduled appt per 11/26 los - Gave patient AVS and calender per los 

## 2017-06-10 ENCOUNTER — Encounter: Payer: Self-pay | Admitting: Genetics

## 2017-06-11 ENCOUNTER — Telehealth: Payer: Self-pay

## 2017-06-11 NOTE — Telephone Encounter (Signed)
Called patient per Dr. Ernestina Penna request to update patient on Tumor Board discussion (06/11/17).  Patient understands that the liver lesion has increased in size but there is no change in treatment plan at this point. Patient understands that she will be scheduled for a Dotatate PET scan and that she will see Dr. Burr Medico on 07/07/17 for f/u and discussion.

## 2017-06-12 ENCOUNTER — Telehealth: Payer: Self-pay

## 2017-06-12 NOTE — Telephone Encounter (Signed)
Called patient to let her know that her PET scan had been approved. Patient has scheduling concerns and will call [facility] [phone] to schedule her appointment.  Patient encouraged to call me with questions or concerns with scheduling.

## 2017-06-13 ENCOUNTER — Telehealth: Payer: Self-pay | Admitting: Medical Oncology

## 2017-06-13 NOTE — Telephone Encounter (Signed)
I called [facility] and radiology answered and said [facility] has gone for today so I cannot schedule her PET .  Pt notified that nurse is working on scheduling PET.

## 2017-06-16 ENCOUNTER — Telehealth: Payer: Self-pay

## 2017-06-16 DIAGNOSIS — L570 Actinic keratosis: Secondary | ICD-10-CM | POA: Diagnosis not present

## 2017-06-16 DIAGNOSIS — L821 Other seborrheic keratosis: Secondary | ICD-10-CM | POA: Diagnosis not present

## 2017-06-16 DIAGNOSIS — D225 Melanocytic nevi of trunk: Secondary | ICD-10-CM | POA: Diagnosis not present

## 2017-06-16 NOTE — Telephone Encounter (Signed)
Called patient to let her know that I am working on getting her Dotatate scan scheduled and will call her with apt. time and date.

## 2017-06-17 ENCOUNTER — Telehealth: Payer: Self-pay

## 2017-06-17 NOTE — Telephone Encounter (Signed)
Called and scheduled Dotatate scan for 07/09/17 @ 7 AM. I called and left message with patient instructing her to arrive at 6:30 AM, remain NPO for two hours prior to the scan and to hydrate on 07/08/17. I also let patient know that her appointment and injection would be moved to the 26th as well and that we would call her with the time.

## 2017-06-18 NOTE — Progress Notes (Signed)
  Oncology Nurse Navigator Documentation  Navigator Location: [facility] (06/18/17 1506)   )Navigator Encounter Type: Telephone (06/18/17 1506) Telephone: Incoming Call;Outgoing Call;Appt Confirmation/Clarification (06/18/17 1506)    Patient called wanting to cancel her upcoming Dotatate scan and appointment with Dr. Burr Medico. Ms. Nairn also wanted to move her injection on 12/26/128 to later in the day. I spoke with Dr. Burr Medico who is aware of the patient's request. Patient would like to push her appointment with Dr. Burr Medico back to February. I will send a scheduling message.                   Barriers/Navigation Needs: Coordination of Care (06/18/17 1506)   Interventions: Other(Assistance with appointments) (06/18/17 1506)            Acuity: Level 2 (06/18/17 1506)         Time Spent with Patient: 45 (06/18/17 1506)

## 2017-06-19 ENCOUNTER — Telehealth: Payer: Self-pay | Admitting: Hematology

## 2017-06-19 NOTE — Telephone Encounter (Signed)
Left message re appt

## 2017-07-07 ENCOUNTER — Ambulatory Visit: Payer: 59

## 2017-07-07 ENCOUNTER — Ambulatory Visit: Payer: 59 | Admitting: Hematology

## 2017-07-09 ENCOUNTER — Other Ambulatory Visit (HOSPITAL_COMMUNITY): Payer: 59

## 2017-07-09 ENCOUNTER — Ambulatory Visit (HOSPITAL_BASED_OUTPATIENT_CLINIC_OR_DEPARTMENT_OTHER): Payer: 59

## 2017-07-09 ENCOUNTER — Ambulatory Visit: Payer: 59

## 2017-07-09 VITALS — BP 148/92 | HR 74 | Temp 98.0°F | Resp 18

## 2017-07-09 DIAGNOSIS — C7B04 Secondary carcinoid tumors of peritoneum: Secondary | ICD-10-CM | POA: Diagnosis not present

## 2017-07-09 DIAGNOSIS — C7B02 Secondary carcinoid tumors of liver: Secondary | ICD-10-CM | POA: Diagnosis not present

## 2017-07-09 DIAGNOSIS — C7A00 Malignant carcinoid tumor of unspecified site: Secondary | ICD-10-CM | POA: Diagnosis not present

## 2017-07-09 DIAGNOSIS — D3A012 Benign carcinoid tumor of the ileum: Secondary | ICD-10-CM

## 2017-07-09 DIAGNOSIS — C7B09 Secondary carcinoid tumors of other sites: Secondary | ICD-10-CM

## 2017-07-09 MED ORDER — OCTREOTIDE ACETATE 30 MG IM KIT
30.0000 mg | PACK | Freq: Once | INTRAMUSCULAR | Status: AC
  Administered 2017-07-09: 30 mg via INTRAMUSCULAR

## 2017-07-09 MED ORDER — OCTREOTIDE ACETATE 30 MG IM KIT
PACK | INTRAMUSCULAR | Status: AC
  Filled 2017-07-09: qty 1

## 2017-07-28 ENCOUNTER — Telehealth: Payer: Self-pay | Admitting: *Deleted

## 2017-07-29 ENCOUNTER — Telehealth: Payer: Self-pay | Admitting: Hematology

## 2017-07-29 NOTE — Telephone Encounter (Signed)
Pt called yest & req that Dotatate scan be moved to Feb.  She is very busy with her job now & Feb will be better.  She also needed inj scheduled.  Message to [place]. Will leave message for desk RN to call [facility] to schedule.

## 2017-07-29 NOTE — Telephone Encounter (Signed)
Spoke to patient regarding upcoming January through March appointments per 1/14 sch message

## 2017-07-31 ENCOUNTER — Telehealth: Payer: Self-pay | Admitting: *Deleted

## 2017-07-31 NOTE — Telephone Encounter (Signed)
Spoke with Joellen Jersey in  [place] for rescheduled PET Dotatate for Feb.  Per Joellen Jersey, pt is scheduled for Aug 20, 2017 at  7 AM;  Arriving at  630 AM.   Pt to be NPO  2 hours prior to scan.   Needs to hydrate well with water 24 hours before scan. Pt can get injection on  08/27/17 after office visit with Dr. Burr Medico. Per Joellen Jersey,  Pt has to be off Sandostatin injection  4 - 6 weeks before scan can be done.  Last injection was on  07/09/17. Pt understood that injection appt for  08/06/17 will be cancelled. Informed pt that once insurance approves PET Dotatate,  Pt will be contacted with above info.  Pt voiced understanding.

## 2017-07-31 NOTE — Telephone Encounter (Signed)
Spoke with Joellen Jersey and gave her insurance approval number: [id number] Exp  09/14/17.  Called pt at work per pt's request, and left message on voice mail of below instructions.

## 2017-08-06 ENCOUNTER — Ambulatory Visit: Payer: 59

## 2017-08-20 ENCOUNTER — Encounter (HOSPITAL_COMMUNITY)
Admission: RE | Admit: 2017-08-20 | Discharge: 2017-08-20 | Disposition: A | Discharge status: Home / Self Care | Payer: 59 | Source: Ambulatory Visit | Attending: Hematology | Admitting: Hematology

## 2017-08-20 DIAGNOSIS — C7B09 Secondary carcinoid tumors of other sites: Secondary | ICD-10-CM | POA: Insufficient documentation

## 2017-08-20 DIAGNOSIS — C7A1 Malignant poorly differentiated neuroendocrine tumors: Secondary | ICD-10-CM | POA: Diagnosis not present

## 2017-08-20 MED ORDER — GALLIUM GA 68 DOTATATE IV KIT
3.4000 | PACK | Freq: Once | INTRAVENOUS | Status: AC
  Administered 2017-08-20: 3.4 via INTRAVENOUS

## 2017-08-24 NOTE — Progress Notes (Signed)
[place] OFFICE PROGRESS NOTE  Patient Care Team: [name], Hershal Coria as PCP - General (Physician Assistant)  DIAGNOSIS: 1. 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  2. 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 The patient underwent surgical resection of her carcinoid tumor on 08/17/2005. Two out of 13 lymph nodes were positive. The patient's stage was III. The patient was noted to have an elevated urinary 5HIAA level in July 2011. A positive octreotide scan was noted coming from the right lower pelvis adjacent to the bladder on 09/06/2010. Initially this was felt to be a uterine fibroid. The patient has continued to have positive octreotide scan on 11/07/2011 and 05/06/2012. CT scans of the abdomen and pelvis were also carried out on 03/02/2010, 02/24/2012 and 06/30/2012. These scans in conjunction with an elevated 5HIAA level of 22.7 on 04/13/2012 and a chromogranin A level which had risen to 18.0 on 03/23/2012 strongly suggested that the patient's disease had recurred. The patient required admission to the hospital from 06/30/2012 through 07/03/2012 for an episode of upper abdominal pain which occurred suddenly the day prior to admission. The patient was started on somatostatin LAR 30 mg IM on 07/07/2012 for recurrent disease involving the lateral segment of the left hepatic lobe, a right jejunal mesenteric node and 2 enhancing masses adjacent to the uterus. Twenty-four-hour urine 5HIAA on 10/19/2012 was 5.5. CT scan of the abdomen and pelvis with IV contrast on 10/19/2012 showed generally stable disease with the exception of a decrease in the right abdominal mesenteric lymphadenopathy. There have been 2 lesions in the liver which are felt to be stable. One lesion is in the lateral segment of  the left hepatic lobe measuring 14 x 14 mm. The second lesion is seen in the lateral segment adjacent to the falciform ligament and measures 9 mm.  2. Uterine fibroids noted on imaging studies dating back to late April 2010.  3. Elevated liver transaminases noted in March 2007. The patient was evaluated by Dr. Delfin Edis in October 2010 and felt that the etiology was hepatic steatosis.  4. Admission to the hospital for upper abdominal pain with sudden onset. Admission dates were 06/30/2012 through 07/03/2012. CT scan of the abdomen and pelvis with IV contrast on 06/30/2012 showed no definite etiology for the pain. It was noted that a lymph node in the right jejunal mesentery had developed central necrosis when compared with the prior study of 02/24/2012. There was no evidence for bowel obstruction. There has previously been no evidence for gallstones on recent CT scans, and an abdominal ultrasound dating back to 05/08/2009.  5. Possible mass involving the left breast on screening mammogram from 06/18/2012. A diagnostic mammogram and ultrasound of the left breast carried out on 07/17/2012 were normal. Followup screening mammogram was suggested in 1 year. 6. Diarrhea with onset in December 2013, improved with treatment and use Imodium as needed.   CURRENT THERAPY:  Sandostatin LAR 30 mg IM every 4 weeks, started on 07/07/2012. Sandostatin LAR increased to 60 mg IM every 4 weeks on 08/27/2017.   INTERVAL HISTORY:  Vanessa Waller returns for follow up. She notes that she is doing well overall. She reports that when she was first diagnosed, she was going to have a total hysterectomy, but reports that due to the increase of spread, the surgery  was put off.   Since her last visit to the office, she underwent PET scan on 08/20/17 with results of: Intense radiotracer activity associated with multiple hyperdense liver lesions consistent with well differentiated neuroendocrine tumor hepatic metastasis. Multiple  small foci peritoneal metastasis within the lower abdomen and pelvis. Large mass associated the RIGHT adnexa and small lesions in LEFT adnexa also with intense radiotracer activity consistent with neuroendocrine tumor metastasis to the adnexa. No evidence of pulmonary metastasis or skeletal metastasis.  On review of systems, pt reports occasional diarrhea. She denies appetite changes and any other symptoms.   REVIEW OF SYSTEMS:   Constitutional: Denies fevers, chills or abnormal weight loss  Eyes: Denies blurriness of vision Ears, nose, mouth, throat, and face: Denies mucositis or sore throat Respiratory: Denies cough, dyspnea or wheezes Cardiovascular: Denies palpitation, chest discomfort or lower extremity swelling Gastrointestinal:  Denies nausea, heartburn. (+)occasional diarrhea, stable  Skin: Denies abnormal skin rashes Lymphatics: Denies new lymphadenopathy or easy bruising Neurological:Denies numbness, tingling or new weaknesses Behavioral/Psych: Mood is stable, no new changes  All other systems were reviewed with the patient and are negative.  MEDICAL HISTORY:  Past Medical History:  Diagnosis Date  . 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 carcinoid tumor dx'd 08/2005  . Diverticulosis   . Hepatic steatosis 02/24/12  . Hypopotassemia   . 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 ([place])    liver,mesentery,pelvis  . Nephrolithiasis   . Nonspecific elevation of levels of transaminase or lactic acid dehydrogenase (LDH)   . Small bowel obstruction ([place])   . Uterine fibroid     SURGICAL HISTORY: Past Surgical History:  Procedure Laterality Date  . carcinoid tumor resection     ileum  . TUBAL LIGATION      I have reviewed the social history and family history with the patient and they are unchanged from previous note.  ALLERGIES:  is allergic to cephalexin and amoxicillin.  MEDICATIONS:  Current Outpatient Medications  Medication Sig Dispense Refill  .  ascorbic acid (VITAMIN C) 500 MG tablet Take 500 mg by mouth daily.    . Calcium Carbonate-Vitamin D (CALCIUM 600-D PO) Take 1 tablet by mouth daily.    . Cholecalciferol (VITAMIN D-3) 5000 UNITS TABS Take 1 tablet by mouth daily.    [name] loperamide (IMODIUM) 2 MG capsule Take by mouth as needed for diarrhea or loose stools.    . Multiple Vitamin (MULTIVITAMIN) tablet Take 1 tablet by mouth daily.    [name] octreotide (SANDOSTATIN LAR) 30 MG injection Inject 30 mg into the muscle every 28 (twenty-eight) days.    . potassium chloride SA (K-DUR,KLOR-CON) 20 MEQ tablet Take 1 tablet (20 mEq total) by mouth every other day. 90 tablet 1   No current facility-administered medications for this visit.     PHYSICAL EXAMINATION:  ECOG PERFORMANCE STATUS: 0 - Asymptomatic  Vitals:   08/27/17 0819  BP: (!) 144/76  Pulse: 80  Resp: 20  Temp: 98.1 F (36.7 C)  SpO2: 99%   Filed Weights   08/27/17 0819  Weight: 137 lb 1.6 oz (62.2 kg)    GENERAL:alert, no distress and comfortable SKIN: skin color, texture, turgor are normal, no rashes or significant lesions EYES: normal, Conjunctiva are pink and non-injected, sclera clear OROPHARYNX:no exudate, no erythema and lips, buccal mucosa, and tongue normal  NECK: supple, thyroid normal size, non-tender, without nodularity LYMPH:  no palpable lymphadenopathy in the cervical, axillary or inguinal LUNGS: clear to auscultation and percussion with  normal breathing effort HEART: regular rate & rhythm and no murmurs and no lower extremity edema ABDOMEN:abdomen soft, non-tender and normal bowel sounds. No hepatomegaly.  Musculoskeletal:no cyanosis of digits and no clubbing  NEURO: alert & oriented x 3 with fluent speech, no focal motor/sensory deficits  LABORATORY DATA:  I have reviewed the data as listed CBC Latest Ref Rng & Units 05/23/2017 04/14/2017 01/20/2017  WBC 3.9 - 10.3 10e3/uL 5.7 5.6 6.9  Hemoglobin 11.6 - 15.9 g/dL 14.1 13.8 14.0  Hematocrit 34.8 -  46.6 % 44.0 42.8 43.6  Platelets 145 - 400 10e3/uL 285 284 237    CMP Latest Ref Rng & Units 05/23/2017 04/14/2017 01/20/2017  Glucose 70 - 140 mg/dl 94 130 90  BUN 7.0 - 26.0 mg/dL 9.3 10.7 12.9  Creatinine 0.6 - 1.1 mg/dL 0.9 0.9 0.9  Sodium 136 - 145 mEq/L 144 142 143  Potassium 3.5 - 5.1 mEq/L 4.2 4.0 3.7  Chloride 98 - 107 mEq/L - - -  CO2 22 - 29 mEq/L 31(H) 27 29  Calcium 8.4 - 10.4 mg/dL 9.6 9.3 9.6  Total Protein 6.4 - 8.3 g/dL 8.0 7.5 7.8  Total Bilirubin 0.20 - 1.20 mg/dL 0.43 0.42 1.03  Alkaline Phos 40 - 150 U/L 68 65 64  AST 5 - 34 U/L 43(H) 37(H) 36(H)  ALT 0 - 55 U/L 36 32 31   Chromogranin A 08/05/16: 5 09/30/16: 5 11/25/16: 3 01/20/17: 5 04/14/17: 5 05/23/17: 4   Results for Vanessa Waller, Vanessa Waller A (MRN [id number]) as of 06/04/2017 08:26  Ref. Range 06/17/2016 08:13 12/23/2016 08:59 05/19/2017 08:04  5-HIAA,Quant.,24 Hr Urine Latest Ref Range: 0.0 - 14.9 mg/24 hr 5.1 5.3 9.1  5-HIAA, Urine Latest Ref Range: Undefined mg/L 5.1 5.6 6.3     RADIOGRAPHIC STUDIES: PET Scan, 08/20/2017 IMPRESSION: PET scan on 08/20/17 with results of: Intense radiotracer activity associated with multiple hyperdense liver lesions consistent with well differentiated neuroendocrine tumor hepatic metastasis. Multiple small foci peritoneal metastasis within the lower abdomen and pelvis. Large mass associated the RIGHT adnexa and small lesions in LEFT adnexa also with intense radiotracer activity consistent with neuroendocrine tumor metastasis to the adnexa. No evidence of pulmonary metastasis or skeletal metastasis.  CT CAP W Contrast 05/19/17  IMPRESSION: 1. Minimal disease progression. Hypervascular liver lesions, some of which have enlarged minimally. Peritoneal metastasis, with mild progression of the dominant right adnexal mass compared to 06/17/16. Slight enlargement of a mesenteric node. 2. No new sites of disease identified. 3.  Aortic Atherosclerosis (ICD10-I70.0). 4. Hepatic steatosis. 5.  No  acute process or evidence of metastatic disease in the chest. 6. Right nephrolithiasis.  CT Chest, Abdomen w contrast 02/10/2017 IMPRESSION: 1. Multifocal arterial phase liver lesions are stable to slightly progressed in the interval. 2. Stable small peritoneal nodules.  CT CAP W CONTRAST 06/17/16 IMPRESSION: 1. Continued stability of multifocal metastatic disease involving the liver, peritoneum M and bilateral adnexum. No significant disease progression identified. 2. No significant thoracic findings 3. Aortic atherosclerosis 4. Hepatic steatosis.  CT chest, abdomen and pelvis 01/15/2016 IMPRESSION: 1. Continued stability of multifocal metastatic disease involving the liver, peritoneum and bilateral adnexa. No disease progression identified. 2. No significant thoracic findings. 3. Hepatic steatosis. 4.  Aortic Atherosclerosis (ICD10-170.0)   ASSESSMENT & PLAN:  62 y.o. female with  1. Metastatic carcinoid tumor of ileum to liver, peritoneum and adnexa -We previously reviewed the natural history of metastatic carcinoid tumor. She understands this is an incurable disease, and the goal of therapy is  disease control and prolong her life. -We also previously reviewed the overall treatment strategy for metastatic carcinoid tumor, she understands her disease will probably progress at some point, and we'll move on to second or third line treatment, such as underlying Everolimus or chemotherapy. -We previously  discussed the role of liver targeted therapy, and new treatment option 177 Lu-Dotatate in the future if needed  -She has been doing very well clinically, chromogranin A and urine 5 HIAA level are mostly normal. - CT Scan reviewed from 02/10/2017 shows slightly progression in liver. She is asymptomatic, lab results are stable, I recommend continue Sandostatin injection, with close monitoring. We will monitor closer with labs every 2 months and scans every 4 months instead. -We discussed the  CT CAP from 05/19/17 shows mild disease progression in her known lesions. No new sites of disease identified. I compared to her previous scan since 2013, I think that she has had overall disease progression, especially in the liver.   -PET scan on 08/20/17 with results of: Intense radiotracer activity associated with multiple hyperdense liver lesions consistent with well differentiated neuroendocrine tumor hepatic metastasis. Multiple small foci peritoneal metastasis within the lower abdomen and pelvis. Large mass associated the RIGHT adnexa and small lesions in LEFT adnexa also with intense radiotracer activity consistent with neuroendocrine tumor metastasis to the adnexa. No evidence of pulmonary metastasis or skeletal metastasis. -Discussed that due to her disease progression, I recommend changing her treatment.  We discussed the option of increase her Sandostatin injection dose to 60 mg every 4 weeks, Lutathera, or infinitor.  I discussed the benefit and risks with of each option with patient. I recommend Lutathera.  Patient seems to be interested, but would like to think about it.  -Will refer the patient to see Radiologist, Dr. Leonia Reeves if she decides to take Lutathera.  -She is doing well clinically, overall asymptomatic. She will proceed with sandostatin injection today. Will increase sandostatin dose today to 60 mg.  -Advised the patient to look out for darkened urine as it is typically indicative of increased bilirubin. Patient advised to call and/or come in to the office if this occurs.  -Injection every 4 weeks X3 -Lab in 4 and 12 weeks  -F/u in 12 weeks    2. Hypokalemia -Probably related to her diarrhea, K 3.7 on 06/10/16. -She is on potassium 1 tablet every other day, we'll continue. Her K has been normal lately   3. HTN -Her blood pressure was elevated. I previously encouraged her to monitor her blood pressure home. She states her BP has been normal at home and other office  -Follow-up  with primary care physician.  4. Cancer screening -She is overdue for mammogram, last mammogram in July 2015. -The patient forgot to schedule her breast mammogram. I previously encouraged her to reschedule. -She will continue follow up with her PCP and other specialists.   PLAN -Sandostatin today, increase dose to 60mg   -Injection every 4 weeks X3 -Lab in 4 and 12 weeks  -F/u in 12 weeks     All questions were answered. The patient knows to call the clinic with any problems, questions or concerns. No barriers to learning was detected.  I spent 20 minutes counseling the patient face to face. The total time spent in the appointment was 25 minutes and more than 50% was on counseling and review of test results    This document serves as a record of services personally performed by Truitt Merle, MD. It was created on  her behalf by Steva Colder, a trained medical scribe. The creation of this record is based on the scribe's personal observations and the provider's statements to them.   I have reviewed the above documentation for accuracy and completeness, and I agree with the above.   Truitt Merle 08/27/17

## 2017-08-27 ENCOUNTER — Telehealth: Payer: Self-pay

## 2017-08-27 ENCOUNTER — Encounter: Payer: Self-pay | Admitting: Hematology

## 2017-08-27 ENCOUNTER — Inpatient Hospital Stay: Payer: 59

## 2017-08-27 ENCOUNTER — Inpatient Hospital Stay: Payer: 59 | Attending: Hematology | Admitting: Hematology

## 2017-08-27 VITALS — BP 144/76 | HR 80 | Temp 98.1°F | Resp 20 | Ht 62.0 in | Wt 137.1 lb

## 2017-08-27 DIAGNOSIS — I700 Atherosclerosis of aorta: Secondary | ICD-10-CM | POA: Diagnosis not present

## 2017-08-27 DIAGNOSIS — Z8719 Personal history of other diseases of the digestive system: Secondary | ICD-10-CM

## 2017-08-27 DIAGNOSIS — C7B09 Secondary carcinoid tumors of other sites: Secondary | ICD-10-CM | POA: Insufficient documentation

## 2017-08-27 DIAGNOSIS — K760 Fatty (change of) liver, not elsewhere classified: Secondary | ICD-10-CM | POA: Diagnosis not present

## 2017-08-27 DIAGNOSIS — Z87442 Personal history of urinary calculi: Secondary | ICD-10-CM

## 2017-08-27 DIAGNOSIS — I10 Essential (primary) hypertension: Secondary | ICD-10-CM

## 2017-08-27 DIAGNOSIS — E876 Hypokalemia: Secondary | ICD-10-CM | POA: Diagnosis not present

## 2017-08-27 DIAGNOSIS — Z79899 Other long term (current) drug therapy: Secondary | ICD-10-CM | POA: Insufficient documentation

## 2017-08-27 DIAGNOSIS — D259 Leiomyoma of uterus, unspecified: Secondary | ICD-10-CM | POA: Diagnosis not present

## 2017-08-27 DIAGNOSIS — C7A012 Malignant carcinoid tumor of the ileum: Secondary | ICD-10-CM | POA: Insufficient documentation

## 2017-08-27 DIAGNOSIS — D3A012 Benign carcinoid tumor of the ileum: Secondary | ICD-10-CM

## 2017-08-27 MED ORDER — OCTREOTIDE ACETATE 30 MG IM KIT
PACK | INTRAMUSCULAR | Status: AC
  Filled 2017-08-27: qty 1

## 2017-08-27 MED ORDER — OCTREOTIDE ACETATE 30 MG IM KIT: 30.0000 mg | PACK | Freq: Once | INTRAMUSCULAR | Status: DC

## 2017-08-27 MED ORDER — OCTREOTIDE ACETATE 30 MG IM KIT
60.0000 mg | PACK | Freq: Once | INTRAMUSCULAR | Status: AC
Start: 2017-08-27 — End: 2017-08-27
  Administered 2017-08-27: 60 mg via INTRAMUSCULAR

## 2017-08-27 NOTE — Telephone Encounter (Signed)
Printed avs and calender of upcoming appointment. Per Patient requested to move her appointments back to [facility]. Per 2/13 los

## 2017-08-27 NOTE — Patient Instructions (Signed)

## 2017-09-03 ENCOUNTER — Ambulatory Visit: Payer: 59

## 2017-09-29 ENCOUNTER — Inpatient Hospital Stay: Payer: 59 | Attending: Hematology

## 2017-09-29 ENCOUNTER — Inpatient Hospital Stay: Payer: 59

## 2017-09-29 VITALS — BP 155/88 | HR 78 | Temp 98.1°F | Resp 18

## 2017-09-29 DIAGNOSIS — C7B09 Secondary carcinoid tumors of other sites: Secondary | ICD-10-CM

## 2017-09-29 DIAGNOSIS — Z79899 Other long term (current) drug therapy: Secondary | ICD-10-CM | POA: Insufficient documentation

## 2017-09-29 DIAGNOSIS — D3A012 Benign carcinoid tumor of the ileum: Secondary | ICD-10-CM

## 2017-09-29 LAB — COMPREHENSIVE METABOLIC PANEL
ALBUMIN: 3.8 g/dL (ref 3.5–5.0)
ALK PHOS: 65 U/L (ref 40–150)
ALT: 44 U/L (ref 0–55)
AST: 48 U/L — ABNORMAL HIGH (ref 5–34)
Anion gap: 7 (ref 3–11)
BUN: 11 mg/dL (ref 7–26)
CALCIUM: 9.1 mg/dL (ref 8.4–10.4)
CO2: 30 mmol/L — AB (ref 22–29)
CREATININE: 0.87 mg/dL (ref 0.60–1.10)
Chloride: 105 mmol/L (ref 98–109)
GFR calc non Af Amer: >60 mL/min (ref >60)
GLUCOSE: 149 mg/dL — AB (ref 70–140)
Potassium: 3.5 mmol/L (ref 3.5–5.1)
SODIUM: 142 mmol/L (ref 136–145)
Total Bilirubin: 0.6 mg/dL (ref 0.2–1.2)
Total Protein: 7.2 g/dL (ref 6.4–8.3)

## 2017-09-29 LAB — CBC WITH DIFFERENTIAL/PLATELET
BASOS PCT: 0 %
Basophils Absolute: 0 10*3/uL (ref 0.0–0.1)
EOS ABS: 0.1 10*3/uL (ref 0.0–0.5)
Eosinophils Relative: 1 %
HCT: 41.7 % (ref 34.8–46.6)
Hemoglobin: 13.5 g/dL (ref 11.6–15.9)
Lymphocytes Relative: 40 %
Lymphs Abs: 2.1 10*3/uL (ref 0.9–3.3)
MCH: 30.1 pg (ref 25.1–34.0)
MCHC: 32.4 g/dL (ref 31.5–36.0)
MCV: 92.9 fL (ref 79.5–101.0)
MONO ABS: 0.3 10*3/uL (ref 0.1–0.9)
MONOS PCT: 6 %
Neutro Abs: 2.8 10*3/uL (ref 1.5–6.5)
Neutrophils Relative %: 53 %
Platelets: 298 10*3/uL (ref 145–400)
RBC: 4.49 MIL/uL (ref 3.70–5.45)
RDW: 12.3 % (ref 11.2–14.5)
WBC: 5.3 10*3/uL (ref 3.9–10.3)

## 2017-09-29 MED ORDER — OCTREOTIDE ACETATE 30 MG IM KIT
60.0000 mg | PACK | Freq: Once | INTRAMUSCULAR | Status: AC
  Administered 2017-09-29: 60 mg via INTRAMUSCULAR

## 2017-09-29 NOTE — Patient Instructions (Signed)

## 2017-09-30 ENCOUNTER — Telehealth: Payer: Self-pay | Admitting: Hematology

## 2017-09-30 NOTE — Telephone Encounter (Signed)
Apopointments moved per 3/15 email msg from Dr Burr Medico. Calendar ./ Letter mailed to patient

## 2017-10-01 ENCOUNTER — Ambulatory Visit: Payer: 59

## 2017-10-01 LAB — CHROMOGRANIN A: Chromogranin A: 4 nmol/L (ref 0–5)

## 2017-10-02 ENCOUNTER — Telehealth: Payer: Self-pay

## 2017-10-02 NOTE — Telephone Encounter (Signed)
Per 3/21 phone message return calls. Patient asked for appointment to be scheduled for a later time but didn't specify which appointment. Asked patient to return call.

## 2017-10-27 ENCOUNTER — Other Ambulatory Visit: Payer: 59

## 2017-10-27 ENCOUNTER — Inpatient Hospital Stay: Payer: 59 | Attending: Hematology

## 2017-10-27 VITALS — BP 147/90 | HR 75 | Temp 98.2°F | Resp 20

## 2017-10-27 DIAGNOSIS — C7B09 Secondary carcinoid tumors of other sites: Secondary | ICD-10-CM | POA: Insufficient documentation

## 2017-10-27 DIAGNOSIS — Z79899 Other long term (current) drug therapy: Secondary | ICD-10-CM | POA: Insufficient documentation

## 2017-10-27 DIAGNOSIS — D3A012 Benign carcinoid tumor of the ileum: Secondary | ICD-10-CM

## 2017-10-27 MED ORDER — OCTREOTIDE ACETATE 30 MG IM KIT
PACK | INTRAMUSCULAR | Status: AC
  Filled 2017-10-27: qty 2

## 2017-10-27 MED ORDER — OCTREOTIDE ACETATE 30 MG IM KIT
60.0000 mg | PACK | Freq: Once | INTRAMUSCULAR | Status: AC
  Administered 2017-10-27: 60 mg via INTRAMUSCULAR

## 2017-10-27 NOTE — Patient Instructions (Signed)

## 2017-11-10 DIAGNOSIS — R319 Hematuria, unspecified: Secondary | ICD-10-CM | POA: Diagnosis not present

## 2017-11-10 DIAGNOSIS — Z86012 Personal history of benign carcinoid tumor: Secondary | ICD-10-CM | POA: Diagnosis not present

## 2017-11-10 DIAGNOSIS — Z87442 Personal history of urinary calculi: Secondary | ICD-10-CM | POA: Diagnosis not present

## 2017-11-14 ENCOUNTER — Other Ambulatory Visit: Payer: Self-pay

## 2017-11-14 ENCOUNTER — Emergency Department (HOSPITAL_COMMUNITY): Payer: 59

## 2017-11-14 ENCOUNTER — Encounter (HOSPITAL_COMMUNITY): Payer: Self-pay | Admitting: Emergency Medicine

## 2017-11-14 ENCOUNTER — Emergency Department (HOSPITAL_COMMUNITY)
Admission: EM | Admit: 2017-11-14 | Discharge: 2017-11-14 | Disposition: A | Discharge status: Home / Self Care | Payer: 59 | Attending: Emergency Medicine | Admitting: Emergency Medicine

## 2017-11-14 DIAGNOSIS — N202 Calculus of kidney with calculus of ureter: Secondary | ICD-10-CM | POA: Diagnosis not present

## 2017-11-14 DIAGNOSIS — R1031 Right lower quadrant pain: Secondary | ICD-10-CM | POA: Diagnosis present

## 2017-11-14 DIAGNOSIS — N200 Calculus of kidney: Secondary | ICD-10-CM

## 2017-11-14 DIAGNOSIS — N134 Hydroureter: Secondary | ICD-10-CM | POA: Insufficient documentation

## 2017-11-14 DIAGNOSIS — N132 Hydronephrosis with renal and ureteral calculous obstruction: Secondary | ICD-10-CM | POA: Diagnosis not present

## 2017-11-14 DIAGNOSIS — Z79899 Other long term (current) drug therapy: Secondary | ICD-10-CM | POA: Insufficient documentation

## 2017-11-14 DIAGNOSIS — C494 Malignant neoplasm of connective and soft tissue of abdomen: Secondary | ICD-10-CM | POA: Diagnosis not present

## 2017-11-14 DIAGNOSIS — N1330 Unspecified hydronephrosis: Secondary | ICD-10-CM | POA: Insufficient documentation

## 2017-11-14 DIAGNOSIS — R112 Nausea with vomiting, unspecified: Secondary | ICD-10-CM | POA: Insufficient documentation

## 2017-11-14 LAB — COMPREHENSIVE METABOLIC PANEL
ALBUMIN: 4.3 g/dL (ref 3.5–5.0)
ALT: 61 U/L — ABNORMAL HIGH (ref 14–54)
ANION GAP: 12 (ref 5–15)
AST: 105 U/L — ABNORMAL HIGH (ref 15–41)
Alkaline Phosphatase: 68 U/L (ref 38–126)
BILIRUBIN TOTAL: 0.7 mg/dL (ref 0.3–1.2)
BUN: 15 mg/dL (ref 6–20)
CO2: 24 mmol/L (ref 22–32)
Calcium: 9.3 mg/dL (ref 8.9–10.3)
Chloride: 106 mmol/L (ref 101–111)
Creatinine, Ser: 0.91 mg/dL (ref 0.44–1.00)
GFR calc Af Amer: >60 mL/min (ref >60)
GFR calc non Af Amer: >60 mL/min (ref >60)
GLUCOSE: 144 mg/dL — AB (ref 65–99)
Potassium: 3.4 mmol/L — ABNORMAL LOW (ref 3.5–5.1)
SODIUM: 142 mmol/L (ref 135–145)
TOTAL PROTEIN: 7.7 g/dL (ref 6.5–8.1)

## 2017-11-14 LAB — URINALYSIS, ROUTINE W REFLEX MICROSCOPIC
Bacteria, UA: NONE SEEN
Bilirubin Urine: NEGATIVE
Glucose, UA: NEGATIVE mg/dL
Ketones, ur: 5 mg/dL — AB
Nitrite: NEGATIVE
PH: 5 (ref 5.0–8.0)
Protein, ur: 30 mg/dL — AB
RBC / HPF: >50 RBC/hpf — ABNORMAL HIGH (ref 0–5)
SPECIFIC GRAVITY, URINE: 1.023 (ref 1.005–1.030)

## 2017-11-14 LAB — CBC
HEMATOCRIT: 41.6 % (ref 36.0–46.0)
Hemoglobin: 13.7 g/dL (ref 12.0–15.0)
MCH: 30.2 pg (ref 26.0–34.0)
MCHC: 32.9 g/dL (ref 30.0–36.0)
MCV: 91.6 fL (ref 78.0–100.0)
Platelets: 265 10*3/uL (ref 150–400)
RBC: 4.54 MIL/uL (ref 3.87–5.11)
RDW: 12.7 % (ref 11.5–15.5)
WBC: 11.5 10*3/uL — ABNORMAL HIGH (ref 4.0–10.5)

## 2017-11-14 LAB — LIPASE, BLOOD: Lipase: 44 U/L (ref 11–51)

## 2017-11-14 MED ORDER — ONDANSETRON 4 MG PO TBDP
4.0000 mg | ORAL_TABLET | Freq: Once | ORAL | Status: AC | PRN
  Administered 2017-11-14: 4 mg via ORAL
  Filled 2017-11-14: qty 1

## 2017-11-14 MED ORDER — ONDANSETRON HCL 4 MG/2ML IJ SOLN
4.0000 mg | Freq: Once | INTRAMUSCULAR | Status: AC
  Administered 2017-11-14: 4 mg via INTRAVENOUS
  Filled 2017-11-14: qty 2

## 2017-11-14 MED ORDER — OXYCODONE HCL 5 MG PO CAPS: 5.0000 mg | ORAL_CAPSULE | Freq: Four times a day (QID) | ORAL | 0 refills | Status: DC | PRN

## 2017-11-14 MED ORDER — MORPHINE SULFATE (PF) 4 MG/ML IV SOLN
INTRAVENOUS | Status: AC
  Filled 2017-11-14: qty 1

## 2017-11-14 MED ORDER — KETOROLAC TROMETHAMINE 30 MG/ML IJ SOLN
30.0000 mg | Freq: Once | INTRAMUSCULAR | Status: AC
  Administered 2017-11-14: 30 mg via INTRAVENOUS
  Filled 2017-11-14: qty 1

## 2017-11-14 MED ORDER — ONDANSETRON 4 MG PO TBDP: ORAL_TABLET | ORAL | 0 refills | Status: DC

## 2017-11-14 MED ORDER — MORPHINE SULFATE (PF) 4 MG/ML IV SOLN
4.0000 mg | Freq: Once | INTRAVENOUS | Status: AC
  Administered 2017-11-14: 4 mg via INTRAVENOUS
  Filled 2017-11-14: qty 1

## 2017-11-14 NOTE — ED Triage Notes (Addendum)
Patient started to have flank pain around 4:45pm and started having pain. Patient is in pain and is feeling nauseas. Patient had blood in urine when she went to the doctor on Tuesday. Patient is also complaining of right upper quadrant pain.

## 2017-11-14 NOTE — ED Notes (Signed)
Blue and gold top drawn in triage. 

## 2017-11-14 NOTE — ED Provider Notes (Signed)
[place] DEPT Provider Note   CSN: [id number] Arrival date & time: 11/14/17  1814     History   Chief Complaint Chief Complaint  Patient presents with  . Abdominal Pain  . Flank Pain    HPI Vanessa Waller is a 62 y.o. female.  Patient is a 62 year old female who presents with flank pain.  She has a prior history of a carcinoid tumor in her abdomen.  This was surgically removed as it had caused a small bowel obstruction.  This was several years ago.  She also has a prior history of kidney stones.  She states that 5 days ago she noticed some blood in her urine and a little bit of pain in her right lower abdomen.  She went to her PCP who prescribed her Flomax and tramadol.  She states that she is only taken 1 dose and has been feeling good all week.  [place] 5:00 PM and had sudden onset of pain in her right back radiating to her right mid abdomen.  This had associated nausea and vomiting.  She tried to take a dose of tramadol but threw it up.  She states the pain waxes and wanes in intensity.  She denies any fevers.  No dysuria.     Past Medical History:  Diagnosis Date  . 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 carcinoid tumor dx'd 08/2005  . Diverticulosis   . Hepatic steatosis 02/24/12  . Hypopotassemia   . 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 ([place])    liver,mesentery,pelvis  . Nephrolithiasis   . Nonspecific elevation of levels of transaminase or lactic acid dehydrogenase (LDH)   . Small bowel obstruction ([place])   . Uterine fibroid     Patient Active Problem List   Diagnosis Date Noted  . Hypersensitivity reaction 01/31/2014  . 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 ([place]) 11/09/2012  . Diarrhea 09/02/2012  . Hypokalemia 09/02/2012  . Hepatic steatosis 07/01/2012  . Left breast mass 07/01/2012  . 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 TRANSAMINASES, SERUM, ELEVATED  04/20/2009    Past Surgical History:  Procedure Laterality Date  . carcinoid tumor resection     ileum  . TUBAL LIGATION       OB History   None      Home Medications    Prior to Admission medications   Medication Sig Start Date End Date Taking? Authorizing Provider  Cholecalciferol (VITAMIN D-3) 5000 UNITS TABS Take 1 tablet by mouth daily.   Yes [provider]  loperamide (IMODIUM) 2 MG capsule Take by mouth as needed for diarrhea or loose stools.   Yes [provider]  Multiple Vitamin (MULTIVITAMIN) tablet Take 1 tablet by mouth daily.   Yes [provider]  octreotide (SANDOSTATIN LAR) 30 MG injection Inject 30 mg into the muscle every 28 (twenty-eight) days.   Yes [provider]  potassium chloride SA (K-DUR,KLOR-CON) 20 MEQ tablet Take 1 tablet (20 mEq total) by mouth every other day. 06/09/17  Yes Truitt Merle, MD  tamsulosin (FLOMAX) 0.4 MG CAPS capsule Take 0.4 mg by mouth daily. 11/10/17  Yes [provider]  traMADol (ULTRAM) 50 MG tablet Take 50 mg by mouth 3 (three) times daily. 11/10/17  Yes [provider]  ondansetron (ZOFRAN ODT) 4 MG disintegrating tablet 4mg  ODT q4 hours prn nausea/vomit 11/14/17   Malvin Johns, MD  oxycodone (OXY-IR) 5 MG capsule Take 1  capsule (5 mg total) by mouth every 6 (six) hours as needed. 11/14/17   Malvin Johns, MD    Family History Family History  Problem Relation Age of Onset  . Skin cancer Father   . Diabetes Mother   . Hypertension Mother   . Hyperlipidemia Mother   . Hypothyroidism Mother   . Hypothyroidism Sister   . Hyperlipidemia Sister   . Hypothyroidism Sister   . Breast cancer Paternal Grandmother     Social History Social History   Tobacco Use  . Smoking status: Never Smoker  . Smokeless tobacco: Never Used  Substance Use Topics  . Alcohol use: No    Comment: rare  . Drug use: No     Allergies   Cephalexin and Amoxicillin   Review of  Systems Review of Systems  Constitutional: Negative for chills, diaphoresis, fatigue and fever.  HENT: Negative for congestion, rhinorrhea and sneezing.   Eyes: Negative.   Respiratory: Negative for cough, chest tightness and shortness of breath.   Cardiovascular: Negative for chest pain and leg swelling.  Gastrointestinal: Positive for abdominal pain and nausea. Negative for blood in stool, diarrhea and vomiting.  Genitourinary: Positive for hematuria. Negative for difficulty urinating, flank pain and frequency.  Musculoskeletal: Positive for back pain. Negative for arthralgias.  Skin: Negative for rash.  Neurological: Negative for dizziness, speech difficulty, weakness, numbness and headaches.     Physical Exam Updated Vital Signs BP (!) 147/78   Pulse 85   Temp 97.7 F (36.5 C) (Oral)   Resp 18   Ht 5\' 1"  (1.549 m)   Wt 59.9 kg (132 lb)   SpO2 99%   BMI 24.94 kg/m   Physical Exam  Constitutional: She is oriented to person, place, and time. She appears well-developed and well-nourished. She appears distressed.  Appears uncomfortable  HENT:  Head: Normocephalic and atraumatic.  Eyes: Pupils are equal, round, and reactive to light.  Neck: Normal range of motion. Neck supple.  Cardiovascular: Normal rate, regular rhythm and normal heart sounds.  Pulmonary/Chest: Effort normal and breath sounds normal. No respiratory distress. She has no wheezes. She has no rales. She exhibits no tenderness.  Abdominal: Soft. Bowel sounds are normal. There is tenderness in the right upper quadrant and right lower quadrant. There is no rebound and no guarding.  Musculoskeletal: Normal range of motion. She exhibits no edema.  Lymphadenopathy:    She has no cervical adenopathy.  Neurological: She is alert and oriented to person, place, and time.  Skin: Skin is warm and dry. No rash noted.  Psychiatric: She has a normal mood and affect.     ED Treatments / Results  Labs (all labs ordered  are listed, but only abnormal results are displayed) Labs Reviewed  COMPREHENSIVE METABOLIC PANEL - Abnormal; Notable for the following components:      Result Value   Potassium 3.4 (*)    Glucose, Bld 144 (*)    AST 105 (*)    ALT 61 (*)    All other components within normal limits  CBC - Abnormal; Notable for the following components:   WBC 11.5 (*)    All other components within normal limits  URINALYSIS, ROUTINE W REFLEX MICROSCOPIC - Abnormal; Notable for the following components:   APPearance HAZY (*)    Hgb urine dipstick LARGE (*)    Ketones, ur 5 (*)    Protein, ur 30 (*)    Leukocytes, UA TRACE (*)    RBC / HPF >50 (*)  All other components within normal limits  LIPASE, BLOOD    EKG None  Radiology Ct Renal Stone Study  Result Date: 11/14/2017 CLINICAL DATA:  Flank pain on the right side EXAM: CT ABDOMEN AND PELVIS WITHOUT CONTRAST TECHNIQUE: Multidetector CT imaging of the abdomen and pelvis was performed following the standard protocol without IV contrast. COMPARISON:  PET-CT 08/20/2017, CT abdomen pelvis 05/19/2017, 06/17/2016 FINDINGS: Lower chest: Lung bases demonstrate no acute consolidation or pleural effusion. Normal heart size. Hepatobiliary: Hepatic metastatic foci are difficult to see without contrast. No calcified gallstones or biliary dilatation. Pancreas: Unremarkable. No pancreatic ductal dilatation or surrounding inflammatory changes. Spleen: Normal in size without focal abnormality. Adrenals/Urinary Tract: Adrenal glands are within normal limits. There are bilateral intrarenal calculi. On the left, this measures up to 5 mm in the upper pole. On the right, this measures up to 4 mm in the mid to lower pole. Mild to moderate right hydronephrosis and proximal hydroureter, secondary to a 5 x 7 mm stone in the proximal right ureter at the approximate L3-L4 level. The urinary bladder is unremarkable. Stomach/Bowel: Stomach is nonenlarged. No dilated small bowel. No  colon wall thickening. Postsurgical changes at the sigmoid colon. Vascular/Lymphatic: Mild aortic atherosclerosis. No aneurysmal dilatation. Reproductive: Large pelvic/adnexal mass as before, consistent with metastatic disease. Other: Negative for free air or free fluid. Multiple intraperitoneal nodules, no significant change. Musculoskeletal: No acute or suspicious abnormality. IMPRESSION: 1. Mild to moderate right hydronephrosis and hydroureter, secondary to a 5 x 7 mm stone in the proximal right ureter at approximate L3-L4 level. 2. Multiple bilateral intrarenal calculi 3. Hepatic metastatic foci are better seen on prior contrast-enhanced exams. Large pelvic and adnexal mass consistent with metastatic disease, grossly unchanged. Multiple metastatic peritoneal nodules, no significant change. Electronically Signed   By: Donavan Foil M.D.   On: 11/14/2017 22:12    Procedures Procedures (including critical care time)  Medications Ordered in ED Medications  ondansetron (ZOFRAN-ODT) disintegrating tablet 4 mg (4 mg Oral Given 11/14/17 1919)  ketorolac (TORADOL) 30 MG/ML injection 30 mg (30 mg Intravenous Given 11/14/17 2118)  morphine 4 MG/ML injection 4 mg (4 mg Intravenous Given 11/14/17 2122)  ondansetron (ZOFRAN) injection 4 mg (4 mg Intravenous Given 11/14/17 2118)     Initial Impression / Assessment and Plan / ED Course  I have reviewed the triage vital signs and the nursing notes.  Pertinent labs & imaging results that were available during my care of the patient were reviewed by me and considered in my medical decision making (see chart for details).     Patient is a 62 year old female who presents with sudden onset of right flank pain.  She does have associated hematuria but no signs of a urine urinary infection.  Her pain is controlled with medication in the ED.  CT scan shows a 5 x 7 mm proximal right ureteral stone with moderate hydronephrosis.  She has metastatic disease related to her  carcinoid tumor which is unchanged on CT scan.  Her liver enzymes are mildly elevated as compared to her recent values.  She does not have evidence of gallbladder disease on CT scan.  I feel this is likely from her carcinoid tumors.  Her pain is well controlled.  She is afebrile.  She has no ongoing vomiting.  She was discharged home in good condition.  She was encouraged to have follow-up with [facility] within the next week.  She was given strict return precautions.  She was advised that her  LFTs need to be monitored by her oncologist.  She was given a prescription for oxycodone and Zofran for symptomatically.  She already has a prescription for Flomax which I advised her to continue taking.  Final Clinical Impressions(s) / ED Diagnoses   Final diagnoses:  Kidney stone    ED Discharge Orders        Ordered    oxycodone (OXY-IR) 5 MG capsule  Every 6 hours PRN     11/14/17 2233    ondansetron (ZOFRAN ODT) 4 MG disintegrating tablet     11/14/17 2233       Malvin Johns, MD 11/14/17 2237

## 2017-11-14 NOTE — Discharge Instructions (Signed)
You need to make an appointment to follow-up with urology within the next week.  Return to the emergency room if you have any worsening pain, fevers, vomiting or difficulty urinating.  Your liver enzymes were mildly elevated as compared to your recent values.  This needs to be followed by your doctor.

## 2017-11-16 ENCOUNTER — Encounter: Payer: Self-pay | Admitting: Gastroenterology

## 2017-11-17 DIAGNOSIS — N202 Calculus of kidney with calculus of ureter: Secondary | ICD-10-CM | POA: Diagnosis not present

## 2017-11-24 ENCOUNTER — Ambulatory Visit: Payer: 59

## 2017-11-24 ENCOUNTER — Ambulatory Visit: Payer: 59 | Admitting: Hematology

## 2017-11-24 ENCOUNTER — Other Ambulatory Visit: Payer: 59

## 2017-12-01 ENCOUNTER — Inpatient Hospital Stay (HOSPITAL_BASED_OUTPATIENT_CLINIC_OR_DEPARTMENT_OTHER): Payer: 59 | Admitting: Hematology

## 2017-12-01 ENCOUNTER — Inpatient Hospital Stay: Payer: 59 | Attending: Hematology

## 2017-12-01 ENCOUNTER — Inpatient Hospital Stay: Payer: 59

## 2017-12-01 ENCOUNTER — Encounter: Payer: Self-pay | Admitting: Hematology

## 2017-12-01 ENCOUNTER — Telehealth: Payer: Self-pay

## 2017-12-01 VITALS — BP 143/79 | HR 86 | Temp 98.4°F | Resp 18 | Ht 61.0 in | Wt 131.2 lb

## 2017-12-01 DIAGNOSIS — E876 Hypokalemia: Secondary | ICD-10-CM | POA: Diagnosis not present

## 2017-12-01 DIAGNOSIS — Z79899 Other long term (current) drug therapy: Secondary | ICD-10-CM

## 2017-12-01 DIAGNOSIS — I10 Essential (primary) hypertension: Secondary | ICD-10-CM | POA: Insufficient documentation

## 2017-12-01 DIAGNOSIS — C7B02 Secondary carcinoid tumors of liver: Secondary | ICD-10-CM | POA: Diagnosis not present

## 2017-12-01 DIAGNOSIS — C7A012 Malignant carcinoid tumor of the ileum: Secondary | ICD-10-CM

## 2017-12-01 DIAGNOSIS — I700 Atherosclerosis of aorta: Secondary | ICD-10-CM

## 2017-12-01 DIAGNOSIS — N200 Calculus of kidney: Secondary | ICD-10-CM

## 2017-12-01 DIAGNOSIS — K760 Fatty (change of) liver, not elsewhere classified: Secondary | ICD-10-CM | POA: Insufficient documentation

## 2017-12-01 DIAGNOSIS — C7B09 Secondary carcinoid tumors of other sites: Secondary | ICD-10-CM

## 2017-12-01 DIAGNOSIS — N201 Calculus of ureter: Secondary | ICD-10-CM | POA: Diagnosis not present

## 2017-12-01 LAB — CBC WITH DIFFERENTIAL/PLATELET
BASOS ABS: 0.1 10*3/uL (ref 0.0–0.1)
BASOS PCT: 1 %
EOS PCT: 1 %
Eosinophils Absolute: 0.1 10*3/uL (ref 0.0–0.5)
HEMATOCRIT: 41.8 % (ref 34.8–46.6)
Hemoglobin: 13.5 g/dL (ref 11.6–15.9)
Lymphocytes Relative: 31 %
Lymphs Abs: 2 10*3/uL (ref 0.9–3.3)
MCH: 30.2 pg (ref 25.1–34.0)
MCHC: 32.3 g/dL (ref 31.5–36.0)
MCV: 93.5 fL (ref 79.5–101.0)
MONO ABS: 0.3 10*3/uL (ref 0.1–0.9)
MONOS PCT: 5 %
NEUTROS ABS: 4 10*3/uL (ref 1.5–6.5)
Neutrophils Relative %: 62 %
PLATELETS: 346 10*3/uL (ref 145–400)
RBC: 4.47 MIL/uL (ref 3.70–5.45)
RDW: 12.4 % (ref 11.2–14.5)
WBC: 6.5 10*3/uL (ref 3.9–10.3)

## 2017-12-01 LAB — COMPREHENSIVE METABOLIC PANEL
ALBUMIN: 3.9 g/dL (ref 3.5–5.0)
ALK PHOS: 75 U/L (ref 40–150)
ALT: 33 U/L (ref 0–55)
AST: 39 U/L — AB (ref 5–34)
Anion gap: 7 (ref 3–11)
BILIRUBIN TOTAL: 0.4 mg/dL (ref 0.2–1.2)
BUN: 13 mg/dL (ref 7–26)
CALCIUM: 9.3 mg/dL (ref 8.4–10.4)
CO2: 29 mmol/L (ref 22–29)
CREATININE: 0.92 mg/dL (ref 0.60–1.10)
Chloride: 104 mmol/L (ref 98–109)
GFR calc Af Amer: >60 mL/min (ref >60)
GFR calc non Af Amer: >60 mL/min (ref >60)
Glucose, Bld: 127 mg/dL (ref 70–140)
Potassium: 3.8 mmol/L (ref 3.5–5.1)
SODIUM: 140 mmol/L (ref 136–145)
TOTAL PROTEIN: 7.7 g/dL (ref 6.4–8.3)

## 2017-12-01 MED ORDER — OCTREOTIDE ACETATE 30 MG IM KIT: 60.0000 mg | PACK | Freq: Once | INTRAMUSCULAR | Status: DC

## 2017-12-01 NOTE — Progress Notes (Signed)
Vanessa Waller OFFICE PROGRESS NOTE  Patient Care Team: [name], Hershal Coria as PCP - General (Physician Assistant)   Date of Service:  12/01/2017   DIAGNOSIS: 1. 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  2. 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 The patient underwent surgical resection of her carcinoid tumor on 08/17/2005. Two out of 13 lymph nodes were positive. The patient's stage was III. The patient was noted to have an elevated urinary 5HIAA level in July 2011. A positive octreotide scan was noted coming from the right lower pelvis adjacent to the bladder on 09/06/2010. Initially this was felt to be a uterine fibroid. The patient has continued to have positive octreotide scan on 11/07/2011 and 05/06/2012. CT scans of the abdomen and pelvis were also carried out on 03/02/2010, 02/24/2012 and 06/30/2012. These scans in conjunction with an elevated 5HIAA level of 22.7 on 04/13/2012 and a chromogranin A level which had risen to 18.0 on 03/23/2012 strongly suggested that the patient's disease had recurred. The patient required admission to the hospital from 06/30/2012 through 07/03/2012 for an episode of upper abdominal pain which occurred suddenly the day prior to admission. The patient was started on somatostatin LAR 30 mg IM on 07/07/2012 for recurrent disease involving the lateral segment of the left hepatic lobe, a right jejunal mesenteric node and 2 enhancing masses adjacent to the uterus. Twenty-four-hour urine 5HIAA on 10/19/2012 was 5.5. CT scan of the abdomen and pelvis with IV contrast on 10/19/2012 showed generally stable disease with the exception of a decrease in the right abdominal mesenteric lymphadenopathy. There have been 2 lesions in the liver which are felt to be stable. One  lesion is in the lateral segment of the left hepatic lobe measuring 14 x 14 mm. The second lesion is seen in the lateral segment adjacent to the falciform ligament and measures 9 mm.  2. Uterine fibroids noted on imaging studies dating back to late April 2010.  3. Elevated liver transaminases noted in March 2007. The patient was evaluated by Dr. Delfin Edis in October 2010 and felt that the etiology was hepatic steatosis.  4. Admission to the hospital for upper abdominal pain with sudden onset. Admission dates were 06/30/2012 through 07/03/2012. CT scan of the abdomen and pelvis with IV contrast on 06/30/2012 showed no definite etiology for the pain. It was noted that a lymph node in the right jejunal mesentery had developed central necrosis when compared with the prior study of 02/24/2012. There was no evidence for bowel obstruction. There has previously been no evidence for gallstones on recent CT scans, and an abdominal ultrasound dating back to 05/08/2009.  5. Possible mass involving the left breast on screening mammogram from 06/18/2012. A diagnostic mammogram and ultrasound of the left breast carried out on 07/17/2012 were normal. Followup screening mammogram was suggested in 1 year. 6. Diarrhea with onset in December 2013, improved with treatment and use Imodium as needed.   CURRENT THERAPY:  Sandostatin LAR 30 mg IM every 4 weeks, started on 07/07/2012. Sandostatin LAR increased to 60 mg IM every 4 weeks on 08/27/2017 due to disease progression. Held from5/20/2019 per pt's request    INTERVAL HISTORY:   Vanessa Waller returns for follow up. Since her last visit she presented to the ED on 11/14/17 for Kidney Stones.  She  presents to the clinic today noting she saw the urologist today for Kidney US which showed her stone. Given size she is to let it pass. She had a kidney stone in the past. She notes she pays $5000 out of pocket a year for insurance deductible and the rest is covered.     On  review of symptoms, pt notes her BM are 1-2 times a day and does not need to use imodium. She notes her Diarrhea is related to what she eats.    REVIEW OF SYSTEMS:   Constitutional: Denies fevers, chills or abnormal weight loss  Eyes: Denies blurriness of vision Ears, nose, mouth, throat, and face: Denies mucositis or sore throat Respiratory: Denies cough, dyspnea or wheezes Cardiovascular: Denies palpitation, chest discomfort or lower extremity swelling Gastrointestinal:  Denies nausea, heartburn. (+) occasional diarrhea, stable   Skin: Denies abnormal skin rashes Lymphatics: Denies new lymphadenopathy or easy bruising Neurological:Denies numbness, tingling or new weaknesses Behavioral/Psych: Mood is stable, no new changes  All other systems were reviewed with the patient and are negative.  MEDICAL HISTORY:  Past Medical History:  Diagnosis Date  . 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 carcinoid tumor dx'd 08/2005  . Diverticulosis   . Hepatic steatosis 02/24/12  . Hypopotassemia   . 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 ([place])    liver,mesentery,pelvis  . Nephrolithiasis   . Nonspecific elevation of levels of transaminase or lactic acid dehydrogenase (LDH)   . Small bowel obstruction ([place])   . Uterine fibroid     SURGICAL HISTORY: Past Surgical History:  Procedure Laterality Date  . carcinoid tumor resection     ileum  . TUBAL LIGATION      I have reviewed the social history and family history with the patient and they are unchanged from previous note.  ALLERGIES:  is allergic to cephalexin and amoxicillin.  MEDICATIONS:  Current Outpatient Medications  Medication Sig Dispense Refill  . Ascorbic Acid (VITAMIN C) 100 MG tablet Take 100 mg by mouth daily.    . calcium acetate (PHOSLO) 667 MG capsule Take by mouth 3 (three) times daily with meals.    . Cholecalciferol (VITAMIN D-3) 5000 UNITS TABS Take 1 tablet by mouth daily.    [name] loperamide  (IMODIUM) 2 MG capsule Take by mouth as needed for diarrhea or loose stools.    . Multiple Vitamin (MULTIVITAMIN) tablet Take 1 tablet by mouth daily.    [name] octreotide (SANDOSTATIN LAR) 30 MG injection Inject 30 mg into the muscle every 28 (twenty-eight) days.    . ondansetron (ZOFRAN ODT) 4 MG disintegrating tablet 4mg  ODT q4 hours prn nausea/vomit 4 tablet 0  . oxycodone (OXY-IR) 5 MG capsule Take 1 capsule (5 mg total) by mouth every 6 (six) hours as needed. 15 capsule 0  . potassium chloride SA (K-DUR,KLOR-CON) 20 MEQ tablet Take 1 tablet (20 mEq total) by mouth every other day. 90 tablet 1  . tamsulosin (FLOMAX) 0.4 MG CAPS capsule Take 0.4 mg by mouth daily.  0  . traMADol (ULTRAM) 50 MG tablet Take 50 mg by mouth 3 (three) times daily.  0   No current facility-administered medications for this visit.     PHYSICAL EXAMINATION:  ECOG PERFORMANCE STATUS: 0 - Asymptomatic  Vitals:   12/01/17 1443  BP: (!) 143/79  Pulse: 86  Resp: 18  Temp: 98.4 F (36.9 C)  SpO2: 98%   Filed Weights   12/01/17 1443  Weight: 131 lb 3.2 oz (  59.5 kg)    GENERAL:alert, no distress and comfortable SKIN: skin color, texture, turgor are normal, no rashes or significant lesions EYES: normal, Conjunctiva are pink and non-injected, sclera clear OROPHARYNX:no exudate, no erythema and lips, buccal mucosa, and tongue normal  NECK: supple, thyroid normal size, non-tender, without nodularity LYMPH:  no palpable lymphadenopathy in the cervical, axillary or inguinal LUNGS: clear to auscultation and percussion with normal breathing effort HEART: regular rate & rhythm and no murmurs and no lower extremity edema ABDOMEN:abdomen soft, non-tender and normal bowel sounds. No hepatomegaly.  Musculoskeletal:no cyanosis of digits and no clubbing  NEURO: alert & oriented x 3 with fluent speech, no focal motor/sensory deficits  LABORATORY DATA:  I have reviewed the data as listed CBC Latest Ref Rng & Units 12/01/2017  11/14/2017 09/29/2017  WBC 3.9 - 10.3 K/uL 6.5 11.5(H) 5.3  Hemoglobin 11.6 - 15.9 g/dL 13.5 13.7 13.5  Hematocrit 34.8 - 46.6 % 41.8 41.6 41.7  Platelets 145 - 400 K/uL 346 265 298    CMP Latest Ref Rng & Units 12/01/2017 11/14/2017 09/29/2017  Glucose 70 - 140 mg/dL 127 144(H) 149(H)  BUN 7 - 26 mg/dL 13 15 11   Creatinine 0.60 - 1.10 mg/dL 0.92 0.91 0.87  Sodium 136 - 145 mmol/L 140 142 142  Potassium 3.5 - 5.1 mmol/L 3.8 3.4(L) 3.5  Chloride 98 - 109 mmol/L 104 106 105  CO2 22 - 29 mmol/L 29 24 30(H)  Calcium 8.4 - 10.4 mg/dL 9.3 9.3 9.1  Total Protein 6.4 - 8.3 g/dL 7.7 7.7 7.2  Total Bilirubin 0.2 - 1.2 mg/dL 0.4 0.7 0.6  Alkaline Phos 40 - 150 U/L 75 68 65  AST 5 - 34 U/L 39(H) 105(H) 48(H)  ALT 0 - 55 U/L 33 61(H) 44   Chromogranin A 08/05/16: 5 09/30/16: 5 11/25/16: 3 01/20/17: 5 04/14/17: 5 05/23/17: 4 09/29/17: 4 12/01/17: PENDING   Results for Vanessa Waller, Vanessa Waller A (MRN [id number]) as of 06/04/2017 08:26  Ref. Range 06/17/2016 08:13 12/23/2016 08:59 05/19/2017 08:04  5-HIAA,Quant.,24 Hr Urine Latest Ref Range: 0.0 - 14.9 mg/24 hr 5.1 5.3 9.1  5-HIAA, Urine Latest Ref Range: Undefined mg/L 5.1 5.6 6.3     RADIOGRAPHIC STUDIES: PET Scan, 08/20/2017 IMPRESSION: PET scan on 08/20/17 with results of: Intense radiotracer activity associated with multiple hyperdense liver lesions consistent with well differentiated neuroendocrine tumor hepatic metastasis. Multiple small foci peritoneal metastasis within the lower abdomen and pelvis. Large mass associated the RIGHT adnexa and small lesions in LEFT adnexa also with intense radiotracer activity consistent with neuroendocrine tumor metastasis to the adnexa. No evidence of pulmonary metastasis or skeletal metastasis.  CT CAP W Contrast 05/19/17  IMPRESSION: 1. Minimal disease progression. Hypervascular liver lesions, some of which have enlarged minimally. Peritoneal metastasis, with mild progression of the dominant right adnexal mass compared to  06/17/16. Slight enlargement of a mesenteric node. 2. No new sites of disease identified. 3.  Aortic Atherosclerosis (ICD10-I70.0). 4. Hepatic steatosis. 5.  No acute process or evidence of metastatic disease in the chest. 6. Right nephrolithiasis.  CT Chest, Abdomen w contrast 02/10/2017 IMPRESSION: 1. Multifocal arterial phase liver lesions are stable to slightly progressed in the interval. 2. Stable small peritoneal nodules.  CT CAP W CONTRAST 06/17/16 IMPRESSION: 1. Continued stability of multifocal metastatic disease involving the liver, peritoneum M and bilateral adnexum. No significant disease progression identified. 2. No significant thoracic findings 3. Aortic atherosclerosis 4. Hepatic steatosis.  CT chest, abdomen and pelvis 01/15/2016 IMPRESSION: 1. Continued stability  of multifocal metastatic disease involving the liver, peritoneum and bilateral adnexa. No disease progression identified. 2. No significant thoracic findings. 3. Hepatic steatosis. 4.  Aortic Atherosclerosis (ICD10-170.0)   ASSESSMENT & PLAN:  62 y.o. female with  1. Metastatic carcinoid tumor of ileum to liver, peritoneum and adnexa -We previously reviewed the natural history of metastatic carcinoid tumor. She understands this is an incurable disease, and the goal of therapy is disease control and prolong her life. -We also previously reviewed the overall treatment strategy for metastatic carcinoid tumor, she understands her disease will probably progress at some point, and we'll move on to second or third line treatment, such as underlying Everolimus or chemotherapy. -We previously  discussed the role of liver targeted therapy, and new treatment option 177 Lu-Dotatate in the future if needed  -She has been doing very well clinically, chromogranin A and urine 5 HIAA level are mostly normal. - CT Scan reviewed from 02/10/2017 shows slightly progression in liver. She is asymptomatic, lab results are stable, I  recommend continue Sandostatin injection, with close monitoring. We will monitor closer with labs every 2 months and scans every 4 months instead. -We discussed the CT CAP from 05/19/17 shows mild disease progression in her known lesions. No new sites of disease identified. I compared to her previous scan since 2013, I think that she has had overall disease progression, especially in the liver.   -PET scan on 08/20/17 with results of: Intense radiotracer activity associated with multiple hyperdense liver lesions consistent with well differentiated neuroendocrine tumor hepatic metastasis. Multiple small foci peritoneal metastasis within the lower abdomen and pelvis. Large mass associated the RIGHT adnexa and small lesions in LEFT adnexa also with intense radiotracer activity consistent with neuroendocrine tumor metastasis to the adnexa. No evidence of pulmonary metastasis or skeletal metastasis. -We again discussed that due to her disease progression, I recommend changing her treatment. We discussed the option of continuing Sandostatin injection dose at 60 mg every 4 weeks, Lutathera every 2 months for 4 treatments, or oral second-line Afinitor. I discussed the benefit and risks with of each option with patient. I recommend Lutathera. Patient would like to think about it more while she takes a break from [facility] treatment.  She had worsening diarrhea since I increased her Sandostatin dose.  She did not have significant carcinoid symptoms before the Sandostatin injection, patient feels she does not benefit much from Sandostatin injection anymore at this point, which I agree overall.  -She is fine to take a break for a few months, I did explain while off treatment for an extended time her disease may progress. -Will refer the patient to see Radiologist, Dr. Leonia Reeves if she decides to take Lutathera.  -She is doing well clinically, overall asymptomatic. However this disease is not curable at this point but still  very treatable to control disease.  -Labs reviewed, CBC and CMP are WNL, her transaminitis has near resolved -I advised her to monitor the color of her urine, significant weight loss or other concerning symptoms she should see [place] back sooner.  -F/u in 2-3 months     2. Hypokalemia -Probably related to her diarrhea, K 3.7 on 06/10/16. -She is on potassium 1 tablet every other day, we'll continue. -Her K has been normal lately, but still pending for today. Will adjust Oral K based on today's labs  3. HTN -Her blood pressure was elevated. I previously encouraged her to monitor her blood pressure home. She states her BP has been normal at  home and other office  -Follow-up with primary care physician.  4. Cancer screening -She is overdue for mammogram, last mammogram in July 2015. -The patient forgot to schedule her breast mammogram. I previously encouraged her to reschedule. -She will continue follow up with her PCP and other specialists.   PLAN -Lab and f/u in 2-3 months with lab 1 week before  -will hold Sandostatin Injection for now for treatment break  -she will think about Lutathera treatment   All questions were answered. The patient knows to call the clinic with any problems, questions or concerns. No barriers to learning was detected.  I spent 20 minutes counseling the patient face to face. The total time spent in the appointment was 25 minutes and more than 50% was on counseling and review of test results   This document serves as a record of services personally performed by Truitt Merle, MD. It was created on her behalf by Joslyn Devon, a trained medical scribe. The creation of this record is based on the scribe's personal observations and the provider's statements to them.   I have reviewed the above documentation for accuracy and completeness, and I agree with the above.   Truitt Merle 12/01/17

## 2017-12-01 NOTE — Telephone Encounter (Signed)
Appointments scheduled AVS/Calendar printed per 5/20 los °

## 2017-12-02 ENCOUNTER — Telehealth: Payer: Self-pay

## 2017-12-02 NOTE — Telephone Encounter (Signed)
-----   Message from Truitt Merle, MD sent at 12/02/2017 11:39 AM EDT ----- Please let pt know that her liver enzymes were back to normal yesterday except slightly elevated AST, no concerns, thanks.  Truitt Merle  12/02/2017

## 2017-12-02 NOTE — Telephone Encounter (Signed)
Notified patient that her lab results came back and her liver enzymes are back to normal except slightly elevated AST, per Dr. Burr Medico no concerns at presents.  Patient verbalized an understanding.

## 2017-12-03 LAB — CHROMOGRANIN A: Chromogranin A: 7 nmol/L — ABNORMAL HIGH (ref 0–5)

## 2017-12-09 ENCOUNTER — Telehealth: Payer: Self-pay | Admitting: *Deleted

## 2017-12-09 NOTE — Telephone Encounter (Signed)
Call received from Dariah A Townley in reference to "scheduling appointment with Navigator to discuss diagnosis and treatment."  Denied offer for scheduling provider visit.  Treatment planning out of Navigator's scope of practice.  "That's not what the web site says.  The web reads you can meet with navigator to discuss diagnosis and treatment plan.  Not looking for a new treatment plan.  I need a longer appointment time to discuss everything for a better understanding of what's going on with me and the treatment plan."   Call transfer attempted to GI Navigator unsuccessful.  Out of office through December 15, 2017 with instructions not to leave messages.  Agrees to wait until Navigators return to schedule with Navigator to help with this matter.

## 2017-12-10 ENCOUNTER — Telehealth: Payer: Self-pay | Admitting: *Deleted

## 2017-12-10 NOTE — Progress Notes (Signed)
  Oncology Nurse Navigator Documentation  Navigator Location: [facility] (12/10/17 1200)   )Navigator Encounter Type: Telephone (12/10/17 1200)                         Barriers/Navigation Needs: Education;Coordination of Care (12/10/17 1200)   Interventions: Other(F/U GI Navigator to sched visit with pt to address questions) (12/10/17 1200)  Patient called triage requesting appointment with GI Navigator.  I called patient and discussed her questions and concerns and let her know that the GI Navigator is currently out of the office and that we will call her next week and set up a visit to discuss her questions and concerns.  Patient was appreciative of the call and agreeable with the plan.                   Time Spent with Patient: 15 (12/10/17 1200)

## 2017-12-17 ENCOUNTER — Telehealth: Payer: Self-pay | Admitting: Hematology

## 2017-12-17 DIAGNOSIS — N202 Calculus of kidney with calculus of ureter: Secondary | ICD-10-CM | POA: Diagnosis not present

## 2017-12-17 NOTE — Telephone Encounter (Signed)
Appointment scheduled and patient notified per 6/5 sch msg

## 2017-12-17 NOTE — Progress Notes (Signed)
  Oncology Nurse Navigator Documentation  Navigator Location: [facility] (12/17/17 0841)   )Navigator Encounter Type: Telephone (12/17/17 0841) Telephone: Outgoing Call;Education (12/17/17 8979)    Called patient and left VM requesting a call back. Patient left message for me requesting information on treatment plan.                                    Acuity: Level 2 (12/17/17 0841)   Acuity Level 2: Other(Questions about Lutathera) (12/17/17 1504)     Time Spent with Patient: 15 (12/17/17 0841)

## 2017-12-17 NOTE — Progress Notes (Signed)
  Oncology Nurse Navigator Documentation  Navigator Location: [facility] (12/17/17 0942)   )Navigator Encounter Type: Telephone (12/17/17 2035) Telephone: Incoming Call (12/17/17 5974)  Patient called requesting to meet with someone to discuss treatment options and prognosis. Patient shared that she feels that she has not asked enough questions and feels confused. Patient would like a longer appointment to have time to ask questions about her disease process. Scheduling message sent.                      Barriers/Navigation Needs: Education (12/17/17 1638) Education: Understanding Cancer/ Treatment Options (12/17/17 4536) Interventions: Other (12/17/17 4680)        Support Groups/Services: (Apt w/ Dr. Burr Medico to have prognosis/tx option discusion made) (12/17/17 3212)   Acuity: Level 2 (12/17/17 0942)

## 2017-12-19 ENCOUNTER — Ambulatory Visit: Payer: 59 | Admitting: Hematology

## 2017-12-19 DIAGNOSIS — N201 Calculus of ureter: Secondary | ICD-10-CM | POA: Diagnosis not present

## 2017-12-30 ENCOUNTER — Ambulatory Visit: Payer: 59 | Admitting: Hematology

## 2018-01-02 DIAGNOSIS — N201 Calculus of ureter: Secondary | ICD-10-CM | POA: Diagnosis not present

## 2018-01-06 ENCOUNTER — Other Ambulatory Visit: Payer: Self-pay | Admitting: Urology

## 2018-01-12 ENCOUNTER — Encounter (HOSPITAL_BASED_OUTPATIENT_CLINIC_OR_DEPARTMENT_OTHER): Payer: Self-pay | Admitting: *Deleted

## 2018-01-13 ENCOUNTER — Encounter (HOSPITAL_BASED_OUTPATIENT_CLINIC_OR_DEPARTMENT_OTHER): Payer: Self-pay | Admitting: *Deleted

## 2018-01-13 ENCOUNTER — Other Ambulatory Visit: Payer: Self-pay

## 2018-01-13 NOTE — Progress Notes (Addendum)
SPOKE WITH Louis ARRIVE 530 AM [facility] DRIVER SISTER PATRICIA  ECHO 04-08-09 Epic/CHART NEEDS I STAT 4 HAS SURGERY ORDERS

## 2018-01-15 NOTE — Anesthesia Preprocedure Evaluation (Addendum)
Anesthesia Evaluation  Patient identified by MRN, date of birth, ID band Patient awake    Reviewed: Allergy & Precautions, NPO status , Patient's Chart, lab work & pertinent test results  History of Anesthesia Complications Negative for: history of anesthetic complications  Airway Mallampati: III  TM Distance: >3 FB Neck ROM: Full  Mouth opening: Limited Mouth Opening  Dental  (+) Dental Advisory Given   Pulmonary neg pulmonary ROS,    breath sounds clear to auscultation       Cardiovascular (-) hypertension(-) angina Rhythm:Regular Rate:Normal  '17 ECHO: EF 60-65%, valves OK   Neuro/Psych negative neurological ROS     GI/Hepatic H/o elevated LFTs H/o metastatic ileal carcinoid    Endo/Other  negative endocrine ROS  Renal/GU stones     Musculoskeletal   Abdominal   Peds  Hematology negative hematology ROS (+)   Anesthesia Other Findings   Reproductive/Obstetrics                            Anesthesia Physical Anesthesia Plan  ASA: II  Anesthesia Plan: General   Post-op Pain Management:    Induction: Intravenous  PONV Risk Score and Plan: 3 and Ondansetron, Dexamethasone and Scopolamine patch - Pre-op  Airway Management Planned: LMA  Additional Equipment:   Intra-op Plan:   Post-operative Plan:   Informed Consent: I have reviewed the patients History and Physical, chart, labs and discussed the procedure including the risks, benefits and alternatives for the proposed anesthesia with the patient or authorized representative who has indicated his/her understanding and acceptance.   Dental advisory given  Plan Discussed with: CRNA and Surgeon  Anesthesia Plan Comments: (Plan routine monitors, GA- LMA OK)        Anesthesia Quick Evaluation

## 2018-01-16 ENCOUNTER — Ambulatory Visit (HOSPITAL_BASED_OUTPATIENT_CLINIC_OR_DEPARTMENT_OTHER)
Admission: RE | Admit: 2018-01-16 | Discharge: 2018-01-16 | Disposition: A | Discharge status: Home / Self Care | Payer: 59 | Source: Ambulatory Visit | Attending: Urology | Admitting: Urology

## 2018-01-16 ENCOUNTER — Ambulatory Visit (HOSPITAL_BASED_OUTPATIENT_CLINIC_OR_DEPARTMENT_OTHER): Payer: 59 | Admitting: Anesthesiology

## 2018-01-16 ENCOUNTER — Encounter (HOSPITAL_BASED_OUTPATIENT_CLINIC_OR_DEPARTMENT_OTHER): Payer: Self-pay | Admitting: *Deleted

## 2018-01-16 ENCOUNTER — Other Ambulatory Visit: Payer: Self-pay

## 2018-01-16 ENCOUNTER — Encounter (HOSPITAL_BASED_OUTPATIENT_CLINIC_OR_DEPARTMENT_OTHER)
Admission: RE | Disposition: A | Discharge status: Home / Self Care | Payer: Self-pay | Source: Ambulatory Visit | Attending: Urology

## 2018-01-16 DIAGNOSIS — N202 Calculus of kidney with calculus of ureter: Secondary | ICD-10-CM | POA: Diagnosis not present

## 2018-01-16 DIAGNOSIS — Z880 Allergy status to penicillin: Secondary | ICD-10-CM | POA: Insufficient documentation

## 2018-01-16 DIAGNOSIS — Z881 Allergy status to other antibiotic agents status: Secondary | ICD-10-CM | POA: Diagnosis not present

## 2018-01-16 DIAGNOSIS — Z8589 Personal history of malignant neoplasm of other organs and systems: Secondary | ICD-10-CM | POA: Insufficient documentation

## 2018-01-16 DIAGNOSIS — E876 Hypokalemia: Secondary | ICD-10-CM | POA: Diagnosis not present

## 2018-01-16 DIAGNOSIS — Z79899 Other long term (current) drug therapy: Secondary | ICD-10-CM | POA: Diagnosis not present

## 2018-01-16 DIAGNOSIS — Z9049 Acquired absence of other specified parts of digestive tract: Secondary | ICD-10-CM | POA: Diagnosis not present

## 2018-01-16 DIAGNOSIS — N200 Calculus of kidney: Secondary | ICD-10-CM

## 2018-01-16 HISTORY — PX: CYSTOSCOPY/URETEROSCOPY/HOLMIUM LASER/STENT PLACEMENT: SHX6546

## 2018-01-16 HISTORY — DX: Personal history of urinary calculi: Z87.442

## 2018-01-16 LAB — POCT I-STAT, CHEM 8
BUN: 11 mg/dL (ref 8–23)
Calcium, Ion: 1.26 mmol/L (ref 1.15–1.40)
Chloride: 100 mmol/L (ref 98–111)
Creatinine, Ser: 0.9 mg/dL (ref 0.44–1.00)
Glucose, Bld: 93 mg/dL (ref 70–99)
HEMATOCRIT: 44 % (ref 36.0–46.0)
HEMOGLOBIN: 15 g/dL (ref 12.0–15.0)
POTASSIUM: 4.1 mmol/L (ref 3.5–5.1)
Sodium: 143 mmol/L (ref 135–145)
TCO2: 28 mmol/L (ref 22–32)

## 2018-01-16 SURGERY — CYSTOSCOPY/URETEROSCOPY/HOLMIUM LASER/STENT PLACEMENT
Anesthesia: General | Laterality: Right

## 2018-01-16 MED ORDER — FENTANYL CITRATE (PF) 100 MCG/2ML IJ SOLN
25.0000 ug | INTRAMUSCULAR | Status: DC | PRN
  Filled 2018-01-16: qty 1

## 2018-01-16 MED ORDER — FENTANYL CITRATE (PF) 100 MCG/2ML IJ SOLN
INTRAMUSCULAR | Status: AC
  Filled 2018-01-16: qty 2

## 2018-01-16 MED ORDER — KETOROLAC TROMETHAMINE 30 MG/ML IJ SOLN
INTRAMUSCULAR | Status: DC | PRN
  Administered 2018-01-16: 30 mg via INTRAVENOUS

## 2018-01-16 MED ORDER — CIPROFLOXACIN IN D5W 400 MG/200ML IV SOLN
INTRAVENOUS | Status: AC
  Filled 2018-01-16: qty 200

## 2018-01-16 MED ORDER — LIDOCAINE 2% (20 MG/ML) 5 ML SYRINGE
INTRAMUSCULAR | Status: DC | PRN
  Administered 2018-01-16: 30 mg via INTRAVENOUS

## 2018-01-16 MED ORDER — MIDAZOLAM HCL 5 MG/5ML IJ SOLN
INTRAMUSCULAR | Status: DC | PRN
  Administered 2018-01-16: 2 mg via INTRAVENOUS

## 2018-01-16 MED ORDER — SCOPOLAMINE 1 MG/3DAYS TD PT72
MEDICATED_PATCH | TRANSDERMAL | Status: AC
  Filled 2018-01-16: qty 1

## 2018-01-16 MED ORDER — PHENAZOPYRIDINE HCL 100 MG PO TABS
ORAL_TABLET | ORAL | Status: AC
  Filled 2018-01-16: qty 2

## 2018-01-16 MED ORDER — DEXAMETHASONE SODIUM PHOSPHATE 10 MG/ML IJ SOLN
INTRAMUSCULAR | Status: DC | PRN
  Administered 2018-01-16: 10 mg via INTRAVENOUS

## 2018-01-16 MED ORDER — PHENAZOPYRIDINE HCL 200 MG PO TABS
200.0000 mg | ORAL_TABLET | Freq: Three times a day (TID) | ORAL | Status: DC
  Administered 2018-01-16: 200 mg via ORAL
  Filled 2018-01-16: qty 1

## 2018-01-16 MED ORDER — CIPROFLOXACIN HCL 500 MG PO TABS: 500.0000 mg | ORAL_TABLET | Freq: Once | ORAL | 0 refills | Status: AC

## 2018-01-16 MED ORDER — ONDANSETRON HCL 4 MG/2ML IJ SOLN
INTRAMUSCULAR | Status: DC | PRN
  Administered 2018-01-16: 4 mg via INTRAVENOUS

## 2018-01-16 MED ORDER — TRAMADOL HCL 50 MG PO TABS: 50.0000 mg | ORAL_TABLET | Freq: Four times a day (QID) | ORAL | 0 refills | Status: DC | PRN

## 2018-01-16 MED ORDER — BELLADONNA ALKALOIDS-OPIUM 16.2-60 MG RE SUPP
RECTAL | Status: DC | PRN
  Administered 2018-01-16: 1 via RECTAL

## 2018-01-16 MED ORDER — PHENAZOPYRIDINE HCL 200 MG PO TABS: 200.0000 mg | ORAL_TABLET | Freq: Three times a day (TID) | ORAL | 0 refills | Status: DC | PRN

## 2018-01-16 MED ORDER — DEXAMETHASONE SODIUM PHOSPHATE 10 MG/ML IJ SOLN
INTRAMUSCULAR | Status: AC
  Filled 2018-01-16: qty 1

## 2018-01-16 MED ORDER — TRAMADOL HCL 50 MG PO TABS
50.0000 mg | ORAL_TABLET | Freq: Four times a day (QID) | ORAL | Status: DC | PRN
  Administered 2018-01-16: 50 mg via ORAL
  Filled 2018-01-16: qty 1

## 2018-01-16 MED ORDER — CIPROFLOXACIN IN D5W 400 MG/200ML IV SOLN
400.0000 mg | INTRAVENOUS | Status: AC
  Administered 2018-01-16: 400 mg via INTRAVENOUS
  Filled 2018-01-16: qty 200

## 2018-01-16 MED ORDER — PROPOFOL 10 MG/ML IV BOLUS
INTRAVENOUS | Status: AC
  Filled 2018-01-16: qty 40

## 2018-01-16 MED ORDER — MEPERIDINE HCL 25 MG/ML IJ SOLN
6.2500 mg | INTRAMUSCULAR | Status: DC | PRN
  Filled 2018-01-16: qty 1

## 2018-01-16 MED ORDER — IOHEXOL 300 MG/ML  SOLN
INTRAMUSCULAR | Status: DC | PRN
  Administered 2018-01-16: 8 mL

## 2018-01-16 MED ORDER — LACTATED RINGERS IV SOLN
INTRAVENOUS | Status: DC
  Administered 2018-01-16 (×2): via INTRAVENOUS
  Filled 2018-01-16: qty 1000

## 2018-01-16 MED ORDER — MIDAZOLAM HCL 2 MG/2ML IJ SOLN
INTRAMUSCULAR | Status: AC
  Filled 2018-01-16: qty 2

## 2018-01-16 MED ORDER — FENTANYL CITRATE (PF) 100 MCG/2ML IJ SOLN
INTRAMUSCULAR | Status: DC | PRN
  Administered 2018-01-16 (×2): 25 ug via INTRAVENOUS

## 2018-01-16 MED ORDER — ONDANSETRON HCL 4 MG/2ML IJ SOLN
INTRAMUSCULAR | Status: AC
  Filled 2018-01-16: qty 2

## 2018-01-16 MED ORDER — PROMETHAZINE HCL 25 MG/ML IJ SOLN
6.2500 mg | INTRAMUSCULAR | Status: DC | PRN
  Filled 2018-01-16: qty 1

## 2018-01-16 MED ORDER — SCOPOLAMINE 1 MG/3DAYS TD PT72
1.0000 | MEDICATED_PATCH | Freq: Once | TRANSDERMAL | Status: DC
  Administered 2018-01-16: 1.5 mg via TRANSDERMAL
  Filled 2018-01-16: qty 1

## 2018-01-16 MED ORDER — BELLADONNA ALKALOIDS-OPIUM 16.2-60 MG RE SUPP
RECTAL | Status: AC
  Filled 2018-01-16: qty 1

## 2018-01-16 MED ORDER — KETOROLAC TROMETHAMINE 30 MG/ML IJ SOLN
INTRAMUSCULAR | Status: AC
  Filled 2018-01-16: qty 1

## 2018-01-16 MED ORDER — PROPOFOL 10 MG/ML IV BOLUS
INTRAVENOUS | Status: DC | PRN
  Administered 2018-01-16: 130 mg via INTRAVENOUS
  Administered 2018-01-16: 70 mg via INTRAVENOUS

## 2018-01-16 MED ORDER — MIDAZOLAM HCL 2 MG/2ML IJ SOLN
0.5000 mg | Freq: Once | INTRAMUSCULAR | Status: DC | PRN
  Filled 2018-01-16: qty 2

## 2018-01-16 MED ORDER — LIDOCAINE 2% (20 MG/ML) 5 ML SYRINGE
INTRAMUSCULAR | Status: AC
  Filled 2018-01-16: qty 5

## 2018-01-16 MED ORDER — TRAMADOL HCL 50 MG PO TABS
ORAL_TABLET | ORAL | Status: AC
  Filled 2018-01-16: qty 1

## 2018-01-16 SURGICAL SUPPLY — 29 items
BAG DRAIN URO-CYSTO SKYTR STRL (DRAIN) ×2 IMPLANT
BASKET LASER NITINOL 1.9FR (BASKET) IMPLANT
BASKET STNLS GEMINI 4WIRE 3FR (BASKET) IMPLANT
BASKET STONE 1.7 NGAGE (UROLOGICAL SUPPLIES) ×2 IMPLANT
BASKET ZERO TIP NITINOL 2.4FR (BASKET) IMPLANT
CATH URET 5FR 28IN OPEN ENDED (CATHETERS) ×2 IMPLANT
CATH URET DUAL LUMEN 6-10FR 50 (CATHETERS) ×2 IMPLANT
CLOTH BEACON ORANGE TIMEOUT ST (SAFETY) ×2 IMPLANT
EXTRACTOR STONE 1.7FRX115CM (UROLOGICAL SUPPLIES) IMPLANT
FIBER LASER TRAC TIP (UROLOGICAL SUPPLIES) ×2 IMPLANT
GLOVE BIO SURGEON STRL SZ7.5 (GLOVE) ×2 IMPLANT
GOWN STRL REUS W/TWL XL LVL3 (GOWN DISPOSABLE) ×2 IMPLANT
GUIDEWIRE 0.038 PTFE COATED (WIRE) IMPLANT
GUIDEWIRE ANG ZIPWIRE 038X150 (WIRE) IMPLANT
GUIDEWIRE STR DUAL SENSOR (WIRE) ×4 IMPLANT
INFUSOR MANOMETER BAG 3000ML (MISCELLANEOUS) ×2 IMPLANT
IV NS IRRIG 3000ML ARTHROMATIC (IV SOLUTION) ×2 IMPLANT
KIT BALLIN UROMAX 15FX10 (LABEL) IMPLANT
KIT BALLN UROMAX 15FX4 (MISCELLANEOUS) IMPLANT
KIT BALLN UROMAX 26 75X4 (MISCELLANEOUS)
KIT TURNOVER CYSTO (KITS) ×2 IMPLANT
MANIFOLD NEPTUNE II (INSTRUMENTS) ×2 IMPLANT
NS IRRIG 500ML POUR BTL (IV SOLUTION) ×2 IMPLANT
PACK CYSTO (CUSTOM PROCEDURE TRAY) ×2 IMPLANT
SET HIGH PRES BAL DIL (LABEL)
SHEATH ACCESS URETERAL 38CM (SHEATH) IMPLANT
STENT URET 6FRX24 CONTOUR (STENTS) ×2 IMPLANT
TUBE CONNECTING 12X1/4 (SUCTIONS) ×2 IMPLANT
TUBING UROLOGY SET (TUBING) ×2 IMPLANT

## 2018-01-16 NOTE — H&P (Signed)
/u for obstructing stone  HPI: Vanessa Waller is a 62 year-old female established patient who is here for further eval and management of an obstructing stone.  The patient was last seen June 4.   The patient's stone is on her right side. The stone was right distal ureter. There are additional stones within the urinary tract. They are located bilateral nonobstructing stones.   The patient has not passed their stone since her visit. The patient is complaining of flank pain and groin pain. The patient underwent No imaging prior to today's appointment. The patient has been taking oxycodone for their obstructing stone.   The patient presents today after being seen 2-1/2 weeks prior. She does not think that she's passed her stone. 3 days prior she did have an intense episode of pain that was able to be managed with oxycodone. She denies any fevers or chills. She's not had any progressive urinary tract symptoms. She has not had any hematuria.     ALLERGIES: Amoxicillin CAPS Keflex CAPS    MEDICATIONS: Flomax 0.4 mg capsule 1 capsule PO Q HS  Tamsulosin Hcl 0.4 mg capsule  Calcium  Imodium A-D  Multiple Vitamins  Oxycodone Hcl  Potassium  Sandostatin  Vitamin C  Vitamin D3     GU PSH: None     PSH Notes: Partial Colectomy, Tubal Ligation, carcinoid cancer    NON-GU PSH: Partial colectomy - 2015 Tubal Ligation - 2015    GU PMH: Renal calculus - 12/17/2017, - 12/01/2017, - 11/17/2017 Ureteral calculus - 12/17/2017, - 12/01/2017, - 11/17/2017, Calculus of left ureter, - 2015 Other microscopic hematuria, Microscopic hematuria - 2015 History of urolithiasis, History of renal calculi - 2015    NON-GU PMH: Encounter for general adult medical examination without abnormal findings, Encounter for preventive health examination - 2015 Benign carcinoid tumor of unspecified site, Carcinoid tumor - 2015    FAMILY HISTORY: Blood in the urine - Runs in Family Death of parent - Father, Mother Dementia -  Mother Kidney Cancer - Runs In Family Kidney Stones - Runs In Family Renal Cell Carcinoma - Runs In Family Thyroid - Runs in Family   SOCIAL HISTORY: Marital Status: Single Preferred Language: English; Ethnicity: Not Hispanic Or Latino; Race: White Current Smoking Status: Patient has never smoked.   Tobacco Use Assessment Completed: Used Tobacco in last 30 days? Has never drank.  Drinks 3 caffeinated drinks per day. Patient's occupation Nurse, adult.     Notes: No alcohol use, Never a smoker, Single, Occupation, Caffeine use   REVIEW OF SYSTEMS:    GU Review Female:   Patient denies frequent urination, hard to postpone urination, burning /pain with urination, get up at night to urinate, leakage of urine, stream starts and stops, trouble starting your stream, have to strain to urinate, and being pregnant.  Gastrointestinal (Upper):   Patient denies nausea, vomiting, and indigestion/ heartburn.  Gastrointestinal (Lower):   Patient reports diarrhea. Patient denies constipation.  Constitutional:   Patient denies fever, night sweats, weight loss, and fatigue.  Skin:   Patient denies skin rash/ lesion and itching.  Eyes:   Patient denies blurred vision and double vision.  Ears/ Nose/ Throat:   Patient denies sore throat and sinus problems.  Hematologic/Lymphatic:   Patient denies swollen glands and easy bruising.  Cardiovascular:   Patient denies leg swelling and chest pains.  Respiratory:   Patient denies cough and shortness of breath.  Endocrine:   Patient denies excessive thirst.  Musculoskeletal:  Patient denies back pain and joint pain.  Neurological:   Patient denies headaches and dizziness.  Psychologic:   Patient denies depression and anxiety.   VITAL SIGNS:      01/02/2018 09:48 AM  Weight 132 lb / 59.87 kg  Height 61 in / 154.94 cm  BP 142/82 mmHg  Pulse 82 /min  Temperature 97.8 F / 36.5 C  BMI 24.9 kg/m   MULTI-SYSTEM PHYSICAL EXAMINATION:    Constitutional:  Well-nourished. No physical deformities. Normally developed. Good grooming.  Respiratory: No labored breathing, no use of accessory muscles. Normal breath sounds.   Cardiovascular: Regular rate and rhythm. No murmur, no gallop. Normal temperature, normal extremity pulses, no swelling, no varicosities.     PAST DATA REVIEWED:  Source Of History:  Patient  Records Review:   Previous Patient Records  X-Ray Review: [facility]: Reviewed Films. Discussed With Patient.  C.T. Abdomen/Pelvis: Reviewed Films. Discussed With Patient.     PROCEDURES:         KUB - [id number]  A single view of the abdomen is obtained. Renal shadows are easily visualized bilaterally. There are 3 visible stones, small, and the right renal pelvis. There are no visible stones in the left renal pelvis There is a 5 mm stone in the right distal ureter/UPJ Gas pattern is grossly normal. No significant bony abnormalities.      Impression: The patient appears not to have passed her stone, located in the right distal ureter. She has Nonobstructing stones in the upper pole of the right kidney.         Urinalysis w/Scope Dipstick Dipstick Cont'd Micro  Color: Yellow Bilirubin: Neg WBC/hpf: NS (Not Seen)  Appearance: Clear Ketones: Neg RBC/hpf: 3 - 10/hpf  Specific Gravity: 1.025 Blood: 1+ Bacteria: NS (Not Seen)  pH: 5.5 Protein: Neg Cystals: NS (Not Seen)  Glucose: Neg Urobilinogen: 0.2 Casts: NS (Not Seen)    Nitrites: Neg Trichomonas: Not Present    Leukocyte Esterase: Neg Mucous: Not Present      Epithelial Cells: 0 - 5/hpf      Yeast: NS (Not Seen)      Sperm: Not Present    ASSESSMENT:      ICD-10 Details  1 GU:   Ureteral calculus - N20.1    PLAN:           Orders X-Rays: KUB          Schedule Return Visit/Planned Activity: ASAP - Schedule Surgery          Document Letter(s):  Created for Patient: Clinical Summary         Notes:   The patient has not pass her stone, this is going on 6 weeks without  resolution. Fortunately she's had very few episodes of pain. Currently she is asymptomatic. We discussed treatment options of her distal ureteral stone in the nonobstructing stones in the right kidney. I recommended she consider ureteroscopy which would allow [place] to treat all her stones at once. I went through the proposed procedure with the patient in detail including the risks and the benefits associated with it. She understands that following the operation she'll have a stent. We discussed stent discomfort as well as postoperative discomfort. We discussed the risks of infection. Target the schedule for the patient ASAP.   The patient does have a history of carcinoid, her symptoms are well controlled. She's had no trouble with anesthesia since she was diagnosed with this.

## 2018-01-16 NOTE — Anesthesia Postprocedure Evaluation (Signed)
Anesthesia Post Note  Patient: Latash A Giacomo  Procedure(s) Performed: RIGHT URETEROSCOPY/RIGHT RETROGRADE/HOLMIUM LASER/STENT PLACEMENT (Right )     Patient location during evaluation: PACU Anesthesia Type: General Level of consciousness: awake and alert, oriented and patient cooperative Pain management: pain level controlled Vital Signs Assessment: post-procedure vital signs reviewed and stable Respiratory status: spontaneous breathing, nonlabored ventilation and respiratory function stable Cardiovascular status: blood pressure returned to baseline and stable Postop Assessment: no apparent nausea or vomiting and able to ambulate Anesthetic complications: no    Last Vitals:  Vitals:   01/16/18 0945 01/16/18 1000  BP: (!) 159/97 (!) 174/103  Pulse: 79 83  Resp: 12 13  Temp:    SpO2: 100% 100%    Last Pain:  Vitals:   01/16/18 1000  TempSrc:   PainSc: 3                  Dahlton Hinde,E. Carlo Guevarra

## 2018-01-16 NOTE — Anesthesia Procedure Notes (Signed)
Procedure Name: LMA Insertion Date/Time: 01/16/2018 7:36 AM Performed by: Bonney Aid, CRNA Pre-anesthesia Checklist: Patient identified, Emergency Drugs available, Suction available and Patient being monitored Patient Re-evaluated:Patient Re-evaluated prior to induction Oxygen Delivery Method: Circle system utilized Preoxygenation: Pre-oxygenation with 100% oxygen Induction Type: IV induction Ventilation: Mask ventilation without difficulty LMA: LMA inserted LMA Size: 3.0 Number of attempts: 2 Airway Equipment and Method: Bite block Placement Confirmation: positive ETCO2 Tube secured with: Tape Dental Injury: Teeth and Oropharynx as per pre-operative assessment

## 2018-01-16 NOTE — Op Note (Signed)
Preoperative diagnosis: right ureteral calculus and right renal calculus  Postoperative diagnosis: same  Procedure:  1. Cystoscopy 2. right ureteroscopy and stone removal 3. Ureteroscopic laser lithotripsy 4. right 65F x 24cm ureteral stent placement  5. right retrograde pyelography with interpretation  Surgeon: Ardis Hughs, MD  Anesthesia: General  Complications: None  Intraoperative findings:  #1) right retrograde pyelography demonstrated a filling defect within the right ureter consistent with the patient's known calculus without other abnormalities. #2: The patient had a obstructed/impacted stone in the right distal ureter which was fragmented and removed.  She had 3 or 4 nonobstructing stones in the mid and lower pole calyces of the right kidney.  I moved all these up into the upper pole and dusted these creating numerous small fragments that are small enough to pass.  EBL: Minimal  Specimens: 1. right ureteral calculus  Disposition of specimens: [facility] for stone analysis  Indication: Vanessa Waller is a 62 y.o.   patient with a right ureteral stone and associated right symptoms.  The patient also had nonobstructing stones in the right side.  After reviewing the management options for treatment, the patient elected to proceed with the above surgical procedure(s). We have discussed the potential benefits and risks of the procedure, side effects of the proposed treatment, the likelihood of the patient achieving the goals of the procedure, and any potential problems that might occur during the procedure or recuperation. Informed consent has been obtained.   Description of procedure:  The patient was taken to the operating room and general anesthesia was induced.  The patient was placed in the dorsal lithotomy position, prepped and draped in the usual sterile fashion, and preoperative antibiotics were administered. A preoperative time-out was  performed.   Cystourethroscopy was performed.  The patient's urethra was examined and was normal. The bladder was then systematically examined in its entirety. There was no evidence for any bladder tumors, stones, or other mucosal pathology.    Attention then turned to the right ureteral orifice and a ureteral catheter was used to intubate the ureteral orifice.  Omnipaque contrast was injected through the ureteral catheter and a retrograde pyelogram was performed with findings as dictated above.  A 0.38 sensor guidewire was then advanced up the right ureter into the renal pelvis under fluoroscopic guidance. The 6 Fr semirigid ureteroscope was then advanced into the ureter next to the guidewire and the calculus was identified.  The stone was then fragmented with the 200 micron holmium laser fiber on a setting of  10 and frequency of 6 Hz.   All stones were then removed from the ureter with an N-gage nitinol basket.  Reinspection of the ureter revealed no remaining visible stones or fragments.   I then advanced a second wire into the right renal pelvis and passed a single lumen digital ureteroscope over the wire and into the right renal pelvis.  Fluoroscopy was performed in I encountered 3 loose nonobstructing stones that I subsequently moved into the upper pole with the engage basket.  I then used the laser fiber and dusted these stones into very small fragments that were small enough to pass.  I subsequently removed the scope under visual guidance having no significant ureteral trauma.  The wire was then backloaded through the cystoscope and a ureteral stent was advance over the wire using Seldinger technique.  The stent was positioned appropriately under fluoroscopic and cystoscopic guidance.  The wire was then removed with an adequate stent curl noted  in the renal pelvis as well as in the bladder.  The bladder was then emptied and the procedure ended.  The patient appeared to tolerate the procedure  well and without complications.  The patient was able to be awakened and transferred to the recovery unit in satisfactory condition.   Disposition: The tether of the stent was left tucked inside the patient's vagina.  Instructions for removing the stent have been provided to the patient. The patient has been scheduled for followup in 6 weeks with a renal ultrasound.

## 2018-01-16 NOTE — Transfer of Care (Signed)
Immediate Anesthesia Transfer of Care Note  Patient: Vanessa Waller  Procedure(s) Performed: RIGHT URETEROSCOPY/RIGHT RETROGRADE/HOLMIUM LASER/STENT PLACEMENT (Right )  Patient Location: PACU  Anesthesia Type:General  Level of Consciousness: sedated  Airway & Oxygen Therapy: Patient Spontanous Breathing and Patient connected to nasal cannula oxygen, obstructing, Nasal trumpet 6 placed   Post-op Assessment: Report given to RN and Post -op Vital signs reviewed and stable  Post vital signs: Reviewed and stable  Last Vitals:  Vitals Value Taken Time  BP 157/90 01/16/2018  8:58 AM  Temp 36.9 C 01/16/2018  8:58 AM  Pulse 79 01/16/2018  8:59 AM  Resp 11 01/16/2018  8:59 AM  SpO2 100 % 01/16/2018  8:59 AM  Vitals shown include unvalidated device data.  Last Pain:  Vitals:   01/16/18 0603  TempSrc:   PainSc: 0-No pain      Patients Stated Pain Goal: 4 (21/97/58 8325)  Complications: No apparent anesthesia complications

## 2018-01-16 NOTE — Discharge Instructions (Signed)
NO ADVIL, ALEVE, MOTRIN, IBUPROFEN UNTIL 2:30 PM TODAY    Post Anesthesia Home Care Instructions  Activity: Get plenty of rest for the remainder of the day. A responsible individual must stay with you for 24 hours following the procedure.  For the next 24 hours, DO NOT: -Drive a car -Paediatric nurse -Drink alcoholic beverages -Take any medication unless instructed by your physician -Make any legal decisions or sign important papers.  Meals: Start with liquid foods such as gelatin or soup. Progress to regular foods as tolerated. Avoid greasy, spicy, heavy foods. If nausea and/or vomiting occur, drink only clear liquids until the nausea and/or vomiting subsides. Call your physician if vomiting continues.  Special Instructions/Symptoms: Your throat may feel dry or sore from the anesthesia or the breathing tube placed in your throat during surgery. If this causes discomfort, gargle with warm salt water. The discomfort should disappear within 24 hours.  If you had a scopolamine patch placed behind your ear for the management of post- operative nausea and/or vomiting:  1. The medication in the patch is effective for 72 hours, after which it should be removed.  Wrap patch in a tissue and discard in the trash. Wash hands thoroughly with soap and water. 2. You may remove the patch earlier than 72 hours if you experience unpleasant side effects which may include dry mouth, dizziness or visual disturbances. 3. Avoid touching the patch. Wash your hands with soap and water after contact with the patch.     DISCHARGE INSTRUCTIONS FOR KIDNEY STONE/URETERAL STENT   MEDICATIONS:  1.  Resume all your other meds from home - except do not take any extra narcotic pain meds that you may have at home.  2. Pyridium is to help with the burning/stinging when you urinate. 3. Tramadol is for moderate/severe pain, otherwise taking upto 1000 mg every 6 hours of plainTylenol will help treat your pain.   4.  Take Cipro one hour prior to removal of your stent.   ACTIVITY:  1. No strenuous activity x 1week  2. No driving while on narcotic pain medications  3. Drink plenty of water  4. Continue to walk at home - you can still get blood clots when you are at home, so keep active, but don't over do it.  5. May return to work/school tomorrow or when you feel ready   BATHING:  1. You can shower and we recommend daily showers  2. You have a string coming from your urethra: The stent string is attached to your ureteral stent. Do not pull on this.   SIGNS/SYMPTOMS TO CALL:  Please call us if you have a fever greater than 101.5, uncontrolled nausea/vomiting, uncontrolled pain, dizziness, unable to urinate, bloody urine, chest pain, shortness of breath, leg swelling, leg pain, redness around wound, drainage from wound, or any other concerns or questions.   You can reach [place] at [phone].   FOLLOW-UP:  1. You have an appointment in 6 weeks with a ultrasound of your kidneys prior.  2. You have a string attached to your stent, you may remove it on Wednesday July 10th. To do this, pull the strings until the stents are completely removed. You may feel an odd sensation in your back.   [facility] [phone] Post Ureteroscopy With or Without Stent Instructions  Definitions:  Ureter: The duct that transports urine from the kidney to the bladder. Stent:   A plastic hollow tube that is placed into the ureter, from the kidney to  the                 bladder to prevent the ureter from swelling shut.  GENERAL INSTRUCTIONS:  Despite the fact that no skin incisions were used, the area around the ureter and bladder is raw and irritated. The stent is a foreign body which will further irritate the bladder wall. This irritation is manifested by increased frequency of urination, both day and night, and by an increase in the urge to urinate. In some, the urge to urinate is present almost always.  Sometimes the urge is strong enough that you may not be able to stop yourself from urinating. The only real cure is to remove the stent and then give time for the bladder wall to heal which can't be done until the danger of the ureter swelling shut has passed, which varies.  You may see some blood in your urine while the stent is in place and a few days afterwards. Do not be alarmed, even if the urine was clear for a while. Get off your feet and drink lots of fluids until clearing occurs. If you start to pass clots or don't improve, call us.  DIET: You may return to your normal diet immediately. Because of the raw surface of your bladder, alcohol, spicy foods, acid type foods and drinks with caffeine may cause irritation or frequency and should be used in moderation. To keep your urine flowing freely and to avoid constipation, drink plenty of fluids during the day ( 8-10 glasses ). Tip: Avoid cranberry juice because it is very acidic.  ACTIVITY: Your physical activity doesn't need to be restricted. However, if you are very active, you may see some blood in your urine. We suggest that you reduce your activity under these circumstances until the bleeding has stopped.  BOWELS: It is important to keep your bowels regular during the postoperative period. Straining with bowel movements can cause bleeding. A bowel movement every other day is reasonable. Use a mild laxative if needed, such as Milk of Magnesia 2-3 tablespoons, or 2 Dulcolax tablets. Call if you continue to have problems. If you have been taking narcotics for pain, before, during or after your surgery, you may be constipated. Take a laxative if necessary.   MEDICATION: You should resume your pre-surgery medications unless told not to. In addition you will often be given an antibiotic to prevent infection. These should be taken as prescribed until the bottles are finished unless you are having an unusual reaction to one of the  drugs.  PROBLEMS YOU SHOULD REPORT TO [place]:  Fevers over 100.5 Fahrenheit.  Heavy bleeding, or clots ( See above notes about blood in urine ).  Inability to urinate.  Drug reactions ( hives, rash, nausea, vomiting, diarrhea ).  Severe burning or pain with urination that is not improving.  FOLLOW-UP: You will need a follow-up appointment to monitor your progress. Call for this appointment at the number listed above. Usually the first appointment will be about three to fourteen days after your surgery.

## 2018-01-16 NOTE — Addendum Note (Signed)
Addendum  created 01/16/18 1201 by Bonney Aid, CRNA   Intraprocedure Meds edited

## 2018-01-19 ENCOUNTER — Encounter (HOSPITAL_BASED_OUTPATIENT_CLINIC_OR_DEPARTMENT_OTHER): Payer: Self-pay | Admitting: Urology

## 2018-01-20 DIAGNOSIS — N201 Calculus of ureter: Secondary | ICD-10-CM | POA: Diagnosis not present

## 2018-01-23 DIAGNOSIS — N201 Calculus of ureter: Secondary | ICD-10-CM | POA: Diagnosis not present

## 2018-01-29 ENCOUNTER — Ambulatory Visit: Payer: 59 | Admitting: Hematology

## 2018-02-20 ENCOUNTER — Telehealth: Payer: Self-pay

## 2018-02-20 NOTE — Telephone Encounter (Signed)
Per Dr. Burr Medico okay for patient to do this test, called patient she has a 24 hour collection jug at home, instructed to put start date/time and end date/time on the jug.  She verbalized an understanding.

## 2018-02-20 NOTE — Telephone Encounter (Signed)
Patient calls wanting to know if she can do the 5HIAA 24 hour urine collection this weekend and turn in on 8/12.  [phone]

## 2018-02-20 NOTE — Telephone Encounter (Signed)
Yes, thanks .   Truitt Merle MD

## 2018-02-23 DIAGNOSIS — K760 Fatty (change of) liver, not elsewhere classified: Secondary | ICD-10-CM | POA: Insufficient documentation

## 2018-02-23 DIAGNOSIS — C7B04 Secondary carcinoid tumors of peritoneum: Secondary | ICD-10-CM | POA: Diagnosis not present

## 2018-02-23 DIAGNOSIS — Z87442 Personal history of urinary calculi: Secondary | ICD-10-CM | POA: Insufficient documentation

## 2018-02-23 DIAGNOSIS — Z79899 Other long term (current) drug therapy: Secondary | ICD-10-CM | POA: Insufficient documentation

## 2018-02-23 DIAGNOSIS — C7B02 Secondary carcinoid tumors of liver: Secondary | ICD-10-CM | POA: Diagnosis not present

## 2018-02-23 DIAGNOSIS — N200 Calculus of kidney: Secondary | ICD-10-CM | POA: Insufficient documentation

## 2018-02-23 DIAGNOSIS — K56609 Unspecified intestinal obstruction, unspecified as to partial versus complete obstruction: Secondary | ICD-10-CM | POA: Insufficient documentation

## 2018-02-23 DIAGNOSIS — C7B09 Secondary carcinoid tumors of other sites: Secondary | ICD-10-CM | POA: Insufficient documentation

## 2018-02-23 DIAGNOSIS — I700 Atherosclerosis of aorta: Secondary | ICD-10-CM | POA: Insufficient documentation

## 2018-02-23 DIAGNOSIS — E876 Hypokalemia: Secondary | ICD-10-CM | POA: Insufficient documentation

## 2018-02-23 DIAGNOSIS — C7A012 Malignant carcinoid tumor of the ileum: Secondary | ICD-10-CM | POA: Diagnosis not present

## 2018-02-23 DIAGNOSIS — I10 Essential (primary) hypertension: Secondary | ICD-10-CM | POA: Diagnosis not present

## 2018-02-24 ENCOUNTER — Inpatient Hospital Stay: Payer: 59 | Attending: Hematology

## 2018-02-24 DIAGNOSIS — C7A012 Malignant carcinoid tumor of the ileum: Secondary | ICD-10-CM | POA: Diagnosis not present

## 2018-02-24 DIAGNOSIS — C7B09 Secondary carcinoid tumors of other sites: Secondary | ICD-10-CM

## 2018-02-24 LAB — COMPREHENSIVE METABOLIC PANEL
ALBUMIN: 3.8 g/dL (ref 3.5–5.0)
ALT: 20 U/L (ref 0–44)
AST: 27 U/L (ref 15–41)
Alkaline Phosphatase: 65 U/L (ref 38–126)
Anion gap: 8 (ref 5–15)
BILIRUBIN TOTAL: 0.3 mg/dL (ref 0.3–1.2)
BUN: 12 mg/dL (ref 8–23)
CALCIUM: 9.1 mg/dL (ref 8.9–10.3)
CO2: 28 mmol/L (ref 22–32)
CREATININE: 0.92 mg/dL (ref 0.44–1.00)
Chloride: 104 mmol/L (ref 98–111)
GFR calc Af Amer: >60 mL/min (ref >60)
GFR calc non Af Amer: >60 mL/min (ref >60)
GLUCOSE: 86 mg/dL (ref 70–99)
Potassium: 4.4 mmol/L (ref 3.5–5.1)
Sodium: 140 mmol/L (ref 135–145)
TOTAL PROTEIN: 7.2 g/dL (ref 6.5–8.1)

## 2018-02-24 LAB — CBC WITH DIFFERENTIAL/PLATELET
BASOS ABS: 0.1 10*3/uL (ref 0.0–0.1)
Basophils Relative: 1 %
EOS ABS: 0.1 10*3/uL (ref 0.0–0.5)
Eosinophils Relative: 1 %
HCT: 41.7 % (ref 34.8–46.6)
Hemoglobin: 13.5 g/dL (ref 11.6–15.9)
Lymphocytes Relative: 44 %
Lymphs Abs: 2.5 10*3/uL (ref 0.9–3.3)
MCH: 29.7 pg (ref 25.1–34.0)
MCHC: 32.4 g/dL (ref 31.5–36.0)
MCV: 91.6 fL (ref 79.5–101.0)
MONO ABS: 0.5 10*3/uL (ref 0.1–0.9)
Monocytes Relative: 8 %
Neutro Abs: 2.6 10*3/uL (ref 1.5–6.5)
Neutrophils Relative %: 46 %
PLATELETS: 255 10*3/uL (ref 145–400)
RBC: 4.55 MIL/uL (ref 3.70–5.45)
RDW: 12.4 % (ref 11.2–14.5)
WBC: 5.7 10*3/uL (ref 3.9–10.3)

## 2018-02-24 NOTE — Progress Notes (Signed)
[place] OFFICE PROGRESS NOTE  Patient Care Team: [name], Hershal Coria as PCP - General (Physician Assistant)   Date of Service:  03/02/2018   DIAGNOSIS: 1. 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  2. 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 The patient underwent surgical resection of her carcinoid tumor on 08/17/2005. Two out of 13 lymph nodes were positive. The patient's stage was III. The patient was noted to have an elevated urinary 5HIAA level in July 2011. A positive octreotide scan was noted coming from the right lower pelvis adjacent to the bladder on 09/06/2010. Initially this was felt to be a uterine fibroid. The patient has continued to have positive octreotide scan on 11/07/2011 and 05/06/2012. CT scans of the abdomen and pelvis were also carried out on 03/02/2010, 02/24/2012 and 06/30/2012. These scans in conjunction with an elevated 5HIAA level of 22.7 on 04/13/2012 and a chromogranin A level which had risen to 18.0 on 03/23/2012 strongly suggested that the patient's disease had recurred. The patient required admission to the hospital from 06/30/2012 through 07/03/2012 for an episode of upper abdominal pain which occurred suddenly the day prior to admission. The patient was started on somatostatin LAR 30 mg IM on 07/07/2012 for recurrent disease involving the lateral segment of the left hepatic lobe, a right jejunal mesenteric node and 2 enhancing masses adjacent to the uterus. Twenty-four-hour urine 5HIAA on 10/19/2012 was 5.5. CT scan of the abdomen and pelvis with IV contrast on 10/19/2012 showed generally stable disease with the exception of a decrease in the right abdominal mesenteric lymphadenopathy. There have been 2 lesions in the liver which are felt to be stable. One  lesion is in the lateral segment of the left hepatic lobe measuring 14 x 14 mm. The second lesion is seen in the lateral segment adjacent to the falciform ligament and measures 9 mm.  2. Uterine fibroids noted on imaging studies dating back to late April 2010.  3. Elevated liver transaminases noted in March 2007. The patient was evaluated by Dr. Delfin Edis in October 2010 and felt that the etiology was hepatic steatosis.  4. Admission to the hospital for upper abdominal pain with sudden onset. Admission dates were 06/30/2012 through 07/03/2012. CT scan of the abdomen and pelvis with IV contrast on 06/30/2012 showed no definite etiology for the pain. It was noted that a lymph node in the right jejunal mesentery had developed central necrosis when compared with the prior study of 02/24/2012. There was no evidence for bowel obstruction. There has previously been no evidence for gallstones on recent CT scans, and an abdominal ultrasound dating back to 05/08/2009.  5. Possible mass involving the left breast on screening mammogram from 06/18/2012. A diagnostic mammogram and ultrasound of the left breast carried out on 07/17/2012 were normal. Followup screening mammogram was suggested in 1 year.  6. Diarrhea with onset in December 2013, improved with treatment and use Imodium as needed.   CURRENT THERAPY:  Sandostatin LAR 30 mg IM every 4 weeks, started on 07/07/2012. Sandostatin LAR increased to 60 mg IM every 4 weeks on 08/27/2017 due to disease progression. Held from5/20/2019 per pt's request    INTERVAL HISTORY:  Vanessa Waller returns for follow up. She is here alone. She is feeling well overall. Her diarrhea has improved tremendously. Her last  injection was in April.  She has episodes of flushing. She works as an Optometrist and is trying to maintain healthy diet, but says the exercising is a little difficult due to her job.     REVIEW OF SYSTEMS:   Constitutional: Denies fevers, chills or abnormal  weight loss (+) flushing Eyes: Denies blurriness of vision Ears, nose, mouth, throat, and face: Denies mucositis or sore throat Respiratory: Denies cough, dyspnea or wheezes Cardiovascular: Denies palpitation, chest discomfort or lower extremity swelling Gastrointestinal:  Denies nausea, heartburn. (+) occasional diarrhea, improving Skin: Denies abnormal skin rashes Lymphatics: Denies new lymphadenopathy or easy bruising Neurological:Denies numbness, tingling or new weaknesses Behavioral/Psych: Mood is stable, no new changes  All other systems were reviewed with the patient and are negative.  MEDICAL HISTORY:  Past Medical History:  Diagnosis Date  . 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 carcinoid tumor dx'd 08/2005  . Diverticulosis   . Hepatic steatosis 02/24/12  . History of kidney stones    x 2   . Hypopotassemia   . 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 ([place])    liver,mesentery,pelvis  . Nonspecific elevation of levels of transaminase or lactic acid dehydrogenase (LDH)   . Small bowel obstruction ([place])   . Uterine fibroid     SURGICAL HISTORY: Past Surgical History:  Procedure Laterality Date  . carcinoid tumor resection  2007   ileum  . colonscopy  not sure  . CYSTOSCOPY/URETEROSCOPY/HOLMIUM LASER/STENT PLACEMENT Right 01/16/2018   Procedure: RIGHT URETEROSCOPY/RIGHT RETROGRADE/HOLMIUM LASER/STENT PLACEMENT;  Surgeon: Ardis Hughs, MD;  Location: [facility];  Service: Urology;  Laterality: Right;  . TUBAL LIGATION      I have reviewed the social history and family history with the patient and they are unchanged from previous note.  ALLERGIES:  is allergic to cephalexin and amoxicillin.  MEDICATIONS:  Current Outpatient Medications  Medication Sig Dispense Refill  . calcium acetate (PHOSLO) 667 MG capsule Take by mouth every evening.     . Cholecalciferol (VITAMIN D-3) 5000 UNITS TABS Take 1 tablet by mouth every evening.      . Multiple Vitamin (MULTIVITAMIN) tablet Take 1 tablet by mouth every evening.     . potassium chloride SA (K-DUR,KLOR-CON) 20 MEQ tablet Take 1 tablet (20 mEq total) by mouth every other day. 90 tablet 1  . loperamide (IMODIUM) 2 MG capsule Take by mouth as needed for diarrhea or loose stools.    [name] octreotide (SANDOSTATIN LAR) 30 MG injection Inject 30 mg into the muscle every 28 (twenty-eight) days.     No current facility-administered medications for this visit.     PHYSICAL EXAMINATION:  ECOG PERFORMANCE STATUS: 0 - Asymptomatic  Vitals:   03/02/18 1512  BP: (!) 146/78  Pulse: 92  Resp: 18  Temp: 98.8 F (37.1 C)  SpO2: 99%   Filed Weights   03/02/18 1512  Weight: 138 lb 14.4 oz (63 kg)    GENERAL:alert, no distress and comfortable SKIN: skin color, texture, turgor are normal, no rashes or significant lesions EYES: normal, Conjunctiva are pink and non-injected, sclera clear OROPHARYNX:no exudate, no erythema and lips, buccal mucosa, and tongue normal  NECK: supple, thyroid normal size, non-tender, without nodularity LYMPH:  no palpable lymphadenopathy in the cervical, axillary or inguinal LUNGS: clear to auscultation and percussion with normal breathing effort HEART: regular rate & rhythm and no murmurs and no lower extremity edema ABDOMEN:abdomen soft, non-tender and normal bowel sounds. No hepatomegaly.  Musculoskeletal:no cyanosis  of digits and no clubbing  NEURO: alert & oriented x 3 with fluent speech, no focal motor/sensory deficits  LABORATORY DATA:  I have reviewed the data as listed CBC Latest Ref Rng & Units 02/24/2018 01/16/2018 12/01/2017  WBC 3.9 - 10.3 K/uL 5.7 - 6.5  Hemoglobin 11.6 - 15.9 g/dL 13.5 15.0 13.5  Hematocrit 34.8 - 46.6 % 41.7 44.0 41.8  Platelets 145 - 400 K/uL 255 - 346    CMP Latest Ref Rng & Units 02/24/2018 01/16/2018 12/01/2017  Glucose 70 - 99 mg/dL 86 93 127  BUN 8 - 23 mg/dL 12 11 13   Creatinine 0.44 - 1.00 mg/dL 0.92 0.90 0.92   Sodium 135 - 145 mmol/L 140 143 140  Potassium 3.5 - 5.1 mmol/L 4.4 4.1 3.8  Chloride 98 - 111 mmol/L 104 100 104  CO2 22 - 32 mmol/L 28 - 29  Calcium 8.9 - 10.3 mg/dL 9.1 - 9.3  Total Protein 6.5 - 8.1 g/dL 7.2 - 7.7  Total Bilirubin 0.3 - 1.2 mg/dL 0.3 - 0.4  Alkaline Phos 38 - 126 U/L 65 - 75  AST 15 - 41 U/L 27 - 39(H)  ALT 0 - 44 U/L 20 - 33   Chromogranin A 08/05/16: 5 09/30/16: 5 11/25/16: 3 01/20/17: 5 04/14/17: 5 05/23/17: 4 09/29/17: 4 12/01/17: 7 02/24/2018: 15   5-HIAA 24 urine (mg/24h): 06/17/2016: 5.1 12/23/2016:5.3 05/19/2017: 9.1  02/23/2018: 23    RADIOGRAPHIC STUDIES:  PET Scan, 08/20/2017 IMPRESSION: PET scan on 08/20/17 with results of: Intense radiotracer activity associated with multiple hyperdense liver lesions consistent with well differentiated neuroendocrine tumor hepatic metastasis. Multiple small foci peritoneal metastasis within the lower abdomen and pelvis. Large mass associated the RIGHT adnexa and small lesions in LEFT adnexa also with intense radiotracer activity consistent with neuroendocrine tumor metastasis to the adnexa. No evidence of pulmonary metastasis or skeletal metastasis.  CT CAP W Contrast 05/19/17  IMPRESSION: 1. Minimal disease progression. Hypervascular liver lesions, some of which have enlarged minimally. Peritoneal metastasis, with mild progression of the dominant right adnexal mass compared to 06/17/16. Slight enlargement of a mesenteric node. 2. No new sites of disease identified. 3.  Aortic Atherosclerosis (ICD10-I70.0). 4. Hepatic steatosis. 5.  No acute process or evidence of metastatic disease in the chest. 6. Right nephrolithiasis.  CT Chest, Abdomen w contrast 02/10/2017 IMPRESSION: 1. Multifocal arterial phase liver lesions are stable to slightly progressed in the interval. 2. Stable small peritoneal nodules.  CT CAP W CONTRAST 06/17/16 IMPRESSION: 1. Continued stability of multifocal metastatic disease involving the  liver, peritoneum M and bilateral adnexum. No significant disease progression identified. 2. No significant thoracic findings 3. Aortic atherosclerosis 4. Hepatic steatosis.  CT chest, abdomen and pelvis 01/15/2016 IMPRESSION: 1. Continued stability of multifocal metastatic disease involving the liver, peritoneum and bilateral adnexa. No disease progression identified. 2. No significant thoracic findings. 3. Hepatic steatosis. 4.  Aortic Atherosclerosis (ICD10-170.0)   ASSESSMENT & PLAN:   62 y.o. female with  1. Metastatic carcinoid tumor of ileum to liver, peritoneum and adnexa -We previously reviewed the natural history of metastatic carcinoid tumor. She understands this is an incurable disease, and the goal of therapy is disease control and prolong her life. -We also previously reviewed the overall treatment strategy for metastatic carcinoid tumor, she understands her disease will probably progress at some point, and we'll move on to second or third line treatment, such as underlying Everolimus or chemotherapy. -We previously  discussed the role of liver targeted therapy, and  new treatment option 177 Lu-Dotatate in the future if needed  -She has been doing very well clinically, chromogranin A and urine 5 HIAA level are mostly normal. - CT Scan reviewed from 02/10/2017 shows slightly progression in liver. She is asymptomatic, lab results are stable, I recommend continue Sandostatin injection, with close monitoring. We will monitor closer with labs every 2 months and scans every 4 months instead. -We discussed the CT CAP from 05/19/17 shows mild disease progression in her known lesions. No new sites of disease identified. I compared to her previous scan since 2013, I think that she has had overall disease progression, especially in the liver.   -Donatate PET scan on 08/20/17 with results of: Intense radiotracer activity associated with multiple hyperdense liver lesions consistent with well  differentiated neuroendocrine tumor hepatic metastasis. Multiple small foci peritoneal metastasis within the lower abdomen and pelvis. Large mass associated the RIGHT adnexa and small lesions in LEFT adnexa also with intense radiotracer activity consistent with neuroendocrine tumor metastasis to the adnexa. No evidence of pulmonary metastasis or skeletal metastasis. -Pt tried higher dose of metastatic injection, did not feel any difference, and stopped the injection per her request in May 2019. She is off treatment now.  -We again discussed that due to her disease progression, I recommend changing her treatment. We discussed the option of continuing Sandostatin injection dose at 60 mg every 4 weeks, Lutathera every 2 months for 4 treatments, or oral second-line Afinitor. We also discussed other options such as Y-90, sunitinib etc. I discussed the benefit and risks with of each option with patient. I recommend Lutathera. Patient would like to think about it, and still has not made a decision.  -She is clinically doing well, diarrhea actually has improved since she stopped Sandostatin injection.  However she has developed mild flushing episodes a few times a week.  We discussed the consequence of untreated metastatic neuroendocrine tumor, including liver failure, heart disease etc. -I recommend her to see Dr. Leamon Arnt at [facility] for a second opinion, to help her make a decision.  She was previous seen by Dr. Leamon Arnt in 2014. She agrees. Referral was made today  -Labs reviewed, CBC and CMP are WNL, her serum  chromogranin A and 24-hour urine 5-HIAA has increased recently since she came off treatment. -F/u in 1 month.  Plan to repeat her staging scan before next line treatment.   2. Hypokalemia -Probably related to her diarrhea, K 3.7 on 06/10/16. -She is on potassium 1 tablet every other day, we'll continue. -Her K has been normal lately. She will stop KCL since her diarrhea has resolved   3. HTN -Her blood  pressure was elevated. I previously encouraged her to monitor her blood pressure home. She states her BP has been normal at home and other office  -Follow-up with primary care physician.  4. Cancer screening -She is overdue for mammogram, last mammogram in July 2015. -The patient forgot to schedule her breast mammogram. I previously encouraged her to reschedule. -She will continue follow up with her PCP and other specialists.   PLAN -she will continue treatment break for now, she has not made her decision about her next treatment  - f/u in 1 month -She is interested in talking to Dr. Leamon Arnt at [facility] before deciding on a treatment plan. I will refer.  -I will repeat staging CT scan when she decides on a treatment option   All questions were answered. The patient knows to call the clinic with any problems,  questions or concerns. No barriers to learning was detected.  I spent 20 minutes counseling the patient face to face. The total time spent in the appointment was 25 minutes and more than 50% was on counseling and review of test results  I, Noor Dweik am acting as scribe for Dr. Truitt Merle.  I have reviewed the above documentation for accuracy and completeness, and I agree with the above.   Truitt Merle 03/02/18

## 2018-02-25 LAB — CHROMOGRANIN A: CHROMOGRANIN A: 15 nmol/L — AB (ref 0–5)

## 2018-02-26 LAB — 5 HIAA, QUANTITATIVE, URINE, 24 HOUR
5-HIAA, Ur: 12.2 mg/L
5-HIAA,QUANT.,24 HR URINE: 23.2 mg/(24.h) — AB (ref 0.0–14.9)
Total Volume: 1900

## 2018-02-27 DIAGNOSIS — N202 Calculus of kidney with calculus of ureter: Secondary | ICD-10-CM | POA: Diagnosis not present

## 2018-03-02 ENCOUNTER — Telehealth: Payer: Self-pay | Admitting: Hematology

## 2018-03-02 ENCOUNTER — Encounter: Payer: Self-pay | Admitting: Hematology

## 2018-03-02 ENCOUNTER — Inpatient Hospital Stay (HOSPITAL_BASED_OUTPATIENT_CLINIC_OR_DEPARTMENT_OTHER): Payer: 59 | Admitting: Hematology

## 2018-03-02 VITALS — BP 146/78 | HR 92 | Temp 98.8°F | Resp 18 | Ht 61.0 in | Wt 138.9 lb

## 2018-03-02 DIAGNOSIS — C7B04 Secondary carcinoid tumors of peritoneum: Secondary | ICD-10-CM

## 2018-03-02 DIAGNOSIS — Z87442 Personal history of urinary calculi: Secondary | ICD-10-CM

## 2018-03-02 DIAGNOSIS — I10 Essential (primary) hypertension: Secondary | ICD-10-CM

## 2018-03-02 DIAGNOSIS — C7B02 Secondary carcinoid tumors of liver: Secondary | ICD-10-CM

## 2018-03-02 DIAGNOSIS — N200 Calculus of kidney: Secondary | ICD-10-CM

## 2018-03-02 DIAGNOSIS — K760 Fatty (change of) liver, not elsewhere classified: Secondary | ICD-10-CM

## 2018-03-02 DIAGNOSIS — C7B09 Secondary carcinoid tumors of other sites: Secondary | ICD-10-CM | POA: Diagnosis not present

## 2018-03-02 DIAGNOSIS — K56609 Unspecified intestinal obstruction, unspecified as to partial versus complete obstruction: Secondary | ICD-10-CM

## 2018-03-02 DIAGNOSIS — Z79899 Other long term (current) drug therapy: Secondary | ICD-10-CM

## 2018-03-02 DIAGNOSIS — E876 Hypokalemia: Secondary | ICD-10-CM

## 2018-03-02 DIAGNOSIS — C7A012 Malignant carcinoid tumor of the ileum: Secondary | ICD-10-CM | POA: Diagnosis not present

## 2018-03-02 DIAGNOSIS — I700 Atherosclerosis of aorta: Secondary | ICD-10-CM

## 2018-03-02 NOTE — Telephone Encounter (Signed)
Appts scheduled AVS/Calendar printed per 8/19 los °

## 2018-03-03 ENCOUNTER — Telehealth: Payer: Self-pay | Admitting: Hematology

## 2018-03-03 NOTE — Telephone Encounter (Signed)
Faxed records to Dr. Leamon Arnt at [place]

## 2018-03-10 DIAGNOSIS — C7A00 Malignant carcinoid tumor of unspecified site: Secondary | ICD-10-CM | POA: Diagnosis not present

## 2018-03-10 DIAGNOSIS — R197 Diarrhea, unspecified: Secondary | ICD-10-CM | POA: Diagnosis not present

## 2018-03-24 DIAGNOSIS — C7B8 Other secondary neuroendocrine tumors: Secondary | ICD-10-CM | POA: Diagnosis not present

## 2018-04-15 ENCOUNTER — Other Ambulatory Visit: Payer: Self-pay | Admitting: Hematology

## 2018-04-17 NOTE — Progress Notes (Signed)
[place] OFFICE PROGRESS NOTE  Patient Care Team: [name], Hershal Coria as PCP - General (Physician Assistant)   Date of Service:  04/20/2018   DIAGNOSIS: 1. 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  2. 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 The patient underwent surgical resection of her carcinoid tumor on 08/17/2005. Two out of 13 lymph nodes were positive. The patient's stage was III. The patient was noted to have an elevated urinary 5HIAA level in July 2011. A positive octreotide scan was noted coming from the right lower pelvis adjacent to the bladder on 09/06/2010. Initially this was felt to be a uterine fibroid. The patient has continued to have positive octreotide scan on 11/07/2011 and 05/06/2012. CT scans of the abdomen and pelvis were also carried out on 03/02/2010, 02/24/2012 and 06/30/2012. These scans in conjunction with an elevated 5HIAA level of 22.7 on 04/13/2012 and a chromogranin A level which had risen to 18.0 on 03/23/2012 strongly suggested that the patient's disease had recurred. The patient required admission to the hospital from 06/30/2012 through 07/03/2012 for an episode of upper abdominal pain which occurred suddenly the day prior to admission. The patient was started on somatostatin LAR 30 mg IM on 07/07/2012 for recurrent disease involving the lateral segment of the left hepatic lobe, a right jejunal mesenteric node and 2 enhancing masses adjacent to the uterus. Twenty-four-hour urine 5HIAA on 10/19/2012 was 5.5. CT scan of the abdomen and pelvis with IV contrast on 10/19/2012 showed generally stable disease with the exception of a decrease in the right abdominal mesenteric lymphadenopathy. There have been 2 lesions in the liver which are felt to be stable. One  lesion is in the lateral segment of the left hepatic lobe measuring 14 x 14 mm. The second lesion is seen in the lateral segment adjacent to the falciform ligament and measures 9 mm.  2. Uterine fibroids noted on imaging studies dating back to late April 2010.  3. Elevated liver transaminases noted in March 2007. The patient was evaluated by Dr. Delfin Edis in October 2010 and felt that the etiology was hepatic steatosis.  4. Admission to the hospital for upper abdominal pain with sudden onset. Admission dates were 06/30/2012 through 07/03/2012. CT scan of the abdomen and pelvis with IV contrast on 06/30/2012 showed no definite etiology for the pain. It was noted that a lymph node in the right jejunal mesentery had developed central necrosis when compared with the prior study of 02/24/2012. There was no evidence for bowel obstruction. There has previously been no evidence for gallstones on recent CT scans, and an abdominal ultrasound dating back to 05/08/2009.  5. Possible mass involving the left breast on screening mammogram from 06/18/2012. A diagnostic mammogram and ultrasound of the left breast carried out on 07/17/2012 were normal. Followup screening mammogram was suggested in 1 year.  6. Diarrhea with onset in December 2013, improved with treatment and use Imodium as needed.   CURRENT THERAPY:  Sandostatin LAR 30 mg IM every 4 weeks, started on 07/07/2012. Sandostatin LAR increased to 60 mg IM every 4 weeks on 08/27/2017 due to disease progression. Held from5/20/2019 per pt's request    INTERVAL HISTORY:  Vanessa Waller returns for follow up. She is here alone at the clinic. She feels good and denies new symptoms.  She  has seen Dr. Leamon Arnt who discussed several treatment options with her.     REVIEW OF SYSTEMS:   Constitutional: Denies fevers, chills or abnormal weight loss  Eyes: Denies blurriness of vision Ears, nose, mouth, throat, and face: Denies mucositis or sore throat Respiratory:  Denies cough, dyspnea or wheezes Cardiovascular: Denies palpitation, chest discomfort or lower extremity swelling Gastrointestinal:  Denies nausea, heartburn. No new BM changes Skin: Denies abnormal skin rashes Lymphatics: Denies new lymphadenopathy or easy bruising Neurological:Denies numbness, tingling or new weaknesses Behavioral/Psych: Mood is stable, no new changes  All other systems were reviewed with the patient and are negative.  MEDICAL HISTORY:  Past Medical History:  Diagnosis Date  . 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 carcinoid tumor dx'd 08/2005  . Diverticulosis   . Hepatic steatosis 02/24/12  . History of kidney stones    x 2   . Hypopotassemia   . 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 ([place])    liver,mesentery,pelvis  . Nonspecific elevation of levels of transaminase or lactic acid dehydrogenase (LDH)   . Small bowel obstruction ([place])   . Uterine fibroid     SURGICAL HISTORY: Past Surgical History:  Procedure Laterality Date  . carcinoid tumor resection  2007   ileum  . colonscopy  not sure  . CYSTOSCOPY/URETEROSCOPY/HOLMIUM LASER/STENT PLACEMENT Right 01/16/2018   Procedure: RIGHT URETEROSCOPY/RIGHT RETROGRADE/HOLMIUM LASER/STENT PLACEMENT;  Surgeon: Ardis Hughs, MD;  Location: [facility];  Service: Urology;  Laterality: Right;  . TUBAL LIGATION      I have reviewed the social history and family history with the patient and they are unchanged from previous note.  ALLERGIES:  is allergic to cephalexin and amoxicillin.  MEDICATIONS:  Current Outpatient Medications  Medication Sig Dispense Refill  . calcium acetate (PHOSLO) 667 MG capsule Take by mouth every evening.     . Cholecalciferol (VITAMIN D-3) 5000 UNITS TABS Take 1 tablet by mouth every evening.     . loperamide (IMODIUM) 2 MG capsule Take by mouth as needed for diarrhea or loose stools.    . Multiple Vitamin (MULTIVITAMIN) tablet Take 1 tablet by mouth  every evening.     [name] octreotide (SANDOSTATIN LAR) 30 MG injection Inject 30 mg into the muscle every 28 (twenty-eight) days.    . potassium chloride SA (K-DUR,KLOR-CON) 20 MEQ tablet TAKE 1 TABLET BY MOUTH  EVERY OTHER DAY (Patient taking differently: 20 mEq. Taking once or twice weekly) 45 tablet 1   No current facility-administered medications for this visit.     PHYSICAL EXAMINATION:  ECOG PERFORMANCE STATUS: 0 - Asymptomatic  Vitals:   04/20/18 1526  BP: (!) 150/82  Pulse: 83  Resp: 18  Temp: 98.1 F (36.7 C)  SpO2: 100%   Filed Weights   04/20/18 1526  Weight: 140 lb 11.2 oz (63.8 kg)    GENERAL:alert, no distress and comfortable SKIN: skin color, texture, turgor are normal, no rashes or significant lesions EYES: normal, Conjunctiva are pink and non-injected, sclera clear OROPHARYNX:no exudate, no erythema and lips, buccal mucosa, and tongue normal  NECK: supple, thyroid normal size, non-tender, without nodularity LYMPH:  no palpable lymphadenopathy in the cervical, axillary or inguinal LUNGS: clear to auscultation and percussion with normal breathing effort HEART: regular rate & rhythm and no murmurs and no lower extremity edema ABDOMEN:abdomen soft, non-tender and normal bowel sounds. No hepatomegaly.  Musculoskeletal:no cyanosis of digits and no clubbing  NEURO: alert & oriented x 3 with fluent speech, no  focal motor/sensory deficits  LABORATORY DATA:  I have reviewed the data as listed CBC Latest Ref Rng & Units 02/24/2018 01/16/2018 12/01/2017  WBC 3.9 - 10.3 K/uL 5.7 - 6.5  Hemoglobin 11.6 - 15.9 g/dL 13.5 15.0 13.5  Hematocrit 34.8 - 46.6 % 41.7 44.0 41.8  Platelets 145 - 400 K/uL 255 - 346    CMP Latest Ref Rng & Units 02/24/2018 01/16/2018 12/01/2017  Glucose 70 - 99 mg/dL 86 93 127  BUN 8 - 23 mg/dL 12 11 13   Creatinine 0.44 - 1.00 mg/dL 0.92 0.90 0.92  Sodium 135 - 145 mmol/L 140 143 140  Potassium 3.5 - 5.1 mmol/L 4.4 4.1 3.8  Chloride 98 - 111 mmol/L 104  100 104  CO2 22 - 32 mmol/L 28 - 29  Calcium 8.9 - 10.3 mg/dL 9.1 - 9.3  Total Protein 6.5 - 8.1 g/dL 7.2 - 7.7  Total Bilirubin 0.3 - 1.2 mg/dL 0.3 - 0.4  Alkaline Phos 38 - 126 U/L 65 - 75  AST 15 - 41 U/L 27 - 39(H)  ALT 0 - 44 U/L 20 - 33   Chromogranin A 08/05/16: 5 09/30/16: 5 11/25/16: 3 01/20/17: 5 04/14/17: 5 05/23/17: 4 09/29/17: 4 12/01/17: 7 02/24/2018: 15   5-HIAA 24 urine (mg/24h): 06/17/2016: 5.1 12/23/2016:5.3 05/19/2017: 9.1  02/23/2018: 23    RADIOGRAPHIC STUDIES:  PET Scan, 08/20/2017 IMPRESSION: PET scan on 08/20/17 with results of: Intense radiotracer activity associated with multiple hyperdense liver lesions consistent with well differentiated neuroendocrine tumor hepatic metastasis. Multiple small foci peritoneal metastasis within the lower abdomen and pelvis. Large mass associated the RIGHT adnexa and small lesions in LEFT adnexa also with intense radiotracer activity consistent with neuroendocrine tumor metastasis to the adnexa. No evidence of pulmonary metastasis or skeletal metastasis.  CT CAP W Contrast 05/19/17  IMPRESSION: 1. Minimal disease progression. Hypervascular liver lesions, some of which have enlarged minimally. Peritoneal metastasis, with mild progression of the dominant right adnexal mass compared to 06/17/16. Slight enlargement of a mesenteric node. 2. No new sites of disease identified. 3.  Aortic Atherosclerosis (ICD10-I70.0). 4. Hepatic steatosis. 5.  No acute process or evidence of metastatic disease in the chest. 6. Right nephrolithiasis.  CT Chest, Abdomen w contrast 02/10/2017 IMPRESSION: 1. Multifocal arterial phase liver lesions are stable to slightly progressed in the interval. 2. Stable small peritoneal nodules.  CT CAP W CONTRAST 06/17/16 IMPRESSION: 1. Continued stability of multifocal metastatic disease involving the liver, peritoneum M and bilateral adnexum. No significant disease progression identified. 2. No significant  thoracic findings 3. Aortic atherosclerosis 4. Hepatic steatosis.  CT chest, abdomen and pelvis 01/15/2016 IMPRESSION: 1. Continued stability of multifocal metastatic disease involving the liver, peritoneum and bilateral adnexa. No disease progression identified. 2. No significant thoracic findings. 3. Hepatic steatosis. 4.  Aortic Atherosclerosis (ICD10-170.0)   ASSESSMENT & PLAN:   62 y.o. female with  1. Metastatic carcinoid tumor of ileum to liver, peritoneum and adnexa -We previously reviewed the natural history of metastatic carcinoid tumor. She understands this is an incurable disease, and the goal of therapy is disease control and prolong her life. -We also previously reviewed the overall treatment strategy for metastatic carcinoid tumor, she understands her disease will probably progress at some point, and we'll move on to second or third line treatment, such as underlying Everolimus or chemotherapy. -We previously  discussed the role of liver targeted therapy, and new treatment option 177 Lu-Dotatate in the future if needed  -She has been doing very  well clinically, chromogranin A and urine 5 HIAA level are mostly normal. - CT Scan reviewed from 02/10/2017 shows slightly progression in liver. She is asymptomatic, lab results are stable, I recommend continue Sandostatin injection, with close monitoring. We will monitor closer with labs every 2 months and scans every 4 months instead. -We discussed the CT CAP from 05/19/17 shows mild disease progression in her known lesions. No new sites of disease identified. I compared to her previous scan since 2013, I think that she has had overall disease progression, especially in the liver.   -[place] PET scan on 08/20/17 with results of: Intense radiotracer activity associated with multiple hyperdense liver lesions consistent with well differentiated neuroendocrine tumor hepatic metastasis. Multiple small foci peritoneal metastasis within the lower  abdomen and pelvis. Large mass associated the RIGHT adnexa and small lesions in LEFT adnexa also with intense radiotracer activity consistent with neuroendocrine tumor metastasis to the adnexa. No evidence of pulmonary metastasis or skeletal metastasis. -Pt tried higher dose of metastatic injection, did not feel any difference, and stopped the injection per her request in May 2019. She is off treatment now.  -We again discussed that due to her disease progression, I recommend changing her treatment. We discussed the option of continuing Sandostatin injection dose at 60 mg every 4 weeks, Lutathera every 2 months for 4 treatments, or oral second-line Afinitor. We also discussed other options such as Y-90, sunitinib etc. I discussed the benefit and risks with of each option with patient. I recommend Lutathera. Patient would like to think about it, and still has not made a decision.  -She is clinically doing well, diarrhea actually has improved since she stopped Sandostatin injection.  However she has developed mild flushing episodes a few times a week.  We discussed the consequence of untreated metastatic neuroendocrine tumor, including liver failure, heart disease etc. -she recently saw Dr. Leamon [facility]nt at [facility] for a second opinion, options were discussed.  -She is clinically doing well, no pain or other new symptoms, but she has decided to Sandostatin injection or switch to Treatment if needed. -She has been off Sandostatin injection since April 2019, and her last PET scan was in February 2019.  I will repeat CT scan and labs in 2-3 weeks, and decide her next treatment based on CT results.  If she has significant disease progression, I will recommend switching to Lutathera, if scan shows stable disease, will resume Sandostatin injection. I will call her after her CT scan  -f/u will be determined based on her next treatment    2. Hypokalemia -Probably related to her diarrhea, K 3.7 on 06/10/16. -She is on  potassium 1 tablet every other day, we'll continue. -Her K has been normal lately. She will stop KCL since her diarrhea has resolved   3. HTN -Her blood pressure was elevated. I previously encouraged her to monitor her blood pressure home. She states her BP has been normal at home and other office  -Follow-up with primary care physician.  4. Cancer screening -She is overdue for mammogram, last mammogram in July 2015. -The patient forgot to schedule her breast mammogram. I previously encouraged her to reschedule. -She will continue follow up with her PCP and other specialists.   PLAN -Lab, and CT CAP with contrast in 1-2 weeks  -I will call her after her CT scan, to decide if she were to restart Sandostatin injection, or switch to [place] therapy based on her CT findings   All questions were answered. The patient  knows to call the clinic with any problems, questions or concerns. No barriers to learning was detected.  I spent 20 minutes counseling the patient face to face. The total time spent in the appointment was 25 minutes and more than 50% was on counseling and review of test results  I, Noor Dweik am acting as scribe for Dr. Truitt Merle.  I have reviewed the above documentation for accuracy and completeness, and I agree with the above.   Truitt Merle 04/20/18

## 2018-04-20 ENCOUNTER — Inpatient Hospital Stay: Payer: 59 | Attending: Hematology | Admitting: Hematology

## 2018-04-20 ENCOUNTER — Telehealth: Payer: Self-pay | Admitting: Hematology

## 2018-04-20 ENCOUNTER — Encounter: Payer: Self-pay | Admitting: Hematology

## 2018-04-20 VITALS — BP 150/82 | HR 83 | Temp 98.1°F | Resp 18 | Ht 61.0 in | Wt 140.7 lb

## 2018-04-20 DIAGNOSIS — I10 Essential (primary) hypertension: Secondary | ICD-10-CM | POA: Diagnosis not present

## 2018-04-20 DIAGNOSIS — C7B02 Secondary carcinoid tumors of liver: Secondary | ICD-10-CM | POA: Diagnosis not present

## 2018-04-20 DIAGNOSIS — C7A012 Malignant carcinoid tumor of the ileum: Secondary | ICD-10-CM | POA: Diagnosis present

## 2018-04-20 DIAGNOSIS — C7B09 Secondary carcinoid tumors of other sites: Secondary | ICD-10-CM | POA: Insufficient documentation

## 2018-04-20 DIAGNOSIS — Z136 Encounter for screening for cardiovascular disorders: Secondary | ICD-10-CM | POA: Diagnosis not present

## 2018-04-20 DIAGNOSIS — K760 Fatty (change of) liver, not elsewhere classified: Secondary | ICD-10-CM | POA: Diagnosis not present

## 2018-04-20 DIAGNOSIS — Z87442 Personal history of urinary calculi: Secondary | ICD-10-CM | POA: Insufficient documentation

## 2018-04-20 DIAGNOSIS — E876 Hypokalemia: Secondary | ICD-10-CM | POA: Diagnosis not present

## 2018-04-20 DIAGNOSIS — I700 Atherosclerosis of aorta: Secondary | ICD-10-CM | POA: Diagnosis not present

## 2018-04-20 DIAGNOSIS — Z0000 Encounter for general adult medical examination without abnormal findings: Secondary | ICD-10-CM | POA: Diagnosis not present

## 2018-04-20 DIAGNOSIS — N200 Calculus of kidney: Secondary | ICD-10-CM | POA: Insufficient documentation

## 2018-04-20 DIAGNOSIS — Z79899 Other long term (current) drug therapy: Secondary | ICD-10-CM | POA: Insufficient documentation

## 2018-04-20 DIAGNOSIS — Z23 Encounter for immunization: Secondary | ICD-10-CM | POA: Diagnosis not present

## 2018-04-20 DIAGNOSIS — K56609 Unspecified intestinal obstruction, unspecified as to partial versus complete obstruction: Secondary | ICD-10-CM | POA: Insufficient documentation

## 2018-04-20 DIAGNOSIS — R002 Palpitations: Secondary | ICD-10-CM | POA: Diagnosis not present

## 2018-04-20 NOTE — Telephone Encounter (Signed)
Appt scheduled avs printed and number for central radiology was given to patient for CT per 10/7 los

## 2018-04-23 ENCOUNTER — Other Ambulatory Visit: Payer: Self-pay

## 2018-04-23 DIAGNOSIS — C7B09 Secondary carcinoid tumors of other sites: Secondary | ICD-10-CM

## 2018-04-24 ENCOUNTER — Other Ambulatory Visit: Payer: Self-pay

## 2018-04-24 ENCOUNTER — Inpatient Hospital Stay: Payer: 59

## 2018-04-24 DIAGNOSIS — C7B09 Secondary carcinoid tumors of other sites: Secondary | ICD-10-CM

## 2018-04-24 DIAGNOSIS — C7A012 Malignant carcinoid tumor of the ileum: Secondary | ICD-10-CM | POA: Diagnosis not present

## 2018-04-24 LAB — CBC WITH DIFFERENTIAL/PLATELET
ABS IMMATURE GRANULOCYTES: 0.01 10*3/uL (ref 0.00–0.07)
BASOS PCT: 1 %
Basophils Absolute: 0.1 10*3/uL (ref 0.0–0.1)
Eosinophils Absolute: 0.1 10*3/uL (ref 0.0–0.5)
Eosinophils Relative: 1 %
HCT: 43.3 % (ref 36.0–46.0)
Hemoglobin: 13.9 g/dL (ref 12.0–15.0)
IMMATURE GRANULOCYTES: 0 %
Lymphocytes Relative: 42 %
Lymphs Abs: 2.2 10*3/uL (ref 0.7–4.0)
MCH: 29.1 pg (ref 26.0–34.0)
MCHC: 32.1 g/dL (ref 30.0–36.0)
MCV: 90.6 fL (ref 80.0–100.0)
MONOS PCT: 8 %
Monocytes Absolute: 0.4 10*3/uL (ref 0.1–1.0)
NEUTROS ABS: 2.6 10*3/uL (ref 1.7–7.7)
NEUTROS PCT: 48 %
PLATELETS: 291 10*3/uL (ref 150–400)
RBC: 4.78 MIL/uL (ref 3.87–5.11)
RDW: 11.9 % (ref 11.5–15.5)
WBC: 5.3 10*3/uL (ref 4.0–10.5)
nRBC: 0 % (ref 0.0–0.2)

## 2018-04-24 LAB — COMPREHENSIVE METABOLIC PANEL
ALBUMIN: 3.9 g/dL (ref 3.5–5.0)
ALT: 20 U/L (ref 0–44)
ANION GAP: 9 (ref 5–15)
AST: 28 U/L (ref 15–41)
Alkaline Phosphatase: 60 U/L (ref 38–126)
BILIRUBIN TOTAL: 0.4 mg/dL (ref 0.3–1.2)
BUN: 11 mg/dL (ref 8–23)
CHLORIDE: 104 mmol/L (ref 98–111)
CO2: 29 mmol/L (ref 22–32)
Calcium: 9.3 mg/dL (ref 8.9–10.3)
Creatinine, Ser: 0.84 mg/dL (ref 0.44–1.00)
GFR calc Af Amer: >60 mL/min (ref >60)
GFR calc non Af Amer: >60 mL/min (ref >60)
GLUCOSE: 80 mg/dL (ref 70–99)
POTASSIUM: 4.2 mmol/L (ref 3.5–5.1)
SODIUM: 142 mmol/L (ref 135–145)
TOTAL PROTEIN: 7.4 g/dL (ref 6.5–8.1)

## 2018-04-24 NOTE — Addendum Note (Signed)
Addended by: Truitt Merle on: 04/24/2018 03:29 PM   Modules accepted: Orders

## 2018-04-27 DIAGNOSIS — C7A012 Malignant carcinoid tumor of the ileum: Secondary | ICD-10-CM | POA: Diagnosis not present

## 2018-04-29 ENCOUNTER — Encounter (HOSPITAL_COMMUNITY): Payer: Self-pay

## 2018-04-29 ENCOUNTER — Ambulatory Visit (HOSPITAL_COMMUNITY)
Admission: RE | Admit: 2018-04-29 | Discharge: 2018-04-29 | Disposition: A | Discharge status: Home / Self Care | Payer: 59 | Source: Ambulatory Visit | Attending: Hematology | Admitting: Hematology

## 2018-04-29 DIAGNOSIS — C7A00 Malignant carcinoid tumor of unspecified site: Secondary | ICD-10-CM | POA: Diagnosis not present

## 2018-04-29 DIAGNOSIS — N200 Calculus of kidney: Secondary | ICD-10-CM | POA: Diagnosis not present

## 2018-04-29 DIAGNOSIS — C787 Secondary malignant neoplasm of liver and intrahepatic bile duct: Secondary | ICD-10-CM | POA: Insufficient documentation

## 2018-04-29 DIAGNOSIS — C786 Secondary malignant neoplasm of retroperitoneum and peritoneum: Secondary | ICD-10-CM | POA: Diagnosis not present

## 2018-04-29 DIAGNOSIS — C7B09 Secondary carcinoid tumors of other sites: Secondary | ICD-10-CM | POA: Diagnosis not present

## 2018-04-29 DIAGNOSIS — I700 Atherosclerosis of aorta: Secondary | ICD-10-CM | POA: Insufficient documentation

## 2018-04-29 MED ORDER — IOHEXOL 300 MG/ML  SOLN
100.0000 mL | Freq: Once | INTRAMUSCULAR | Status: AC | PRN
  Administered 2018-04-29: 100 mL via INTRAVENOUS

## 2018-04-29 MED ORDER — SODIUM CHLORIDE 0.9 % IJ SOLN
INTRAMUSCULAR | Status: AC
  Filled 2018-04-29: qty 50

## 2018-04-30 LAB — 5 HIAA, QUANTITATIVE, URINE, 24 HOUR
5 HIAA UR: 15.8 mg/L
5-HIAA,Quant.,24 Hr Urine: 22.1 mg/24 hr — ABNORMAL HIGH (ref 0.0–14.9)
Total Volume: 1400

## 2018-04-30 LAB — CHROMOGRANIN A: Chromogranin A: 14 nmol/L — ABNORMAL HIGH (ref 0–5)

## 2018-05-11 ENCOUNTER — Inpatient Hospital Stay: Payer: 59

## 2018-05-11 ENCOUNTER — Inpatient Hospital Stay (HOSPITAL_BASED_OUTPATIENT_CLINIC_OR_DEPARTMENT_OTHER): Payer: 59 | Admitting: Hematology

## 2018-05-11 ENCOUNTER — Telehealth: Payer: Self-pay | Admitting: Hematology

## 2018-05-11 ENCOUNTER — Encounter: Payer: Self-pay | Admitting: Hematology

## 2018-05-11 VITALS — BP 156/85 | HR 98 | Temp 97.7°F | Resp 19 | Ht 61.0 in | Wt 143.3 lb

## 2018-05-11 DIAGNOSIS — K760 Fatty (change of) liver, not elsewhere classified: Secondary | ICD-10-CM

## 2018-05-11 DIAGNOSIS — Z79899 Other long term (current) drug therapy: Secondary | ICD-10-CM

## 2018-05-11 DIAGNOSIS — C7B02 Secondary carcinoid tumors of liver: Secondary | ICD-10-CM

## 2018-05-11 DIAGNOSIS — Z87442 Personal history of urinary calculi: Secondary | ICD-10-CM

## 2018-05-11 DIAGNOSIS — C7A012 Malignant carcinoid tumor of the ileum: Secondary | ICD-10-CM

## 2018-05-11 DIAGNOSIS — E876 Hypokalemia: Secondary | ICD-10-CM

## 2018-05-11 DIAGNOSIS — C7B09 Secondary carcinoid tumors of other sites: Secondary | ICD-10-CM

## 2018-05-11 DIAGNOSIS — R002 Palpitations: Secondary | ICD-10-CM

## 2018-05-11 DIAGNOSIS — I700 Atherosclerosis of aorta: Secondary | ICD-10-CM

## 2018-05-11 DIAGNOSIS — N200 Calculus of kidney: Secondary | ICD-10-CM

## 2018-05-11 DIAGNOSIS — K56609 Unspecified intestinal obstruction, unspecified as to partial versus complete obstruction: Secondary | ICD-10-CM

## 2018-05-11 DIAGNOSIS — I10 Essential (primary) hypertension: Secondary | ICD-10-CM

## 2018-05-11 MED ORDER — OCTREOTIDE ACETATE 30 MG IM KIT
30.0000 mg | PACK | Freq: Once | INTRAMUSCULAR | Status: AC
  Administered 2018-05-11: 30 mg via INTRAMUSCULAR

## 2018-05-11 NOTE — Patient Instructions (Signed)

## 2018-05-11 NOTE — Telephone Encounter (Signed)
Scheduled appt per 10/27 - pt is aware of appt date and time. - Message left for RN to let them know patient was added.

## 2018-05-11 NOTE — Telephone Encounter (Signed)
Gave pt avs and calendar  °

## 2018-05-11 NOTE — Progress Notes (Signed)
Vanessa Waller OFFICE PROGRESS NOTE  Patient Care Team: Vanessa Waller, Vanessa Waller as PCP - General (Physician Assistant)   Date of Service:  05/11/2018   DIAGNOSIS: 1. 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  2. 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 The patient underwent surgical resection of her carcinoid tumor on 08/17/2005. Two out of 13 lymph nodes were positive. The patient's stage was III. The patient was noted to have an elevated urinary 5HIAA level in July 2011. A positive octreotide scan was noted coming from the right lower pelvis adjacent to the bladder on 09/06/2010. Initially this was felt to be a uterine fibroid. The patient has continued to have positive octreotide scan on 11/07/2011 and 05/06/2012. CT scans of the abdomen and pelvis were also carried out on 03/02/2010, 02/24/2012 and 06/30/2012. These scans in conjunction with an elevated 5HIAA level of 22.7 on 04/13/2012 and a chromogranin A level which had risen to 18.0 on 03/23/2012 strongly suggested that the patient's disease had recurred. The patient required admission to the hospital from 06/30/2012 through 07/03/2012 for an episode of upper abdominal pain which occurred suddenly the day prior to admission. The patient was started on somatostatin LAR 30 mg IM on 07/07/2012 for recurrent disease involving the lateral segment of the left hepatic lobe, a right jejunal mesenteric node and 2 enhancing masses adjacent to the uterus. Twenty-four-hour urine 5HIAA on 10/19/2012 was 5.5. CT scan of the abdomen and pelvis with IV contrast on 10/19/2012 showed generally stable disease with the exception of a decrease in the right abdominal mesenteric lymphadenopathy. There have been 2 lesions in the liver which are felt to be stable. One  lesion is in the lateral segment of the left hepatic lobe measuring 14 x 14 mm. The second lesion is seen in the lateral segment adjacent to the falciform ligament and measures 9 mm.  2. Uterine fibroids noted on imaging studies dating back to late April 2010.  3. Elevated liver transaminases noted in March 2007. The patient was evaluated by Dr. Delfin Edis in October 2010 and felt that the etiology was hepatic steatosis.  4. Admission to the hospital for upper abdominal pain with sudden onset. Admission dates were 06/30/2012 through 07/03/2012. CT scan of the abdomen and pelvis with IV contrast on 06/30/2012 showed no definite etiology for the pain. It was noted that a lymph node in the right jejunal mesentery had developed central necrosis when compared with the prior study of 02/24/2012. There was no evidence for bowel obstruction. There has previously been no evidence for gallstones on recent CT scans, and an abdominal ultrasound dating back to 05/08/2009.  5. Possible mass involving the left breast on screening mammogram from 06/18/2012. A diagnostic mammogram and ultrasound of the left breast carried out on 07/17/2012 were normal. Followup screening mammogram was suggested in 1 year.  6. Diarrhea with onset in December 2013, improved with treatment and use Imodium as needed.   CURRENT THERAPY:  Sandostatin LAR 30 mg IM every 4 weeks, started on 07/07/2012. Sandostatin LAR increased to 60 mg IM every 4 weeks on 08/27/2017 due to disease progression. Held from5/20/2019 per pt's request, restarted at 30mg  IM every 4 weeks on 05/11/2018   INTERVAL HISTORY:  Vanessa Waller returns for follow up. Her last CT scan revealed slight enlargement of  liver metastases. Today, she is here alone. She states that she feels the same and denies new symptoms. She states that she occasionally feels palpitations. She denies CP or dizziness. She denies RUQ abdominal pain.    REVIEW OF SYSTEMS:   Constitutional:  Denies fevers, chills or abnormal weight loss  Eyes: Denies blurriness of vision Ears, nose, mouth, throat, and face: Denies mucositis or sore throat Respiratory: Denies cough, dyspnea or wheezes Cardiovascular: Denies chest discomfort or lower extremity swelling (+) occasional palpitations Gastrointestinal:  Denies nausea, heartburn. No new BM changes Skin: Denies abnormal skin rashes Lymphatics: Denies new lymphadenopathy or easy bruising Neurological:Denies numbness, tingling or new weaknesses Behavioral/Psych: Mood is stable, no new changes  All other systems were reviewed with the patient and are negative.  MEDICAL HISTORY:  Past Medical History:  Diagnosis Date  . 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 carcinoid tumor dx'd 08/2005  . Diverticulosis   . Hepatic steatosis 02/24/12  . History of kidney stones    x 2   . Hypopotassemia   . 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 ([place])    liver,mesentery,pelvis  . Nonspecific elevation of levels of transaminase or lactic acid dehydrogenase (LDH)   . Small bowel obstruction ([place])   . Uterine fibroid     SURGICAL HISTORY: Past Surgical History:  Procedure Laterality Date  . carcinoid tumor resection  2007   ileum  . colonscopy  not sure  . CYSTOSCOPY/URETEROSCOPY/HOLMIUM LASER/STENT PLACEMENT Right 01/16/2018   Procedure: RIGHT URETEROSCOPY/RIGHT RETROGRADE/HOLMIUM LASER/STENT PLACEMENT;  Surgeon: Ardis Hughs, MD;  Location: [facility];  Service: Urology;  Laterality: Right;  . TUBAL LIGATION      I have reviewed the social history and family history with the patient and they are unchanged from previous note.  ALLERGIES:  is allergic to cephalexin and amoxicillin.  MEDICATIONS:  Current Outpatient Medications  Medication Sig Dispense Refill  . calcium acetate (PHOSLO) 667 MG capsule Take by mouth every evening.     . Cholecalciferol (VITAMIN D-3) 5000 UNITS TABS Take 1 tablet by  mouth every evening.     . loperamide (IMODIUM) 2 MG capsule Take by mouth as needed for diarrhea or loose stools.    . Multiple Vitamin (MULTIVITAMIN) tablet Take 1 tablet by mouth every evening.     [name] octreotide (SANDOSTATIN LAR) 30 MG injection Inject 30 mg into the muscle every 28 (twenty-eight) days.    . potassium chloride SA (K-DUR,KLOR-CON) 20 MEQ tablet TAKE 1 TABLET BY MOUTH  EVERY OTHER DAY (Patient not taking: Taking once or twice weekly) 45 tablet 1   No current facility-administered medications for this visit.     PHYSICAL EXAMINATION:  ECOG PERFORMANCE STATUS: 0 - Asymptomatic  Vitals:   05/11/18 1345  BP: (!) 156/85  Pulse: 98  Resp: 19  Temp: 97.7 F (36.5 C)  SpO2: 97%   Filed Weights   05/11/18 1345  Weight: 143 lb 4.8 oz (65 kg)    GENERAL:alert, no distress and comfortable SKIN: skin color, texture, turgor are normal, no rashes or significant lesions EYES: normal, Conjunctiva are pink and non-injected, sclera clear OROPHARYNX:no exudate, no erythema and lips, buccal mucosa, and tongue normal  NECK: supple, thyroid normal size, non-tender, without nodularity LYMPH:  no palpable lymphadenopathy in the cervical, axillary or inguinal LUNGS: clear to auscultation and percussion with normal breathing effort HEART: regular rate & rhythm and no murmurs and no lower extremity edema ABDOMEN:abdomen soft, non-tender and normal  bowel sounds. No hepatomegaly.  Musculoskeletal:no cyanosis of digits and no clubbing  NEURO: alert & oriented x 3 with fluent speech, no focal motor/sensory deficits  LABORATORY DATA:  I have reviewed the data as listed CBC Latest Ref Rng & Units 04/24/2018 02/24/2018 01/16/2018  WBC 4.0 - 10.5 K/uL 5.3 5.7 -  Hemoglobin 12.0 - 15.0 g/dL 13.9 13.5 15.0  Hematocrit 36.0 - 46.0 % 43.3 41.7 44.0  Platelets 150 - 400 K/uL 291 255 -    CMP Latest Ref Rng & Units 04/24/2018 02/24/2018 01/16/2018  Glucose 70 - 99 mg/dL 80 86 93  BUN 8 - 23 mg/dL  11 12 11   Creatinine 0.44 - 1.00 mg/dL 0.84 0.92 0.90  Sodium 135 - 145 mmol/L 142 140 143  Potassium 3.5 - 5.1 mmol/L 4.2 4.4 4.1  Chloride 98 - 111 mmol/L 104 104 100  CO2 22 - 32 mmol/L 29 28 -  Calcium 8.9 - 10.3 mg/dL 9.3 9.1 -  Total Protein 6.5 - 8.1 g/dL 7.4 7.2 -  Total Bilirubin 0.3 - 1.2 mg/dL 0.4 0.3 -  Alkaline Phos 38 - 126 U/L 60 65 -  AST 15 - 41 U/L 28 27 -  ALT 0 - 44 U/L 20 20 -   Chromogranin A 08/05/16: 5 09/30/16: 5 11/25/16: 3 01/20/17: 5 04/14/17: 5 05/23/17: 4 09/29/17: 4 12/01/17: 7 02/24/2018: 15 04/24/18: 14   5-HIAA 24 urine (mg/24h): 06/17/2016: 5.1 12/23/2016:5.3 05/19/2017: 9.1  02/23/2018: 23 04/27/18: 22.1    RADIOGRAPHIC STUDIES:  04/29/2018 CT CAP IMPRESSION: 1. Slight enlargement in hepatic metastases. Peritoneal metastases are stable. 2.  Aortic atherosclerosis (ICD10-170.0). 3. Possible cholelithiasis. 4. Left renal stone.  PET Scan, 08/20/2017 IMPRESSION: PET scan on 08/20/17 with results of: Intense radiotracer activity associated with multiple hyperdense liver lesions consistent with well differentiated neuroendocrine tumor hepatic metastasis. Multiple small foci peritoneal metastasis within the lower abdomen and pelvis. Large mass associated the RIGHT adnexa and small lesions in LEFT adnexa also with intense radiotracer activity consistent with neuroendocrine tumor metastasis to the adnexa. No evidence of pulmonary metastasis or skeletal metastasis.  CT CAP W Contrast 05/19/17  IMPRESSION: 1. Minimal disease progression. Hypervascular liver lesions, some of which have enlarged minimally. Peritoneal metastasis, with mild progression of the dominant right adnexal mass compared to 06/17/16. Slight enlargement of a mesenteric node. 2. No new sites of disease identified. 3.  Aortic Atherosclerosis (ICD10-I70.0). 4. Hepatic steatosis. 5.  No acute process or evidence of metastatic disease in the chest. 6. Right nephrolithiasis.  CT Chest,  Abdomen w contrast 02/10/2017 IMPRESSION: 1. Multifocal arterial phase liver lesions are stable to slightly progressed in the interval. 2. Stable small peritoneal nodules.  CT CAP W CONTRAST 06/17/16 IMPRESSION: 1. Continued stability of multifocal metastatic disease involving the liver, peritoneum M and bilateral adnexum. No significant disease progression identified. 2. No significant thoracic findings 3. Aortic atherosclerosis 4. Hepatic steatosis.  CT chest, abdomen and pelvis 01/15/2016 IMPRESSION: 1. Continued stability of multifocal metastatic disease involving the liver, peritoneum and bilateral adnexa. No disease progression identified. 2. No significant thoracic findings. 3. Hepatic steatosis. 4.  Aortic Atherosclerosis (ICD10-170.0)   ASSESSMENT & PLAN:   62 y.o. female with  1. Metastatic carcinoid tumor of ileum to liver, peritoneum and adnexa -We previously reviewed the natural history of metastatic carcinoid tumor. She understands this is an incurable disease, and the goal of therapy is disease control and prolong her life. -We also previously reviewed the overall treatment strategy for metastatic carcinoid  tumor, she understands her disease will probably progress at some point, and we'll move on to second or third line treatment, such as underlying Everolimus or chemotherapy. -We previously  discussed the role of liver targeted therapy, and new treatment option 177 Lu-Dotatate in the future if needed  -She has been doing very well clinically, chromogranin A and urine 5 HIAA level are mostly normal. - CT Scan reviewed from 02/10/2017 shows slightly progression in liver. She is asymptomatic, lab results are stable, I recommend continue Sandostatin injection, with close monitoring. We will monitor closer with labs every 2 months and scans every 4 months instead. -We discussed the CT CAP from 05/19/17 shows mild disease progression in her known lesions. No new sites of disease  identified. I compared to her previous scan since 2013, I think that she has had overall disease progression, especially in the liver.   -Donatate PET scan on 08/20/17 with results of: Intense radiotracer activity associated with multiple hyperdense liver lesions consistent with well differentiated neuroendocrine tumor hepatic metastasis. Multiple small foci peritoneal metastasis within the lower abdomen and pelvis. Large mass associated the RIGHT adnexa and small lesions in LEFT adnexa also with intense radiotracer activity consistent with neuroendocrine tumor metastasis to the adnexa. No evidence of pulmonary metastasis or skeletal metastasis. -Pt tried higher dose of metastatic injection, did not feel any difference, and stopped the injection per her request in May 2019. She is off treatment now.  -We again discussed that due to her disease progression, I recommend changing her treatment. We discussed the option of continuing Sandostatin injection dose at 60 mg every 4 weeks, Lutathera every 2 months for 4 treatments, or oral second-line Afinitor. We also discussed other options such as Y-90, sunitinib etc. I discussed the benefit and risks with of each option with patient. I recommend Lutathera. Patient would like to think about it, and still has not made a decision.  -She is clinically doing well, diarrhea actually has improved since she stopped Sandostatin injection.  However she has developed mild flushing episodes a few times a week.  We discussed the consequence of untreated metastatic neuroendocrine tumor, including liver failure, heart disease etc. -she recently saw [facility] Leamon [facility] at [facility] for a second opinion, options were discussed.  -She has been off Sandostatin injection since April 2019, and her last PET scan was in February 2019.   -restaging CT scan from 04/25/2018 revealed slight progression of her hepatic metastases, stable peritoneal metastasis.   I reviewed and discussed with her. She is  clinically doing well, asymptomatic.  -Patient agrees to restart treatment with simvastatin 30mg  injection every 4 weeks today. She wants to hold on Lutathera therapy for now.  -I will repeat scan in 4 months for close f/u.  -f/u 4 months  2. Hypokalemia -Probably related to her diarrhea, K 3.7 on 06/10/16. -She is on potassium 1 tablet every other day, we'll continue. -Her K has been normal lately. She stopped KCL since her diarrhea has resolved   3. HTN -Her blood pressure was elevated. I previously encouraged her to monitor her blood pressure home. She states her BP has been normal at home and other office  -Follow-up with primary care physician.  4. Cancer screening -She is overdue for mammogram, last mammogram in July 2015. -The patient forgot to schedule her breast mammogram. I previously encouraged her to reschedule. -She will continue follow up with her PCP and other specialists.   PLAN -We reviewed her restaging CT scan findings.  -Will  restart  Monthly Sandostatin injection today -f/u in 4 months with CT AP a few days before -labs in 3 months  All questions were answered. The patient knows to call the clinic with any problems, questions or concerns. No barriers to learning was detected.  I spent 20 minutes counseling the patient face to face. The total time spent in the appointment was 25 minutes and more than 50% was on counseling and review of test results  I, Noor Dweik am acting as scribe for Dr. Truitt Merle.  I have reviewed the above documentation for accuracy and completeness, and I agree with the above.   Truitt Merle 05/11/18

## 2018-06-03 ENCOUNTER — Encounter: Payer: Self-pay | Admitting: Hematology

## 2018-06-03 NOTE — Progress Notes (Unsigned)
No authorization required per patient insurance for [place] and spoke with [facility] Reference # for my call is [phone]  Clinicals must be submitted at time claim is submitted.

## 2018-06-08 ENCOUNTER — Inpatient Hospital Stay: Payer: 59 | Attending: Hematology

## 2018-06-08 DIAGNOSIS — Z79899 Other long term (current) drug therapy: Secondary | ICD-10-CM | POA: Insufficient documentation

## 2018-06-08 DIAGNOSIS — C7A012 Malignant carcinoid tumor of the ileum: Secondary | ICD-10-CM | POA: Insufficient documentation

## 2018-06-08 DIAGNOSIS — C7B09 Secondary carcinoid tumors of other sites: Secondary | ICD-10-CM

## 2018-06-08 MED ORDER — OCTREOTIDE ACETATE 30 MG IM KIT
30.0000 mg | PACK | Freq: Once | INTRAMUSCULAR | Status: AC
  Administered 2018-06-08: 30 mg via INTRAMUSCULAR

## 2018-06-08 MED ORDER — OCTREOTIDE ACETATE 30 MG IM KIT
PACK | INTRAMUSCULAR | Status: AC
  Filled 2018-06-08: qty 1

## 2018-06-08 NOTE — Patient Instructions (Signed)

## 2018-06-30 DIAGNOSIS — H43813 Vitreous degeneration, bilateral: Secondary | ICD-10-CM | POA: Diagnosis not present

## 2018-06-30 DIAGNOSIS — H43822 Vitreomacular adhesion, left eye: Secondary | ICD-10-CM | POA: Diagnosis not present

## 2018-07-06 ENCOUNTER — Inpatient Hospital Stay: Payer: 59 | Attending: Hematology

## 2018-07-06 DIAGNOSIS — Z79899 Other long term (current) drug therapy: Secondary | ICD-10-CM | POA: Diagnosis not present

## 2018-07-06 DIAGNOSIS — C7A012 Malignant carcinoid tumor of the ileum: Secondary | ICD-10-CM | POA: Diagnosis not present

## 2018-07-06 DIAGNOSIS — C7B09 Secondary carcinoid tumors of other sites: Secondary | ICD-10-CM

## 2018-07-06 MED ORDER — OCTREOTIDE ACETATE 30 MG IM KIT
30.0000 mg | PACK | Freq: Once | INTRAMUSCULAR | Status: AC
  Administered 2018-07-06: 30 mg via INTRAMUSCULAR

## 2018-07-06 NOTE — Patient Instructions (Signed)

## 2018-07-19 ENCOUNTER — Other Ambulatory Visit: Payer: Self-pay

## 2018-07-19 ENCOUNTER — Encounter (HOSPITAL_COMMUNITY): Payer: Self-pay | Admitting: *Deleted

## 2018-07-19 ENCOUNTER — Emergency Department (HOSPITAL_COMMUNITY)
Admission: EM | Admit: 2018-07-19 | Discharge: 2018-07-19 | Discharge status: Against Medical Advice | Payer: 59 | Attending: Emergency Medicine | Admitting: Emergency Medicine

## 2018-07-19 DIAGNOSIS — Z5321 Procedure and treatment not carried out due to patient leaving prior to being seen by health care provider: Secondary | ICD-10-CM | POA: Diagnosis not present

## 2018-07-19 DIAGNOSIS — Z87442 Personal history of urinary calculi: Secondary | ICD-10-CM | POA: Insufficient documentation

## 2018-07-19 DIAGNOSIS — R109 Unspecified abdominal pain: Secondary | ICD-10-CM | POA: Diagnosis not present

## 2018-07-19 LAB — URINALYSIS, ROUTINE W REFLEX MICROSCOPIC
Bilirubin Urine: NEGATIVE
Glucose, UA: NEGATIVE mg/dL
Ketones, ur: 20 mg/dL — AB
Leukocytes, UA: NEGATIVE
Nitrite: NEGATIVE
PH: 6 (ref 5.0–8.0)
Protein, ur: NEGATIVE mg/dL
SPECIFIC GRAVITY, URINE: 1.011 (ref 1.005–1.030)

## 2018-07-19 NOTE — ED Triage Notes (Signed)
Pt c/o left flank pain since Friday.  Pt has hx of kidney stones.

## 2018-07-19 NOTE — ED Notes (Signed)
Pt told registration that they were leaving.  °

## 2018-07-21 DIAGNOSIS — N201 Calculus of ureter: Secondary | ICD-10-CM | POA: Diagnosis not present

## 2018-08-03 ENCOUNTER — Inpatient Hospital Stay: Payer: 59 | Attending: Hematology

## 2018-08-03 ENCOUNTER — Inpatient Hospital Stay: Payer: 59

## 2018-08-03 DIAGNOSIS — C7B09 Secondary carcinoid tumors of other sites: Secondary | ICD-10-CM

## 2018-08-03 DIAGNOSIS — C7A012 Malignant carcinoid tumor of the ileum: Secondary | ICD-10-CM | POA: Diagnosis present

## 2018-08-03 DIAGNOSIS — Z79899 Other long term (current) drug therapy: Secondary | ICD-10-CM | POA: Diagnosis not present

## 2018-08-03 LAB — CBC WITH DIFFERENTIAL/PLATELET
ABS IMMATURE GRANULOCYTES: 0.01 10*3/uL (ref 0.00–0.07)
BASOS PCT: 1 %
Basophils Absolute: 0.1 10*3/uL (ref 0.0–0.1)
Eosinophils Absolute: 0.1 10*3/uL (ref 0.0–0.5)
Eosinophils Relative: 1 %
HCT: 42 % (ref 36.0–46.0)
Hemoglobin: 13.6 g/dL (ref 12.0–15.0)
Immature Granulocytes: 0 %
Lymphocytes Relative: 38 %
Lymphs Abs: 1.9 10*3/uL (ref 0.7–4.0)
MCH: 29.2 pg (ref 26.0–34.0)
MCHC: 32.4 g/dL (ref 30.0–36.0)
MCV: 90.1 fL (ref 80.0–100.0)
MONO ABS: 0.4 10*3/uL (ref 0.1–1.0)
MONOS PCT: 7 %
NEUTROS ABS: 2.6 10*3/uL (ref 1.7–7.7)
Neutrophils Relative %: 53 %
Platelets: 252 10*3/uL (ref 150–400)
RBC: 4.66 MIL/uL (ref 3.87–5.11)
RDW: 12.6 % (ref 11.5–15.5)
WBC: 5 10*3/uL (ref 4.0–10.5)
nRBC: 0 % (ref 0.0–0.2)

## 2018-08-03 LAB — COMPREHENSIVE METABOLIC PANEL
ALBUMIN: 4 g/dL (ref 3.5–5.0)
ALT: 80 U/L — AB (ref 0–44)
AST: 80 U/L — AB (ref 15–41)
Alkaline Phosphatase: 70 U/L (ref 38–126)
Anion gap: 9 (ref 5–15)
BILIRUBIN TOTAL: 0.7 mg/dL (ref 0.3–1.2)
BUN: 13 mg/dL (ref 8–23)
CO2: 32 mmol/L (ref 22–32)
Calcium: 9.2 mg/dL (ref 8.9–10.3)
Chloride: 104 mmol/L (ref 98–111)
Creatinine, Ser: 0.9 mg/dL (ref 0.44–1.00)
GFR calc Af Amer: >60 mL/min (ref >60)
GFR calc non Af Amer: >60 mL/min (ref >60)
GLUCOSE: 96 mg/dL (ref 70–99)
POTASSIUM: 3.3 mmol/L — AB (ref 3.5–5.1)
Sodium: 145 mmol/L (ref 135–145)
TOTAL PROTEIN: 7.5 g/dL (ref 6.5–8.1)

## 2018-08-03 MED ORDER — OCTREOTIDE ACETATE 30 MG IM KIT
30.0000 mg | PACK | Freq: Once | INTRAMUSCULAR | Status: AC
  Administered 2018-08-03: 30 mg via INTRAMUSCULAR

## 2018-08-03 MED ORDER — OCTREOTIDE ACETATE 30 MG IM KIT
PACK | INTRAMUSCULAR | Status: AC
  Filled 2018-08-03: qty 1

## 2018-08-03 NOTE — Patient Instructions (Signed)

## 2018-08-04 LAB — CHROMOGRANIN A: Chromogranin A (ng/mL): 198.6 ng/mL — ABNORMAL HIGH (ref 0.0–101.8)

## 2018-08-05 ENCOUNTER — Other Ambulatory Visit: Payer: Self-pay | Admitting: Hematology

## 2018-08-07 DIAGNOSIS — L820 Inflamed seborrheic keratosis: Secondary | ICD-10-CM | POA: Diagnosis not present

## 2018-08-07 DIAGNOSIS — L821 Other seborrheic keratosis: Secondary | ICD-10-CM | POA: Diagnosis not present

## 2018-08-07 DIAGNOSIS — D485 Neoplasm of uncertain behavior of skin: Secondary | ICD-10-CM | POA: Diagnosis not present

## 2018-08-07 DIAGNOSIS — D2372 Other benign neoplasm of skin of left lower limb, including hip: Secondary | ICD-10-CM | POA: Diagnosis not present

## 2018-08-24 ENCOUNTER — Encounter (HOSPITAL_COMMUNITY): Payer: Self-pay

## 2018-08-24 ENCOUNTER — Ambulatory Visit (HOSPITAL_COMMUNITY)
Admission: RE | Admit: 2018-08-24 | Discharge: 2018-08-24 | Disposition: A | Discharge status: Home / Self Care | Payer: 59 | Source: Ambulatory Visit | Attending: Hematology | Admitting: Hematology

## 2018-08-24 DIAGNOSIS — C7B09 Secondary carcinoid tumors of other sites: Secondary | ICD-10-CM | POA: Insufficient documentation

## 2018-08-24 DIAGNOSIS — Z79899 Other long term (current) drug therapy: Secondary | ICD-10-CM | POA: Diagnosis not present

## 2018-08-24 DIAGNOSIS — K760 Fatty (change of) liver, not elsewhere classified: Secondary | ICD-10-CM | POA: Diagnosis not present

## 2018-08-24 DIAGNOSIS — C7A012 Malignant carcinoid tumor of the ileum: Secondary | ICD-10-CM | POA: Diagnosis not present

## 2018-08-24 DIAGNOSIS — K769 Liver disease, unspecified: Secondary | ICD-10-CM | POA: Diagnosis not present

## 2018-08-24 MED ORDER — IOHEXOL 300 MG/ML  SOLN
100.0000 mL | Freq: Once | INTRAMUSCULAR | Status: AC | PRN
  Administered 2018-08-24: 100 mL via INTRAVENOUS

## 2018-08-24 MED ORDER — SODIUM CHLORIDE (PF) 0.9 % IJ SOLN
INTRAMUSCULAR | Status: AC
  Filled 2018-08-24: qty 50

## 2018-08-28 NOTE — Progress Notes (Signed)
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     08/24/2018 Imaging    CT AP W Contrast 08/24/18  IMPRESSION: 1. Multiple metastatic lesions are unchanged from the most recent prior CT. There are multiple hypervascular liver lesions as well as omental and peritoneal masses, which show no increase in size or number from the prior CT. No evidence of new metastatic disease. 2. No acute findings. 3. Hepatic steatosis.      Imaging    A positive octreotide scan was noted coming from the right lower pelvis adjacent to the bladder on 09/06/2010. Initially this was felt to be a uterine fibroid. The patient has continued to have positive octreotide scan on 11/07/2011 and 05/06/2012. CT scans of the abdomen and pelvis were also carried out on 03/02/2010, 02/24/2012 and 06/30/2012. These scans in conjunction with an elevated 5HIAA level of 22.7 on 04/13/2012 and a chromogranin A level which had risen to 18.0 on 03/23/2012 strongly suggested that the patient's disease had recurred.     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 Sandostatin LAR increased to 60 mg IM every 4 weeks on 08/27/2017 due to disease progression. Held from 12/01/2017 per pt's request, restarted at 30mg  IM every 4 weeks on 05/11/2018     10/19/2012 Imaging    CT scan of the abdomen and pelvis with IV contrast on 10/19/2012 showed generally stable disease with the exception of a decrease in the  right abdominal mesenteric lymphadenopathy. There have been 2 lesions in the liver which are felt to be stable. One lesion is in the lateral segment of the left hepatic lobe measuring 14 x 14 mm. The second lesion is seen in the lateral segment adjacent to the falciform ligament and measures 9 mm.      08/20/2017 PET scan    Donatate PET  IMPRESSION: 1. Intense radiotracer activity associated with multiple hyperdense liver lesions consistent with well differentiated neuroendocrine tumor hepatic metastasis. 2. Multiple small foci peritoneal metastasis within the lower abdomen and pelvis. 3. Large mass associated the RIGHT adnexa and small lesions in LEFT adnexa also with intense radiotracer activity consistent with neuroendocrine tumor metastasis to the adnexa. 4. No evidence of pulmonary metastasis or skeletal metastasis.    08/24/2018 Imaging    CT AP W Contrast 08/24/18  IMPRESSION: 1. Multiple metastatic lesions are unchanged from the most recent prior CT. There are multiple hypervascular liver lesions as well as omental and peritoneal masses, which show no increase in size or number from the prior CT. No evidence of new metastatic disease. 2. No acute findings. 3. Hepatic steatosis.      CURRENT THERAPY:  Sandostatin LAR 30 mg IM every 4 weeks, started on 07/07/2012. Sandostatin LAR increased to 60 mg IM every 4 weeks on 08/27/2017 due to disease progression. Held from 12/01/2017 per pt's request, restarted at 30mg  IM every 4 weeks on 05/11/2018   INTERVAL HISTORY:  Vanessa  A Waller is here for a follow up and ongoing treatment. She was last seen by me 5 months ago. She presents to the clinic today by herself. She is doing well. Her diarrhea is minor and she has been taking potassium. She notes with Sandostatin injection she her it is hard to find her vein which has resulted in a bruise.     REVIEW OF SYSTEMS:   Constitutional: Denies fevers, chills or abnormal weight loss Eyes:  Denies blurriness of vision Ears, nose, mouth, throat, and face: Denies mucositis or sore throat Respiratory: Denies cough, dyspnea or wheezes Cardiovascular: Denies palpitation, chest discomfort or lower extremity swelling Gastrointestinal:  Denies nausea, heartburn or change in bowel habits Skin: Denies abnormal skin rashes Lymphatics: Denies new lymphadenopathy or easy bruising Neurological:Denies numbness, tingling or new weaknesses Behavioral/Psych: Mood is stable, no new changes  All other systems were reviewed with the patient and are negative.  MEDICAL HISTORY:  Past Medical History:  Diagnosis Date  . 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 carcinoid tumor dx'd 08/2005  . Diverticulosis   . Hepatic steatosis 02/24/12  . History of kidney stones    x 2   . Hypopotassemia   . 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 Nonspecific elevation of levels of transaminase or lactic acid dehydrogenase (LDH)   . Small bowel obstruction ([place])   . Uterine fibroid     SURGICAL HISTORY: Past Surgical History:  Procedure Laterality Date  . carcinoid tumor resection  2007   ileum  . colonscopy  not sure  . CYSTOSCOPY/URETEROSCOPY/HOLMIUM LASER/STENT PLACEMENT Right 01/16/2018   Procedure: RIGHT URETEROSCOPY/RIGHT RETROGRADE/HOLMIUM LASER/STENT PLACEMENT;  Surgeon: Ardis Hughs, MD;  Location: [facility];  Service: Urology;  Laterality: Right;  . TUBAL LIGATION      I have reviewed the social history and family history with the patient and they are unchanged from previous note.  ALLERGIES:  is allergic to cephalexin and amoxicillin.  MEDICATIONS:  Current Outpatient Medications  Medication Sig Dispense Refill  . calcium acetate (PHOSLO) 667 MG capsule Take by mouth every evening.     . Cholecalciferol (VITAMIN D-3) 5000 UNITS TABS Take 1 tablet by mouth every evening.     . loperamide (IMODIUM) 2 MG capsule  Take by mouth as needed for diarrhea or loose stools.    . Multiple Vitamin (MULTIVITAMIN) tablet Take 1 tablet by mouth every evening.     [name] octreotide (SANDOSTATIN LAR) 30 MG injection Inject 30 mg into the muscle every 28 (twenty-eight) days.    . potassium chloride SA (K-DUR,KLOR-CON) 20 MEQ tablet TAKE 1 TABLET BY MOUTH  EVERY OTHER DAY 45 tablet 1   No current facility-administered medications for this visit.     PHYSICAL EXAMINATION: ECOG PERFORMANCE STATUS: 0 - Asymptomatic  Vitals:   08/31/18 1335  BP: (!) 141/86  Pulse: 83  Resp: 18  Temp: 98.3 F (36.8 C)  SpO2: 100%   Filed Weights   08/31/18 1335  Weight: 136 lb 3.2 oz (61.8 kg)    GENERAL:alert, no distress and comfortable SKIN: skin color, texture, turgor are normal, no rashes or significant lesions EYES: normal, Conjunctiva are pink and non-injected, sclera clear OROPHARYNX:no exudate, no erythema and lips, buccal mucosa, and tongue normal  NECK: supple, thyroid normal size, non-tender, without nodularity LYMPH:  no palpable lymphadenopathy in the cervical, axillary or inguinal LUNGS: clear to auscultation and percussion with normal breathing effort HEART:  regular rate & rhythm and no murmurs and no lower extremity edema ABDOMEN:abdomen soft, non-tender and normal bowel sounds Musculoskeletal:no cyanosis of digits and no clubbing  NEURO: alert & oriented x 3 with fluent speech, no focal motor/sensory deficits  LABORATORY DATA:  I have reviewed the data as listed CBC Latest Ref Rng & Units 08/03/2018 04/24/2018 02/24/2018  WBC 4.0 - 10.5 K/uL 5.0 5.3 5.7  Hemoglobin 12.0 - 15.0 g/dL 13.6 13.9 13.5  Hematocrit 36.0 - 46.0 % 42.0 43.3 41.7  Platelets 150 - 400 K/uL 252 291 255     CMP Latest Ref Rng & Units 08/03/2018 04/24/2018 02/24/2018  Glucose 70 - 99 mg/dL 96 80 86  BUN 8 - 23 mg/dL 13 11 12   Creatinine 0.44 - 1.00 mg/dL 0.90 0.84 0.92  Sodium 135 - 145 mmol/L 145 142 140  Potassium 3.5 - 5.1 mmol/L  3.3(L) 4.2 4.4  Chloride 98 - 111 mmol/L 104 104 104  CO2 22 - 32 mmol/L 32 29 28  Calcium 8.9 - 10.3 mg/dL 9.2 9.3 9.1  Total Protein 6.5 - 8.1 g/dL 7.5 7.4 7.2  Total Bilirubin 0.3 - 1.2 mg/dL 0.7 0.4 0.3  Alkaline Phos 38 - 126 U/L 70 60 65  AST 15 - 41 U/L 80(H) 28 27  ALT 0 - 44 U/L 80(H) 20 20      RADIOGRAPHIC STUDIES: I have personally reviewed the radiological images as listed and agreed with the findings in the report. No results found.   ASSESSMENT & PLAN:  ROBERT SUNGA is a 63 y.o. female with   1. 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 She was treated with surgical resection in 2/007.  -She unfortunately has cancer recurrence with metastasis to the liver, peritoneum and adnexa in late 2013. She has been on Sandostatin injections since 07/07/12. Her diarrhea very mild and she has been tolerating well.  -Due to mild disease progression in earlier 2019, I increased her dose of Sandostatin injection dose to 60mg , she came off Sandostatin injection after 10/27/2017, and restart 30mg  dose in 04/2018, she has been tolerating well -We discussed her CT AP from 08/24/18 which shows stable disease with no significant change.  I personally reviewed her scan.  She is clinically doing well.  Continue Sandostatin injection 30mg  IM every 4 weeks -Her last labs in 08/03/18 showed CBC, CMP and 5 HIAA urine WNL except Potassium at 3.3. and AST/ALT slightly elevated. Will monitor in future labs.  -F/u in 12 weeks    2. Hypokalemia -Probably related to her diarrhea -She is on potassium 1 tablet every other day, we'll continue. -Diarrhea mild very mild. She has imodium to take as needed.   3. HTN -Follow-up with primary care physician. -BP at 141/86 today (08/31/18)  4. Cancer screening  -She is overdue for mammogram, last mammogram in July 2015. -She will continue follow up with her PCP  and other specialists.   PLAN -We reviewed her restaging CT scan findings, she has stable disease   -Proceed Sandostatin injection today -Sandostatin injection every 4 weeksX3 -Lab in 8 weeks -F/u in 12 weeks    No problem-specific Assessment & Plan notes found for this encounter.   No orders of the defined types were placed in this encounter.  All questions were answered. The patient knows to call the clinic with any problems, questions or concerns. No barriers to learning was detected. I  spent 20 minutes counseling the patient face to face. The total time spent in the appointment was 25 minutes and more than 50% was on counseling and review of test results     Truitt Merle, MD 08/31/2018   I, Joslyn Devon, am acting as scribe for Truitt Merle, MD.   I have reviewed the above documentation for accuracy and completeness, and I agree with the above.

## 2018-08-29 LAB — 5 HIAA, QUANTITATIVE, URINE, 24 HOUR
5-HIAA, Ur: 6.5 mg/L
5-HIAA,Quant.,24 Hr Urine: 10.7 mg/24 hr (ref 0.0–14.9)
Total Volume: 1650

## 2018-08-31 ENCOUNTER — Encounter: Payer: Self-pay | Admitting: Hematology

## 2018-08-31 ENCOUNTER — Inpatient Hospital Stay: Payer: 59 | Attending: Hematology

## 2018-08-31 ENCOUNTER — Inpatient Hospital Stay: Payer: 59 | Admitting: Hematology

## 2018-08-31 VITALS — BP 141/86 | HR 83 | Temp 98.3°F | Resp 18 | Ht 61.0 in | Wt 136.2 lb

## 2018-08-31 VITALS — BP 151/92 | HR 82 | Temp 98.1°F | Resp 18

## 2018-08-31 DIAGNOSIS — I10 Essential (primary) hypertension: Secondary | ICD-10-CM | POA: Insufficient documentation

## 2018-08-31 DIAGNOSIS — C7A012 Malignant carcinoid tumor of the ileum: Secondary | ICD-10-CM | POA: Diagnosis not present

## 2018-08-31 DIAGNOSIS — Z79899 Other long term (current) drug therapy: Secondary | ICD-10-CM | POA: Diagnosis not present

## 2018-08-31 DIAGNOSIS — K56609 Unspecified intestinal obstruction, unspecified as to partial versus complete obstruction: Secondary | ICD-10-CM

## 2018-08-31 DIAGNOSIS — Z87442 Personal history of urinary calculi: Secondary | ICD-10-CM | POA: Diagnosis not present

## 2018-08-31 DIAGNOSIS — C7B09 Secondary carcinoid tumors of other sites: Secondary | ICD-10-CM | POA: Insufficient documentation

## 2018-08-31 DIAGNOSIS — K760 Fatty (change of) liver, not elsewhere classified: Secondary | ICD-10-CM | POA: Insufficient documentation

## 2018-08-31 DIAGNOSIS — E876 Hypokalemia: Secondary | ICD-10-CM

## 2018-08-31 MED ORDER — OCTREOTIDE ACETATE 30 MG IM KIT
PACK | INTRAMUSCULAR | Status: AC
  Filled 2018-08-31: qty 1

## 2018-08-31 MED ORDER — OCTREOTIDE ACETATE 30 MG IM KIT
30.0000 mg | PACK | Freq: Once | INTRAMUSCULAR | Status: AC
  Administered 2018-08-31: 30 mg via INTRAMUSCULAR

## 2018-08-31 NOTE — Patient Instructions (Signed)

## 2018-09-28 ENCOUNTER — Other Ambulatory Visit: Payer: Self-pay

## 2018-09-28 ENCOUNTER — Inpatient Hospital Stay: Payer: 59 | Attending: Hematology

## 2018-09-28 DIAGNOSIS — C7A012 Malignant carcinoid tumor of the ileum: Secondary | ICD-10-CM | POA: Diagnosis not present

## 2018-09-28 DIAGNOSIS — Z79899 Other long term (current) drug therapy: Secondary | ICD-10-CM | POA: Insufficient documentation

## 2018-09-28 DIAGNOSIS — C7B09 Secondary carcinoid tumors of other sites: Secondary | ICD-10-CM

## 2018-09-28 MED ORDER — OCTREOTIDE ACETATE 30 MG IM KIT
30.0000 mg | PACK | Freq: Once | INTRAMUSCULAR | Status: AC
  Administered 2018-09-28: 30 mg via INTRAMUSCULAR

## 2018-09-28 MED ORDER — OCTREOTIDE ACETATE 30 MG IM KIT
PACK | INTRAMUSCULAR | Status: AC
  Filled 2018-09-28: qty 1

## 2018-10-26 ENCOUNTER — Inpatient Hospital Stay: Payer: 59

## 2018-10-26 ENCOUNTER — Inpatient Hospital Stay: Payer: 59 | Attending: Hematology

## 2018-10-26 ENCOUNTER — Other Ambulatory Visit: Payer: Self-pay

## 2018-10-26 VITALS — BP 155/86 | HR 83 | Temp 99.7°F | Resp 18

## 2018-10-26 DIAGNOSIS — C7B09 Secondary carcinoid tumors of other sites: Secondary | ICD-10-CM | POA: Insufficient documentation

## 2018-10-26 DIAGNOSIS — C7A012 Malignant carcinoid tumor of the ileum: Secondary | ICD-10-CM | POA: Diagnosis not present

## 2018-10-26 LAB — COMPREHENSIVE METABOLIC PANEL
ALT: 41 U/L (ref 0–44)
AST: 45 U/L — ABNORMAL HIGH (ref 15–41)
Albumin: 3.8 g/dL (ref 3.5–5.0)
Alkaline Phosphatase: 73 U/L (ref 38–126)
Anion gap: 10 (ref 5–15)
BUN: 14 mg/dL (ref 8–23)
CO2: 26 mmol/L (ref 22–32)
Calcium: 8.8 mg/dL — ABNORMAL LOW (ref 8.9–10.3)
Chloride: 107 mmol/L (ref 98–111)
Creatinine, Ser: 0.85 mg/dL (ref 0.44–1.00)
GFR calc Af Amer: >60 mL/min (ref >60)
GFR calc non Af Amer: >60 mL/min (ref >60)
Glucose, Bld: 88 mg/dL (ref 70–99)
Potassium: 3.7 mmol/L (ref 3.5–5.1)
Sodium: 143 mmol/L (ref 135–145)
Total Bilirubin: 0.5 mg/dL (ref 0.3–1.2)
Total Protein: 7.5 g/dL (ref 6.5–8.1)

## 2018-10-26 LAB — CBC WITH DIFFERENTIAL/PLATELET
Abs Immature Granulocytes: 0.01 10*3/uL (ref 0.00–0.07)
Basophils Absolute: 0.1 10*3/uL (ref 0.0–0.1)
Basophils Relative: 1 %
Eosinophils Absolute: 0 10*3/uL (ref 0.0–0.5)
Eosinophils Relative: 1 %
HCT: 43.9 % (ref 36.0–46.0)
Hemoglobin: 13.8 g/dL (ref 12.0–15.0)
Immature Granulocytes: 0 %
Lymphocytes Relative: 39 %
Lymphs Abs: 2.1 10*3/uL (ref 0.7–4.0)
MCH: 29.1 pg (ref 26.0–34.0)
MCHC: 31.4 g/dL (ref 30.0–36.0)
MCV: 92.4 fL (ref 80.0–100.0)
Monocytes Absolute: 0.4 10*3/uL (ref 0.1–1.0)
Monocytes Relative: 7 %
Neutro Abs: 2.8 10*3/uL (ref 1.7–7.7)
Neutrophils Relative %: 52 %
Platelets: 261 10*3/uL (ref 150–400)
RBC: 4.75 MIL/uL (ref 3.87–5.11)
RDW: 12.8 % (ref 11.5–15.5)
WBC: 5.5 10*3/uL (ref 4.0–10.5)
nRBC: 0 % (ref 0.0–0.2)

## 2018-10-26 MED ORDER — OCTREOTIDE ACETATE 30 MG IM KIT
PACK | INTRAMUSCULAR | Status: AC
  Filled 2018-10-26: qty 1

## 2018-10-26 MED ORDER — OCTREOTIDE ACETATE 30 MG IM KIT
30.0000 mg | PACK | Freq: Once | INTRAMUSCULAR | Status: AC
  Administered 2018-10-26: 30 mg via INTRAMUSCULAR

## 2018-10-26 NOTE — Patient Instructions (Signed)

## 2018-10-28 LAB — CHROMOGRANIN A: Chromogranin A (ng/mL): 202.1 ng/mL — ABNORMAL HIGH (ref 0.0–101.8)

## 2018-10-29 ENCOUNTER — Telehealth: Payer: Self-pay

## 2018-10-29 NOTE — Telephone Encounter (Signed)
Spoke with patient regarding lab results, per Dr. Burr Medico, CBC and CMP unremarkable, Chromogranin level is mildly elevated, no concerns.  Patient verbalized an understanding.

## 2018-10-29 NOTE — Telephone Encounter (Signed)
-----   Message from Truitt Merle, MD sent at 10/29/2018  7:37 AM EDT ----- Please let pt know her lab results, Chromogranin level mildly elevated,stable, CBC, CMP unremarkable,  no concerns, thanks   Truitt Merle  10/29/2018

## 2018-11-20 NOTE — Progress Notes (Addendum)
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     08/24/2018 Imaging    CT AP W Contrast 08/24/18  IMPRESSION: 1. Multiple metastatic lesions are unchanged from the most recent prior CT. There are multiple hypervascular liver lesions as well as omental and peritoneal masses, which show no increase in size or number from the prior CT. No evidence of new metastatic disease. 2. No acute findings. 3. Hepatic steatosis.      Imaging    A positive octreotide scan was noted coming from the right lower pelvis adjacent to the bladder on 09/06/2010. Initially this was felt to be a uterine fibroid. The patient has continued to have positive octreotide scan on 11/07/2011 and 05/06/2012. CT scans of the abdomen and pelvis were also carried out on 03/02/2010, 02/24/2012 and 06/30/2012. These scans in conjunction with an elevated 5HIAA level of 22.7 on 04/13/2012 and a chromogranin A level which had risen to 18.0 on 03/23/2012 strongly suggested that the patient's disease had recurred.     2013 Initial Diagnosis    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 [facility])    07/07/2012 -  Antibody Plan    Sandostatin LAR 30 mg IM every 4 weeks, started on 07/07/2012. Sandostatin LAR increased to 60 mg IM every 4 weeks on 08/27/2017 due to disease progression. Held from 12/01/2017 per pt's request, restarted at 30mg  IM every 4 weeks on 05/11/2018     10/19/2012 Imaging    CT scan of the abdomen and pelvis with IV contrast on 10/19/2012 showed generally stable disease with the exception of a decrease in the  right abdominal mesenteric lymphadenopathy. There have been 2 lesions in the liver which are felt to be stable. One lesion is in the lateral segment of the left hepatic lobe measuring 14 x 14 mm. The second lesion is seen in the lateral segment adjacent to the falciform ligament and measures 9 mm.      08/20/2017 PET scan    Donatate PET  IMPRESSION: 1. Intense radiotracer activity associated with multiple hyperdense liver lesions consistent with well differentiated neuroendocrine tumor hepatic metastasis. 2. Multiple small foci peritoneal metastasis within the lower abdomen and pelvis. 3. Large mass associated the RIGHT adnexa and small lesions in LEFT adnexa also with intense radiotracer activity consistent with neuroendocrine tumor metastasis to the adnexa. 4. No evidence of pulmonary metastasis or skeletal metastasis.    08/24/2018 Imaging    CT AP W Contrast 08/24/18  IMPRESSION: 1. Multiple metastatic lesions are unchanged from the most recent prior CT. There are multiple hypervascular liver lesions as well as omental and peritoneal masses, which show no increase in size or number from the prior CT. No evidence of new metastatic disease. 2. No acute findings. 3. Hepatic steatosis.      CURRENT THERAPY:  Sandostatin LAR 30 mg IM every 4 weeks, started on 07/07/2012. Sandostatin LAR increased to 60 mg IM every 4 weeks on 08/27/2017 due to disease progression. Held from 12/01/2017 per pt's request, restarted at 30mg  IM every 4 weeks on 05/11/2018  INTERVAL HISTORY:  Vanessa A  Waller is here for a follow up and ongoing treatment. She presents to the clinic today by herself. She notes she is doing well. She notes her hair is thinning on scalp mainly and she feels this is related to Sandostatin. She notes she does take multivitamin. She feels her voice is hoarse occasionally and feels her voice has changed. She also feels her hair loss and change is voice can be related to her Thyroid  function. She does not have a recent TSH with her PCP office. She also notes occasional feeling weak and shaky when she feels she needs to eat. She denies any pain.     REVIEW OF SYSTEMS:   Constitutional: Denies fevers, chills or abnormal weight loss Eyes: Denies blurriness of vision Ears, nose, mouth, throat, and face: Denies mucositis or sore throat (+) change in voice, hoarseness  Respiratory: Denies cough, dyspnea or wheezes Cardiovascular: Denies palpitation, chest discomfort or lower extremity swelling Gastrointestinal:  Denies nausea, heartburn or change in bowel habits Skin: Denies abnormal skin rashes (+) hair thinning Lymphatics: Denies new lymphadenopathy or easy bruising Neurological:Denies numbness, tingling or new weaknesses Behavioral/Psych: Mood is stable, no new changes  All other systems were reviewed with the patient and are negative.  MEDICAL HISTORY:  Past Medical History:  Diagnosis Date  . 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 carcinoid tumor dx'd 08/2005  . Diverticulosis   . Hepatic steatosis 02/24/12  . History of kidney stones    x 2   . Hypopotassemia   . 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 ([place])    liver,mesentery,pelvis  . Nonspecific elevation of levels of transaminase or lactic acid dehydrogenase (LDH)   . Small bowel obstruction ([place])   . Uterine fibroid     SURGICAL HISTORY: Past Surgical History:  Procedure Laterality Date  . carcinoid tumor resection  2007   ileum  . colonscopy  not sure  . CYSTOSCOPY/URETEROSCOPY/HOLMIUM LASER/STENT PLACEMENT Right 01/16/2018   Procedure: RIGHT URETEROSCOPY/RIGHT RETROGRADE/HOLMIUM LASER/STENT PLACEMENT;  Surgeon: Ardis Hughs, MD;  Location: [facility];  Service: Urology;  Laterality: Right;  . TUBAL LIGATION      I have reviewed the social history and family history with the patient and they are unchanged from previous note.  ALLERGIES:  is allergic to  cephalexin and amoxicillin.  MEDICATIONS:  Current Outpatient Medications  Medication Sig Dispense Refill  . calcium acetate (PHOSLO) 667 MG capsule Take by mouth every evening.     . Cholecalciferol (VITAMIN D-3) 5000 UNITS TABS Take 1 tablet by mouth every evening.     . loperamide (IMODIUM) 2 MG capsule Take by mouth as needed for diarrhea or loose stools.    . Multiple Vitamin (MULTIVITAMIN) tablet Take 1 tablet by mouth every evening.     [name] octreotide (SANDOSTATIN LAR) 30 MG injection Inject 30 mg into the muscle every 28 (twenty-eight) days.    . potassium chloride SA (K-DUR,KLOR-CON) 20 MEQ tablet TAKE 1 TABLET BY MOUTH  EVERY OTHER DAY 45 tablet 1  . pyridOXINE (B-6) 50 MG tablet Take 50 mg by mouth daily.     No current facility-administered medications for this visit.     PHYSICAL EXAMINATION: ECOG PERFORMANCE STATUS: 1 - Symptomatic but completely ambulatory  Vitals:   11/23/18 0844  BP: (!) 142/96  Pulse: 79  Resp: 18  Temp: 98.5 F (36.9 C)  SpO2: 100%   Filed Weights   11/23/18 0844  Weight: 134 lb 11.2 oz (61.1  kg)    GENERAL:alert, no distress and comfortable SKIN: skin color, texture, turgor are normal, no rashes or significant lesions EYES: normal, Conjunctiva are pink and non-injected, sclera clear OROPHARYNX:no exudate, no erythema and lips, buccal mucosa, and tongue normal  NECK: supple, thyroid normal size, non-tender, without nodularity LYMPH:  no palpable lymphadenopathy in the cervical, axillary or inguinal LUNGS: clear to auscultation and percussion with normal breathing effort HEART: regular rate & rhythm and no murmurs and no lower extremity edema ABDOMEN:abdomen soft, non-tender and normal bowel sounds Musculoskeletal:no cyanosis of digits and no clubbing  NEURO: alert & oriented x 3 with fluent speech, no focal motor/sensory deficits  LABORATORY DATA:  I have reviewed the data as listed CBC Latest Ref Rng & Units 10/26/2018 08/03/2018  04/24/2018  WBC 4.0 - 10.5 K/uL 5.5 5.0 5.3  Hemoglobin 12.0 - 15.0 g/dL 13.8 13.6 13.9  Hematocrit 36.0 - 46.0 % 43.9 42.0 43.3  Platelets 150 - 400 K/uL 261 252 291     CMP Latest Ref Rng & Units 10/26/2018 08/03/2018 04/24/2018  Glucose 70 - 99 mg/dL 88 96 80  BUN 8 - 23 mg/dL 14 13 11   Creatinine 0.44 - 1.00 mg/dL 0.85 0.90 0.84  Sodium 135 - 145 mmol/L 143 145 142  Potassium 3.5 - 5.1 mmol/L 3.7 3.3(L) 4.2  Chloride 98 - 111 mmol/L 107 104 104  CO2 22 - 32 mmol/L 26 32 29  Calcium 8.9 - 10.3 mg/dL 8.8(L) 9.2 9.3  Total Protein 6.5 - 8.1 g/dL 7.5 7.5 7.4  Total Bilirubin 0.3 - 1.2 mg/dL 0.5 0.7 0.4  Alkaline Phos 38 - 126 U/L 73 70 60  AST 15 - 41 U/L 45(H) 80(H) 28  ALT 0 - 44 U/L 41 80(H) 20      RADIOGRAPHIC STUDIES: I have personally reviewed the radiological images as listed and agreed with the findings in the report. No results found.   ASSESSMENT & PLAN:  Vanessa Waller is a 63 y.o. female with   1. 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 She was treated with surgical resection in 2/007.  -She unfortunately has cancer recurrence with metastasis to the liver, peritoneum and adnexa in late 2013. She has been on Sandostatin injections since 07/07/12. Her diarrhea very mild and she has been tolerating well.  -Due to mild disease progression in earlier 2019, I increased her dose of Sandostatin injection dose to 60mg , she came off Sandostatin injection after 10/27/2017 per her request, and restart 30mg  dose in 04/2018, she has been tolerating well.  -She is overall clinically doing well and stable. She has been having mild hair loss and episodes of dizziness and hungry feeling at hone sometime, which she contributes to Sandostatin.  Labs from last month show CBC and CMP WNL, except Ca 8.8., AST 45. She will continue oral potassium as needed, and I encouraged her to take OTC  Vitamin D and Calcium. Her Chromogranin A at 202.1. Her physical exam today was unremarkable.  -Will proceed with Sandostatin injection today and continue monthly  -I answered all her questions to her understanding and satisfaction. If she feels her BG is lower than her usual she should eat a candy bar or juice. -will continue lab every 8 weeks for close f/u  -F/u in 12 weeks with CT scan    2. Hypokalemia -Probably related to her diarrhea -She is on potassium 1 tablet twice  a week now.  -Diarrhea mild very mild stable. She has imodium to take as needed.  -Potasium has normalized to 3.7 (10/26/18)  3. HTN -Follow-up with primary care physician. -BP at 142/96 today (11/23/18)  4. Cancer screening  -She is overdue for mammogram, last mammogram in July 2015. -She will continue follow up with her PCP and other specialists.   5. Mild hair loss and voice change  -She is concerned these symptoms are ongoing and may be related to Sandostatin injections. Will monitor.  -I will check her Thyroid function. I also discussed this can be related to being post-menopausal.  -I encouraged her to take the vitamin Biotin. She can also continue multivitamin.    PLAN -Will proceed with Sandostatin injection today and continue every 4 weeks  -lab in 4 weeks  -F/u in 12 weeks with lab and CT abd/pel a few days before   No problem-specific Assessment & Plan notes found for this encounter.   No orders of the defined types were placed in this encounter.  All questions were answered. The patient knows to call the clinic with any problems, questions or concerns. No barriers to learning was detected. I spent 20 minutes counseling the patient face to face. The total time spent in the appointment was 25 minutes and more than 50% was on counseling and review of test results     Truitt Merle, MD 11/23/2018   I, Joslyn Devon, am acting as scribe for Truitt Merle, MD.   I have reviewed the above documentation  for accuracy and completeness, and I agree with the above.

## 2018-11-23 ENCOUNTER — Ambulatory Visit: Payer: 59

## 2018-11-23 ENCOUNTER — Telehealth: Payer: Self-pay | Admitting: Hematology

## 2018-11-23 ENCOUNTER — Inpatient Hospital Stay: Payer: 59

## 2018-11-23 ENCOUNTER — Inpatient Hospital Stay: Payer: 59 | Attending: Hematology | Admitting: Hematology

## 2018-11-23 ENCOUNTER — Other Ambulatory Visit: Payer: Self-pay

## 2018-11-23 ENCOUNTER — Encounter: Payer: Self-pay | Admitting: Hematology

## 2018-11-23 VITALS — BP 142/96 | HR 79 | Temp 98.5°F | Resp 18 | Ht 61.0 in | Wt 134.7 lb

## 2018-11-23 DIAGNOSIS — K760 Fatty (change of) liver, not elsewhere classified: Secondary | ICD-10-CM | POA: Diagnosis not present

## 2018-11-23 DIAGNOSIS — Z8719 Personal history of other diseases of the digestive system: Secondary | ICD-10-CM | POA: Diagnosis not present

## 2018-11-23 DIAGNOSIS — R197 Diarrhea, unspecified: Secondary | ICD-10-CM | POA: Diagnosis not present

## 2018-11-23 DIAGNOSIS — C7A012 Malignant carcinoid tumor of the ileum: Secondary | ICD-10-CM

## 2018-11-23 DIAGNOSIS — I10 Essential (primary) hypertension: Secondary | ICD-10-CM | POA: Insufficient documentation

## 2018-11-23 DIAGNOSIS — Z79899 Other long term (current) drug therapy: Secondary | ICD-10-CM | POA: Diagnosis not present

## 2018-11-23 DIAGNOSIS — R490 Dysphonia: Secondary | ICD-10-CM | POA: Diagnosis not present

## 2018-11-23 DIAGNOSIS — E876 Hypokalemia: Secondary | ICD-10-CM | POA: Insufficient documentation

## 2018-11-23 DIAGNOSIS — C7B09 Secondary carcinoid tumors of other sites: Secondary | ICD-10-CM

## 2018-11-23 DIAGNOSIS — C7B02 Secondary carcinoid tumors of liver: Secondary | ICD-10-CM | POA: Insufficient documentation

## 2018-11-23 DIAGNOSIS — Z87442 Personal history of urinary calculi: Secondary | ICD-10-CM | POA: Insufficient documentation

## 2018-11-23 MED ORDER — OCTREOTIDE ACETATE 30 MG IM KIT
PACK | INTRAMUSCULAR | Status: AC
  Filled 2018-11-23: qty 1

## 2018-11-23 MED ORDER — OCTREOTIDE ACETATE 30 MG IM KIT
30.0000 mg | PACK | Freq: Once | INTRAMUSCULAR | Status: AC
  Administered 2018-11-23: 30 mg via INTRAMUSCULAR

## 2018-11-23 NOTE — Patient Instructions (Signed)

## 2018-11-23 NOTE — Telephone Encounter (Signed)
Scheduled appt per 5/11 los. ° °A calendar will be mailed out. °

## 2018-12-02 ENCOUNTER — Other Ambulatory Visit: Payer: Self-pay | Admitting: Hematology

## 2018-12-02 DIAGNOSIS — C7B09 Secondary carcinoid tumors of other sites: Secondary | ICD-10-CM

## 2018-12-03 ENCOUNTER — Other Ambulatory Visit: Payer: Self-pay

## 2018-12-03 DIAGNOSIS — L659 Nonscarring hair loss, unspecified: Secondary | ICD-10-CM

## 2018-12-04 ENCOUNTER — Inpatient Hospital Stay: Payer: 59

## 2018-12-04 ENCOUNTER — Other Ambulatory Visit: Payer: Self-pay

## 2018-12-04 DIAGNOSIS — C7B09 Secondary carcinoid tumors of other sites: Secondary | ICD-10-CM

## 2018-12-04 DIAGNOSIS — C7A012 Malignant carcinoid tumor of the ileum: Secondary | ICD-10-CM | POA: Diagnosis not present

## 2018-12-04 DIAGNOSIS — L659 Nonscarring hair loss, unspecified: Secondary | ICD-10-CM

## 2018-12-04 LAB — CBC WITH DIFFERENTIAL/PLATELET
Abs Immature Granulocytes: 0.01 10*3/uL (ref 0.00–0.07)
Basophils Absolute: 0.1 10*3/uL (ref 0.0–0.1)
Basophils Relative: 1 %
Eosinophils Absolute: 0 10*3/uL (ref 0.0–0.5)
Eosinophils Relative: 1 %
HCT: 43.5 % (ref 36.0–46.0)
Hemoglobin: 13.6 g/dL (ref 12.0–15.0)
Immature Granulocytes: 0 %
Lymphocytes Relative: 35 %
Lymphs Abs: 2.1 10*3/uL (ref 0.7–4.0)
MCH: 29.1 pg (ref 26.0–34.0)
MCHC: 31.3 g/dL (ref 30.0–36.0)
MCV: 92.9 fL (ref 80.0–100.0)
Monocytes Absolute: 0.4 10*3/uL (ref 0.1–1.0)
Monocytes Relative: 6 %
Neutro Abs: 3.5 10*3/uL (ref 1.7–7.7)
Neutrophils Relative %: 57 %
Platelets: 272 10*3/uL (ref 150–400)
RBC: 4.68 MIL/uL (ref 3.87–5.11)
RDW: 12.8 % (ref 11.5–15.5)
WBC: 6.1 10*3/uL (ref 4.0–10.5)
nRBC: 0 % (ref 0.0–0.2)

## 2018-12-04 LAB — COMPREHENSIVE METABOLIC PANEL
ALT: 43 U/L (ref 0–44)
AST: 64 U/L — ABNORMAL HIGH (ref 15–41)
Albumin: 4.1 g/dL (ref 3.5–5.0)
Alkaline Phosphatase: 73 U/L (ref 38–126)
Anion gap: 10 (ref 5–15)
BUN: 16 mg/dL (ref 8–23)
CO2: 24 mmol/L (ref 22–32)
Calcium: 9 mg/dL (ref 8.9–10.3)
Chloride: 107 mmol/L (ref 98–111)
Creatinine, Ser: 0.97 mg/dL (ref 0.44–1.00)
GFR calc Af Amer: >60 mL/min (ref >60)
GFR calc non Af Amer: >60 mL/min (ref >60)
Glucose, Bld: 128 mg/dL — ABNORMAL HIGH (ref 70–99)
Potassium: 3.6 mmol/L (ref 3.5–5.1)
Sodium: 141 mmol/L (ref 135–145)
Total Bilirubin: 0.3 mg/dL (ref 0.3–1.2)
Total Protein: 7.3 g/dL (ref 6.5–8.1)

## 2018-12-08 LAB — TSH: TSH: 1.806 u[IU]/mL (ref 0.308–3.960)

## 2018-12-08 LAB — CHROMOGRANIN A: Chromogranin A (ng/mL): 226.3 ng/mL — ABNORMAL HIGH (ref 0.0–101.8)

## 2018-12-09 ENCOUNTER — Telehealth: Payer: Self-pay

## 2018-12-09 NOTE — Telephone Encounter (Signed)
Patient just had labs done on 5/22, she is scheduled again on 6/8 for labs and injection. She wants to know if she needs to get labs again on 6/8?

## 2018-12-09 NOTE — Telephone Encounter (Signed)
-----   Message from Truitt Merle, MD sent at 12/09/2018  7:23 AM EDT ----- Please let pt know her lab results, Chromogranin slightly higher than before, no big concerns, TSH normal.  thanks   Truitt Merle  12/09/2018

## 2018-12-09 NOTE — Telephone Encounter (Signed)
Spoke with patient regarding lab results, chromogranin slightly high, no concerns, TSH normal.  Patient verbalized an understanding.

## 2018-12-11 ENCOUNTER — Telehealth: Payer: Self-pay | Admitting: Hematology

## 2018-12-11 NOTE — Telephone Encounter (Signed)
Per 5/27 schedule message cancelled 6/8 lab. Injection appointment remains as scheduled. Left message for patient.

## 2018-12-21 ENCOUNTER — Inpatient Hospital Stay: Payer: 59 | Attending: Hematology

## 2018-12-21 ENCOUNTER — Other Ambulatory Visit: Payer: Self-pay

## 2018-12-21 ENCOUNTER — Other Ambulatory Visit: Payer: 59

## 2018-12-21 VITALS — BP 142/82 | HR 78 | Temp 98.5°F | Resp 18

## 2018-12-21 DIAGNOSIS — Z79899 Other long term (current) drug therapy: Secondary | ICD-10-CM | POA: Diagnosis not present

## 2018-12-21 DIAGNOSIS — C7A012 Malignant carcinoid tumor of the ileum: Secondary | ICD-10-CM | POA: Insufficient documentation

## 2018-12-21 DIAGNOSIS — C7B09 Secondary carcinoid tumors of other sites: Secondary | ICD-10-CM

## 2018-12-21 MED ORDER — OCTREOTIDE ACETATE 30 MG IM KIT
PACK | INTRAMUSCULAR | Status: AC
  Filled 2018-12-21: qty 1

## 2018-12-21 MED ORDER — OCTREOTIDE ACETATE 30 MG IM KIT
30.0000 mg | PACK | Freq: Once | INTRAMUSCULAR | Status: AC
  Administered 2018-12-21: 30 mg via INTRAMUSCULAR

## 2018-12-21 NOTE — Patient Instructions (Signed)

## 2018-12-28 ENCOUNTER — Ambulatory Visit: Payer: 59

## 2018-12-29 ENCOUNTER — Other Ambulatory Visit: Payer: Self-pay | Admitting: Hematology

## 2018-12-29 ENCOUNTER — Telehealth: Payer: Self-pay | Admitting: *Deleted

## 2018-12-29 DIAGNOSIS — C7B09 Secondary carcinoid tumors of other sites: Secondary | ICD-10-CM

## 2018-12-29 NOTE — Telephone Encounter (Signed)
OK, I will order B12 level and TSH (if not recently checked) on her next lab, let her know, thanks   Truitt Merle MD

## 2018-12-29 NOTE — Telephone Encounter (Signed)
Spoke with pt and informed her of Dr. Ernestina Penna instructions below.  Noted  TSH level was done 12/04/18.  Pt stated she had started taking  Biotin as recommended by Dr. Burr Medico at last visit.  Pt was not sure if Biotin could interfere with TSH results.  Pt will check whether she had started Biotin before TSH level was drawn in May, and will let Dr. Ernestina Penna nurse know about rechecking TSH.

## 2018-12-29 NOTE — Telephone Encounter (Signed)
Pt called stating that AVS from last Octreotide injection 12/21/18 showed information about Octreotide could decrease B12 level.   Pt is very concerned.  Pt wanted to know if she could have lab rechecked for any vitamins deficiency cause by the injection med. Pt's    Phone     [phone].

## 2019-01-18 ENCOUNTER — Other Ambulatory Visit: Payer: Self-pay

## 2019-01-18 ENCOUNTER — Inpatient Hospital Stay: Payer: 59 | Attending: Hematology

## 2019-01-18 VITALS — BP 137/76 | HR 80 | Temp 98.7°F | Resp 18

## 2019-01-18 DIAGNOSIS — Z79899 Other long term (current) drug therapy: Secondary | ICD-10-CM | POA: Insufficient documentation

## 2019-01-18 DIAGNOSIS — C7A012 Malignant carcinoid tumor of the ileum: Secondary | ICD-10-CM | POA: Diagnosis not present

## 2019-01-18 DIAGNOSIS — C7B09 Secondary carcinoid tumors of other sites: Secondary | ICD-10-CM

## 2019-01-18 MED ORDER — OCTREOTIDE ACETATE 30 MG IM KIT
30.0000 mg | PACK | Freq: Once | INTRAMUSCULAR | Status: AC
  Administered 2019-01-18: 30 mg via INTRAMUSCULAR

## 2019-01-18 MED ORDER — OCTREOTIDE ACETATE 30 MG IM KIT
PACK | INTRAMUSCULAR | Status: AC
  Filled 2019-01-18: qty 1

## 2019-01-18 NOTE — Patient Instructions (Signed)
Octreotide injection solution °What is this medicine? °OCTREOTIDE (ok TREE oh tide) is used to reduce blood levels of growth hormone in patients with a condition called acromegaly. This medicine also reduces flushing and watery diarrhea caused by certain types of cancer. °This medicine may be used for other purposes; ask your health care provider or pharmacist if you have questions. °COMMON BRAND NAME(S): Bynfezia, Sandostatin, Sandostatin LAR °What should I tell my health care provider before I take this medicine? °They need to know if you have any of these conditions: °· diabetes °· gallbladder disease °· kidney disease °· liver disease °· an unusual or allergic reaction to octreotide, other medicines, foods, dyes, or preservatives °· pregnant or trying to get pregnant °· breast-feeding °How should I use this medicine? °This medicine is for injection under the skin or into a vein (only in emergency situations). It is usually given by a health care professional in a hospital or clinic setting. °If you get this medicine at home, you will be taught how to prepare and give this medicine. Allow the injection solution to come to room temperature before use. Do not warm it artificially. Use exactly as directed. Take your medicine at regular intervals. Do not take your medicine more often than directed. °It is important that you put your used needles and syringes in a special sharps container. Do not put them in a trash can. If you do not have a sharps container, call your pharmacist or healthcare provider to get one. °Talk to your pediatrician regarding the use of this medicine in children. Special care may be needed. °Overdosage: If you think you have taken too much of this medicine contact a poison control center or emergency room at once. °NOTE: This medicine is only for you. Do not share this medicine with others. °What if I miss a dose? °If you miss a dose, take it as soon as you can. If it is almost time for your  next dose, take only that dose. Do not take double or extra doses. °What may interact with this medicine? °Do not take this medicine with any of the following medications: °· cisapride °· droperidol °· general anesthetics °· grepafloxacin °· perphenazine °· thioridazine °This medicine may also interact with the following medications: °· bromocriptine °· cyclosporine °· diuretics °· medicines for blood pressure, heart disease, irregular heart beat °· medicines for diabetes, including insulin °· quinidine °This list may not describe all possible interactions. Give your health care provider a list of all the medicines, herbs, non-prescription drugs, or dietary supplements you use. Also tell them if you smoke, drink alcohol, or use illegal drugs. Some items may interact with your medicine. °What should I watch for while using this medicine? °Visit your doctor or health care professional for regular checks on your progress. °To help reduce irritation at the injection site, use a different site for each injection and make sure the solution is at room temperature before use. °This medicine may cause decreases in blood sugar. Signs of low blood sugar include chills, cool, pale skin or cold sweats, drowsiness, extreme hunger, fast heartbeat, headache, nausea, nervousness or anxiety, shakiness, trembling, unsteadiness, tiredness, or weakness. Contact your doctor or health care professional right away if you experience any of these symptoms. °This medicine may increase blood sugar. Ask your healthcare provider if changes in diet or medicines are needed if you have diabetes. °This medicine may cause a decrease in vitamin B12. You should make sure that you get enough vitamin B12   while you are taking this medicine. Discuss the foods you eat and the vitamins you take with your health care professional. °What side effects may I notice from receiving this medicine? °Side effects that you should report to your doctor or health care  professional as soon as possible: °· allergic reactions like skin rash, itching or hives, swelling of the face, lips, or tongue °· decreases in blood sugar °· changes in heart rate °· severe stomach pain °·  °signs and symptoms of high blood sugar such as being more thirsty or hungry or having to urinate more than normal. You may also feel very tired or have blurry vision. °Side effects that usually do not require medical attention (report to your doctor or health care professional if they continue or are bothersome): °· diarrhea or constipation °· gas or stomach pain °· nausea, vomiting °· pain, redness, swelling and irritation at site where injected °This list may not describe all possible side effects. Call your doctor for medical advice about side effects. You may report side effects to FDA at 1-800-FDA-1088. °Where should I keep my medicine? °Keep out of the reach of children. °Store in a refrigerator between 2 and 8 degrees C (36 and 46 degrees F). Protect from light. Allow to come to room temperature naturally. Do not use artificial heat. If protected from light, the injection may be stored at room temperature between 20 and 30 degrees C (70 and 86 degrees F) for 14 days. After the initial use, throw away any unused portion of a multiple dose vial after 14 days. Throw away unused portions of the ampules after use. °NOTE: This sheet is a summary. It may not cover all possible information. If you have questions about this medicine, talk to your doctor, pharmacist, or health care provider. °© 2020 [employer] (2018-04-09 08:07:09) ° °

## 2019-01-22 ENCOUNTER — Telehealth: Payer: Self-pay | Admitting: Hematology

## 2019-01-22 NOTE — Telephone Encounter (Signed)
R/s apt per 7/10 sch message - left message for patient with appt date and time

## 2019-01-28 ENCOUNTER — Inpatient Hospital Stay: Payer: 59

## 2019-01-28 ENCOUNTER — Other Ambulatory Visit: Payer: Self-pay

## 2019-01-28 ENCOUNTER — Other Ambulatory Visit: Payer: 59

## 2019-01-28 DIAGNOSIS — C7A012 Malignant carcinoid tumor of the ileum: Secondary | ICD-10-CM | POA: Diagnosis not present

## 2019-01-28 DIAGNOSIS — C7B09 Secondary carcinoid tumors of other sites: Secondary | ICD-10-CM

## 2019-01-28 LAB — COMPREHENSIVE METABOLIC PANEL
ALT: 29 U/L (ref 0–44)
AST: 40 U/L (ref 15–41)
Albumin: 4.1 g/dL (ref 3.5–5.0)
Alkaline Phosphatase: 64 U/L (ref 38–126)
Anion gap: 10 (ref 5–15)
BUN: 9 mg/dL (ref 8–23)
CO2: 27 mmol/L (ref 22–32)
Calcium: 8.7 mg/dL — ABNORMAL LOW (ref 8.9–10.3)
Chloride: 105 mmol/L (ref 98–111)
Creatinine, Ser: 0.89 mg/dL (ref 0.44–1.00)
GFR calc Af Amer: >60 mL/min (ref >60)
GFR calc non Af Amer: >60 mL/min (ref >60)
Glucose, Bld: 111 mg/dL — ABNORMAL HIGH (ref 70–99)
Potassium: 3.9 mmol/L (ref 3.5–5.1)
Sodium: 142 mmol/L (ref 135–145)
Total Bilirubin: 0.5 mg/dL (ref 0.3–1.2)
Total Protein: 7.5 g/dL (ref 6.5–8.1)

## 2019-01-28 LAB — CBC WITH DIFFERENTIAL/PLATELET
Abs Immature Granulocytes: 0.02 10*3/uL (ref 0.00–0.07)
Basophils Absolute: 0.1 10*3/uL (ref 0.0–0.1)
Basophils Relative: 1 %
Eosinophils Absolute: 0.1 10*3/uL (ref 0.0–0.5)
Eosinophils Relative: 1 %
HCT: 43.8 % (ref 36.0–46.0)
Hemoglobin: 14.1 g/dL (ref 12.0–15.0)
Immature Granulocytes: 0 %
Lymphocytes Relative: 43 %
Lymphs Abs: 2.1 10*3/uL (ref 0.7–4.0)
MCH: 29.5 pg (ref 26.0–34.0)
MCHC: 32.2 g/dL (ref 30.0–36.0)
MCV: 91.6 fL (ref 80.0–100.0)
Monocytes Absolute: 0.3 10*3/uL (ref 0.1–1.0)
Monocytes Relative: 7 %
Neutro Abs: 2.3 10*3/uL (ref 1.7–7.7)
Neutrophils Relative %: 48 %
Platelets: 230 10*3/uL (ref 150–400)
RBC: 4.78 MIL/uL (ref 3.87–5.11)
RDW: 12.8 % (ref 11.5–15.5)
WBC: 4.9 10*3/uL (ref 4.0–10.5)
nRBC: 0 % (ref 0.0–0.2)

## 2019-01-28 LAB — VITAMIN B12: Vitamin B-12: 181 pg/mL (ref 180–914)

## 2019-01-29 ENCOUNTER — Ambulatory Visit (HOSPITAL_COMMUNITY)
Admission: RE | Admit: 2019-01-29 | Discharge: 2019-01-29 | Disposition: A | Discharge status: Home / Self Care | Payer: 59 | Source: Ambulatory Visit | Attending: Hematology | Admitting: Hematology

## 2019-01-29 DIAGNOSIS — C7B09 Secondary carcinoid tumors of other sites: Secondary | ICD-10-CM

## 2019-01-29 LAB — CHROMOGRANIN A: Chromogranin A (ng/mL): 235.6 ng/mL — ABNORMAL HIGH (ref 0.0–101.8)

## 2019-01-29 MED ORDER — IOHEXOL 300 MG/ML  SOLN
100.0000 mL | Freq: Once | INTRAMUSCULAR | Status: AC | PRN
  Administered 2019-01-29: 07:00:00 100 mL via INTRAVENOUS

## 2019-01-29 MED ORDER — SODIUM CHLORIDE (PF) 0.9 % IJ SOLN
INTRAMUSCULAR | Status: AC
  Filled 2019-01-29: qty 50

## 2019-02-01 ENCOUNTER — Telehealth: Payer: Self-pay

## 2019-02-01 ENCOUNTER — Telehealth: Payer: Self-pay | Admitting: Hematology

## 2019-02-01 DIAGNOSIS — C7A012 Malignant carcinoid tumor of the ileum: Secondary | ICD-10-CM | POA: Diagnosis not present

## 2019-02-01 LAB — METHYLMALONIC ACID, SERUM: Methylmalonic Acid, Quantitative: 340 nmol/L (ref 0–378)

## 2019-02-01 NOTE — Telephone Encounter (Signed)
-----   Message from Truitt Merle, MD sent at 02/01/2019 12:01 PM EDT ----- Please let her know the CT scan results, stable liver and peritoneal metastasis, no concerns, continue sandostatin injection, I am seeing her next month. I reviewed her lab also, tumor marker stable, B12 on low normal, I am still waiting for methylmalonic level, I recommend her to take OTC B12 1054mcg daily, thanks   Truitt Merle  02/01/2019

## 2019-02-01 NOTE — Telephone Encounter (Signed)
Spoke with patient regarding CT and lab results.  Per Dr. Burr Medico CT scan shows stabler liver and peritoneal metastasis, no concerns, continue Sandostatin injection.  Labs tumor marker is table, B12 is low normal recommend taking an OTC B12 1000 mcg daily.  She states she will do this.  She is wondering what dose of Biotin Dr. Burr Medico recommends.  Also since all of this has been discussed she would like to cancel her office visit on 8/3 and just get her injection.  Also this is the last injection she has schedule.  Explained Dr. Burr Medico will send a scheduling message for future injections.

## 2019-02-01 NOTE — Telephone Encounter (Signed)
OK, I will send a schedule message. Biotin is OTC, she can take 35mcg-100mcg daily.   Truitt Merle MD

## 2019-02-01 NOTE — Telephone Encounter (Signed)
Scheduled appt per 7/20 sch message- unable to reach pt . Left message with appt date and time

## 2019-02-05 LAB — 5 HIAA, QUANTITATIVE, URINE, 24 HOUR
5-HIAA, Ur: 9 mg/L
5-HIAA,Quant.,24 Hr Urine: 12.2 mg/24 hr (ref 0.0–14.9)
Total Volume: 1350

## 2019-02-05 NOTE — Telephone Encounter (Signed)
Spoke with patient regarding Biotin and B12 supplements and dosage.

## 2019-02-12 ENCOUNTER — Other Ambulatory Visit: Payer: 59

## 2019-02-15 ENCOUNTER — Other Ambulatory Visit: Payer: Self-pay

## 2019-02-15 ENCOUNTER — Inpatient Hospital Stay: Payer: 59 | Attending: Hematology

## 2019-02-15 ENCOUNTER — Ambulatory Visit: Payer: 59

## 2019-02-15 ENCOUNTER — Ambulatory Visit: Payer: 59 | Admitting: Hematology

## 2019-02-15 VITALS — BP 135/75 | HR 74 | Temp 98.2°F | Resp 16

## 2019-02-15 DIAGNOSIS — C7B09 Secondary carcinoid tumors of other sites: Secondary | ICD-10-CM

## 2019-02-15 DIAGNOSIS — Z79899 Other long term (current) drug therapy: Secondary | ICD-10-CM | POA: Diagnosis not present

## 2019-02-15 DIAGNOSIS — C7A012 Malignant carcinoid tumor of the ileum: Secondary | ICD-10-CM

## 2019-02-15 MED ORDER — OCTREOTIDE ACETATE 30 MG IM KIT
PACK | INTRAMUSCULAR | Status: AC
  Filled 2019-02-15: qty 1

## 2019-02-15 MED ORDER — OCTREOTIDE ACETATE 30 MG IM KIT
30.0000 mg | PACK | Freq: Once | INTRAMUSCULAR | Status: AC
  Administered 2019-02-15: 30 mg via INTRAMUSCULAR

## 2019-02-15 NOTE — Patient Instructions (Addendum)
Octreotide injection solution °What is this medicine? °OCTREOTIDE (ok TREE oh tide) is used to reduce blood levels of growth hormone in patients with a condition called acromegaly. This medicine also reduces flushing and watery diarrhea caused by certain types of cancer. °This medicine may be used for other purposes; ask your health care provider or pharmacist if you have questions. °COMMON BRAND NAME(S): Bynfezia, Sandostatin, Sandostatin LAR °What should I tell my health care provider before I take this medicine? °They need to know if you have any of these conditions: °· diabetes °· gallbladder disease °· kidney disease °· liver disease °· an unusual or allergic reaction to octreotide, other medicines, foods, dyes, or preservatives °· pregnant or trying to get pregnant °· breast-feeding °How should I use this medicine? °This medicine is for injection under the skin or into a vein (only in emergency situations). It is usually given by a health care professional in a hospital or clinic setting. °If you get this medicine at home, you will be taught how to prepare and give this medicine. Allow the injection solution to come to room temperature before use. Do not warm it artificially. Use exactly as directed. Take your medicine at regular intervals. Do not take your medicine more often than directed. °It is important that you put your used needles and syringes in a special sharps container. Do not put them in a trash can. If you do not have a sharps container, call your pharmacist or healthcare provider to get one. °Talk to your pediatrician regarding the use of this medicine in children. Special care may be needed. °Overdosage: If you think you have taken too much of this medicine contact a poison control center or emergency room at once. °NOTE: This medicine is only for you. Do not share this medicine with others. °What if I miss a dose? °If you miss a dose, take it as soon as you can. If it is almost time for your  next dose, take only that dose. Do not take double or extra doses. °What may interact with this medicine? °Do not take this medicine with any of the following medications: °· cisapride °· droperidol °· general anesthetics °· grepafloxacin °· perphenazine °· thioridazine °This medicine may also interact with the following medications: °· bromocriptine °· cyclosporine °· diuretics °· medicines for blood pressure, heart disease, irregular heart beat °· medicines for diabetes, including insulin °· quinidine °This list may not describe all possible interactions. Give your health care provider a list of all the medicines, herbs, non-prescription drugs, or dietary supplements you use. Also tell them if you smoke, drink alcohol, or use illegal drugs. Some items may interact with your medicine. °What should I watch for while using this medicine? °Visit your doctor or health care professional for regular checks on your progress. °To help reduce irritation at the injection site, use a different site for each injection and make sure the solution is at room temperature before use. °This medicine may cause decreases in blood sugar. Signs of low blood sugar include chills, cool, pale skin or cold sweats, drowsiness, extreme hunger, fast heartbeat, headache, nausea, nervousness or anxiety, shakiness, trembling, unsteadiness, tiredness, or weakness. Contact your doctor or health care professional right away if you experience any of these symptoms. °This medicine may increase blood sugar. Ask your healthcare provider if changes in diet or medicines are needed if you have diabetes. °This medicine may cause a decrease in vitamin B12. You should make sure that you get enough vitamin B12   while you are taking this medicine. Discuss the foods you eat and the vitamins you take with your health care professional. °What side effects may I notice from receiving this medicine? °Side effects that you should report to your doctor or health care  professional as soon as possible: °· allergic reactions like skin rash, itching or hives, swelling of the face, lips, or tongue °· decreases in blood sugar °· changes in heart rate °· severe stomach pain °·  °signs and symptoms of high blood sugar such as being more thirsty or hungry or having to urinate more than normal. You may also feel very tired or have blurry vision. °Side effects that usually do not require medical attention (report to your doctor or health care professional if they continue or are bothersome): °· diarrhea or constipation °· gas or stomach pain °· nausea, vomiting °· pain, redness, swelling and irritation at site where injected °This list may not describe all possible side effects. Call your doctor for medical advice about side effects. You may report side effects to FDA at 1-800-FDA-1088. °Where should I keep my medicine? °Keep out of the reach of children. °Store in a refrigerator between 2 and 8 degrees C (36 and 46 degrees F). Protect from light. Allow to come to room temperature naturally. Do not use artificial heat. If protected from light, the injection may be stored at room temperature between 20 and 30 degrees C (70 and 86 degrees F) for 14 days. After the initial use, throw away any unused portion of a multiple dose vial after 14 days. Throw away unused portions of the ampules after use. °NOTE: This sheet is a summary. It may not cover all possible information. If you have questions about this medicine, talk to your doctor, pharmacist, or health care provider. °© 2020 [employer] (2018-04-09 08:07:09) ° °

## 2019-03-15 ENCOUNTER — Other Ambulatory Visit: Payer: Self-pay

## 2019-03-15 ENCOUNTER — Inpatient Hospital Stay: Payer: 59

## 2019-03-15 VITALS — BP 134/72 | HR 72 | Temp 98.2°F | Resp 16

## 2019-03-15 DIAGNOSIS — C7A012 Malignant carcinoid tumor of the ileum: Secondary | ICD-10-CM | POA: Diagnosis not present

## 2019-03-15 DIAGNOSIS — C7B09 Secondary carcinoid tumors of other sites: Secondary | ICD-10-CM

## 2019-03-15 MED ORDER — OCTREOTIDE ACETATE 30 MG IM KIT
PACK | INTRAMUSCULAR | Status: AC
  Filled 2019-03-15: qty 1

## 2019-03-15 MED ORDER — OCTREOTIDE ACETATE 30 MG IM KIT
30.0000 mg | PACK | Freq: Once | INTRAMUSCULAR | Status: AC
  Administered 2019-03-15: 30 mg via INTRAMUSCULAR

## 2019-03-15 NOTE — Patient Instructions (Signed)
Octreotide injection solution °What is this medicine? °OCTREOTIDE (ok TREE oh tide) is used to reduce blood levels of growth hormone in patients with a condition called acromegaly. This medicine also reduces flushing and watery diarrhea caused by certain types of cancer. °This medicine may be used for other purposes; ask your health care provider or pharmacist if you have questions. °COMMON BRAND NAME(S): Bynfezia, Sandostatin, Sandostatin LAR °What should I tell my health care provider before I take this medicine? °They need to know if you have any of these conditions: °· diabetes °· gallbladder disease °· kidney disease °· liver disease °· an unusual or allergic reaction to octreotide, other medicines, foods, dyes, or preservatives °· pregnant or trying to get pregnant °· breast-feeding °How should I use this medicine? °This medicine is for injection under the skin or into a vein (only in emergency situations). It is usually given by a health care professional in a hospital or clinic setting. °If you get this medicine at home, you will be taught how to prepare and give this medicine. Allow the injection solution to come to room temperature before use. Do not warm it artificially. Use exactly as directed. Take your medicine at regular intervals. Do not take your medicine more often than directed. °It is important that you put your used needles and syringes in a special sharps container. Do not put them in a trash can. If you do not have a sharps container, call your pharmacist or healthcare provider to get one. °Talk to your pediatrician regarding the use of this medicine in children. Special care may be needed. °Overdosage: If you think you have taken too much of this medicine contact a poison control center or emergency room at once. °NOTE: This medicine is only for you. Do not share this medicine with others. °What if I miss a dose? °If you miss a dose, take it as soon as you can. If it is almost time for your  next dose, take only that dose. Do not take double or extra doses. °What may interact with this medicine? °Do not take this medicine with any of the following medications: °· cisapride °· droperidol °· general anesthetics °· grepafloxacin °· perphenazine °· thioridazine °This medicine may also interact with the following medications: °· bromocriptine °· cyclosporine °· diuretics °· medicines for blood pressure, heart disease, irregular heart beat °· medicines for diabetes, including insulin °· quinidine °This list may not describe all possible interactions. Give your health care provider a list of all the medicines, herbs, non-prescription drugs, or dietary supplements you use. Also tell them if you smoke, drink alcohol, or use illegal drugs. Some items may interact with your medicine. °What should I watch for while using this medicine? °Visit your doctor or health care professional for regular checks on your progress. °To help reduce irritation at the injection site, use a different site for each injection and make sure the solution is at room temperature before use. °This medicine may cause decreases in blood sugar. Signs of low blood sugar include chills, cool, pale skin or cold sweats, drowsiness, extreme hunger, fast heartbeat, headache, nausea, nervousness or anxiety, shakiness, trembling, unsteadiness, tiredness, or weakness. Contact your doctor or health care professional right away if you experience any of these symptoms. °This medicine may increase blood sugar. Ask your healthcare provider if changes in diet or medicines are needed if you have diabetes. °This medicine may cause a decrease in vitamin B12. You should make sure that you get enough vitamin B12   while you are taking this medicine. Discuss the foods you eat and the vitamins you take with your health care professional. °What side effects may I notice from receiving this medicine? °Side effects that you should report to your doctor or health care  professional as soon as possible: °· allergic reactions like skin rash, itching or hives, swelling of the face, lips, or tongue °· decreases in blood sugar °· changes in heart rate °· severe stomach pain °·  °signs and symptoms of high blood sugar such as being more thirsty or hungry or having to urinate more than normal. You may also feel very tired or have blurry vision. °Side effects that usually do not require medical attention (report to your doctor or health care professional if they continue or are bothersome): °· diarrhea or constipation °· gas or stomach pain °· nausea, vomiting °· pain, redness, swelling and irritation at site where injected °This list may not describe all possible side effects. Call your doctor for medical advice about side effects. You may report side effects to FDA at 1-800-FDA-1088. °Where should I keep my medicine? °Keep out of the reach of children. °Store in a refrigerator between 2 and 8 degrees C (36 and 46 degrees F). Protect from light. Allow to come to room temperature naturally. Do not use artificial heat. If protected from light, the injection may be stored at room temperature between 20 and 30 degrees C (70 and 86 degrees F) for 14 days. After the initial use, throw away any unused portion of a multiple dose vial after 14 days. Throw away unused portions of the ampules after use. °NOTE: This sheet is a summary. It may not cover all possible information. If you have questions about this medicine, talk to your doctor, pharmacist, or health care provider. °© 2020 [employer] (2018-04-09 08:07:09) ° °

## 2019-03-25 ENCOUNTER — Telehealth: Payer: Self-pay | Admitting: Hematology

## 2019-03-25 NOTE — Telephone Encounter (Signed)
Returned patient's phone call regarding appointments in October, per patient's request 10/28 Injection appointment has been moved to 10/26.

## 2019-03-26 ENCOUNTER — Telehealth: Payer: Self-pay

## 2019-03-26 NOTE — Telephone Encounter (Signed)
Left message for patient with scheduled appointments for 05/17/2019.

## 2019-04-12 ENCOUNTER — Inpatient Hospital Stay: Payer: 59 | Attending: Hematology

## 2019-04-12 ENCOUNTER — Other Ambulatory Visit: Payer: Self-pay

## 2019-04-12 VITALS — BP 128/72 | HR 72 | Temp 98.7°F | Resp 19

## 2019-04-12 DIAGNOSIS — C7A012 Malignant carcinoid tumor of the ileum: Secondary | ICD-10-CM | POA: Diagnosis not present

## 2019-04-12 DIAGNOSIS — Z79899 Other long term (current) drug therapy: Secondary | ICD-10-CM | POA: Insufficient documentation

## 2019-04-12 DIAGNOSIS — C7B09 Secondary carcinoid tumors of other sites: Secondary | ICD-10-CM

## 2019-04-12 MED ORDER — OCTREOTIDE ACETATE 30 MG IM KIT
30.0000 mg | PACK | Freq: Once | INTRAMUSCULAR | Status: AC
  Administered 2019-04-12: 30 mg via INTRAMUSCULAR

## 2019-04-12 NOTE — Patient Instructions (Signed)
Octreotide injection solution °What is this medicine? °OCTREOTIDE (ok TREE oh tide) is used to reduce blood levels of growth hormone in patients with a condition called acromegaly. This medicine also reduces flushing and watery diarrhea caused by certain types of cancer. °This medicine may be used for other purposes; ask your health care provider or pharmacist if you have questions. °COMMON BRAND NAME(S): Bynfezia, Sandostatin, Sandostatin LAR °What should I tell my health care provider before I take this medicine? °They need to know if you have any of these conditions: °· diabetes °· gallbladder disease °· kidney disease °· liver disease °· an unusual or allergic reaction to octreotide, other medicines, foods, dyes, or preservatives °· pregnant or trying to get pregnant °· breast-feeding °How should I use this medicine? °This medicine is for injection under the skin or into a vein (only in emergency situations). It is usually given by a health care professional in a hospital or clinic setting. °If you get this medicine at home, you will be taught how to prepare and give this medicine. Allow the injection solution to come to room temperature before use. Do not warm it artificially. Use exactly as directed. Take your medicine at regular intervals. Do not take your medicine more often than directed. °It is important that you put your used needles and syringes in a special sharps container. Do not put them in a trash can. If you do not have a sharps container, call your pharmacist or healthcare provider to get one. °Talk to your pediatrician regarding the use of this medicine in children. Special care may be needed. °Overdosage: If you think you have taken too much of this medicine contact a poison control center or emergency room at once. °NOTE: This medicine is only for you. Do not share this medicine with others. °What if I miss a dose? °If you miss a dose, take it as soon as you can. If it is almost time for your  next dose, take only that dose. Do not take double or extra doses. °What may interact with this medicine? °Do not take this medicine with any of the following medications: °· cisapride °· droperidol °· general anesthetics °· grepafloxacin °· perphenazine °· thioridazine °This medicine may also interact with the following medications: °· bromocriptine °· cyclosporine °· diuretics °· medicines for blood pressure, heart disease, irregular heart beat °· medicines for diabetes, including insulin °· quinidine °This list may not describe all possible interactions. Give your health care provider a list of all the medicines, herbs, non-prescription drugs, or dietary supplements you use. Also tell them if you smoke, drink alcohol, or use illegal drugs. Some items may interact with your medicine. °What should I watch for while using this medicine? °Visit your doctor or health care professional for regular checks on your progress. °To help reduce irritation at the injection site, use a different site for each injection and make sure the solution is at room temperature before use. °This medicine may cause decreases in blood sugar. Signs of low blood sugar include chills, cool, pale skin or cold sweats, drowsiness, extreme hunger, fast heartbeat, headache, nausea, nervousness or anxiety, shakiness, trembling, unsteadiness, tiredness, or weakness. Contact your doctor or health care professional right away if you experience any of these symptoms. °This medicine may increase blood sugar. Ask your healthcare provider if changes in diet or medicines are needed if you have diabetes. °This medicine may cause a decrease in vitamin B12. You should make sure that you get enough vitamin B12   while you are taking this medicine. Discuss the foods you eat and the vitamins you take with your health care professional. °What side effects may I notice from receiving this medicine? °Side effects that you should report to your doctor or health care  professional as soon as possible: °· allergic reactions like skin rash, itching or hives, swelling of the face, lips, or tongue °· decreases in blood sugar °· changes in heart rate °· severe stomach pain °·  °signs and symptoms of high blood sugar such as being more thirsty or hungry or having to urinate more than normal. You may also feel very tired or have blurry vision. °Side effects that usually do not require medical attention (report to your doctor or health care professional if they continue or are bothersome): °· diarrhea or constipation °· gas or stomach pain °· nausea, vomiting °· pain, redness, swelling and irritation at site where injected °This list may not describe all possible side effects. Call your doctor for medical advice about side effects. You may report side effects to FDA at 1-800-FDA-1088. °Where should I keep my medicine? °Keep out of the reach of children. °Store in a refrigerator between 2 and 8 degrees C (36 and 46 degrees F). Protect from light. Allow to come to room temperature naturally. Do not use artificial heat. If protected from light, the injection may be stored at room temperature between 20 and 30 degrees C (70 and 86 degrees F) for 14 days. After the initial use, throw away any unused portion of a multiple dose vial after 14 days. Throw away unused portions of the ampules after use. °NOTE: This sheet is a summary. It may not cover all possible information. If you have questions about this medicine, talk to your doctor, pharmacist, or health care provider. °© 2020 [employer] (2018-04-09 08:07:09) ° °

## 2019-04-28 IMAGING — CT CT RENAL STONE PROTOCOL
2 of 4 series · 16 of 46 positions shown, 18 images · non-contrast
Comparison: PET-CT 08/20/2017, CT abdomen pelvis 05/19/2017,
06/17/2016

CLINICAL DATA: Flank pain on the right side

EXAM:
CT ABDOMEN AND PELVIS WITHOUT CONTRAST
TECHNIQUE: Multidetector CT imaging of the abdomen and pelvis was performed
following the standard protocol without IV contrast.

[Series 2: axial st · axial · 0.69mm/px · z∈[-780,-420]mm · 13 of 80 slices shown, 15 images]
[im 4/80  soft-tissue]
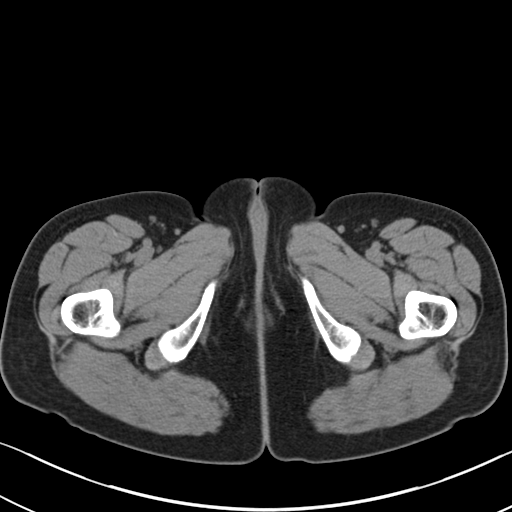
[im 4/80  bone]
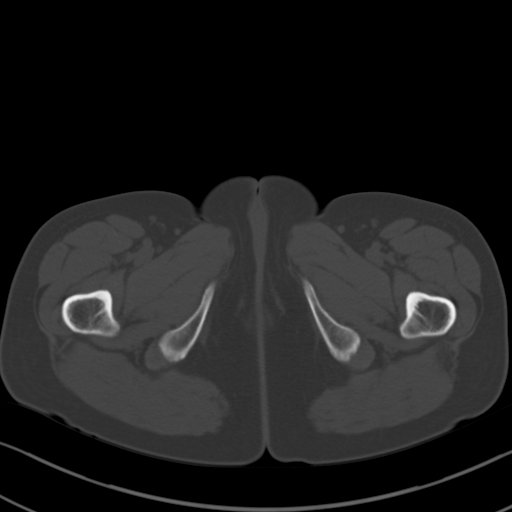
[im 12/80  soft-tissue]
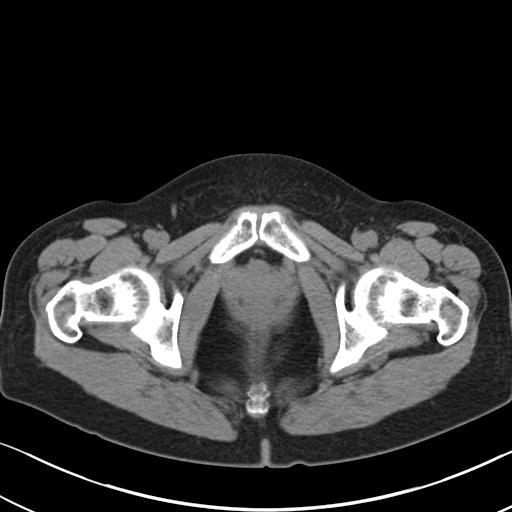
[im 16/80  soft-tissue]
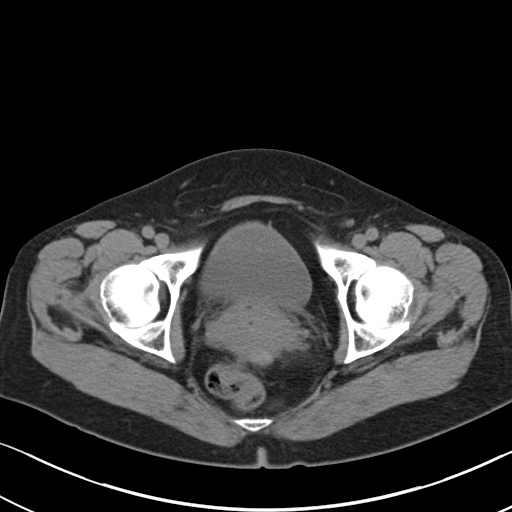
[im 24/80  soft-tissue]
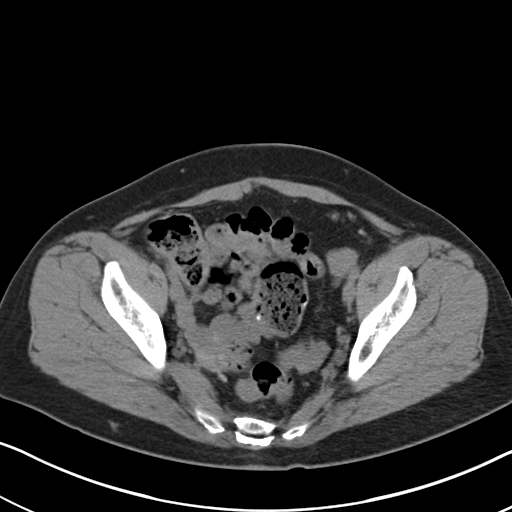
[im 28/80  soft-tissue]
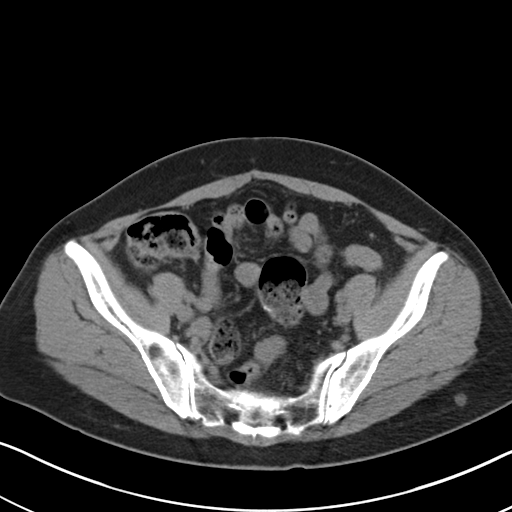
[im 36/80  soft-tissue]
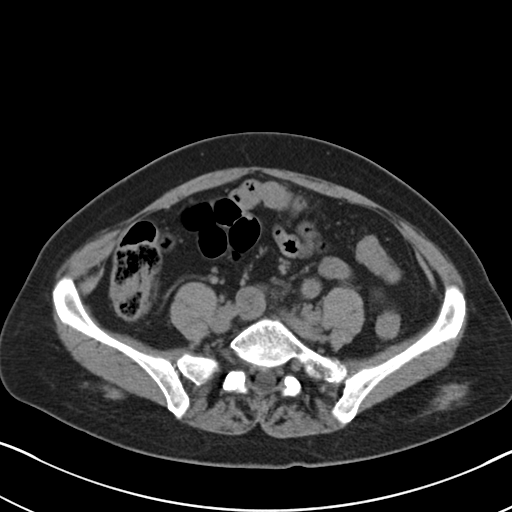
[im 40/80  soft-tissue]
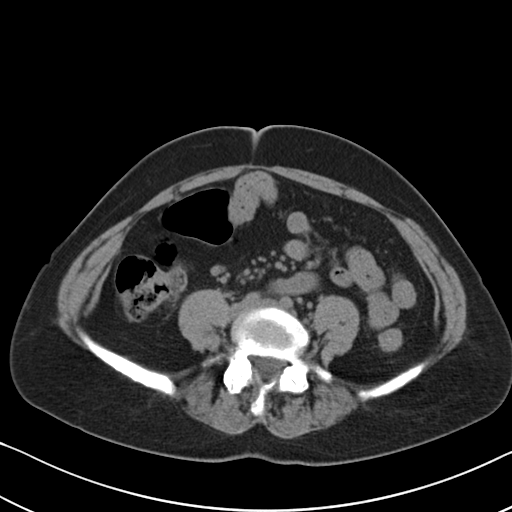
[im 44/80  soft-tissue]
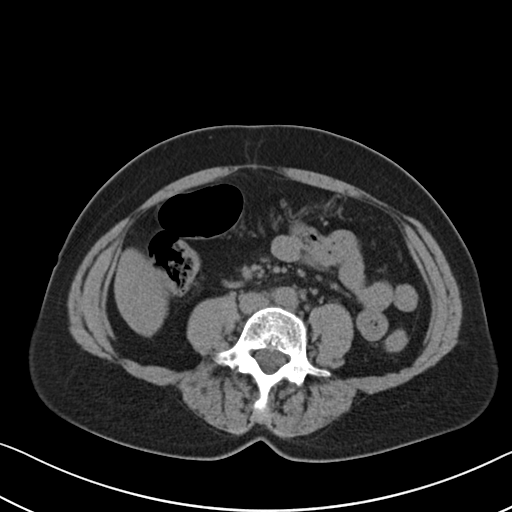
[im 52/80  soft-tissue]
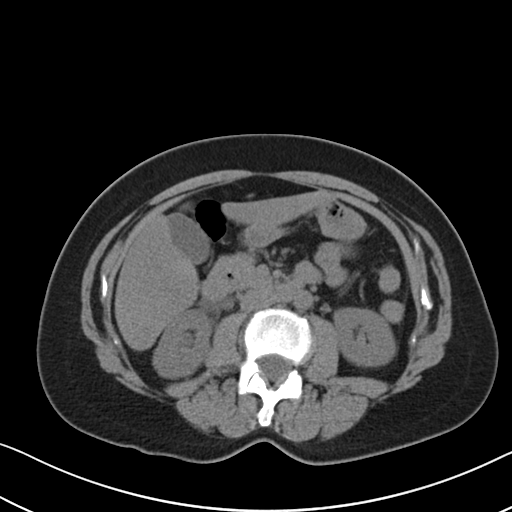
[im 52/80  bone]
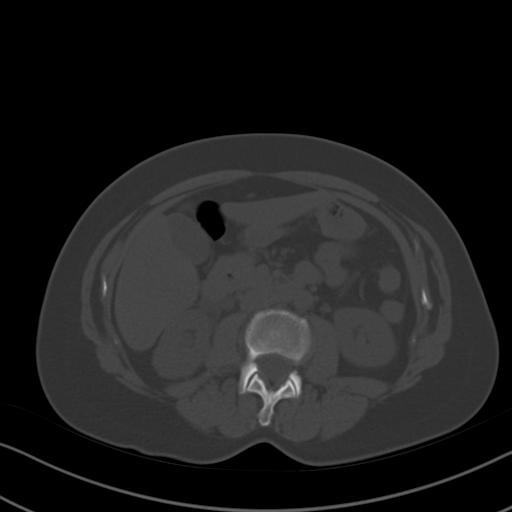
[im 56/80  soft-tissue]
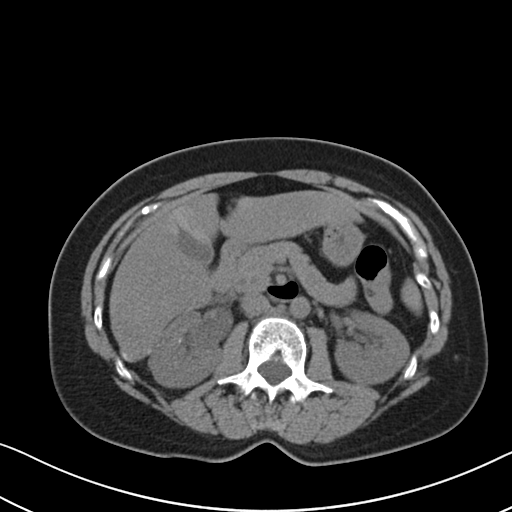
[im 64/80  soft-tissue]
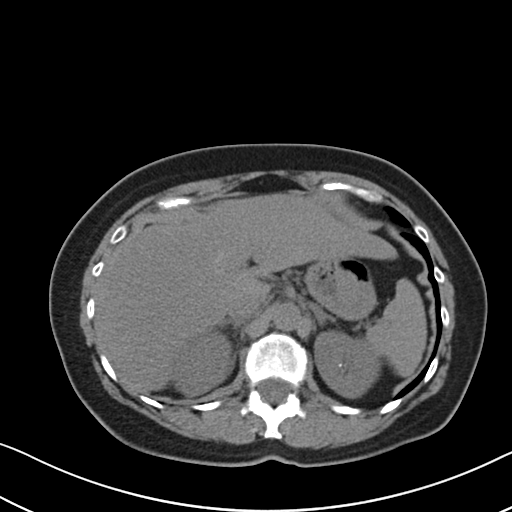
[im 68/80  soft-tissue]
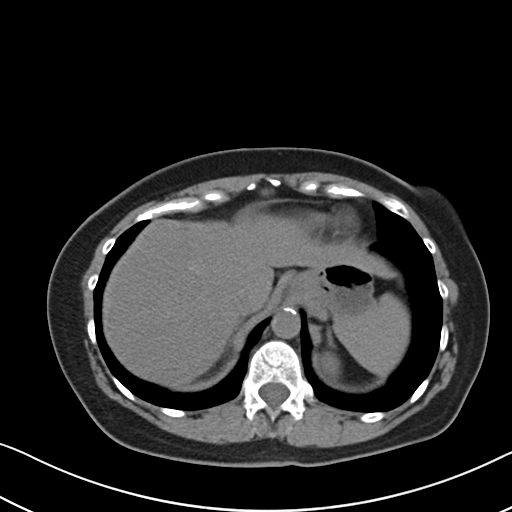
[im 76/80  soft-tissue]
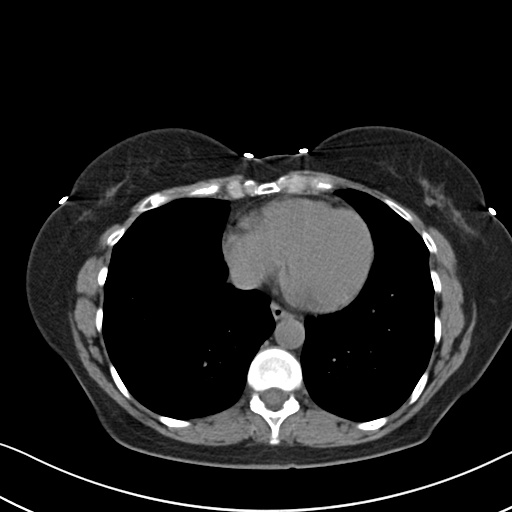

[Series 5: coronal · coronal · 0.65mm/px · 3 of 125 slices shown]
[im 42/125  soft-tissue]
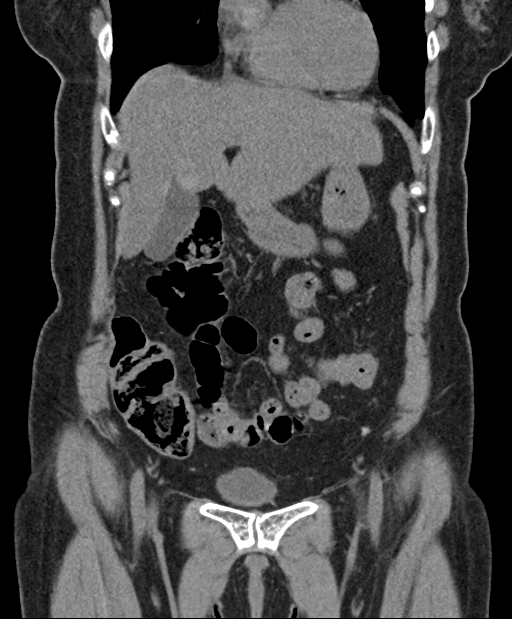
[im 56/125  soft-tissue]
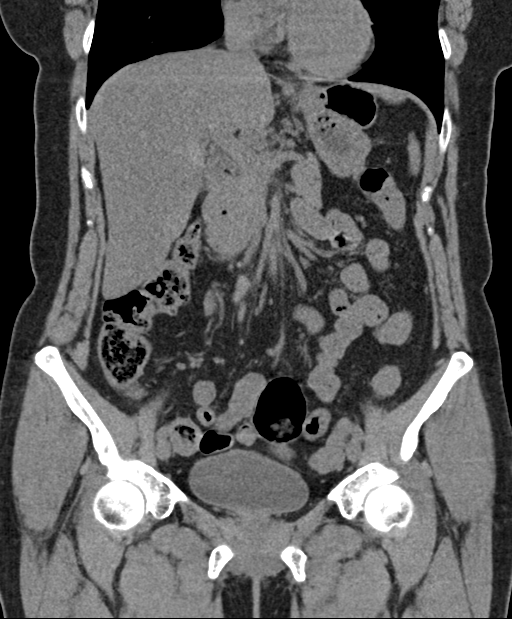
[im 69/125  soft-tissue]
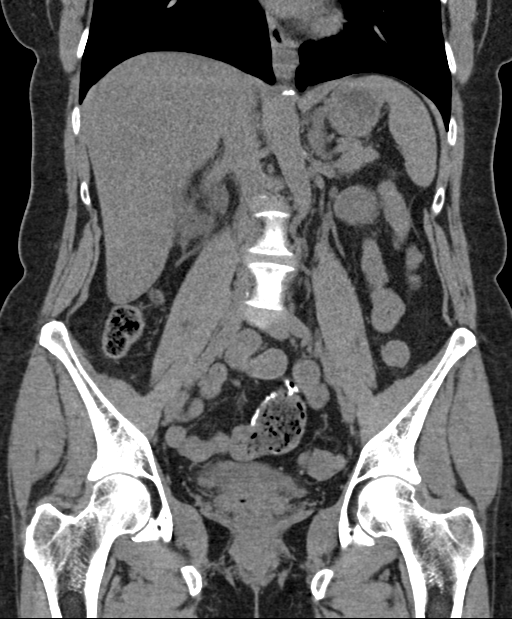

[16 of 46 positions shown; findings below may reference images not displayed]

FINDINGS: Lower chest: Lung bases demonstrate no acute consolidation or
pleural effusion. Normal heart size.

Hepatobiliary: Hepatic metastatic foci are difficult to see without
contrast. No calcified gallstones or biliary dilatation.

Pancreas: Unremarkable. No pancreatic ductal dilatation or
surrounding inflammatory changes.

Spleen: Normal in size without focal abnormality.

Adrenals/Urinary Tract: Adrenal glands are within normal limits.
There are bilateral intrarenal calculi. On the left, this measures
up to 5 mm in the upper pole. On the right, this measures up to 4 mm
in the mid to lower pole. Mild to moderate right hydronephrosis and
proximal hydroureter, secondary to a 5 x 7 mm stone in the proximal
right ureter at the approximate L3-L4 level. The urinary bladder is
unremarkable.

Stomach/Bowel: Stomach is nonenlarged. No dilated small bowel. No
colon wall thickening. Postsurgical changes at the sigmoid colon.

Vascular/Lymphatic: Mild aortic atherosclerosis. No aneurysmal
dilatation.

Reproductive: Large pelvic/adnexal mass as before, consistent with
metastatic disease.

Other: Negative for free air or free fluid. Multiple intraperitoneal
nodules, no significant change.

Musculoskeletal: No acute or suspicious abnormality.
IMPRESSION: 1. Mild to moderate right hydronephrosis and hydroureter, secondary
to a 5 x 7 mm stone in the proximal right ureter at approximate
L3-L4 level.
2. Multiple bilateral intrarenal calculi
3. Hepatic metastatic foci are better seen on prior
contrast-enhanced exams. Large pelvic and adnexal mass consistent
with metastatic disease, grossly unchanged. Multiple metastatic
peritoneal nodules, no significant change.

## 2019-05-10 ENCOUNTER — Ambulatory Visit: Payer: 59

## 2019-05-12 ENCOUNTER — Ambulatory Visit: Payer: 59 | Admitting: Hematology

## 2019-05-12 ENCOUNTER — Ambulatory Visit: Payer: 59

## 2019-05-12 NOTE — Progress Notes (Signed)
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    08/17/2005 Surgery   Carcinoid tumor of the ileum following several years of paroxysmal abdominal pain. The patient underwent surgical resection of her carcinoid tumor on 08/17/2005. Two out of 13 lymph nodes were positive. The patient's stage was III. The patient was noted to have an elevated urinary 5HIAA level in July 2011.   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 Sandostatin LAR increased to 60 mg IM every 4 weeks on 08/27/2017 due to disease progression. Held from 12/01/2017 per pt's request, restarted at 30mg  IM every 4 weeks on 05/11/2018    10/19/2012 Imaging   CT scan of the abdomen and pelvis with IV contrast on 10/19/2012 showed generally stable disease with the exception of a decrease in the right abdominal mesenteric lymphadenopathy. There have been 2 lesions in the liver which are felt to be stable. One lesion is in the lateral segment of the left hepatic lobe measuring 14 x 14 mm. The second lesion is seen in the lateral segment adjacent to the falciform ligament and measures 9 mm.     08/20/2017 PET scan   [facility] PET  IMPRESSION: 1. Intense radiotracer activity associated with multiple hyperdense liver lesions consistent with well differentiated neuroendocrine tumor hepatic metastasis. 2. Multiple small foci peritoneal metastasis within the lower abdomen and pelvis. 3. Large mass  associated the RIGHT adnexa and small lesions in LEFT adnexa also with intense radiotracer activity consistent with neuroendocrine tumor metastasis to the adnexa. 4. No evidence of pulmonary metastasis or skeletal metastasis.   08/24/2018 Imaging   CT AP W Contrast 08/24/18  IMPRESSION: 1. Multiple metastatic lesions are unchanged from the most recent prior CT. There are multiple hypervascular liver lesions as well as omental and peritoneal masses, which show no increase in size or number from the prior CT. No evidence of new metastatic disease. 2. No acute findings. 3. Hepatic steatosis.     Imaging   A positive octreotide scan was noted coming from the right lower pelvis adjacent to the bladder on 09/06/2010. Initially this was felt to be a uterine fibroid. The patient has continued to have positive octreotide scan on 11/07/2011 and 05/06/2012. CT scans of the abdomen and pelvis were also carried out on 03/02/2010, 02/24/2012 and 06/30/2012. These scans in conjunction with an elevated 5HIAA level of 22.7 on 04/13/2012 and a chromogranin A level which had risen to 18.0 on 03/23/2012 strongly suggested that the patient's disease had recurred.    08/24/2018 Imaging   CT AP W Contrast 08/24/18  IMPRESSION: 1. Multiple metastatic lesions are unchanged from the most recent prior CT. There are multiple hypervascular liver lesions as well as omental and peritoneal masses, which show no increase in size or number from the prior CT. No evidence of new metastatic disease. 2. No acute findings. 3. Hepatic steatosis.   01/29/2019 Imaging   CT AP W Contrast 01/29/19 IMPRESSION: 1. Essentially stable metastatic  carcinoid tumor/neuroendocrine tumor. 2. Multiple round enhancing hepatic lesions unchanged. 3. Bulky peritoneal mass in the posterior cul-de-sac stable. 4. Stable small peritoneal and mesenteric metastasis. 5. No bowel obstruction. Serosal implant along the anti mesenteric border of the  cecum unchanged.      CURRENT THERAPY:  Sandostatin LAR 30 mg IM every 4 weeks, started on 07/07/2012. Sandostatin LAR increased to 60 mg IM every 4 weeks on 08/27/2017 due to disease progression. Held from 12/01/2017 per pt's request, restarted at 30mg  IM every 4 weeks on 05/11/2018  INTERVAL HISTORY:  Vanessa Waller is here for a follow up and ongoing treatment. She presents to the clinic alone. She notes she is doing well. She notes no new changes. She notes she rarely has diarrhea anymore. She denies any issues with Sandostatin injection. She notes her energy and eating is adequate. She notes she takes Potassium very 2-3 days.    REVIEW OF SYSTEMS:   Constitutional: Denies fevers, chills or abnormal weight loss Eyes: Denies blurriness of vision Ears, nose, mouth, throat, and face: Denies mucositis or sore throat Respiratory: Denies cough, dyspnea or wheezes Cardiovascular: Denies palpitation, chest discomfort or lower extremity swelling Gastrointestinal:  Denies nausea, heartburn (+) diarrhea rarely Skin: Denies abnormal skin rashes Lymphatics: Denies new lymphadenopathy or easy bruising Neurological:Denies numbness, tingling or new weaknesses Behavioral/Psych: Mood is stable, no new changes  All other systems were reviewed with the patient and are negative.  MEDICAL HISTORY:  Past Medical History:  Diagnosis Date  . 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 carcinoid tumor dx'd 08/2005  . Diverticulosis   . Hepatic steatosis 02/24/12  . History of kidney stones    x 2   . Hypopotassemia   . 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 Nonspecific elevation of levels of transaminase or lactic acid dehydrogenase (LDH)   . Small bowel obstruction ([place])   . Uterine fibroid     SURGICAL HISTORY: Past Surgical History:  Procedure Laterality Date  . carcinoid tumor resection  2007   ileum  . colonscopy  not sure  .  CYSTOSCOPY/URETEROSCOPY/HOLMIUM LASER/STENT PLACEMENT Right 01/16/2018   Procedure: RIGHT URETEROSCOPY/RIGHT RETROGRADE/HOLMIUM LASER/STENT PLACEMENT;  Surgeon: Ardis Hughs, MD;  Location: [facility];  Service: Urology;  Laterality: Right;  . TUBAL LIGATION      I have reviewed the social history and family history with the patient and they are unchanged from previous note.  ALLERGIES:  is allergic to cephalexin and amoxicillin.  MEDICATIONS:  Current Outpatient Medications  Medication Sig Dispense Refill  . Biotin 10 MG CAPS Take by mouth.    . calcium acetate (PHOSLO) 667 MG capsule Take by mouth every evening.     . Cholecalciferol (VITAMIN D-3) 5000 UNITS TABS Take 1 tablet by mouth every evening.     . loperamide (IMODIUM) 2 MG capsule Take by mouth as needed for diarrhea or loose stools.    . Multiple Vitamin (MULTIVITAMIN) tablet Take 1 tablet by mouth every evening.     [name] octreotide (SANDOSTATIN LAR) 30 MG injection Inject 30 mg into the muscle every 28 (twenty-eight) days.    . potassium chloride SA (KLOR-CON) 20 MEQ tablet Take 1 tablet (20 mEq total) by mouth every other day. 45 tablet 1  . pyridOXINE (B-6) 50 MG tablet Take 50 mg by mouth daily.    . vitamin B-12 (CYANOCOBALAMIN) 500 MCG tablet Take 500 mcg by mouth daily.  No current facility-administered medications for this visit.     PHYSICAL EXAMINATION: ECOG PERFORMANCE STATUS: 0 - Asymptomatic  Vitals:   05/17/19 1532  BP: (!) 158/87  Pulse: 79  Resp: 18  Temp: 98.7 F (37.1 C)  SpO2: 98%   Filed Weights   05/17/19 1532  Weight: 138 lb 12.8 oz (63 kg)    GENERAL:alert, no distress and comfortable SKIN: skin color, texture, turgor are normal, no rashes or significant lesions EYES: normal, Conjunctiva are pink and non-injected, sclera clear  NECK: supple, thyroid normal size, non-tender, without nodularity LYMPH:  no palpable lymphadenopathy in the cervical, axillary  LUNGS:  clear to auscultation and percussion with normal breathing effort HEART: regular rate & rhythm and no murmurs and no lower extremity edema ABDOMEN:abdomen soft, non-tender and normal bowel sounds Musculoskeletal:no cyanosis of digits and no clubbing  NEURO: alert & oriented x 3 with fluent speech, no focal motor/sensory deficits  LABORATORY DATA:  I have reviewed the data as listed CBC Latest Ref Rng & Units 05/17/2019 01/28/2019 12/04/2018  WBC 4.0 - 10.5 K/uL 5.9 4.9 6.1  Hemoglobin 12.0 - 15.0 g/dL 13.9 14.1 13.6  Hematocrit 36.0 - 46.0 % 43.5 43.8 43.5  Platelets 150 - 400 K/uL 259 230 272     CMP Latest Ref Rng & Units 05/17/2019 01/28/2019 12/04/2018  Glucose 70 - 99 mg/dL 95 111(H) 128(H)  BUN 8 - 23 mg/dL 12 9 16   Creatinine 0.44 - 1.00 mg/dL 0.85 0.89 0.97  Sodium 135 - 145 mmol/L 144 142 141  Potassium 3.5 - 5.1 mmol/L 3.5 3.9 3.6  Chloride 98 - 111 mmol/L 104 105 107  CO2 22 - 32 mmol/L 28 27 24   Calcium 8.9 - 10.3 mg/dL 9.1 8.7(L) 9.0  Total Protein 6.5 - 8.1 g/dL 7.6 7.5 7.3  Total Bilirubin 0.3 - 1.2 mg/dL 0.4 0.5 0.3  Alkaline Phos 38 - 126 U/L 84 64 73  AST 15 - 41 U/L 64(H) 40 64(H)  ALT 0 - 44 U/L 66(H) 29 43      RADIOGRAPHIC STUDIES: I have personally reviewed the radiological images as listed and agreed with the findings in the report. No results found.   ASSESSMENT & PLAN:  Vanessa Waller is a 63 y.o. female with   1. 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 She was treated with surgical resectionin 2/007.  -Sheunfortunatelyhas cancer recurrencewith metastasisto the liver, peritoneumand adnexa in late 2013. She has been on Sandostatin injections since 07/07/12. Her diarrhea very mildand she has been toleratingwell.  -Due to mild disease progression in earlier 2019, I increased her dose of Sandostatin injectiondose to 60mg , she came offSandostatin  injectionafter 10/27/2017 per her request, and restart 30mg  dose in 04/2018, she has been tolerating well.  -Her 01/29/19 CT AP showed stable metastatic carcinoid tumor, stable liver lesions, stable bulky peritoneal mass and stable small peritoneal and mesenteric mets. Sandostatin injections are currently controlling her disease, will continue.  -She is overall stable and doing well. She rarely has diarrhea now. She has been tolerating injections well. Labs reviewed, CBC and CMP WNL except AST 64, ALT 66. Chromogranin A still pending. Her Physical exam was unremarkable today.  -I discussed her mild elevation in Liver enzymes has occurred before and can be reactive, will monitor.  -Labs overall adequate to proceed with Injection today and continue monthly  -Will repeat CT AP in late 06/2019.  -F/u in  3 months   2. Hypokalemia -Probably related to her diarrhea -She rarely has diarrhea now. She has imodium to take as needed. -Resolved in 10/2018 labs. I encouraged her to continue oral potassium at least 3 times a week.   3. HTN -Follow-up with primary care physician. -BP at158/87 today (05/17/19)  4. Cancer screening  -She is overdue for mammogram, last mammogram in July 2015. -She will continue follow up with her PCP and other specialists.   5. Mild hair loss   -She is concerned these symptoms are ongoing and may be related to Sandostatin injections. Will monitor.  -TSH level normal. She has been using biotin and her hair is still thinning. She will continue to manage this.   PLAN -I refilled oral potassium today  -She is clinically stable  -Will proceed with Sandostatin injection today and continue every 4 weeks  -lab and CT AP W contrast last week of 06/2019 -F/u in 3 months    No problem-specific Assessment & Plan notes found for this encounter.   Orders Placed This Encounter  Procedures  . CT Abdomen Pelvis W Contrast    Standing Status:   Future    Standing Expiration  Date:   05/16/2020    Order Specific Question:   If indicated for the ordered procedure, I authorize the administration of contrast media per Radiology protocol    Answer:   Yes    Order Specific Question:   Preferred imaging location?    Answer:   [facility]    Order Specific Question:   Is Oral Contrast requested for this exam?    Answer:   Yes, Per Radiology protocol    Order Specific Question:   Radiology Contrast Protocol - do NOT remove file path    Answer:   [email or web address]   All questions were answered. The patient knows to call the clinic with any problems, questions or concerns. No barriers to learning was detected.     Truitt Merle, MD 05/17/2019   I, Joslyn Devon, am acting as scribe for Truitt Merle, MD.   I have reviewed the above documentation for accuracy and completeness, and I agree with the above.

## 2019-05-17 ENCOUNTER — Encounter: Payer: Self-pay | Admitting: Hematology

## 2019-05-17 ENCOUNTER — Other Ambulatory Visit: Payer: Self-pay

## 2019-05-17 ENCOUNTER — Inpatient Hospital Stay: Payer: 59 | Attending: Hematology

## 2019-05-17 ENCOUNTER — Inpatient Hospital Stay: Payer: 59

## 2019-05-17 ENCOUNTER — Inpatient Hospital Stay (HOSPITAL_BASED_OUTPATIENT_CLINIC_OR_DEPARTMENT_OTHER): Payer: 59 | Admitting: Hematology

## 2019-05-17 VITALS — BP 132/74 | Temp 98.7°F

## 2019-05-17 VITALS — BP 158/87 | HR 79 | Temp 98.7°F | Resp 18 | Ht 61.0 in | Wt 138.8 lb

## 2019-05-17 DIAGNOSIS — Z79899 Other long term (current) drug therapy: Secondary | ICD-10-CM | POA: Insufficient documentation

## 2019-05-17 DIAGNOSIS — Z87442 Personal history of urinary calculi: Secondary | ICD-10-CM | POA: Diagnosis not present

## 2019-05-17 DIAGNOSIS — E876 Hypokalemia: Secondary | ICD-10-CM | POA: Insufficient documentation

## 2019-05-17 DIAGNOSIS — C786 Secondary malignant neoplasm of retroperitoneum and peritoneum: Secondary | ICD-10-CM | POA: Diagnosis not present

## 2019-05-17 DIAGNOSIS — C7B09 Secondary carcinoid tumors of other sites: Secondary | ICD-10-CM

## 2019-05-17 DIAGNOSIS — C7A012 Malignant carcinoid tumor of the ileum: Secondary | ICD-10-CM

## 2019-05-17 DIAGNOSIS — K760 Fatty (change of) liver, not elsewhere classified: Secondary | ICD-10-CM | POA: Insufficient documentation

## 2019-05-17 LAB — CBC WITH DIFFERENTIAL/PLATELET
Abs Immature Granulocytes: 0.01 10*3/uL (ref 0.00–0.07)
Basophils Absolute: 0.1 10*3/uL (ref 0.0–0.1)
Basophils Relative: 1 %
Eosinophils Absolute: 0.1 10*3/uL (ref 0.0–0.5)
Eosinophils Relative: 1 %
HCT: 43.5 % (ref 36.0–46.0)
Hemoglobin: 13.9 g/dL (ref 12.0–15.0)
Immature Granulocytes: 0 %
Lymphocytes Relative: 36 %
Lymphs Abs: 2.1 10*3/uL (ref 0.7–4.0)
MCH: 29.8 pg (ref 26.0–34.0)
MCHC: 32 g/dL (ref 30.0–36.0)
MCV: 93.3 fL (ref 80.0–100.0)
Monocytes Absolute: 0.4 10*3/uL (ref 0.1–1.0)
Monocytes Relative: 7 %
Neutro Abs: 3.2 10*3/uL (ref 1.7–7.7)
Neutrophils Relative %: 55 %
Platelets: 259 10*3/uL (ref 150–400)
RBC: 4.66 MIL/uL (ref 3.87–5.11)
RDW: 12.6 % (ref 11.5–15.5)
WBC: 5.9 10*3/uL (ref 4.0–10.5)
nRBC: 0 % (ref 0.0–0.2)

## 2019-05-17 LAB — COMPREHENSIVE METABOLIC PANEL
ALT: 66 U/L — ABNORMAL HIGH (ref 0–44)
AST: 64 U/L — ABNORMAL HIGH (ref 15–41)
Albumin: 3.9 g/dL (ref 3.5–5.0)
Alkaline Phosphatase: 84 U/L (ref 38–126)
Anion gap: 12 (ref 5–15)
BUN: 12 mg/dL (ref 8–23)
CO2: 28 mmol/L (ref 22–32)
Calcium: 9.1 mg/dL (ref 8.9–10.3)
Chloride: 104 mmol/L (ref 98–111)
Creatinine, Ser: 0.85 mg/dL (ref 0.44–1.00)
GFR calc Af Amer: >60 mL/min (ref >60)
GFR calc non Af Amer: >60 mL/min (ref >60)
Glucose, Bld: 95 mg/dL (ref 70–99)
Potassium: 3.5 mmol/L (ref 3.5–5.1)
Sodium: 144 mmol/L (ref 135–145)
Total Bilirubin: 0.4 mg/dL (ref 0.3–1.2)
Total Protein: 7.6 g/dL (ref 6.5–8.1)

## 2019-05-17 MED ORDER — OCTREOTIDE ACETATE 30 MG IM KIT
PACK | INTRAMUSCULAR | Status: AC
  Filled 2019-05-17: qty 1

## 2019-05-17 MED ORDER — POTASSIUM CHLORIDE CRYS ER 20 MEQ PO TBCR: 20.0000 meq | EXTENDED_RELEASE_TABLET | ORAL | 1 refills | Status: DC

## 2019-05-17 MED ORDER — OCTREOTIDE ACETATE 30 MG IM KIT
30.0000 mg | PACK | Freq: Once | INTRAMUSCULAR | Status: AC
  Administered 2019-05-17: 15:00:00 30 mg via INTRAMUSCULAR

## 2019-05-18 ENCOUNTER — Other Ambulatory Visit: Payer: Self-pay | Admitting: Hematology

## 2019-05-18 ENCOUNTER — Encounter: Payer: Self-pay | Admitting: Hematology

## 2019-05-18 ENCOUNTER — Other Ambulatory Visit: Payer: Self-pay | Admitting: *Deleted

## 2019-05-18 ENCOUNTER — Telehealth: Payer: Self-pay | Admitting: Hematology

## 2019-05-18 LAB — CHROMOGRANIN A: Chromogranin A (ng/mL): 274 ng/mL — ABNORMAL HIGH (ref 0.0–101.8)

## 2019-05-18 MED ORDER — POTASSIUM CHLORIDE CRYS ER 20 MEQ PO TBCR: 20.0000 meq | EXTENDED_RELEASE_TABLET | ORAL | 1 refills | Status: DC

## 2019-05-18 NOTE — Telephone Encounter (Signed)
Scheduled appt per 11/2 los.  Sent a staff message to get a calendar mailed out with central radiology number attached to it.

## 2019-06-04 ENCOUNTER — Telehealth: Payer: Self-pay | Admitting: Hematology

## 2019-06-04 NOTE — Telephone Encounter (Signed)
Scheduled appt per 11/20 sch message - unable to reach pt left message with appt date and time

## 2019-06-07 ENCOUNTER — Telehealth: Payer: Self-pay | Admitting: Hematology

## 2019-06-07 NOTE — Telephone Encounter (Signed)
Returned patient's phone call regarding rescheduling 12/04 and 02/03 appointments, per patient's request appointment has moved to 11/30 and 02/01.

## 2019-06-08 ENCOUNTER — Telehealth: Payer: Self-pay | Admitting: Hematology

## 2019-06-08 NOTE — Telephone Encounter (Signed)
Returned patient's phone call regarding rescheduling 01/04 appointment, per patient's request appointment has been moved to 12/31 due to patient's insurance.

## 2019-06-14 ENCOUNTER — Inpatient Hospital Stay: Payer: 59

## 2019-06-14 ENCOUNTER — Other Ambulatory Visit: Payer: Self-pay

## 2019-06-14 ENCOUNTER — Telehealth: Payer: Self-pay

## 2019-06-14 ENCOUNTER — Other Ambulatory Visit: Payer: Self-pay | Admitting: Hematology

## 2019-06-14 VITALS — BP 146/81 | HR 77 | Temp 98.7°F | Resp 18

## 2019-06-14 DIAGNOSIS — C7A012 Malignant carcinoid tumor of the ileum: Secondary | ICD-10-CM

## 2019-06-14 DIAGNOSIS — C7B09 Secondary carcinoid tumors of other sites: Secondary | ICD-10-CM

## 2019-06-14 LAB — CMP (CANCER CENTER ONLY)
ALT: 40 U/L (ref 0–44)
AST: 42 U/L — ABNORMAL HIGH (ref 15–41)
Albumin: 4.1 g/dL (ref 3.5–5.0)
Alkaline Phosphatase: 68 U/L (ref 38–126)
Anion gap: 10 (ref 5–15)
BUN: 15 mg/dL (ref 8–23)
CO2: 29 mmol/L (ref 22–32)
Calcium: 9 mg/dL (ref 8.9–10.3)
Chloride: 104 mmol/L (ref 98–111)
Creatinine: 0.88 mg/dL (ref 0.44–1.00)
GFR, Est AFR Am: >60 mL/min (ref >60)
GFR, Estimated: >60 mL/min (ref >60)
Glucose, Bld: 100 mg/dL — ABNORMAL HIGH (ref 70–99)
Potassium: 3.8 mmol/L (ref 3.5–5.1)
Sodium: 143 mmol/L (ref 135–145)
Total Bilirubin: 0.4 mg/dL (ref 0.3–1.2)
Total Protein: 7.4 g/dL (ref 6.5–8.1)

## 2019-06-14 LAB — CBC WITH DIFFERENTIAL (CANCER CENTER ONLY)
Abs Immature Granulocytes: 0.01 10*3/uL (ref 0.00–0.07)
Basophils Absolute: 0.1 10*3/uL (ref 0.0–0.1)
Basophils Relative: 1 %
Eosinophils Absolute: 0 10*3/uL (ref 0.0–0.5)
Eosinophils Relative: 1 %
HCT: 44.3 % (ref 36.0–46.0)
Hemoglobin: 14 g/dL (ref 12.0–15.0)
Immature Granulocytes: 0 %
Lymphocytes Relative: 40 %
Lymphs Abs: 2.1 10*3/uL (ref 0.7–4.0)
MCH: 30 pg (ref 26.0–34.0)
MCHC: 31.6 g/dL (ref 30.0–36.0)
MCV: 95.1 fL (ref 80.0–100.0)
Monocytes Absolute: 0.4 10*3/uL (ref 0.1–1.0)
Monocytes Relative: 7 %
Neutro Abs: 2.7 10*3/uL (ref 1.7–7.7)
Neutrophils Relative %: 51 %
Platelet Count: 248 10*3/uL (ref 150–400)
RBC: 4.66 MIL/uL (ref 3.87–5.11)
RDW: 12.9 % (ref 11.5–15.5)
WBC Count: 5.3 10*3/uL (ref 4.0–10.5)
nRBC: 0 % (ref 0.0–0.2)

## 2019-06-14 MED ORDER — OCTREOTIDE ACETATE 30 MG IM KIT
30.0000 mg | PACK | Freq: Once | INTRAMUSCULAR | Status: AC
  Administered 2019-06-14: 30 mg via INTRAMUSCULAR
  Filled 2019-06-14: qty 1

## 2019-06-14 MED ORDER — OCTREOTIDE ACETATE 30 MG IM KIT
PACK | INTRAMUSCULAR | Status: AC
  Filled 2019-06-14: qty 1

## 2019-06-14 NOTE — Telephone Encounter (Signed)
Faxed prescription clarification on Potassium to [facility], should be Klor Con 20 meq.  This was faxed to [phone], received confirmation fax went through, sent to HIM for scanning to chart.

## 2019-06-14 NOTE — Patient Instructions (Signed)
Octreotide injection solution °What is this medicine? °OCTREOTIDE (ok TREE oh tide) is used to reduce blood levels of growth hormone in patients with a condition called acromegaly. This medicine also reduces flushing and watery diarrhea caused by certain types of cancer. °This medicine may be used for other purposes; ask your health care provider or pharmacist if you have questions. °COMMON BRAND NAME(S): Bynfezia, Sandostatin, Sandostatin LAR °What should I tell my health care provider before I take this medicine? °They need to know if you have any of these conditions: °· diabetes °· gallbladder disease °· kidney disease °· liver disease °· an unusual or allergic reaction to octreotide, other medicines, foods, dyes, or preservatives °· pregnant or trying to get pregnant °· breast-feeding °How should I use this medicine? °This medicine is for injection under the skin or into a vein (only in emergency situations). It is usually given by a health care professional in a hospital or clinic setting. °If you get this medicine at home, you will be taught how to prepare and give this medicine. Allow the injection solution to come to room temperature before use. Do not warm it artificially. Use exactly as directed. Take your medicine at regular intervals. Do not take your medicine more often than directed. °It is important that you put your used needles and syringes in a special sharps container. Do not put them in a trash can. If you do not have a sharps container, call your pharmacist or healthcare provider to get one. °Talk to your pediatrician regarding the use of this medicine in children. Special care may be needed. °Overdosage: If you think you have taken too much of this medicine contact a poison control center or emergency room at once. °NOTE: This medicine is only for you. Do not share this medicine with others. °What if I miss a dose? °If you miss a dose, take it as soon as you can. If it is almost time for your  next dose, take only that dose. Do not take double or extra doses. °What may interact with this medicine? °Do not take this medicine with any of the following medications: °· cisapride °· droperidol °· general anesthetics °· grepafloxacin °· perphenazine °· thioridazine °This medicine may also interact with the following medications: °· bromocriptine °· cyclosporine °· diuretics °· medicines for blood pressure, heart disease, irregular heart beat °· medicines for diabetes, including insulin °· quinidine °This list may not describe all possible interactions. Give your health care provider a list of all the medicines, herbs, non-prescription drugs, or dietary supplements you use. Also tell them if you smoke, drink alcohol, or use illegal drugs. Some items may interact with your medicine. °What should I watch for while using this medicine? °Visit your doctor or health care professional for regular checks on your progress. °To help reduce irritation at the injection site, use a different site for each injection and make sure the solution is at room temperature before use. °This medicine may cause decreases in blood sugar. Signs of low blood sugar include chills, cool, pale skin or cold sweats, drowsiness, extreme hunger, fast heartbeat, headache, nausea, nervousness or anxiety, shakiness, trembling, unsteadiness, tiredness, or weakness. Contact your doctor or health care professional right away if you experience any of these symptoms. °This medicine may increase blood sugar. Ask your healthcare provider if changes in diet or medicines are needed if you have diabetes. °This medicine may cause a decrease in vitamin B12. You should make sure that you get enough vitamin B12   while you are taking this medicine. Discuss the foods you eat and the vitamins you take with your health care professional. °What side effects may I notice from receiving this medicine? °Side effects that you should report to your doctor or health care  professional as soon as possible: °· allergic reactions like skin rash, itching or hives, swelling of the face, lips, or tongue °· decreases in blood sugar °· changes in heart rate °· severe stomach pain °·  °signs and symptoms of high blood sugar such as being more thirsty or hungry or having to urinate more than normal. You may also feel very tired or have blurry vision. °Side effects that usually do not require medical attention (report to your doctor or health care professional if they continue or are bothersome): °· diarrhea or constipation °· gas or stomach pain °· nausea, vomiting °· pain, redness, swelling and irritation at site where injected °This list may not describe all possible side effects. Call your doctor for medical advice about side effects. You may report side effects to FDA at 1-800-FDA-1088. °Where should I keep my medicine? °Keep out of the reach of children. °Store in a refrigerator between 2 and 8 degrees C (36 and 46 degrees F). Protect from light. Allow to come to room temperature naturally. Do not use artificial heat. If protected from light, the injection may be stored at room temperature between 20 and 30 degrees C (70 and 86 degrees F) for 14 days. After the initial use, throw away any unused portion of a multiple dose vial after 14 days. Throw away unused portions of the ampules after use. °NOTE: This sheet is a summary. It may not cover all possible information. If you have questions about this medicine, talk to your doctor, pharmacist, or health care provider. °© 2020 [employer] (2018-04-09 08:07:09) ° °

## 2019-06-16 ENCOUNTER — Other Ambulatory Visit: Payer: 59

## 2019-06-16 ENCOUNTER — Ambulatory Visit: Payer: 59

## 2019-06-23 LAB — CHROMOGRANIN A: Chromogranin A (ng/mL): 250.7 ng/mL — ABNORMAL HIGH (ref 0.0–101.8)

## 2019-06-25 ENCOUNTER — Encounter: Payer: Self-pay | Admitting: Hematology

## 2019-06-25 ENCOUNTER — Telehealth: Payer: Self-pay | Admitting: Hematology

## 2019-06-25 NOTE — Telephone Encounter (Signed)
Scheduled appt per 12/11 sch message - unable to reach pt . Left message with appt date and time

## 2019-07-12 ENCOUNTER — Other Ambulatory Visit: Payer: 59

## 2019-07-12 ENCOUNTER — Encounter (HOSPITAL_COMMUNITY): Payer: Self-pay

## 2019-07-12 ENCOUNTER — Ambulatory Visit (HOSPITAL_COMMUNITY)
Admission: RE | Admit: 2019-07-12 | Discharge: 2019-07-12 | Disposition: A | Discharge status: Home / Self Care | Payer: 59 | Source: Ambulatory Visit | Attending: Hematology | Admitting: Hematology

## 2019-07-12 ENCOUNTER — Other Ambulatory Visit: Payer: Self-pay

## 2019-07-12 DIAGNOSIS — C7B09 Secondary carcinoid tumors of other sites: Secondary | ICD-10-CM | POA: Diagnosis not present

## 2019-07-12 MED ORDER — IOHEXOL 300 MG/ML  SOLN
100.0000 mL | Freq: Once | INTRAMUSCULAR | Status: AC | PRN
  Administered 2019-07-12: 100 mL via INTRAVENOUS

## 2019-07-12 MED ORDER — SODIUM CHLORIDE (PF) 0.9 % IJ SOLN
INTRAMUSCULAR | Status: AC
  Filled 2019-07-12: qty 50

## 2019-07-15 ENCOUNTER — Inpatient Hospital Stay: Payer: 59 | Attending: Hematology

## 2019-07-15 ENCOUNTER — Ambulatory Visit: Payer: 59 | Admitting: Hematology

## 2019-07-15 ENCOUNTER — Other Ambulatory Visit: Payer: Self-pay

## 2019-07-15 VITALS — BP 143/87 | HR 82 | Temp 98.5°F | Resp 18

## 2019-07-15 DIAGNOSIS — C7A012 Malignant carcinoid tumor of the ileum: Secondary | ICD-10-CM | POA: Diagnosis not present

## 2019-07-15 DIAGNOSIS — C7B09 Secondary carcinoid tumors of other sites: Secondary | ICD-10-CM

## 2019-07-15 DIAGNOSIS — Z79899 Other long term (current) drug therapy: Secondary | ICD-10-CM | POA: Diagnosis not present

## 2019-07-15 MED ORDER — OCTREOTIDE ACETATE 30 MG IM KIT
30.0000 mg | PACK | Freq: Once | INTRAMUSCULAR | Status: AC
  Administered 2019-07-15: 14:00:00 30 mg via INTRAMUSCULAR

## 2019-07-15 MED ORDER — OCTREOTIDE ACETATE 30 MG IM KIT
PACK | INTRAMUSCULAR | Status: AC
  Filled 2019-07-15: qty 1

## 2019-07-15 NOTE — Patient Instructions (Signed)
Octreotide injection solution What is this medicine? OCTREOTIDE (ok TREE oh tide) is used to reduce blood levels of growth hormone in patients with a condition called acromegaly. This medicine also reduces flushing and watery diarrhea caused by certain types of cancer. This medicine may be used for other purposes; ask your health care provider or pharmacist if you have questions. COMMON BRAND NAME(S): Bynfezia, Sandostatin What should I tell my health care provider before I take this medicine? They need to know if you have any of these conditions:  diabetes  gallbladder disease  kidney disease  liver disease  thyroid disease  an unusual or allergic reaction to octreotide, other medicines, foods, dyes, or preservatives  pregnant or trying to get pregnant  breast-feeding How should I use this medicine? This medicine is for injection under the skin or into a vein (only in emergency situations). It is usually given by a health care professional in a hospital or clinic setting. If you get this medicine at home, you will be taught how to prepare and give this medicine. Allow the injection solution to come to room temperature before use. Do not warm it artificially. Use exactly as directed. Take your medicine at regular intervals. Do not take your medicine more often than directed. It is important that you put your used needles and syringes in a special sharps container. Do not put them in a trash can. If you do not have a sharps container, call your pharmacist or healthcare provider to get one. Talk to your pediatrician regarding the use of this medicine in children. Special care may be needed. Overdosage: If you think you have taken too much of this medicine contact a poison control center or emergency room at once. NOTE: This medicine is only for you. Do not share this medicine with others. What if I miss a dose? If you miss a dose, take it as soon as you can. If it is almost time for your  next dose, take only that dose. Do not take double or extra doses. What may interact with this medicine?  bromocriptine  certain medicines for blood pressure, heart disease, irregular heartbeat  cyclosporine  diuretics  medicines for diabetes, including insulin  quinidine This list may not describe all possible interactions. Give your health care provider a list of all the medicines, herbs, non-prescription drugs, or dietary supplements you use. Also tell them if you smoke, drink alcohol, or use illegal drugs. Some items may interact with your medicine. What should I watch for while using this medicine? Visit your doctor or health care professional for regular checks on your progress. To help reduce irritation at the injection site, use a different site for each injection and make sure the solution is at room temperature before use. This medicine may cause decreases in blood sugar. Signs of low blood sugar include chills, cool, pale skin or cold sweats, drowsiness, extreme hunger, fast heartbeat, headache, nausea, nervousness or anxiety, shakiness, trembling, unsteadiness, tiredness, or weakness. Contact your doctor or health care professional right away if you experience any of these symptoms. This medicine may increase blood sugar. Ask your healthcare provider if changes in diet or medicines are needed if you have diabetes. This medicine may cause a decrease in vitamin B12. You should make sure that you get enough vitamin B12 while you are taking this medicine. Discuss the foods you eat and the vitamins you take with your health care professional. What side effects may I notice from receiving this medicine? Side   effects that you should report to your doctor or health care professional as soon as possible:  allergic reactions like skin rash, itching or hives, swelling of the face, lips, or tongue  fast, slow, or irregular heartbeat  right upper belly pain  severe stomach pain  signs  and symptoms of high blood sugar such as being more thirsty or hungry or having to urinate more than normal. You may also feel very tired or have blurry vision.  signs and symptoms of low blood sugar such as feeling anxious; confusion; dizziness; increased hunger; unusually weak or tired; increased sweating; shakiness; cold, clammy skin; irritable; headache; blurred vision; fast heartbeat; loss of consciousness  unusually weak or tired Side effects that usually do not require medical attention (report to your doctor or health care professional if they continue or are bothersome):  diarrhea  dizziness  gas  headache  nausea, vomiting  pain, redness, or irritation at site where injected  upset stomach This list may not describe all possible side effects. Call your doctor for medical advice about side effects. You may report side effects to FDA at 1-800-FDA-1088. Where should I keep my medicine? Keep out of the reach of children. Store in a refrigerator between 2 and 8 degrees C (36 and 46 degrees F). Protect from light. Allow to come to room temperature naturally. Do not use artificial heat. If protected from light, the injection may be stored at room temperature between 20 and 30 degrees C (70 and 86 degrees F) for 14 days. After the initial use, throw away any unused portion of a multiple dose vial after 14 days. Throw away unused portions of the ampules after use. NOTE: This sheet is a summary. It may not cover all possible information. If you have questions about this medicine, talk to your doctor, pharmacist, or health care provider.  2020 Elsevier/Gold Standard (2019-01-28 13:33:09)  

## 2019-07-16 ENCOUNTER — Encounter: Payer: Self-pay | Admitting: Hematology

## 2019-07-19 ENCOUNTER — Other Ambulatory Visit: Payer: 59

## 2019-07-19 ENCOUNTER — Ambulatory Visit: Payer: 59

## 2019-08-11 NOTE — Progress Notes (Signed)
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    08/17/2005 Surgery   Carcinoid tumor of the ileum following several years of paroxysmal abdominal pain. The patient underwent surgical resection of her carcinoid tumor on 08/17/2005. Two out of 13 lymph nodes were positive. The patient's stage was III. The patient was noted to have an elevated urinary 5HIAA level in July 2011.   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 Sandostatin LAR increased to 60 mg IM every 4 weeks on 08/27/2017 due to disease progression. Held from 12/01/2017 per pt's request, restarted at 30mg  IM every 4 weeks on 05/11/2018    10/19/2012 Imaging   CT scan of the abdomen and pelvis with IV contrast on 10/19/2012 showed generally stable disease with the exception of a decrease in the right abdominal mesenteric lymphadenopathy. There have been 2 lesions in the liver which are felt to be stable. One lesion is in the lateral segment of the left hepatic lobe measuring 14 x 14 mm. The second lesion is seen in the lateral segment adjacent to the falciform ligament and measures 9 mm.     08/20/2017 PET scan   [facility] PET  IMPRESSION: 1. Intense radiotracer activity associated with multiple hyperdense liver lesions consistent with well differentiated neuroendocrine tumor hepatic metastasis. 2. Multiple small foci peritoneal metastasis within the lower abdomen and pelvis. 3. Large mass  associated the RIGHT adnexa and small lesions in LEFT adnexa also with intense radiotracer activity consistent with neuroendocrine tumor metastasis to the adnexa. 4. No evidence of pulmonary metastasis or skeletal metastasis.   08/24/2018 Imaging   CT AP W Contrast 08/24/18  IMPRESSION: 1. Multiple metastatic lesions are unchanged from the most recent prior CT. There are multiple hypervascular liver lesions as well as omental and peritoneal masses, which show no increase in size or number from the prior CT. No evidence of new metastatic disease. 2. No acute findings. 3. Hepatic steatosis.     Imaging   A positive octreotide scan was noted coming from the right lower pelvis adjacent to the bladder on 09/06/2010. Initially this was felt to be a uterine fibroid. The patient has continued to have positive octreotide scan on 11/07/2011 and 05/06/2012. CT scans of the abdomen and pelvis were also carried out on 03/02/2010, 02/24/2012 and 06/30/2012. These scans in conjunction with an elevated 5HIAA level of 22.7 on 04/13/2012 and a chromogranin A level which had risen to 18.0 on 03/23/2012 strongly suggested that the patient's disease had recurred.    08/24/2018 Imaging   CT AP W Contrast 08/24/18  IMPRESSION: 1. Multiple metastatic lesions are unchanged from the most recent prior CT. There are multiple hypervascular liver lesions as well as omental and peritoneal masses, which show no increase in size or number from the prior CT. No evidence of new metastatic disease. 2. No acute findings. 3. Hepatic steatosis.   01/29/2019 Imaging   CT AP W Contrast 01/29/19 IMPRESSION: 1. Essentially stable  metastatic carcinoid tumor/neuroendocrine tumor. 2. Multiple round enhancing hepatic lesions unchanged. 3. Bulky peritoneal mass in the posterior cul-de-sac stable. 4. Stable small peritoneal and mesenteric metastasis. 5. No bowel obstruction. Serosal implant along the anti mesenteric border of the  cecum unchanged.   07/12/2019 Imaging   CT AP W Contrast  IMPRESSION: 1. Essentially stable appearance of the scattered hepatic and peritoneal metastatic lesions. 2. Right nephrolithiasis. 3. Aortic atherosclerosis. 4. Stable appearance of larger tumor implants along the adnexa and along the fundal and posterior margins of the uterus. 5. Mild lower lumbar spondylosis and degenerative disc disease.   Aortic Atherosclerosis (ICD10-I70.0).      CURRENT THERAPY:  Sandostatin LAR 30 mg IM every 4 weeks, started on 07/07/2012. Sandostatin LAR increased to 60 mg IM every 4 weeks on 08/27/2017 due to disease progression. Held from 12/01/2017 per pt's request, restarted at 30mg  IM every 4 weeks on 05/11/2018  INTERVAL HISTORY:  Vanessa Waller is here for a follow up and ongoing treatment. She presents to the clinic alone. She notes she is stable. She denies any new changes. She notes her diarrhea is occasional now. She feels her eating and appetite is adequate. She denies pain and her energy level is adequate.   She notes posterior right hip was painful last week but has improved.    REVIEW OF SYSTEMS:   Constitutional: Denies fevers, chills or abnormal weight loss  Eyes: Denies blurriness of vision Ears, nose, mouth, throat, and face: Denies mucositis or sore throat Respiratory: Denies cough, dyspnea or wheezes Cardiovascular: Denies palpitation, chest discomfort or lower extremity swelling Gastrointestinal:  Denies nausea, heartburn (+) occasional diarrhea  Skin: Denies abnormal skin rashes MSK: (+) Improved posterior right hip pain  Lymphatics: Denies new lymphadenopathy or easy bruising Neurological:Denies numbness, tingling or new weaknesses Behavioral/Psych: Mood is stable, no new changes  All other systems were reviewed with the patient and are negative.  MEDICAL HISTORY:  Past Medical History:  Diagnosis Date  . 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 carcinoid  tumor dx'd 08/2005  . Diverticulosis   . Hepatic steatosis 02/24/12  . History of kidney stones    x 2   . Hypopotassemia   . 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 ([place])    liver,mesentery,pelvis  . Nonspecific elevation of levels of transaminase or lactic acid dehydrogenase (LDH)   . Small bowel obstruction ([place])   . Uterine fibroid     SURGICAL HISTORY: Past Surgical History:  Procedure Laterality Date  . carcinoid tumor resection  2007   ileum  . colonscopy  not sure  . CYSTOSCOPY/URETEROSCOPY/HOLMIUM LASER/STENT PLACEMENT Right 01/16/2018   Procedure: RIGHT URETEROSCOPY/RIGHT RETROGRADE/HOLMIUM LASER/STENT PLACEMENT;  Surgeon: Ardis Hughs, MD;  Location: [facility];  Service: Urology;  Laterality: Right;  . TUBAL LIGATION      I have reviewed the social history and family history with the patient and they are unchanged from previous note.  ALLERGIES:  is allergic to cephalexin and amoxicillin.  MEDICATIONS:  Current Outpatient Medications  Medication Sig Dispense Refill  . Biotin 10 MG CAPS Take by mouth.    . calcium acetate (PHOSLO) 667 MG capsule Take by mouth every evening.     . Cholecalciferol (VITAMIN D-3) 5000 UNITS TABS Take 1 tablet by mouth every evening.     . loperamide (IMODIUM) 2 MG capsule Take by mouth as needed for diarrhea or loose stools.    . Multiple Vitamin (MULTIVITAMIN) tablet Take 1  tablet by mouth every evening.     [name] octreotide (SANDOSTATIN LAR) 30 MG injection Inject 30 mg into the muscle every 28 (twenty-eight) days.    . potassium chloride SA (KLOR-CON) 20 MEQ tablet Take 1 tablet (20 mEq total) by mouth every other day. 45 tablet 1  . pyridOXINE (B-6) 50 MG tablet Take 50 mg by mouth daily.    . vitamin B-12 (CYANOCOBALAMIN) 500 MCG tablet Take 500 mcg by mouth daily.     No current facility-administered medications for this visit.    PHYSICAL EXAMINATION: ECOG PERFORMANCE STATUS: 0 - Asymptomatic   Vitals:   08/16/19 1433  BP: (!) 159/81  Pulse: 76  Resp: 20  Temp: 98.7 F (37.1 C)  SpO2: 100%   Filed Weights   08/16/19 1433  Weight: 136 lb 14.4 oz (62.1 kg)    GENERAL:alert, no distress and comfortable SKIN: skin color, texture, turgor are normal, no rashes or significant lesions EYES: normal, Conjunctiva are pink and non-injected, sclera clear  NECK: supple, thyroid normal size, non-tender, without nodularity LYMPH:  no palpable lymphadenopathy in the cervical, axillary  LUNGS: clear to auscultation and percussion with normal breathing effort HEART: regular rate & rhythm and no murmurs and no lower extremity edema ABDOMEN:abdomen soft, non-tender and normal bowel sounds Musculoskeletal:no cyanosis of digits and no clubbing  NEURO: alert & oriented x 3 with fluent speech, no focal motor/sensory deficits  LABORATORY DATA:  I have reviewed the data as listed CBC Latest Ref Rng & Units 08/16/2019 06/14/2019 05/17/2019  WBC 4.0 - 10.5 K/uL 5.6 5.3 5.9  Hemoglobin 12.0 - 15.0 g/dL 14.0 14.0 13.9  Hematocrit 36.0 - 46.0 % 43.7 44.3 43.5  Platelets 150 - 400 K/uL 248 248 259     CMP Latest Ref Rng & Units 08/16/2019 06/14/2019 05/17/2019  Glucose 70 - 99 mg/dL 105(H) 100(H) 95  BUN 8 - 23 mg/dL 15 15 12   Creatinine 0.44 - 1.00 mg/dL 0.91 0.88 0.85  Sodium 135 - 145 mmol/L 143 143 144  Potassium 3.5 - 5.1 mmol/L 3.5 3.8 3.5  Chloride 98 - 111 mmol/L 105 104 104  CO2 22 - 32 mmol/L 28 29 28   Calcium 8.9 - 10.3 mg/dL 9.0 9.0 9.1  Total Protein 6.5 - 8.1 g/dL 7.6 7.4 7.6  Total Bilirubin 0.3 - 1.2 mg/dL 0.4 0.4 0.4  Alkaline Phos 38 - 126 U/L 78 68 84  AST 15 - 41 U/L 38 42(H) 64(H)  ALT 0 - 44 U/L 41 40 66(H)      RADIOGRAPHIC STUDIES: I have personally reviewed the radiological images as listed and agreed with the findings in the report. No results found.   ASSESSMENT & PLAN:  Vanessa Waller is a 64 y.o. female with    1. 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 She was treated with surgical resectionin 2/007.  -Sheunfortunatelyhas cancer recurrencewith metastasisto the liver, peritoneumand adnexa in late 2013. She has been on Sandostatin injections since 07/07/12. Her diarrhea very mildand she has been toleratingwell.  -Due to mild disease progression in earlier 2019, I increased her dose of Sandostatin injectiondose to 60mg , she came offSandostatin injectionafter 4/15/2019per her request, and restart 30mg  dose in 04/2018, she has been tolerating well.  -We reviewed her CT AP from 07/12/19 which shows stable disease. Will continue current treatment. Continue to monitor with scan every 6 months.  -She is clinically stable with occasional  diarrhea and adequate performance status. Labs reviewed, CBC and CMP WNL. Chromogranin A and 24 hr urine still pending. Physical exam unremarkable. Will proceed with Sandostatin injection today and continue monthly.  -F/u in 3 months    2. Hypokalemia -Probably related to her diarrhea which is now occasional.  -She is on Oral K 3 days a week.  -Has resolved previously.   3. HTN -Follow-up with primary care physician.  4. Cancer screening  -She is overdue for mammogram, last mammogram in July 2015. -She will continue follow up with her PCP and other specialists.  5. Mild hair loss   -She is concerned these symptoms are ongoing and may be related to Sandostatin injections. Will monitor.  -TSH level normal. She has been using biotin and her hair is still thinning. She will continue to manage this. Has been stable.   PLAN -She is clinically stable  -Will proceed with Sandostatininjection todayand continue every 4 weeks  -lab and F/u in 3 months    No problem-specific Assessment & Plan notes found for this encounter.   No orders of the defined types were placed in this encounter.  All questions  were answered. The patient knows to call the clinic with any problems, questions or concerns. No barriers to learning was detected. The total time spent in the appointment was 25 minutes.     Truitt Merle, MD 08/16/2019   I, Joslyn Devon, am acting as scribe for Truitt Merle, MD.   I have reviewed the above documentation for accuracy and completeness, and I agree with the above.

## 2019-08-16 ENCOUNTER — Inpatient Hospital Stay: Payer: BC Managed Care – PPO

## 2019-08-16 ENCOUNTER — Encounter: Payer: Self-pay | Admitting: Hematology

## 2019-08-16 ENCOUNTER — Inpatient Hospital Stay: Payer: BC Managed Care – PPO | Attending: Hematology

## 2019-08-16 ENCOUNTER — Ambulatory Visit: Payer: 59 | Admitting: Hematology

## 2019-08-16 ENCOUNTER — Inpatient Hospital Stay (HOSPITAL_BASED_OUTPATIENT_CLINIC_OR_DEPARTMENT_OTHER): Payer: BC Managed Care – PPO | Admitting: Hematology

## 2019-08-16 ENCOUNTER — Other Ambulatory Visit: Payer: Self-pay

## 2019-08-16 VITALS — BP 159/81 | HR 76 | Temp 98.7°F | Resp 20 | Ht 61.0 in | Wt 136.9 lb

## 2019-08-16 DIAGNOSIS — I700 Atherosclerosis of aorta: Secondary | ICD-10-CM | POA: Insufficient documentation

## 2019-08-16 DIAGNOSIS — C7B09 Secondary carcinoid tumors of other sites: Secondary | ICD-10-CM

## 2019-08-16 DIAGNOSIS — Z79899 Other long term (current) drug therapy: Secondary | ICD-10-CM | POA: Diagnosis not present

## 2019-08-16 DIAGNOSIS — K760 Fatty (change of) liver, not elsewhere classified: Secondary | ICD-10-CM | POA: Diagnosis not present

## 2019-08-16 DIAGNOSIS — C7A012 Malignant carcinoid tumor of the ileum: Secondary | ICD-10-CM | POA: Diagnosis not present

## 2019-08-16 DIAGNOSIS — C786 Secondary malignant neoplasm of retroperitoneum and peritoneum: Secondary | ICD-10-CM | POA: Diagnosis not present

## 2019-08-16 DIAGNOSIS — M5136 Other intervertebral disc degeneration, lumbar region: Secondary | ICD-10-CM | POA: Diagnosis not present

## 2019-08-16 DIAGNOSIS — E876 Hypokalemia: Secondary | ICD-10-CM | POA: Diagnosis not present

## 2019-08-16 DIAGNOSIS — N200 Calculus of kidney: Secondary | ICD-10-CM | POA: Insufficient documentation

## 2019-08-16 DIAGNOSIS — I10 Essential (primary) hypertension: Secondary | ICD-10-CM | POA: Diagnosis not present

## 2019-08-16 DIAGNOSIS — C787 Secondary malignant neoplasm of liver and intrahepatic bile duct: Secondary | ICD-10-CM | POA: Insufficient documentation

## 2019-08-16 DIAGNOSIS — R197 Diarrhea, unspecified: Secondary | ICD-10-CM | POA: Diagnosis not present

## 2019-08-16 LAB — CBC WITH DIFFERENTIAL/PLATELET
Abs Immature Granulocytes: 0.01 10*3/uL (ref 0.00–0.07)
Basophils Absolute: 0.1 10*3/uL (ref 0.0–0.1)
Basophils Relative: 1 %
Eosinophils Absolute: 0.1 10*3/uL (ref 0.0–0.5)
Eosinophils Relative: 1 %
HCT: 43.7 % (ref 36.0–46.0)
Hemoglobin: 14 g/dL (ref 12.0–15.0)
Immature Granulocytes: 0 %
Lymphocytes Relative: 38 %
Lymphs Abs: 2.2 10*3/uL (ref 0.7–4.0)
MCH: 29.5 pg (ref 26.0–34.0)
MCHC: 32 g/dL (ref 30.0–36.0)
MCV: 92 fL (ref 80.0–100.0)
Monocytes Absolute: 0.4 10*3/uL (ref 0.1–1.0)
Monocytes Relative: 7 %
Neutro Abs: 3 10*3/uL (ref 1.7–7.7)
Neutrophils Relative %: 53 %
Platelets: 248 10*3/uL (ref 150–400)
RBC: 4.75 MIL/uL (ref 3.87–5.11)
RDW: 12.8 % (ref 11.5–15.5)
WBC: 5.6 10*3/uL (ref 4.0–10.5)
nRBC: 0 % (ref 0.0–0.2)

## 2019-08-16 LAB — COMPREHENSIVE METABOLIC PANEL
ALT: 41 U/L (ref 0–44)
AST: 38 U/L (ref 15–41)
Albumin: 4.2 g/dL (ref 3.5–5.0)
Alkaline Phosphatase: 78 U/L (ref 38–126)
Anion gap: 10 (ref 5–15)
BUN: 15 mg/dL (ref 8–23)
CO2: 28 mmol/L (ref 22–32)
Calcium: 9 mg/dL (ref 8.9–10.3)
Chloride: 105 mmol/L (ref 98–111)
Creatinine, Ser: 0.91 mg/dL (ref 0.44–1.00)
GFR calc Af Amer: >60 mL/min (ref >60)
GFR calc non Af Amer: >60 mL/min (ref >60)
Glucose, Bld: 105 mg/dL — ABNORMAL HIGH (ref 70–99)
Potassium: 3.5 mmol/L (ref 3.5–5.1)
Sodium: 143 mmol/L (ref 135–145)
Total Bilirubin: 0.4 mg/dL (ref 0.3–1.2)
Total Protein: 7.6 g/dL (ref 6.5–8.1)

## 2019-08-16 MED ORDER — OCTREOTIDE ACETATE 30 MG IM KIT
PACK | INTRAMUSCULAR | Status: AC
  Filled 2019-08-16: qty 1

## 2019-08-16 MED ORDER — OCTREOTIDE ACETATE 30 MG IM KIT
30.0000 mg | PACK | Freq: Once | INTRAMUSCULAR | Status: AC
  Administered 2019-08-16: 30 mg via INTRAMUSCULAR

## 2019-08-16 NOTE — Patient Instructions (Signed)
Octreotide injection solution °What is this medicine? °OCTREOTIDE (ok TREE oh tide) is used to reduce blood levels of growth hormone in patients with a condition called acromegaly. This medicine also reduces flushing and watery diarrhea caused by certain types of cancer. °This medicine may be used for other purposes; ask your health care provider or pharmacist if you have questions. °COMMON BRAND NAME(S): Bynfezia, Sandostatin, Sandostatin LAR °What should I tell my health care provider before I take this medicine? °They need to know if you have any of these conditions: °· diabetes °· gallbladder disease °· kidney disease °· liver disease °· an unusual or allergic reaction to octreotide, other medicines, foods, dyes, or preservatives °· pregnant or trying to get pregnant °· breast-feeding °How should I use this medicine? °This medicine is for injection under the skin or into a vein (only in emergency situations). It is usually given by a health care professional in a hospital or clinic setting. °If you get this medicine at home, you will be taught how to prepare and give this medicine. Allow the injection solution to come to room temperature before use. Do not warm it artificially. Use exactly as directed. Take your medicine at regular intervals. Do not take your medicine more often than directed. °It is important that you put your used needles and syringes in a special sharps container. Do not put them in a trash can. If you do not have a sharps container, call your pharmacist or healthcare provider to get one. °Talk to your pediatrician regarding the use of this medicine in children. Special care may be needed. °Overdosage: If you think you have taken too much of this medicine contact a poison control center or emergency room at once. °NOTE: This medicine is only for you. Do not share this medicine with others. °What if I miss a dose? °If you miss a dose, take it as soon as you can. If it is almost time for your  next dose, take only that dose. Do not take double or extra doses. °What may interact with this medicine? °Do not take this medicine with any of the following medications: °· cisapride °· droperidol °· general anesthetics °· grepafloxacin °· perphenazine °· thioridazine °This medicine may also interact with the following medications: °· bromocriptine °· cyclosporine °· diuretics °· medicines for blood pressure, heart disease, irregular heart beat °· medicines for diabetes, including insulin °· quinidine °This list may not describe all possible interactions. Give your health care provider a list of all the medicines, herbs, non-prescription drugs, or dietary supplements you use. Also tell them if you smoke, drink alcohol, or use illegal drugs. Some items may interact with your medicine. °What should I watch for while using this medicine? °Visit your doctor or health care professional for regular checks on your progress. °To help reduce irritation at the injection site, use a different site for each injection and make sure the solution is at room temperature before use. °This medicine may cause decreases in blood sugar. Signs of low blood sugar include chills, cool, pale skin or cold sweats, drowsiness, extreme hunger, fast heartbeat, headache, nausea, nervousness or anxiety, shakiness, trembling, unsteadiness, tiredness, or weakness. Contact your doctor or health care professional right away if you experience any of these symptoms. °This medicine may increase blood sugar. Ask your healthcare provider if changes in diet or medicines are needed if you have diabetes. °This medicine may cause a decrease in vitamin B12. You should make sure that you get enough vitamin B12   while you are taking this medicine. Discuss the foods you eat and the vitamins you take with your health care professional. °What side effects may I notice from receiving this medicine? °Side effects that you should report to your doctor or health care  professional as soon as possible: °· allergic reactions like skin rash, itching or hives, swelling of the face, lips, or tongue °· decreases in blood sugar °· changes in heart rate °· severe stomach pain °·  °signs and symptoms of high blood sugar such as being more thirsty or hungry or having to urinate more than normal. You may also feel very tired or have blurry vision. °Side effects that usually do not require medical attention (report to your doctor or health care professional if they continue or are bothersome): °· diarrhea or constipation °· gas or stomach pain °· nausea, vomiting °· pain, redness, swelling and irritation at site where injected °This list may not describe all possible side effects. Call your doctor for medical advice about side effects. You may report side effects to FDA at 1-800-FDA-1088. °Where should I keep my medicine? °Keep out of the reach of children. °Store in a refrigerator between 2 and 8 degrees C (36 and 46 degrees F). Protect from light. Allow to come to room temperature naturally. Do not use artificial heat. If protected from light, the injection may be stored at room temperature between 20 and 30 degrees C (70 and 86 degrees F) for 14 days. After the initial use, throw away any unused portion of a multiple dose vial after 14 days. Throw away unused portions of the ampules after use. °NOTE: This sheet is a summary. It may not cover all possible information. If you have questions about this medicine, talk to your doctor, pharmacist, or health care provider. °© 2020 [employer] (2018-04-09 08:07:09) ° °

## 2019-08-17 ENCOUNTER — Telehealth: Payer: Self-pay | Admitting: Hematology

## 2019-08-17 LAB — CHROMOGRANIN A: Chromogranin A (ng/mL): 277.5 ng/mL — ABNORMAL HIGH (ref 0.0–101.8)

## 2019-08-17 NOTE — Telephone Encounter (Signed)
Scheduled appt per 2/1 los.  Sent a message to HIM pool to get a calendar mailed out. 

## 2019-08-18 ENCOUNTER — Ambulatory Visit: Payer: 59 | Admitting: Hematology

## 2019-08-18 ENCOUNTER — Ambulatory Visit: Payer: 59

## 2019-08-18 ENCOUNTER — Other Ambulatory Visit: Payer: 59

## 2019-08-19 ENCOUNTER — Ambulatory Visit: Payer: 59

## 2019-08-19 ENCOUNTER — Other Ambulatory Visit: Payer: 59

## 2019-09-13 ENCOUNTER — Telehealth: Payer: Self-pay

## 2019-09-13 ENCOUNTER — Inpatient Hospital Stay: Payer: BC Managed Care – PPO

## 2019-09-13 DIAGNOSIS — C7A012 Malignant carcinoid tumor of the ileum: Secondary | ICD-10-CM | POA: Diagnosis not present

## 2019-09-13 DIAGNOSIS — Z79899 Other long term (current) drug therapy: Secondary | ICD-10-CM | POA: Diagnosis not present

## 2019-09-13 NOTE — Telephone Encounter (Signed)
TC to Pt. To cancel her appointment today. Per Velna Hatchet in pharmacy her insurance company has not yet approved for her to get her injections. Pt. verbalized understanding.

## 2019-09-14 ENCOUNTER — Encounter: Payer: Self-pay | Admitting: Hematology

## 2019-09-14 ENCOUNTER — Telehealth: Payer: Self-pay | Admitting: Hematology

## 2019-09-14 NOTE — Telephone Encounter (Signed)
R/s appt per 3/2 sch message - pt aware of appt date and time

## 2019-09-18 LAB — 5 HIAA, QUANTITATIVE, URINE, 24 HOUR
5-HIAA, Ur: 16.7 mg/L
5-HIAA,Quant.,24 Hr Urine: 18.4 mg/24 hr — ABNORMAL HIGH (ref 0.0–14.9)
Total Volume: 1100

## 2019-09-20 ENCOUNTER — Other Ambulatory Visit: Payer: Self-pay

## 2019-09-20 ENCOUNTER — Encounter: Payer: Self-pay | Admitting: Hematology

## 2019-09-20 ENCOUNTER — Inpatient Hospital Stay: Payer: BC Managed Care – PPO | Attending: Hematology

## 2019-09-20 VITALS — BP 148/78 | HR 72 | Temp 98.2°F | Resp 18

## 2019-09-20 DIAGNOSIS — C7A012 Malignant carcinoid tumor of the ileum: Secondary | ICD-10-CM

## 2019-09-20 DIAGNOSIS — C7B09 Secondary carcinoid tumors of other sites: Secondary | ICD-10-CM

## 2019-09-20 DIAGNOSIS — Z79899 Other long term (current) drug therapy: Secondary | ICD-10-CM | POA: Diagnosis not present

## 2019-09-20 MED ORDER — OCTREOTIDE ACETATE 30 MG IM KIT
30.0000 mg | PACK | Freq: Once | INTRAMUSCULAR | Status: AC
  Administered 2019-09-20: 30 mg via INTRAMUSCULAR

## 2019-09-20 MED ORDER — OCTREOTIDE ACETATE 30 MG IM KIT
PACK | INTRAMUSCULAR | Status: AC
  Filled 2019-09-20: qty 1

## 2019-09-20 NOTE — Patient Instructions (Signed)
Octreotide injection solution °What is this medicine? °OCTREOTIDE (ok TREE oh tide) is used to reduce blood levels of growth hormone in patients with a condition called acromegaly. This medicine also reduces flushing and watery diarrhea caused by certain types of cancer. °This medicine may be used for other purposes; ask your health care provider or pharmacist if you have questions. °COMMON BRAND NAME(S): Bynfezia, Sandostatin, Sandostatin LAR °What should I tell my health care provider before I take this medicine? °They need to know if you have any of these conditions: °· diabetes °· gallbladder disease °· kidney disease °· liver disease °· an unusual or allergic reaction to octreotide, other medicines, foods, dyes, or preservatives °· pregnant or trying to get pregnant °· breast-feeding °How should I use this medicine? °This medicine is for injection under the skin or into a vein (only in emergency situations). It is usually given by a health care professional in a hospital or clinic setting. °If you get this medicine at home, you will be taught how to prepare and give this medicine. Allow the injection solution to come to room temperature before use. Do not warm it artificially. Use exactly as directed. Take your medicine at regular intervals. Do not take your medicine more often than directed. °It is important that you put your used needles and syringes in a special sharps container. Do not put them in a trash can. If you do not have a sharps container, call your pharmacist or healthcare provider to get one. °Talk to your pediatrician regarding the use of this medicine in children. Special care may be needed. °Overdosage: If you think you have taken too much of this medicine contact a poison control center or emergency room at once. °NOTE: This medicine is only for you. Do not share this medicine with others. °What if I miss a dose? °If you miss a dose, take it as soon as you can. If it is almost time for your  next dose, take only that dose. Do not take double or extra doses. °What may interact with this medicine? °Do not take this medicine with any of the following medications: °· cisapride °· droperidol °· general anesthetics °· grepafloxacin °· perphenazine °· thioridazine °This medicine may also interact with the following medications: °· bromocriptine °· cyclosporine °· diuretics °· medicines for blood pressure, heart disease, irregular heart beat °· medicines for diabetes, including insulin °· quinidine °This list may not describe all possible interactions. Give your health care provider a list of all the medicines, herbs, non-prescription drugs, or dietary supplements you use. Also tell them if you smoke, drink alcohol, or use illegal drugs. Some items may interact with your medicine. °What should I watch for while using this medicine? °Visit your doctor or health care professional for regular checks on your progress. °To help reduce irritation at the injection site, use a different site for each injection and make sure the solution is at room temperature before use. °This medicine may cause decreases in blood sugar. Signs of low blood sugar include chills, cool, pale skin or cold sweats, drowsiness, extreme hunger, fast heartbeat, headache, nausea, nervousness or anxiety, shakiness, trembling, unsteadiness, tiredness, or weakness. Contact your doctor or health care professional right away if you experience any of these symptoms. °This medicine may increase blood sugar. Ask your healthcare provider if changes in diet or medicines are needed if you have diabetes. °This medicine may cause a decrease in vitamin B12. You should make sure that you get enough vitamin B12   while you are taking this medicine. Discuss the foods you eat and the vitamins you take with your health care professional. °What side effects may I notice from receiving this medicine? °Side effects that you should report to your doctor or health care  professional as soon as possible: °· allergic reactions like skin rash, itching or hives, swelling of the face, lips, or tongue °· decreases in blood sugar °· changes in heart rate °· severe stomach pain °·  °signs and symptoms of high blood sugar such as being more thirsty or hungry or having to urinate more than normal. You may also feel very tired or have blurry vision. °Side effects that usually do not require medical attention (report to your doctor or health care professional if they continue or are bothersome): °· diarrhea or constipation °· gas or stomach pain °· nausea, vomiting °· pain, redness, swelling and irritation at site where injected °This list may not describe all possible side effects. Call your doctor for medical advice about side effects. You may report side effects to FDA at 1-800-FDA-1088. °Where should I keep my medicine? °Keep out of the reach of children. °Store in a refrigerator between 2 and 8 degrees C (36 and 46 degrees F). Protect from light. Allow to come to room temperature naturally. Do not use artificial heat. If protected from light, the injection may be stored at room temperature between 20 and 30 degrees C (70 and 86 degrees F) for 14 days. After the initial use, throw away any unused portion of a multiple dose vial after 14 days. Throw away unused portions of the ampules after use. °NOTE: This sheet is a summary. It may not cover all possible information. If you have questions about this medicine, talk to your doctor, pharmacist, or health care provider. °© 2020 [employer] (2018-04-09 08:07:09) ° °

## 2019-09-27 DIAGNOSIS — L821 Other seborrheic keratosis: Secondary | ICD-10-CM | POA: Diagnosis not present

## 2019-09-27 DIAGNOSIS — L820 Inflamed seborrheic keratosis: Secondary | ICD-10-CM | POA: Diagnosis not present

## 2019-09-27 DIAGNOSIS — M7130 Other bursal cyst, unspecified site: Secondary | ICD-10-CM | POA: Diagnosis not present

## 2019-09-27 DIAGNOSIS — D225 Melanocytic nevi of trunk: Secondary | ICD-10-CM | POA: Diagnosis not present

## 2019-09-27 DIAGNOSIS — L570 Actinic keratosis: Secondary | ICD-10-CM | POA: Diagnosis not present

## 2019-09-27 DIAGNOSIS — D1801 Hemangioma of skin and subcutaneous tissue: Secondary | ICD-10-CM | POA: Diagnosis not present

## 2019-09-29 DIAGNOSIS — N201 Calculus of ureter: Secondary | ICD-10-CM | POA: Diagnosis not present

## 2019-10-04 ENCOUNTER — Other Ambulatory Visit: Payer: Self-pay | Admitting: Urology

## 2019-10-05 DIAGNOSIS — N201 Calculus of ureter: Secondary | ICD-10-CM | POA: Diagnosis not present

## 2019-10-05 DIAGNOSIS — R8271 Bacteriuria: Secondary | ICD-10-CM | POA: Diagnosis not present

## 2019-10-08 ENCOUNTER — Encounter (HOSPITAL_BASED_OUTPATIENT_CLINIC_OR_DEPARTMENT_OTHER): Payer: Self-pay | Admitting: Urology

## 2019-10-08 ENCOUNTER — Other Ambulatory Visit: Payer: Self-pay

## 2019-10-08 NOTE — Progress Notes (Signed)
Spoke w/ via phone for pre-op interview---patient Lab needs dos----none               COVID test ------10-11-2019 300 pm Arrive at -------1030 am 4-1--2021 NPO after ----midnight-- Medications to take morning of surgery -----none Diabetic medication -----n/a Patient Special Instructions -----none Pre-Op special Istructions -----none Patient verbalized understanding of instructions that were given at this phone interview. Patient denies shortness of breath, chest pain, fever, cough a this phone interview.

## 2019-10-11 ENCOUNTER — Ambulatory Visit: Payer: Self-pay

## 2019-10-11 ENCOUNTER — Other Ambulatory Visit (HOSPITAL_COMMUNITY)
Admission: RE | Admit: 2019-10-11 | Discharge: 2019-10-11 | Disposition: A | Discharge status: Home / Self Care | Payer: BC Managed Care – PPO | Source: Ambulatory Visit | Attending: Urology | Admitting: Urology

## 2019-10-11 DIAGNOSIS — Z01812 Encounter for preprocedural laboratory examination: Secondary | ICD-10-CM | POA: Insufficient documentation

## 2019-10-11 DIAGNOSIS — Z20822 Contact with and (suspected) exposure to covid-19: Secondary | ICD-10-CM | POA: Insufficient documentation

## 2019-10-11 LAB — SARS CORONAVIRUS 2 (TAT 6-24 HRS): SARS Coronavirus 2: NEGATIVE

## 2019-10-14 ENCOUNTER — Ambulatory Visit (HOSPITAL_BASED_OUTPATIENT_CLINIC_OR_DEPARTMENT_OTHER): Payer: BC Managed Care – PPO | Admitting: Anesthesiology

## 2019-10-14 ENCOUNTER — Encounter (HOSPITAL_BASED_OUTPATIENT_CLINIC_OR_DEPARTMENT_OTHER)
Admission: RE | Disposition: A | Discharge status: Home / Self Care | Payer: Self-pay | Source: Home / Self Care | Attending: Urology

## 2019-10-14 ENCOUNTER — Encounter (HOSPITAL_BASED_OUTPATIENT_CLINIC_OR_DEPARTMENT_OTHER): Payer: Self-pay | Admitting: Urology

## 2019-10-14 ENCOUNTER — Ambulatory Visit (HOSPITAL_BASED_OUTPATIENT_CLINIC_OR_DEPARTMENT_OTHER)
Admission: RE | Admit: 2019-10-14 | Discharge: 2019-10-14 | Disposition: A | Discharge status: Home / Self Care | Payer: BC Managed Care – PPO | Attending: Urology | Admitting: Urology

## 2019-10-14 DIAGNOSIS — Z79899 Other long term (current) drug therapy: Secondary | ICD-10-CM | POA: Diagnosis not present

## 2019-10-14 DIAGNOSIS — N201 Calculus of ureter: Secondary | ICD-10-CM | POA: Diagnosis not present

## 2019-10-14 DIAGNOSIS — Z466 Encounter for fitting and adjustment of urinary device: Secondary | ICD-10-CM | POA: Diagnosis not present

## 2019-10-14 DIAGNOSIS — N202 Calculus of kidney with calculus of ureter: Secondary | ICD-10-CM | POA: Insufficient documentation

## 2019-10-14 DIAGNOSIS — Z881 Allergy status to other antibiotic agents status: Secondary | ICD-10-CM | POA: Insufficient documentation

## 2019-10-14 DIAGNOSIS — Z87442 Personal history of urinary calculi: Secondary | ICD-10-CM | POA: Diagnosis not present

## 2019-10-14 DIAGNOSIS — Z9049 Acquired absence of other specified parts of digestive tract: Secondary | ICD-10-CM | POA: Diagnosis not present

## 2019-10-14 DIAGNOSIS — Z880 Allergy status to penicillin: Secondary | ICD-10-CM | POA: Insufficient documentation

## 2019-10-14 DIAGNOSIS — Z859 Personal history of malignant neoplasm, unspecified: Secondary | ICD-10-CM | POA: Diagnosis not present

## 2019-10-14 DIAGNOSIS — N200 Calculus of kidney: Secondary | ICD-10-CM

## 2019-10-14 HISTORY — PX: CYSTOSCOPY WITH URETEROSCOPY, STONE BASKETRY AND STENT PLACEMENT: SHX6378

## 2019-10-14 SURGERY — CYSTOSCOPY, WITH CALCULUS MANIPULATION OR REMOVAL
Anesthesia: General | Site: Pelvis | Laterality: Right

## 2019-10-14 MED ORDER — KETOROLAC TROMETHAMINE 30 MG/ML IJ SOLN
INTRAMUSCULAR | Status: DC | PRN
  Administered 2019-10-14: 30 mg via INTRAVENOUS

## 2019-10-14 MED ORDER — MIDAZOLAM HCL 2 MG/2ML IJ SOLN
INTRAMUSCULAR | Status: AC
  Filled 2019-10-14: qty 2

## 2019-10-14 MED ORDER — PHENAZOPYRIDINE HCL 200 MG PO TABS
200.0000 mg | ORAL_TABLET | Freq: Three times a day (TID) | ORAL | Status: DC
  Administered 2019-10-14: 200 mg via ORAL
  Filled 2019-10-14: qty 1

## 2019-10-14 MED ORDER — PROPOFOL 10 MG/ML IV BOLUS
INTRAVENOUS | Status: DC | PRN
  Administered 2019-10-14: 150 mg via INTRAVENOUS

## 2019-10-14 MED ORDER — LIDOCAINE 2% (20 MG/ML) 5 ML SYRINGE
INTRAMUSCULAR | Status: AC
  Filled 2019-10-14: qty 5

## 2019-10-14 MED ORDER — ONDANSETRON HCL 4 MG/2ML IJ SOLN
INTRAMUSCULAR | Status: DC | PRN
  Administered 2019-10-14: 4 mg via INTRAVENOUS

## 2019-10-14 MED ORDER — CIPROFLOXACIN IN D5W 400 MG/200ML IV SOLN
400.0000 mg | INTRAVENOUS | Status: AC
  Administered 2019-10-14: 400 mg via INTRAVENOUS
  Filled 2019-10-14: qty 200

## 2019-10-14 MED ORDER — ONDANSETRON HCL 4 MG/2ML IJ SOLN
INTRAMUSCULAR | Status: AC
  Filled 2019-10-14: qty 2

## 2019-10-14 MED ORDER — IOHEXOL 300 MG/ML  SOLN
INTRAMUSCULAR | Status: DC | PRN
  Administered 2019-10-14: 14:00:00 20 mL via URETHRAL

## 2019-10-14 MED ORDER — CIPROFLOXACIN IN D5W 400 MG/200ML IV SOLN
INTRAVENOUS | Status: AC
  Filled 2019-10-14: qty 200

## 2019-10-14 MED ORDER — FENTANYL CITRATE (PF) 100 MCG/2ML IJ SOLN
25.0000 ug | INTRAMUSCULAR | Status: DC | PRN
  Filled 2019-10-14: qty 1

## 2019-10-14 MED ORDER — FENTANYL CITRATE (PF) 100 MCG/2ML IJ SOLN
INTRAMUSCULAR | Status: DC | PRN
  Administered 2019-10-14: 50 ug via INTRAVENOUS
  Administered 2019-10-14 (×2): 25 ug via INTRAVENOUS

## 2019-10-14 MED ORDER — LACTATED RINGERS IV SOLN
INTRAVENOUS | Status: DC
  Filled 2019-10-14: qty 1000

## 2019-10-14 MED ORDER — LIDOCAINE 2% (20 MG/ML) 5 ML SYRINGE
INTRAMUSCULAR | Status: DC | PRN
  Administered 2019-10-14: 80 mg via INTRAVENOUS

## 2019-10-14 MED ORDER — DEXAMETHASONE SODIUM PHOSPHATE 10 MG/ML IJ SOLN
INTRAMUSCULAR | Status: AC
  Filled 2019-10-14: qty 1

## 2019-10-14 MED ORDER — PROPOFOL 10 MG/ML IV BOLUS
INTRAVENOUS | Status: AC
  Filled 2019-10-14: qty 20

## 2019-10-14 MED ORDER — ACETAMINOPHEN 500 MG PO TABS
ORAL_TABLET | ORAL | Status: AC
  Filled 2019-10-14: qty 2

## 2019-10-14 MED ORDER — ACETAMINOPHEN 500 MG PO TABS
1000.0000 mg | ORAL_TABLET | Freq: Once | ORAL | Status: AC
  Administered 2019-10-14: 1000 mg via ORAL
  Filled 2019-10-14: qty 2

## 2019-10-14 MED ORDER — MIDAZOLAM HCL 5 MG/5ML IJ SOLN
INTRAMUSCULAR | Status: DC | PRN
  Administered 2019-10-14: 2 mg via INTRAVENOUS

## 2019-10-14 MED ORDER — SODIUM CHLORIDE 0.9 % IR SOLN
Status: DC | PRN
  Administered 2019-10-14: 3000 mL via INTRAVESICAL

## 2019-10-14 MED ORDER — PHENAZOPYRIDINE HCL 100 MG PO TABS
ORAL_TABLET | ORAL | Status: AC
  Filled 2019-10-14: qty 2

## 2019-10-14 MED ORDER — FENTANYL CITRATE (PF) 100 MCG/2ML IJ SOLN
INTRAMUSCULAR | Status: AC
  Filled 2019-10-14: qty 2

## 2019-10-14 MED ORDER — PROMETHAZINE HCL 25 MG/ML IJ SOLN
6.2500 mg | INTRAMUSCULAR | Status: DC | PRN
  Filled 2019-10-14: qty 1

## 2019-10-14 MED ORDER — CIPROFLOXACIN HCL 500 MG PO TABS: 500.0000 mg | ORAL_TABLET | Freq: Once | ORAL | 0 refills | Status: AC

## 2019-10-14 MED ORDER — PHENAZOPYRIDINE HCL 200 MG PO TABS: 200.0000 mg | ORAL_TABLET | Freq: Three times a day (TID) | ORAL | 0 refills | Status: DC | PRN

## 2019-10-14 MED ORDER — DEXAMETHASONE SODIUM PHOSPHATE 4 MG/ML IJ SOLN
INTRAMUSCULAR | Status: DC | PRN
  Administered 2019-10-14: 10 mg via INTRAVENOUS

## 2019-10-14 MED ORDER — TRAMADOL HCL 50 MG PO TABS: 50.0000 mg | ORAL_TABLET | Freq: Four times a day (QID) | ORAL | 0 refills | Status: DC | PRN

## 2019-10-14 MED ORDER — LIDOCAINE HCL URETHRAL/MUCOSAL 2 % EX GEL
CUTANEOUS | Status: DC | PRN
  Administered 2019-10-14: 1

## 2019-10-14 SURGICAL SUPPLY — 19 items
BAG DRAIN URO-CYSTO SKYTR STRL (DRAIN) ×3 IMPLANT
BASKET LASER NITINOL 1.9FR (BASKET) IMPLANT
CATH URET 5FR 28IN OPEN ENDED (CATHETERS) ×3 IMPLANT
CATH URET DUAL LUMEN 6-10FR 50 (CATHETERS) IMPLANT
CLOTH BEACON ORANGE TIMEOUT ST (SAFETY) ×3 IMPLANT
EXTRACTOR STONE 1.7FRX115CM (UROLOGICAL SUPPLIES) ×2 IMPLANT
FIBER LASER TRAC TIP (UROLOGICAL SUPPLIES) ×2 IMPLANT
GLOVE BIO SURGEON STRL SZ7.5 (GLOVE) ×3 IMPLANT
GOWN STRL REUS W/TWL XL LVL3 (GOWN DISPOSABLE) ×3 IMPLANT
GUIDEWIRE STR DUAL SENSOR (WIRE) ×5 IMPLANT
IV NS IRRIG 3000ML ARTHROMATIC (IV SOLUTION) ×6 IMPLANT
KIT TURNOVER CYSTO (KITS) ×3 IMPLANT
MANIFOLD NEPTUNE II (INSTRUMENTS) ×2 IMPLANT
NS IRRIG 500ML POUR BTL (IV SOLUTION) ×3 IMPLANT
PACK CYSTO (CUSTOM PROCEDURE TRAY) ×3 IMPLANT
STENT URET 6FRX24 CONTOUR (STENTS) ×2 IMPLANT
TUBE CONNECTING 12'X1/4 (SUCTIONS) ×1
TUBE CONNECTING 12X1/4 (SUCTIONS) ×1 IMPLANT
TUBING UROLOGY SET (TUBING) ×3 IMPLANT

## 2019-10-14 NOTE — Discharge Instructions (Signed)
DISCHARGE INSTRUCTIONS FOR KIDNEY STONE/URETERAL STENT   MEDICATIONS:  1.  Resume all your other meds from home - except do not take any extra narcotic pain meds that you may have at home.  2. Pyridium is to help with the burning/stinging when you urinate. 3. Tramadol is for moderate/severe pain, otherwise taking upto 1000 mg every 6 hours of plainTylenol will help treat your pain.   4. Take Cipro one hour prior to removal of your stent.   ACTIVITY:  1. No strenuous activity x 1week  2. No driving while on narcotic pain medications  3. Drink plenty of water  4. Continue to walk at home - you can still get blood clots when you are at home, so keep active, but don't over do it.  5. May return to work/school tomorrow or when you feel ready   BATHING:  1. You can shower and we recommend daily showers  2. You have a string coming from your urethra: The stent string is attached to your ureteral stent. Do not pull on this.   SIGNS/SYMPTOMS TO CALL:  Please call us if you have a fever greater than 101.5, uncontrolled nausea/vomiting, uncontrolled pain, dizziness, unable to urinate, bloody urine, chest pain, shortness of breath, leg swelling, leg pain, redness around wound, drainage from wound, or any other concerns or questions.   You can reach [place] at [phone].   FOLLOW-UP:  1. You have an appointment in 6 weeks with a ultrasound of your kidneys prior.  2. You have a string attached to your stent, you may remove it on April 6th. To do this, pull the strings until the stents are completely removed. You may feel an odd sensation in your back.    Post Anesthesia Home Care Instructions  Activity: Get plenty of rest for the remainder of the day. A responsible adult should stay with you for 24 hours following the procedure.  For the next 24 hours, DO NOT: -Drive a car -Paediatric nurse -Drink alcoholic beverages -Take any medication unless instructed by your physician -Make any legal  decisions or sign important papers.  Meals: Start with liquid foods such as gelatin or soup. Progress to regular foods as tolerated. Avoid greasy, spicy, heavy foods. If nausea and/or vomiting occur, drink only clear liquids until the nausea and/or vomiting subsides. Call your physician if vomiting continues.  Special Instructions/Symptoms: Your throat may feel dry or sore from the anesthesia or the breathing tube placed in your throat during surgery. If this causes discomfort, gargle with warm salt water. The discomfort should disappear within 24 hours.  If you had a scopolamine patch placed behind your ear for the management of post- operative nausea and/or vomiting:  1. The medication in the patch is effective for 72 hours, after which it should be removed.  Wrap patch in a tissue and discard in the trash. Wash hands thoroughly with soap and water. 2. You may remove the patch earlier than 72 hours if you experience unpleasant side effects which may include dry mouth, dizziness or visual disturbances. 3. Avoid touching the patch. Wash your hands with soap and water after contact with the patch.   NO ADVIL, ALEVE, MOTRIN, IBUPROFEN, UNTIL 6 PM THIS EVENING

## 2019-10-14 NOTE — Interval H&P Note (Signed)
History and Physical Interval Note:  10/14/2019 1:17 PM  Vanessa Waller  has presented today for surgery, with the diagnosis of RIGHT OBSTRUCTING STONE.  The various methods of treatment have been discussed with the patient and family. After consideration of risks, benefits and other options for treatment, the patient has consented to  Procedure(s): CYSTOSCOPY WITH RIGHT URETEROSCOPY, LASER LITHOTRIPSY STONE BASKETRY AND RIGHT  STENT PLACEMENT (Right) as a surgical intervention.  The patient's history has been reviewed, patient examined, no change in status, stable for surgery.  I have reviewed the patient's chart and labs.  Questions were answered to the patient's satisfaction.     Ardis Hughs

## 2019-10-14 NOTE — Transfer of Care (Signed)
Immediate Anesthesia Transfer of Care Note  Patient: Vanessa Waller  Procedure(s) Performed: CYSTOSCOPY WITH RIGHT URETEROSCOPY, LASER LITHOTRIPSY STONE BASKETRY AND RIGHT  STENT PLACEMENT (Right Pelvis)  Patient Location: PACU  Anesthesia Type:General  Level of Consciousness: drowsy and patient cooperative  Airway & Oxygen Therapy: Patient Spontanous Breathing and Patient connected to face mask oxygen  Post-op Assessment: Report given to RN and Post -op Vital signs reviewed and stable  Post vital signs: Reviewed and stable  Last Vitals:  Vitals Value Taken Time  BP    Temp    Pulse 77 10/14/19 1441  Resp 9 10/14/19 1441  SpO2 100 % 10/14/19 1441  Vitals shown include unvalidated device data.  Last Pain:  Vitals:   10/14/19 1009  TempSrc: Oral         Complications: No apparent anesthesia complications

## 2019-10-14 NOTE — Anesthesia Preprocedure Evaluation (Signed)
Anesthesia Evaluation  Patient identified by MRN, date of birth, ID band Patient awake    Reviewed: Allergy & Precautions, NPO status , Patient's Chart, lab work & pertinent test results  Airway Mallampati: II  TM Distance: >3 FB Neck ROM: Full    Dental  (+) Teeth Intact, Dental Advisory Given   Pulmonary neg pulmonary ROS,    Pulmonary exam normal breath sounds clear to auscultation       Cardiovascular negative cardio ROS Normal cardiovascular exam Rhythm:Regular Rate:Normal     Neuro/Psych negative neurological ROS  negative psych ROS   GI/Hepatic negative GI ROS, Neg liver ROS,   Endo/Other  negative endocrine ROS  Renal/GU RIGHT OBSTRUCTING STONE     Musculoskeletal negative musculoskeletal ROS (+)   Abdominal   Peds  Hematology negative hematology ROS (+)   Anesthesia Other Findings Day of surgery medications reviewed with the patient.  Reproductive/Obstetrics                             Anesthesia Physical Anesthesia Plan  ASA: II  Anesthesia Plan: General   Post-op Pain Management:    Induction: Intravenous  PONV Risk Score and Plan: 4 or greater and Ondansetron, Dexamethasone and Midazolam  Airway Management Planned: LMA  Additional Equipment:   Intra-op Plan:   Post-operative Plan: Extubation in OR  Informed Consent: I have reviewed the patients History and Physical, chart, labs and discussed the procedure including the risks, benefits and alternatives for the proposed anesthesia with the patient or authorized representative who has indicated his/her understanding and acceptance.     Dental advisory given  Plan Discussed with: CRNA  Anesthesia Plan Comments:         Anesthesia Quick Evaluation

## 2019-10-14 NOTE — Op Note (Signed)
Preoperative diagnosis: right ureteral calculus  Postoperative diagnosis: right ureteral calculus  Procedure:  1. Cystoscopy 2. right ureteroscopy and stone removal 3. Ureteroscopic laser lithotripsy 4. right 35F x 24 ureteral stent placement  5. right retrograde pyelography with interpretation  Surgeon: Ardis Hughs, MD  Anesthesia: General  Complications: None  Intraoperative findings: right retrograde pyelography demonstrated a filling defect within the right ureter consistent with the patient's known calculus without other abnormalities.  EBL: Minimal  Specimens: 1. right ureteral calculus  Disposition of specimens: [facility] for stone analysis  Indication: Vanessa Waller is a 64 y.o.   patient with a right ureteral stone and associated right symptoms. After reviewing the management options for treatment, the patient elected to proceed with the above surgical procedure(s). We have discussed the potential benefits and risks of the procedure, side effects of the proposed treatment, the likelihood of the patient achieving the goals of the procedure, and any potential problems that might occur during the procedure or recuperation. Informed consent has been obtained.   Description of procedure:  The patient was taken to the operating room and general anesthesia was induced.  The patient was placed in the dorsal lithotomy position, prepped and draped in the usual sterile fashion, and preoperative antibiotics were administered. A preoperative time-out was performed.   Cystourethroscopy was performed.  The patient's urethra was examined and was normal.  The bladder was then systematically examined in its entirety. There was no evidence for any bladder tumors, stones, or other mucosal pathology.    Attention then turned to the right ureteral orifice and a ureteral catheter was used to intubate the ureteral orifice.  Omnipaque contrast was injected through the  ureteral catheter and a retrograde pyelogram was performed with findings as dictated above.  A 0.38 sensor guidewire was then advanced up the right ureter into the renal pelvis under fluoroscopic guidance. The 6 Fr semirigid ureteroscope was then advanced into the ureter next to the guidewire and the calculus was identified.   The stone was then fragmented with the 200 micron holmium laser fiber on a setting of 1.0 and frequency of 20 Hz.   All stones were then removed from the ureter with an N-gage nitinol basket.  Reinspection of the ureter revealed no remaining visible stones or fragments.   Then advanced a second wire up through the semirigid ureteroscope and up into the right renal pelvis.  I remove the scope over the wire and then exchanged it for a flexible ureteroscope which I advanced up the ureter over the wire.  Once into the renal pelvis I irrigated clear and then performed ureteroscopy/pyeloscopy to identify several other stones.  These were fragmented and dusted at a setting of 20 Hz and 1.0 J.  All fragments were quite small and I was unable to basket them.  These were pushed up into the renal pelvis from the lower pole and left to pass on her own.  I then slowly backed out the scope noting no other ureteral fragments or mucosal abnormalities.  The wire was then backloaded through the cystoscope and a ureteral stent was advance over the wire using Seldinger technique.  The stent was positioned appropriately under fluoroscopic and cystoscopic guidance.  The wire was then removed with an adequate stent curl noted in the renal pelvis as well as in the bladder.  The bladder was then emptied and the procedure ended.  The patient appeared to tolerate the procedure well and without complications.  The patient  was able to be awakened and transferred to the recovery unit in satisfactory condition.   Disposition: The tether of the stent was left on and tucked inside the patient's vagina.   Instructions for removing the stent have been provided to the patient. The patient has been scheduled for followup in 6 weeks with a renal ultrasound.

## 2019-10-14 NOTE — H&P (Signed)
f/u for obstructing stone  HPI: Vanessa Waller is a 64 year-old female established patient who is here for further eval and management of an obstructing stone.  09/29/19: Vanessa Waller has a PMHX of metastatic carcinoid tumor, urolithiasis requiring ureteroscopy. She presents today with right sided flank that began last night and worsened after eating. Pain is described as stabbing and sharp. Nothing makes pain better. It feels very similar to her stones in thepast. She denies nausea and vomiting. Denies gross hematuria and fevers. She denies pain at this time. She has not seen a stone pass. Denies any changes to her voiding habits.   10/05/19: Vanessa Waller presents with the above noted history. Today she is here for stone follow up. She has not seen any stone fragments or stones pass in her urine. She is still experiencing intermittent right sided discomfort and pain however it is tolerable and infrequent. She denies gross blood, fevers, chills. She has a URS scheduled for April 4.   The patient was last seen July 21 2018.   The patient's stone is on her right side. The stone was multiple right sided renal stones. There are no additional stones within the urinary tract.   The patient has not passed their stone since her visit. The patient is complaining of flank pain. The patient underwent CT scan prior to today's appointment. The patient has been taking none for their obstructing stone.     ALLERGIES: Amoxicillin CAPS Keflex CAPS    MEDICATIONS: Flomax 0.4 mg capsule 1 capsule PO Daily As Directed  Tamsulosin Hcl  Ultram 50 mg tablet 1-2 tablet PO Q 6 H  Calcium  Hydrocodone-Acetaminophen 5 mg-325 mg tablet  Imodium A-D  Klor-Con-Ef 25 meq tablet, effervescent  Multiple Vitamins  Octreotide Acetate  Potassium  Sandostatin  Vitamin C  Vitamin D3  Zofran 4 mg tablet     GU PSH: Ureteroscopic laser litho, Right - 01/16/2018       PSH Notes: Partial Colectomy, Tubal Ligation, carcinoid  cancer    NON-GU PSH: Partial colectomy - 2015 Tubal Ligation - 2015     GU PMH: Ureteral calculus - 07/21/2018, - 01/02/2018, - 12/17/2017, - 12/01/2017, - 11/17/2017, Calculus of left ureter, - 2015 Renal and ureteral calculus - 02/27/2018 Renal calculus - 12/17/2017, - 12/01/2017, - 11/17/2017 Other microscopic hematuria, Microscopic hematuria - 2015 History of urolithiasis, History of renal calculi - 2015    NON-GU PMH: Encounter for general adult medical examination without abnormal findings, Encounter for preventive health examination - 2015 Benign carcinoid tumor of unspecified site, Carcinoid tumor - 2015    FAMILY HISTORY: Blood in the urine - Runs in Family Death of parent - Father, Mother Dementia - Mother Kidney Cancer - Runs In Family Kidney Stones - Runs In Family Renal Cell Carcinoma - Runs In Family Thyroid - Runs in Family   SOCIAL HISTORY: Marital Status: Single Preferred Language: English; Ethnicity: Not Hispanic Or Latino; Race: White Current Smoking Status: Patient has never smoked.   Tobacco Use Assessment Completed: Used Tobacco in last 30 days? Has never drank.  Drinks 3 caffeinated drinks per day. Patient's occupation Nurse, adult.     Notes: No alcohol use, Never a smoker, Single, Occupation, Caffeine use   REVIEW OF SYSTEMS:    GU Review Female:   Patient denies frequent urination, hard to postpone urination, burning /pain with urination, get up at night to urinate, leakage of urine, stream starts and stops, trouble starting your stream, have to strain to  urinate, and being pregnant.  Gastrointestinal (Upper):   Patient denies indigestion/ heartburn, nausea, and vomiting.  Gastrointestinal (Lower):   Patient denies diarrhea and constipation.  Constitutional:   Patient denies fever, night sweats, weight loss, and fatigue.  Skin:   Patient denies skin rash/ lesion and itching.  Eyes:   Patient denies blurred vision and double vision.  Ears/ Nose/ Throat:    Patient denies sore throat and sinus problems.  Hematologic/Lymphatic:   Patient denies swollen glands and easy bruising.  Cardiovascular:   Patient denies leg swelling and chest pains.  Respiratory:   Patient denies cough and shortness of breath.  Endocrine:   Patient denies excessive thirst.  Musculoskeletal:   Patient denies back pain and joint pain.  Neurological:   Patient denies headaches and dizziness.  Psychologic:   Patient denies depression and anxiety.   Notes: right flank pain    VITAL SIGNS:      10/05/2019 02:42 PM  Weight 138 lb / 62.6 kg  Height 61 in / 154.94 cm  BP 162/76 mmHg  Pulse 85 /min  Temperature 98.0 F / 36.6 C  BMI 26.1 kg/m   MULTI-SYSTEM PHYSICAL EXAMINATION:    Constitutional: Well-nourished. No physical deformities. Normally developed. Good grooming.  Neck: Neck symmetrical, not swollen. Normal tracheal position.  Respiratory: No labored breathing, no use of accessory muscles.   Cardiovascular: Normal temperature, normal extremity pulses, no swelling, no varicosities.   Lymphatic: No enlargement of neck, axillae, groin.  Skin: No paleness, no jaundice, no cyanosis. No lesion, no ulcer, no rash.  Neurologic / Psychiatric: Oriented to time, oriented to place, oriented to person. No depression, no anxiety, no agitation.  Gastrointestinal: No mass, no tenderness, no rigidity, non obese abdomen.  Musculoskeletal: Normal gait and station of head and neck.     PAST DATA REVIEWED:  Source Of History:  Patient, Medical Record Summary  Records Review:   Previous Doctor Records, Previous Hospital Records, Previous Patient Records  Urine Test Review:   Urine Culture  X-Ray Review: KUB: Reviewed Films.  [facility] Protocol: Reviewed Films.     PROCEDURES:         KUB - [id number]  A single view of the abdomen is obtained. renal shadows well visualized, there are multiple opacities noted with the right renal shadow. Along the projected path of the right  ureter there is a approximate 5-64mm opacity with the ileum at the level of L5. No visualized opacities noted within the expected course of the left ureter.       Patient confirmed No Neulasta OnPro Device.            Urinalysis w/Scope - 81001 Dipstick Dipstick Cont'd Micro  Color: Yellow Bilirubin: Neg WBC/hpf: NS (Not Seen)  Appearance: Clear Ketones: Neg RBC/hpf: 3 - 10/hpf  Specific Gravity: 1.015 Blood: 1+ Bacteria: NS (Not Seen)  pH: 5.5 Protein: Neg Cystals: NS (Not Seen)  Glucose: Neg Urobilinogen: 0.2 Casts: NS (Not Seen)    Nitrites: Neg Trichomonas: Not Present    Leukocyte Esterase: Neg Mucous: Not Present      Epithelial Cells: NS (Not Seen)      Yeast: NS (Not Seen)      Sperm: Not Present    Notes:      ASSESSMENT:      ICD-10 Details  1 GU:   Ureteral calculus - N20.1 Right, Acute, Uncomplicated   PLAN:           Orders Labs CULTURE, URINE  Document Letter(s):  Created for Patient: Clinical Summary         Notes:   KUB shows renal shadows well visualized, there are multiple opacities noted with the right renal shadow. Along the projected path of the right ureter there is a approximate 5-54mm opacity with the ileum at the level of L5. No visualized opacities noted within the expected course of the left ureter. Her urine will be sent for a precautionary culture. She has a URS scheduled with her urologist on 10/14/19. I advised she keep this appointment. She understands return precautions including fevers, worsening pain, nausea or vomting and any other concerns. She will continue tamsulosin as prescribed.

## 2019-10-14 NOTE — Anesthesia Procedure Notes (Signed)
Procedure Name: LMA Insertion Date/Time: 10/14/2019 1:28 PM Performed by: Justice Rocher, CRNA Pre-anesthesia Checklist: Patient identified, Emergency Drugs available, Suction available and Patient being monitored Patient Re-evaluated:Patient Re-evaluated prior to induction Oxygen Delivery Method: Circle system utilized Preoxygenation: Pre-oxygenation with 100% oxygen Induction Type: IV induction Ventilation: Mask ventilation without difficulty LMA: LMA inserted LMA Size: 4.0 Number of attempts: 1 Airway Equipment and Method: Bite block Placement Confirmation: positive ETCO2,  CO2 detector and breath sounds checked- equal and bilateral Tube secured with: Tape Dental Injury: Teeth and Oropharynx as per pre-operative assessment

## 2019-10-15 NOTE — Anesthesia Postprocedure Evaluation (Addendum)
Anesthesia Post Note  Patient: Vanessa Waller  Procedure(s) Performed: CYSTOSCOPY WITH RIGHT URETEROSCOPY, LASER LITHOTRIPSY STONE BASKETRY AND RIGHT  STENT PLACEMENT (Right Pelvis)     Patient location during evaluation: PACU Anesthesia Type: General Level of consciousness: awake and alert Pain management: pain level controlled Vital Signs Assessment: post-procedure vital signs reviewed and stable Respiratory status: spontaneous breathing, nonlabored ventilation and respiratory function stable Cardiovascular status: blood pressure returned to baseline and stable Postop Assessment: no apparent nausea or vomiting Anesthetic complications: no    Last Vitals:  Vitals:   10/14/19 1705 10/14/19 1708  BP: (!) 177/98 (!) 180/100  Pulse: 83   Resp: 18   Temp: (!) 36.1 C   SpO2:      Last Pain:  Vitals:   10/14/19 1705  TempSrc:   PainSc: 0-No pain                 Catalina Gravel

## 2019-10-18 ENCOUNTER — Other Ambulatory Visit: Payer: Self-pay

## 2019-10-18 ENCOUNTER — Inpatient Hospital Stay: Payer: BC Managed Care – PPO | Attending: Hematology

## 2019-10-18 VITALS — BP 148/72 | HR 78 | Temp 98.2°F | Resp 18

## 2019-10-18 DIAGNOSIS — C7A012 Malignant carcinoid tumor of the ileum: Secondary | ICD-10-CM | POA: Insufficient documentation

## 2019-10-18 DIAGNOSIS — Z79899 Other long term (current) drug therapy: Secondary | ICD-10-CM | POA: Diagnosis not present

## 2019-10-18 DIAGNOSIS — C7B09 Secondary carcinoid tumors of other sites: Secondary | ICD-10-CM

## 2019-10-18 MED ORDER — OCTREOTIDE ACETATE 30 MG IM KIT
30.0000 mg | PACK | Freq: Once | INTRAMUSCULAR | Status: AC
  Administered 2019-10-18: 15:00:00 30 mg via INTRAMUSCULAR

## 2019-10-18 NOTE — Patient Instructions (Signed)
Octreotide injection solution °What is this medicine? °OCTREOTIDE (ok TREE oh tide) is used to reduce blood levels of growth hormone in patients with a condition called acromegaly. This medicine also reduces flushing and watery diarrhea caused by certain types of cancer. °This medicine may be used for other purposes; ask your health care provider or pharmacist if you have questions. °COMMON BRAND NAME(S): Bynfezia, Sandostatin, Sandostatin LAR °What should I tell my health care provider before I take this medicine? °They need to know if you have any of these conditions: °· diabetes °· gallbladder disease °· kidney disease °· liver disease °· an unusual or allergic reaction to octreotide, other medicines, foods, dyes, or preservatives °· pregnant or trying to get pregnant °· breast-feeding °How should I use this medicine? °This medicine is for injection under the skin or into a vein (only in emergency situations). It is usually given by a health care professional in a hospital or clinic setting. °If you get this medicine at home, you will be taught how to prepare and give this medicine. Allow the injection solution to come to room temperature before use. Do not warm it artificially. Use exactly as directed. Take your medicine at regular intervals. Do not take your medicine more often than directed. °It is important that you put your used needles and syringes in a special sharps container. Do not put them in a trash can. If you do not have a sharps container, call your pharmacist or healthcare provider to get one. °Talk to your pediatrician regarding the use of this medicine in children. Special care may be needed. °Overdosage: If you think you have taken too much of this medicine contact a poison control center or emergency room at once. °NOTE: This medicine is only for you. Do not share this medicine with others. °What if I miss a dose? °If you miss a dose, take it as soon as you can. If it is almost time for your  next dose, take only that dose. Do not take double or extra doses. °What may interact with this medicine? °Do not take this medicine with any of the following medications: °· cisapride °· droperidol °· general anesthetics °· grepafloxacin °· perphenazine °· thioridazine °This medicine may also interact with the following medications: °· bromocriptine °· cyclosporine °· diuretics °· medicines for blood pressure, heart disease, irregular heart beat °· medicines for diabetes, including insulin °· quinidine °This list may not describe all possible interactions. Give your health care provider a list of all the medicines, herbs, non-prescription drugs, or dietary supplements you use. Also tell them if you smoke, drink alcohol, or use illegal drugs. Some items may interact with your medicine. °What should I watch for while using this medicine? °Visit your doctor or health care professional for regular checks on your progress. °To help reduce irritation at the injection site, use a different site for each injection and make sure the solution is at room temperature before use. °This medicine may cause decreases in blood sugar. Signs of low blood sugar include chills, cool, pale skin or cold sweats, drowsiness, extreme hunger, fast heartbeat, headache, nausea, nervousness or anxiety, shakiness, trembling, unsteadiness, tiredness, or weakness. Contact your doctor or health care professional right away if you experience any of these symptoms. °This medicine may increase blood sugar. Ask your healthcare provider if changes in diet or medicines are needed if you have diabetes. °This medicine may cause a decrease in vitamin B12. You should make sure that you get enough vitamin B12   while you are taking this medicine. Discuss the foods you eat and the vitamins you take with your health care professional. °What side effects may I notice from receiving this medicine? °Side effects that you should report to your doctor or health care  professional as soon as possible: °· allergic reactions like skin rash, itching or hives, swelling of the face, lips, or tongue °· decreases in blood sugar °· changes in heart rate °· severe stomach pain °·  °signs and symptoms of high blood sugar such as being more thirsty or hungry or having to urinate more than normal. You may also feel very tired or have blurry vision. °Side effects that usually do not require medical attention (report to your doctor or health care professional if they continue or are bothersome): °· diarrhea or constipation °· gas or stomach pain °· nausea, vomiting °· pain, redness, swelling and irritation at site where injected °This list may not describe all possible side effects. Call your doctor for medical advice about side effects. You may report side effects to FDA at 1-800-FDA-1088. °Where should I keep my medicine? °Keep out of the reach of children. °Store in a refrigerator between 2 and 8 degrees C (36 and 46 degrees F). Protect from light. Allow to come to room temperature naturally. Do not use artificial heat. If protected from light, the injection may be stored at room temperature between 20 and 30 degrees C (70 and 86 degrees F) for 14 days. After the initial use, throw away any unused portion of a multiple dose vial after 14 days. Throw away unused portions of the ampules after use. °NOTE: This sheet is a summary. It may not cover all possible information. If you have questions about this medicine, talk to your doctor, pharmacist, or health care provider. °© 2020 [employer] (2018-04-09 08:07:09) ° °

## 2019-11-08 ENCOUNTER — Ambulatory Visit: Payer: Self-pay

## 2019-11-08 ENCOUNTER — Other Ambulatory Visit: Payer: Self-pay

## 2019-11-08 ENCOUNTER — Ambulatory Visit: Payer: Self-pay | Admitting: Hematology

## 2019-11-11 NOTE — Progress Notes (Signed)
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    08/17/2005 Surgery   Carcinoid tumor of the ileum following several years of paroxysmal abdominal pain. The patient underwent surgical resection of her carcinoid tumor on 08/17/2005. Two out of 13 lymph nodes were positive. The patient's stage was III. The patient was noted to have an elevated urinary 5HIAA level in July 2011.   2013 Initial Diagnosis   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 [facility])   07/07/2012 -  Antibody Plan   Sandostatin LAR 30 mg IM every 4 weeks, started on 07/07/2012. Sandostatin LAR increased to 60 mg IM every 4 weeks on 08/27/2017 due to disease progression. Held from 12/01/2017 per pt's request, restarted at 30mg  IM every 4 weeks on 05/11/2018    10/19/2012 Imaging   CT scan of the abdomen and pelvis with IV contrast on 10/19/2012 showed generally stable disease with the exception of a decrease in the right abdominal mesenteric lymphadenopathy. There have been 2 lesions in the liver which are felt to be stable. One lesion is in the lateral segment of the left hepatic lobe measuring 14 x 14 mm. The second lesion is seen in the lateral segment adjacent to the falciform ligament and measures 9 mm.     08/20/2017 PET scan   Donatate PET  IMPRESSION: 1. Intense radiotracer activity associated with multiple hyperdense liver lesions consistent with well differentiated neuroendocrine tumor hepatic metastasis. 2. Multiple small foci peritoneal metastasis within the lower abdomen and pelvis. 3. Large mass  associated the RIGHT adnexa and small lesions in LEFT adnexa also with intense radiotracer activity consistent with neuroendocrine tumor metastasis to the adnexa. 4. No evidence of pulmonary metastasis or skeletal metastasis.   08/24/2018 Imaging   CT AP W Contrast 08/24/18  IMPRESSION: 1. Multiple metastatic lesions are unchanged from the most recent prior CT. There are multiple hypervascular liver lesions as well as omental and peritoneal masses, which show no increase in size or number from the prior CT. No evidence of new metastatic disease. 2. No acute findings. 3. Hepatic steatosis.     Imaging   A positive octreotide scan was noted coming from the right lower pelvis adjacent to the bladder on 09/06/2010. Initially this was felt to be a uterine fibroid. The patient has continued to have positive octreotide scan on 11/07/2011 and 05/06/2012. CT scans of the abdomen and pelvis were also carried out on 03/02/2010, 02/24/2012 and 06/30/2012. These scans in conjunction with an elevated 5HIAA level of 22.7 on 04/13/2012 and a chromogranin A level which had risen to 18.0 on 03/23/2012 strongly suggested that the patient's disease had recurred.    08/24/2018 Imaging   CT AP W Contrast 08/24/18  IMPRESSION: 1. Multiple metastatic lesions are unchanged from the most recent prior CT. There are multiple hypervascular liver lesions as well as omental and peritoneal masses, which show no increase in size or number from the prior CT. No evidence of new metastatic disease. 2. No acute findings. 3. Hepatic steatosis.   01/29/2019 Imaging   CT AP W Contrast 01/29/19 IMPRESSION: 1. Essentially stable  metastatic carcinoid tumor/neuroendocrine tumor. 2. Multiple round enhancing hepatic lesions unchanged. 3. Bulky peritoneal mass in the posterior cul-de-sac stable. 4. Stable small peritoneal and mesenteric metastasis. 5. No bowel obstruction. Serosal implant along the anti mesenteric border of the  cecum unchanged.   07/12/2019 Imaging   CT AP W Contrast  IMPRESSION: 1. Essentially stable appearance of the scattered hepatic and peritoneal metastatic lesions. 2. Right nephrolithiasis. 3. Aortic atherosclerosis. 4. Stable appearance of larger tumor implants along the adnexa and along the fundal and posterior margins of the uterus. 5. Mild lower lumbar spondylosis and degenerative disc disease.   Aortic Atherosclerosis (ICD10-I70.0).      CURRENT THERAPY:  Sandostatin LAR 30 mg IM every 4 weeks, started on 07/07/2012. Sandostatin LAR increased to 60 mg IM every 4 weeks on 08/27/2017 due to disease progression. Held from 12/01/2017 per pt's request, restarted at 30mg  IM every 4 weeks on 05/11/2018  INTERVAL HISTORY:  Vanessa Waller is here for a follow up and treatment. She presents to the clinic alone. She notes she is doing well. She notes her BMs are stable with occasional diarrhea. She notes her appetite and eating is adequate, weight stable. I reviewed medication list with her. She is no longer on Tramadol for her kidney stone s/p cystoscopy. She is no longer on Pyridium.     REVIEW OF SYSTEMS:   Constitutional: Denies fevers, chills or abnormal weight loss Eyes: Denies blurriness of vision Ears, nose, mouth, throat, and face: Denies mucositis or sore throat Respiratory: Denies cough, dyspnea or wheezes Cardiovascular: Denies palpitation, chest discomfort or lower extremity swelling Gastrointestinal:  Denies nausea, heartburn or change in bowel habits Skin: Denies abnormal skin rashes Lymphatics: Denies new lymphadenopathy or easy bruising Neurological:Denies numbness, tingling or new weaknesses Behavioral/Psych: Mood is stable, no new changes  All other systems were reviewed with the patient and are negative.  MEDICAL HISTORY:  Past Medical History:  Diagnosis Date  . 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 carcinoid tumor dx'd 08/2005   small interstine  .  Diverticulosis   . Hepatic steatosis 02/24/12  . History of kidney stones    x 2   . Hypopotassemia   . 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 ([place])    liver,mesentery,pelvis  . Nonspecific elevation of levels of transaminase or lactic acid dehydrogenase (LDH)   . Small bowel obstruction ([place]) 2007  . Uterine fibroid     SURGICAL HISTORY: Past Surgical History:  Procedure Laterality Date  . carcinoid tumor resection  2007   ileum  . colonscopy  not sure  . CYSTOSCOPY WITH URETEROSCOPY, STONE BASKETRY AND STENT PLACEMENT Right 10/14/2019   Procedure: CYSTOSCOPY WITH RIGHT URETEROSCOPY, LASER LITHOTRIPSY STONE BASKETRY AND RIGHT  STENT PLACEMENT;  Surgeon: Ardis Hughs, MD;  Location[facility];  Service: Urology;  Laterality: Right;  . CYSTOSCOPY/URETEROSCOPY/HOLMIUM LASER/STENT PLACEMENT Right 01/16/2018   Procedure: RIGHT URETEROSCOPY/RIGHT RETROGRADE/HOLMIUM LASER/STENT PLACEMENT;  Surgeon: Ardis Hughs, MD;  Location: [facility];  Service: Urology;  Laterality: Right;  . TUBAL LIGATION      I have reviewed the social history and family history with the patient and they are unchanged from previous note.  ALLERGIES:  is allergic to cephalexin and amoxicillin.  MEDICATIONS:  Current Outpatient Medications  Medication Sig Dispense Refill  . Biotin 10 MG CAPS Take by mouth.    . calcium acetate (PHOSLO) 667 MG capsule Take by mouth every evening.     [name]  Cholecalciferol (VITAMIN D-3) 5000 UNITS TABS Take 1 tablet by mouth every evening.     . loperamide (IMODIUM) 2 MG capsule Take by mouth as needed for diarrhea or loose stools.    . Multiple Vitamin (MULTIVITAMIN) tablet Take 1 tablet by mouth every evening.     [name] octreotide (SANDOSTATIN LAR) 30 MG injection Inject 30 mg into the muscle every 28 (twenty-eight) days.    . phenazopyridine (PYRIDIUM) 200 MG tablet Take 1 tablet (200 mg total) by mouth 3 (three) times daily as  needed for pain. 10 tablet 0  . potassium chloride SA (KLOR-CON) 20 MEQ tablet Take 1 tablet (20 mEq total) by mouth every other day. 45 tablet 1  . vitamin B-12 (CYANOCOBALAMIN) 500 MCG tablet Take 500 mcg by mouth daily.     No current facility-administered medications for this visit.    PHYSICAL EXAMINATION: ECOG PERFORMANCE STATUS: 0 - Asymptomatic  Vitals:   11/15/19 1443  BP: (!) 155/80  Pulse: 74  Resp: 18  Temp: 99.1 F (37.3 C)   Filed Weights   11/15/19 1443  Weight: 135 lb 11.2 oz (61.6 kg)    GENERAL:alert, no distress and comfortable SKIN: skin color, texture, turgor are normal, no rashes or significant lesions EYES: normal, Conjunctiva are pink and non-injected, sclera clear  NECK: supple, thyroid normal size, non-tender, without nodularity LYMPH:  no palpable lymphadenopathy in the cervical, axillary  LUNGS: clear to auscultation and percussion with normal breathing effort HEART: regular rate & rhythm and no murmurs and no lower extremity edema ABDOMEN:abdomen soft, non-tender and normal bowel sounds Musculoskeletal:no cyanosis of digits and no clubbing  NEURO: alert & oriented x 3 with fluent speech, no focal motor/sensory deficits  LABORATORY DATA:  I have reviewed the data as listed CBC Latest Ref Rng & Units 11/15/2019 08/16/2019 06/14/2019  WBC 4.0 - 10.5 K/uL 5.0 5.6 5.3  Hemoglobin 12.0 - 15.0 g/dL 13.7 14.0 14.0  Hematocrit 36.0 - 46.0 % 43.5 43.7 44.3  Platelets 150 - 400 K/uL 247 248 248     CMP Latest Ref Rng & Units 11/15/2019 08/16/2019 06/14/2019  Glucose 70 - 99 mg/dL 114(H) 105(H) 100(H)  BUN 8 - 23 mg/dL 17 15 15   Creatinine 0.44 - 1.00 mg/dL 0.92 0.91 0.88  Sodium 135 - 145 mmol/L 145 143 143  Potassium 3.5 - 5.1 mmol/L 3.8 3.5 3.8  Chloride 98 - 111 mmol/L 105 105 104  CO2 22 - 32 mmol/L 28 28 29   Calcium 8.9 - 10.3 mg/dL 9.1 9.0 9.0  Total Protein 6.5 - 8.1 g/dL 7.2 7.6 7.4  Total Bilirubin 0.3 - 1.2 mg/dL 0.5 0.4 0.4  Alkaline Phos 38  - 126 U/L 69 78 68  AST 15 - 41 U/L 38 38 42(H)  ALT 0 - 44 U/L 40 41 40      RADIOGRAPHIC STUDIES: I have personally reviewed the radiological images as listed and agreed with the findings in the report. No results found.   ASSESSMENT & PLAN:  Vanessa Waller is a 64 y.o. female with    1. 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 She was treated with surgical resectionin 2/007.  -Sheunfortunatelyhas cancer recurrencewith metastasisto the liver, peritoneumand adnexa in late 2013. She has been on Sandostatin injections since 07/07/12. Her diarrhea very mildand she has been toleratingwell.  -Due to mild disease progression in earlier 2019, I increased her dose of Sandostatin injectiondose  to 60mg , she came offSandostatin injectionafter 4/15/2019per her request, and restart 30mg  dose in 04/2018, she has been tolerating well.She had stable disease on 06/2019 CT AP.  -She is clinically doing well and stable. Physical exam showed no hepatomegaly, unremarkable. Labs reviewed, CBC and CMP WNL except BG 114. Her Chromogranin A is overall stable at moderately elevated level. She proceeded with Sandostatin injection today.  -Plan for next CT scan in July 2021. Will check her 24 hr urine 5 HIAA in 03/2020.  -F/u in 2 months with scan results. Then every 3 months.    2. Hypokalemia -Probably related to her diarrhea which is now occasional.  -She is on Oral K 3 days a week.  -Has resolved previously.   3. HTN -Follow-up with primary care physician.  4. Cancer screening  -She is overdue for mammogram, last mammogram in July 2015. -She will continue follow up with her PCP and other specialists.  5. Mild hair loss  -She is concerned these symptoms are ongoing and may be related to Sandostatin injections. Will monitor. -TSH level normal. She has been using biotin and her hair is  still thinning. She will continue to manage this.Has been stable. Not mentioned today    PLAN -She is clinically stable -Will proceed with Sandostatininjection todayand continue every 4 weeks  -Injection next month  -F/u in 2 months with lab and CT CAP W contrast a few days before.    No problem-specific Assessment & Plan notes found for this encounter.   Orders Placed This Encounter  Procedures  . CT Abdomen Pelvis W Contrast    Standing Status:   Future    Standing Expiration Date:   11/16/2020    Order Specific Question:   If indicated for the ordered procedure, I authorize the administration of contrast media per Radiology protocol    Answer:   Yes    Order Specific Question:   Preferred imaging location?    Answer:   [facility]    Order Specific Question:   Is Oral Contrast requested for this exam?    Answer:   Yes, Per Radiology protocol    Order Specific Question:   Radiology Contrast Protocol - do NOT remove file path    Answer:   \\[email or web address]  . CT Chest W Contrast    Standing Status:   Future    Standing Expiration Date:   11/16/2020    Order Specific Question:   If indicated for the ordered procedure, I authorize the administration of contrast media per Radiology protocol    Answer:   Yes    Order Specific Question:   Preferred imaging location?    Answer:   [facility]    Order Specific Question:   Radiology Contrast Protocol - do NOT remove file path    Answer:   \\[email or web address]   All questions were answered. The patient knows to call the clinic with any problems, questions or concerns. No barriers to learning was detected.      Truitt Merle, MD 11/15/2019   I, Joslyn Devon, am acting as scribe for Truitt Merle, MD.   I have reviewed the above documentation for accuracy and completeness, and I agree with the above.

## 2019-11-15 ENCOUNTER — Encounter: Payer: Self-pay | Admitting: Hematology

## 2019-11-15 ENCOUNTER — Inpatient Hospital Stay (HOSPITAL_BASED_OUTPATIENT_CLINIC_OR_DEPARTMENT_OTHER): Payer: BC Managed Care – PPO | Admitting: Hematology

## 2019-11-15 ENCOUNTER — Other Ambulatory Visit: Payer: Self-pay

## 2019-11-15 ENCOUNTER — Inpatient Hospital Stay: Payer: BC Managed Care – PPO

## 2019-11-15 ENCOUNTER — Inpatient Hospital Stay: Payer: BC Managed Care – PPO | Attending: Hematology

## 2019-11-15 VITALS — BP 155/80 | HR 74 | Temp 99.1°F | Resp 18 | Ht 61.0 in | Wt 135.7 lb

## 2019-11-15 DIAGNOSIS — C7A012 Malignant carcinoid tumor of the ileum: Secondary | ICD-10-CM | POA: Insufficient documentation

## 2019-11-15 DIAGNOSIS — Z79899 Other long term (current) drug therapy: Secondary | ICD-10-CM | POA: Diagnosis not present

## 2019-11-15 DIAGNOSIS — N200 Calculus of kidney: Secondary | ICD-10-CM | POA: Insufficient documentation

## 2019-11-15 DIAGNOSIS — C7B09 Secondary carcinoid tumors of other sites: Secondary | ICD-10-CM

## 2019-11-15 DIAGNOSIS — E876 Hypokalemia: Secondary | ICD-10-CM | POA: Insufficient documentation

## 2019-11-15 DIAGNOSIS — C786 Secondary malignant neoplasm of retroperitoneum and peritoneum: Secondary | ICD-10-CM | POA: Diagnosis not present

## 2019-11-15 DIAGNOSIS — I700 Atherosclerosis of aorta: Secondary | ICD-10-CM | POA: Diagnosis not present

## 2019-11-15 DIAGNOSIS — I10 Essential (primary) hypertension: Secondary | ICD-10-CM | POA: Insufficient documentation

## 2019-11-15 DIAGNOSIS — M5136 Other intervertebral disc degeneration, lumbar region: Secondary | ICD-10-CM | POA: Diagnosis not present

## 2019-11-15 DIAGNOSIS — Z87442 Personal history of urinary calculi: Secondary | ICD-10-CM | POA: Insufficient documentation

## 2019-11-15 DIAGNOSIS — K760 Fatty (change of) liver, not elsewhere classified: Secondary | ICD-10-CM | POA: Diagnosis not present

## 2019-11-15 LAB — CMP (CANCER CENTER ONLY)
ALT: 40 U/L (ref 0–44)
AST: 38 U/L (ref 15–41)
Albumin: 3.9 g/dL (ref 3.5–5.0)
Alkaline Phosphatase: 69 U/L (ref 38–126)
Anion gap: 12 (ref 5–15)
BUN: 17 mg/dL (ref 8–23)
CO2: 28 mmol/L (ref 22–32)
Calcium: 9.1 mg/dL (ref 8.9–10.3)
Chloride: 105 mmol/L (ref 98–111)
Creatinine: 0.92 mg/dL (ref 0.44–1.00)
GFR, Est AFR Am: >60 mL/min (ref >60)
GFR, Estimated: >60 mL/min (ref >60)
Glucose, Bld: 114 mg/dL — ABNORMAL HIGH (ref 70–99)
Potassium: 3.8 mmol/L (ref 3.5–5.1)
Sodium: 145 mmol/L (ref 135–145)
Total Bilirubin: 0.5 mg/dL (ref 0.3–1.2)
Total Protein: 7.2 g/dL (ref 6.5–8.1)

## 2019-11-15 LAB — CBC WITH DIFFERENTIAL (CANCER CENTER ONLY)
Abs Immature Granulocytes: 0.01 10*3/uL (ref 0.00–0.07)
Basophils Absolute: 0.1 10*3/uL (ref 0.0–0.1)
Basophils Relative: 1 %
Eosinophils Absolute: 0.1 10*3/uL (ref 0.0–0.5)
Eosinophils Relative: 2 %
HCT: 43.5 % (ref 36.0–46.0)
Hemoglobin: 13.7 g/dL (ref 12.0–15.0)
Immature Granulocytes: 0 %
Lymphocytes Relative: 36 %
Lymphs Abs: 1.8 10*3/uL (ref 0.7–4.0)
MCH: 29.7 pg (ref 26.0–34.0)
MCHC: 31.5 g/dL (ref 30.0–36.0)
MCV: 94.4 fL (ref 80.0–100.0)
Monocytes Absolute: 0.4 10*3/uL (ref 0.1–1.0)
Monocytes Relative: 7 %
Neutro Abs: 2.7 10*3/uL (ref 1.7–7.7)
Neutrophils Relative %: 54 %
Platelet Count: 247 10*3/uL (ref 150–400)
RBC: 4.61 MIL/uL (ref 3.87–5.11)
RDW: 12.8 % (ref 11.5–15.5)
WBC Count: 5 10*3/uL (ref 4.0–10.5)
nRBC: 0 % (ref 0.0–0.2)

## 2019-11-15 MED ORDER — OCTREOTIDE ACETATE 30 MG IM KIT
30.0000 mg | PACK | Freq: Once | INTRAMUSCULAR | Status: AC
  Administered 2019-11-15: 30 mg via INTRAMUSCULAR

## 2019-11-15 MED ORDER — OCTREOTIDE ACETATE 30 MG IM KIT
PACK | INTRAMUSCULAR | Status: AC
  Filled 2019-11-15: qty 1

## 2019-11-15 NOTE — Patient Instructions (Signed)
Octreotide injection solution What is this medicine? OCTREOTIDE (ok TREE oh tide) is used to reduce blood levels of growth hormone in patients with a condition called acromegaly. This medicine also reduces flushing and watery diarrhea caused by certain types of cancer. This medicine may be used for other purposes; ask your health care provider or pharmacist if you have questions. COMMON BRAND NAME(S): Bynfezia, Sandostatin What should I tell my health care provider before I take this medicine? They need to know if you have any of these conditions:  diabetes  gallbladder disease  kidney disease  liver disease  thyroid disease  an unusual or allergic reaction to octreotide, other medicines, foods, dyes, or preservatives  pregnant or trying to get pregnant  breast-feeding How should I use this medicine? This medicine is for injection under the skin or into a vein (only in emergency situations). It is usually given by a health care professional in a hospital or clinic setting. If you get this medicine at home, you will be taught how to prepare and give this medicine. Allow the injection solution to come to room temperature before use. Do not warm it artificially. Use exactly as directed. Take your medicine at regular intervals. Do not take your medicine more often than directed. It is important that you put your used needles and syringes in a special sharps container. Do not put them in a trash can. If you do not have a sharps container, call your pharmacist or healthcare provider to get one. Talk to your pediatrician regarding the use of this medicine in children. Special care may be needed. Overdosage: If you think you have taken too much of this medicine contact a poison control center or emergency room at once. NOTE: This medicine is only for you. Do not share this medicine with others. What if I miss a dose? If you miss a dose, take it as soon as you can. If it is almost time for your  next dose, take only that dose. Do not take double or extra doses. What may interact with this medicine?  bromocriptine  certain medicines for blood pressure, heart disease, irregular heartbeat  cyclosporine  diuretics  medicines for diabetes, including insulin  quinidine This list may not describe all possible interactions. Give your health care provider a list of all the medicines, herbs, non-prescription drugs, or dietary supplements you use. Also tell them if you smoke, drink alcohol, or use illegal drugs. Some items may interact with your medicine. What should I watch for while using this medicine? Visit your doctor or health care professional for regular checks on your progress. To help reduce irritation at the injection site, use a different site for each injection and make sure the solution is at room temperature before use. This medicine may cause decreases in blood sugar. Signs of low blood sugar include chills, cool, pale skin or cold sweats, drowsiness, extreme hunger, fast heartbeat, headache, nausea, nervousness or anxiety, shakiness, trembling, unsteadiness, tiredness, or weakness. Contact your doctor or health care professional right away if you experience any of these symptoms. This medicine may increase blood sugar. Ask your healthcare provider if changes in diet or medicines are needed if you have diabetes. This medicine may cause a decrease in vitamin B12. You should make sure that you get enough vitamin B12 while you are taking this medicine. Discuss the foods you eat and the vitamins you take with your health care professional. What side effects may I notice from receiving this medicine? Side   effects that you should report to your doctor or health care professional as soon as possible:  allergic reactions like skin rash, itching or hives, swelling of the face, lips, or tongue  fast, slow, or irregular heartbeat  right upper belly pain  severe stomach pain  signs  and symptoms of high blood sugar such as being more thirsty or hungry or having to urinate more than normal. You may also feel very tired or have blurry vision.  signs and symptoms of low blood sugar such as feeling anxious; confusion; dizziness; increased hunger; unusually weak or tired; increased sweating; shakiness; cold, clammy skin; irritable; headache; blurred vision; fast heartbeat; loss of consciousness  unusually weak or tired Side effects that usually do not require medical attention (report to your doctor or health care professional if they continue or are bothersome):  diarrhea  dizziness  gas  headache  nausea, vomiting  pain, redness, or irritation at site where injected  upset stomach This list may not describe all possible side effects. Call your doctor for medical advice about side effects. You may report side effects to FDA at 1-800-FDA-1088. Where should I keep my medicine? Keep out of the reach of children. Store in a refrigerator between 2 and 8 degrees C (36 and 46 degrees F). Protect from light. Allow to come to room temperature naturally. Do not use artificial heat. If protected from light, the injection may be stored at room temperature between 20 and 30 degrees C (70 and 86 degrees F) for 14 days. After the initial use, throw away any unused portion of a multiple dose vial after 14 days. Throw away unused portions of the ampules after use. NOTE: This sheet is a summary. It may not cover all possible information. If you have questions about this medicine, talk to your doctor, pharmacist, or health care provider.  2020 Elsevier/Gold Standard (2019-01-28 13:33:09)  

## 2019-11-16 ENCOUNTER — Telehealth: Payer: Self-pay | Admitting: Hematology

## 2019-11-16 LAB — CHROMOGRANIN A: Chromogranin A (ng/mL): 284 ng/mL — ABNORMAL HIGH (ref 0.0–101.8)

## 2019-11-16 NOTE — Telephone Encounter (Signed)
Scheduled appt per 5/3 los.  Pt did not want to schedule f/u in 2 months.  Secured chat the MD. Per the MD its okay to move it out to 3 months along with the CT scan.  Did not schedule a lab yet due to the pt, not wanting to make several trips.  Lab will be added when the CT scan is scheduled.  Pt is aware of the scheduled appt dates and time.

## 2019-11-17 ENCOUNTER — Encounter: Payer: Self-pay | Admitting: Hematology

## 2019-11-17 DIAGNOSIS — S80862A Insect bite (nonvenomous), left lower leg, initial encounter: Secondary | ICD-10-CM | POA: Diagnosis not present

## 2019-12-06 ENCOUNTER — Ambulatory Visit: Payer: Self-pay

## 2019-12-06 ENCOUNTER — Other Ambulatory Visit: Payer: Self-pay

## 2019-12-06 ENCOUNTER — Ambulatory Visit: Payer: Self-pay | Admitting: Hematology

## 2019-12-20 ENCOUNTER — Other Ambulatory Visit: Payer: Self-pay

## 2019-12-20 ENCOUNTER — Inpatient Hospital Stay: Payer: BC Managed Care – PPO | Attending: Hematology

## 2019-12-20 VITALS — BP 132/77 | HR 78 | Temp 98.2°F | Resp 18

## 2019-12-20 DIAGNOSIS — C7B09 Secondary carcinoid tumors of other sites: Secondary | ICD-10-CM

## 2019-12-20 DIAGNOSIS — C7A012 Malignant carcinoid tumor of the ileum: Secondary | ICD-10-CM | POA: Diagnosis not present

## 2019-12-20 DIAGNOSIS — Z79899 Other long term (current) drug therapy: Secondary | ICD-10-CM | POA: Diagnosis not present

## 2019-12-20 MED ORDER — OCTREOTIDE ACETATE 30 MG IM KIT
PACK | INTRAMUSCULAR | Status: AC
  Filled 2019-12-20: qty 1

## 2019-12-20 MED ORDER — OCTREOTIDE ACETATE 30 MG IM KIT
30.0000 mg | PACK | Freq: Once | INTRAMUSCULAR | Status: AC
  Administered 2019-12-20: 30 mg via INTRAMUSCULAR

## 2019-12-20 NOTE — Patient Instructions (Signed)
Octreotide injection solution What is this medicine? OCTREOTIDE (ok TREE oh tide) is used to reduce blood levels of growth hormone in patients with a condition called acromegaly. This medicine also reduces flushing and watery diarrhea caused by certain types of cancer. This medicine may be used for other purposes; ask your health care provider or pharmacist if you have questions. COMMON BRAND NAME(S): Bynfezia, Sandostatin What should I tell my health care provider before I take this medicine? They need to know if you have any of these conditions:  diabetes  gallbladder disease  kidney disease  liver disease  thyroid disease  an unusual or allergic reaction to octreotide, other medicines, foods, dyes, or preservatives  pregnant or trying to get pregnant  breast-feeding How should I use this medicine? This medicine is for injection under the skin or into a vein (only in emergency situations). It is usually given by a health care professional in a hospital or clinic setting. If you get this medicine at home, you will be taught how to prepare and give this medicine. Allow the injection solution to come to room temperature before use. Do not warm it artificially. Use exactly as directed. Take your medicine at regular intervals. Do not take your medicine more often than directed. It is important that you put your used needles and syringes in a special sharps container. Do not put them in a trash can. If you do not have a sharps container, call your pharmacist or healthcare provider to get one. Talk to your pediatrician regarding the use of this medicine in children. Special care may be needed. Overdosage: If you think you have taken too much of this medicine contact a poison control center or emergency room at once. NOTE: This medicine is only for you. Do not share this medicine with others. What if I miss a dose? If you miss a dose, take it as soon as you can. If it is almost time for your  next dose, take only that dose. Do not take double or extra doses. What may interact with this medicine?  bromocriptine  certain medicines for blood pressure, heart disease, irregular heartbeat  cyclosporine  diuretics  medicines for diabetes, including insulin  quinidine This list may not describe all possible interactions. Give your health care provider a list of all the medicines, herbs, non-prescription drugs, or dietary supplements you use. Also tell them if you smoke, drink alcohol, or use illegal drugs. Some items may interact with your medicine. What should I watch for while using this medicine? Visit your doctor or health care professional for regular checks on your progress. To help reduce irritation at the injection site, use a different site for each injection and make sure the solution is at room temperature before use. This medicine may cause decreases in blood sugar. Signs of low blood sugar include chills, cool, pale skin or cold sweats, drowsiness, extreme hunger, fast heartbeat, headache, nausea, nervousness or anxiety, shakiness, trembling, unsteadiness, tiredness, or weakness. Contact your doctor or health care professional right away if you experience any of these symptoms. This medicine may increase blood sugar. Ask your healthcare provider if changes in diet or medicines are needed if you have diabetes. This medicine may cause a decrease in vitamin B12. You should make sure that you get enough vitamin B12 while you are taking this medicine. Discuss the foods you eat and the vitamins you take with your health care professional. What side effects may I notice from receiving this medicine? Side   effects that you should report to your doctor or health care professional as soon as possible:  allergic reactions like skin rash, itching or hives, swelling of the face, lips, or tongue  fast, slow, or irregular heartbeat  right upper belly pain  severe stomach pain  signs  and symptoms of high blood sugar such as being more thirsty or hungry or having to urinate more than normal. You may also feel very tired or have blurry vision.  signs and symptoms of low blood sugar such as feeling anxious; confusion; dizziness; increased hunger; unusually weak or tired; increased sweating; shakiness; cold, clammy skin; irritable; headache; blurred vision; fast heartbeat; loss of consciousness  unusually weak or tired Side effects that usually do not require medical attention (report to your doctor or health care professional if they continue or are bothersome):  diarrhea  dizziness  gas  headache  nausea, vomiting  pain, redness, or irritation at site where injected  upset stomach This list may not describe all possible side effects. Call your doctor for medical advice about side effects. You may report side effects to FDA at 1-800-FDA-1088. Where should I keep my medicine? Keep out of the reach of children. Store in a refrigerator between 2 and 8 degrees C (36 and 46 degrees F). Protect from light. Allow to come to room temperature naturally. Do not use artificial heat. If protected from light, the injection may be stored at room temperature between 20 and 30 degrees C (70 and 86 degrees F) for 14 days. After the initial use, throw away any unused portion of a multiple dose vial after 14 days. Throw away unused portions of the ampules after use. NOTE: This sheet is a summary. It may not cover all possible information. If you have questions about this medicine, talk to your doctor, pharmacist, or health care provider.  2020 Elsevier/Gold Standard (2019-01-28 13:33:09)  

## 2020-01-11 ENCOUNTER — Telehealth: Payer: Self-pay | Admitting: Hematology

## 2020-01-11 DIAGNOSIS — H18832 Recurrent erosion of cornea, left eye: Secondary | ICD-10-CM | POA: Diagnosis not present

## 2020-01-11 NOTE — Telephone Encounter (Signed)
Called pt per 6/28 sch message - no answer. Left message for patient to call back to reschedule appt.

## 2020-01-24 ENCOUNTER — Ambulatory Visit: Payer: BC Managed Care – PPO

## 2020-01-26 ENCOUNTER — Other Ambulatory Visit: Payer: Self-pay

## 2020-01-26 DIAGNOSIS — E876 Hypokalemia: Secondary | ICD-10-CM

## 2020-01-26 DIAGNOSIS — C7B09 Secondary carcinoid tumors of other sites: Secondary | ICD-10-CM

## 2020-01-26 MED ORDER — POTASSIUM CHLORIDE CRYS ER 20 MEQ PO TBCR: 20.0000 meq | EXTENDED_RELEASE_TABLET | ORAL | 1 refills | Status: DC

## 2020-01-26 NOTE — Progress Notes (Signed)
Pt requesting 90 day rx for K be sent to [facility].

## 2020-01-31 ENCOUNTER — Inpatient Hospital Stay: Payer: BC Managed Care – PPO | Attending: Hematology

## 2020-01-31 ENCOUNTER — Inpatient Hospital Stay: Payer: BC Managed Care – PPO

## 2020-01-31 ENCOUNTER — Other Ambulatory Visit: Payer: Self-pay

## 2020-01-31 VITALS — BP 139/87 | HR 75 | Resp 18

## 2020-01-31 DIAGNOSIS — C7A012 Malignant carcinoid tumor of the ileum: Secondary | ICD-10-CM | POA: Diagnosis not present

## 2020-01-31 DIAGNOSIS — C7B09 Secondary carcinoid tumors of other sites: Secondary | ICD-10-CM

## 2020-01-31 LAB — CMP (CANCER CENTER ONLY)
ALT: 29 U/L (ref 0–44)
AST: 36 U/L (ref 15–41)
Albumin: 3.9 g/dL (ref 3.5–5.0)
Alkaline Phosphatase: 67 U/L (ref 38–126)
Anion gap: 10 (ref 5–15)
BUN: 15 mg/dL (ref 8–23)
CO2: 25 mmol/L (ref 22–32)
Calcium: 9 mg/dL (ref 8.9–10.3)
Chloride: 107 mmol/L (ref 98–111)
Creatinine: 0.94 mg/dL (ref 0.44–1.00)
GFR, Est AFR Am: >60 mL/min (ref >60)
GFR, Estimated: >60 mL/min (ref >60)
Glucose, Bld: 100 mg/dL — ABNORMAL HIGH (ref 70–99)
Potassium: 3.9 mmol/L (ref 3.5–5.1)
Sodium: 142 mmol/L (ref 135–145)
Total Bilirubin: 0.4 mg/dL (ref 0.3–1.2)
Total Protein: 7.3 g/dL (ref 6.5–8.1)

## 2020-01-31 LAB — CBC WITH DIFFERENTIAL (CANCER CENTER ONLY)
Abs Immature Granulocytes: 0.01 10*3/uL (ref 0.00–0.07)
Basophils Absolute: 0.1 10*3/uL (ref 0.0–0.1)
Basophils Relative: 1 %
Eosinophils Absolute: 0.1 10*3/uL (ref 0.0–0.5)
Eosinophils Relative: 2 %
HCT: 42.4 % (ref 36.0–46.0)
Hemoglobin: 13.6 g/dL (ref 12.0–15.0)
Immature Granulocytes: 0 %
Lymphocytes Relative: 37 %
Lymphs Abs: 2.2 10*3/uL (ref 0.7–4.0)
MCH: 29.8 pg (ref 26.0–34.0)
MCHC: 32.1 g/dL (ref 30.0–36.0)
MCV: 93 fL (ref 80.0–100.0)
Monocytes Absolute: 0.5 10*3/uL (ref 0.1–1.0)
Monocytes Relative: 8 %
Neutro Abs: 3.1 10*3/uL (ref 1.7–7.7)
Neutrophils Relative %: 52 %
Platelet Count: 246 10*3/uL (ref 150–400)
RBC: 4.56 MIL/uL (ref 3.87–5.11)
RDW: 12.5 % (ref 11.5–15.5)
WBC Count: 5.9 10*3/uL (ref 4.0–10.5)
nRBC: 0 % (ref 0.0–0.2)

## 2020-01-31 MED ORDER — OCTREOTIDE ACETATE 30 MG IM KIT
PACK | INTRAMUSCULAR | Status: AC
  Filled 2020-01-31: qty 1

## 2020-01-31 MED ORDER — OCTREOTIDE ACETATE 30 MG IM KIT
30.0000 mg | PACK | Freq: Once | INTRAMUSCULAR | Status: AC
  Administered 2020-01-31: 30 mg via INTRAMUSCULAR

## 2020-01-31 NOTE — Patient Instructions (Signed)
Octreotide injection solution What is this medicine? OCTREOTIDE (ok TREE oh tide) is used to reduce blood levels of growth hormone in patients with a condition called acromegaly. This medicine also reduces flushing and watery diarrhea caused by certain types of cancer. This medicine may be used for other purposes; ask your health care provider or pharmacist if you have questions. COMMON BRAND NAME(S): Bynfezia, Sandostatin What should I tell my health care provider before I take this medicine? They need to know if you have any of these conditions:  diabetes  gallbladder disease  kidney disease  liver disease  thyroid disease  an unusual or allergic reaction to octreotide, other medicines, foods, dyes, or preservatives  pregnant or trying to get pregnant  breast-feeding How should I use this medicine? This medicine is for injection under the skin or into a vein (only in emergency situations). It is usually given by a health care professional in a hospital or clinic setting. If you get this medicine at home, you will be taught how to prepare and give this medicine. Allow the injection solution to come to room temperature before use. Do not warm it artificially. Use exactly as directed. Take your medicine at regular intervals. Do not take your medicine more often than directed. It is important that you put your used needles and syringes in a special sharps container. Do not put them in a trash can. If you do not have a sharps container, call your pharmacist or healthcare provider to get one. Talk to your pediatrician regarding the use of this medicine in children. Special care may be needed. Overdosage: If you think you have taken too much of this medicine contact a poison control center or emergency room at once. NOTE: This medicine is only for you. Do not share this medicine with others. What if I miss a dose? If you miss a dose, take it as soon as you can. If it is almost time for your  next dose, take only that dose. Do not take double or extra doses. What may interact with this medicine?  bromocriptine  certain medicines for blood pressure, heart disease, irregular heartbeat  cyclosporine  diuretics  medicines for diabetes, including insulin  quinidine This list may not describe all possible interactions. Give your health care provider a list of all the medicines, herbs, non-prescription drugs, or dietary supplements you use. Also tell them if you smoke, drink alcohol, or use illegal drugs. Some items may interact with your medicine. What should I watch for while using this medicine? Visit your doctor or health care professional for regular checks on your progress. To help reduce irritation at the injection site, use a different site for each injection and make sure the solution is at room temperature before use. This medicine may cause decreases in blood sugar. Signs of low blood sugar include chills, cool, pale skin or cold sweats, drowsiness, extreme hunger, fast heartbeat, headache, nausea, nervousness or anxiety, shakiness, trembling, unsteadiness, tiredness, or weakness. Contact your doctor or health care professional right away if you experience any of these symptoms. This medicine may increase blood sugar. Ask your healthcare provider if changes in diet or medicines are needed if you have diabetes. This medicine may cause a decrease in vitamin B12. You should make sure that you get enough vitamin B12 while you are taking this medicine. Discuss the foods you eat and the vitamins you take with your health care professional. What side effects may I notice from receiving this medicine? Side   effects that you should report to your doctor or health care professional as soon as possible:  allergic reactions like skin rash, itching or hives, swelling of the face, lips, or tongue  fast, slow, or irregular heartbeat  right upper belly pain  severe stomach pain  signs  and symptoms of high blood sugar such as being more thirsty or hungry or having to urinate more than normal. You may also feel very tired or have blurry vision.  signs and symptoms of low blood sugar such as feeling anxious; confusion; dizziness; increased hunger; unusually weak or tired; increased sweating; shakiness; cold, clammy skin; irritable; headache; blurred vision; fast heartbeat; loss of consciousness  unusually weak or tired Side effects that usually do not require medical attention (report to your doctor or health care professional if they continue or are bothersome):  diarrhea  dizziness  gas  headache  nausea, vomiting  pain, redness, or irritation at site where injected  upset stomach This list may not describe all possible side effects. Call your doctor for medical advice about side effects. You may report side effects to FDA at 1-800-FDA-1088. Where should I keep my medicine? Keep out of the reach of children. Store in a refrigerator between 2 and 8 degrees C (36 and 46 degrees F). Protect from light. Allow to come to room temperature naturally. Do not use artificial heat. If protected from light, the injection may be stored at room temperature between 20 and 30 degrees C (70 and 86 degrees F) for 14 days. After the initial use, throw away any unused portion of a multiple dose vial after 14 days. Throw away unused portions of the ampules after use. NOTE: This sheet is a summary. It may not cover all possible information. If you have questions about this medicine, talk to your doctor, pharmacist, or health care provider.  2020 Elsevier/Gold Standard (2019-01-28 13:33:09)  

## 2020-02-01 LAB — CHROMOGRANIN A: Chromogranin A (ng/mL): 364.2 ng/mL — ABNORMAL HIGH (ref 0.0–101.8)

## 2020-02-21 ENCOUNTER — Ambulatory Visit: Payer: BC Managed Care – PPO | Admitting: Hematology

## 2020-02-21 ENCOUNTER — Ambulatory Visit: Payer: BC Managed Care – PPO

## 2020-02-25 ENCOUNTER — Encounter (HOSPITAL_COMMUNITY): Payer: Self-pay

## 2020-02-25 ENCOUNTER — Ambulatory Visit (HOSPITAL_COMMUNITY)
Admission: RE | Admit: 2020-02-25 | Discharge: 2020-02-25 | Disposition: A | Discharge status: Home / Self Care | Payer: BC Managed Care – PPO | Source: Ambulatory Visit | Attending: Hematology | Admitting: Hematology

## 2020-02-25 ENCOUNTER — Other Ambulatory Visit: Payer: Self-pay

## 2020-02-25 DIAGNOSIS — C7B09 Secondary carcinoid tumors of other sites: Secondary | ICD-10-CM

## 2020-02-25 DIAGNOSIS — C7A00 Malignant carcinoid tumor of unspecified site: Secondary | ICD-10-CM | POA: Diagnosis not present

## 2020-02-25 DIAGNOSIS — C787 Secondary malignant neoplasm of liver and intrahepatic bile duct: Secondary | ICD-10-CM | POA: Diagnosis not present

## 2020-02-25 DIAGNOSIS — K7689 Other specified diseases of liver: Secondary | ICD-10-CM | POA: Diagnosis not present

## 2020-02-25 MED ORDER — SODIUM CHLORIDE (PF) 0.9 % IJ SOLN
INTRAMUSCULAR | Status: AC
  Filled 2020-02-25: qty 50

## 2020-02-25 MED ORDER — IOHEXOL 300 MG/ML  SOLN
100.0000 mL | Freq: Once | INTRAMUSCULAR | Status: AC | PRN
  Administered 2020-02-25: 100 mL via INTRAVENOUS

## 2020-03-01 ENCOUNTER — Other Ambulatory Visit: Payer: Self-pay

## 2020-03-01 ENCOUNTER — Inpatient Hospital Stay: Payer: BC Managed Care – PPO

## 2020-03-01 ENCOUNTER — Inpatient Hospital Stay: Payer: BC Managed Care – PPO | Attending: Hematology | Admitting: Hematology

## 2020-03-01 ENCOUNTER — Encounter: Payer: Self-pay | Admitting: Hematology

## 2020-03-01 VITALS — BP 150/82 | HR 81 | Temp 97.8°F | Resp 17 | Ht 61.0 in | Wt 137.1 lb

## 2020-03-01 DIAGNOSIS — Z79899 Other long term (current) drug therapy: Secondary | ICD-10-CM | POA: Diagnosis not present

## 2020-03-01 DIAGNOSIS — I10 Essential (primary) hypertension: Secondary | ICD-10-CM | POA: Insufficient documentation

## 2020-03-01 DIAGNOSIS — C7B09 Secondary carcinoid tumors of other sites: Secondary | ICD-10-CM

## 2020-03-01 DIAGNOSIS — R911 Solitary pulmonary nodule: Secondary | ICD-10-CM | POA: Diagnosis not present

## 2020-03-01 DIAGNOSIS — C787 Secondary malignant neoplasm of liver and intrahepatic bile duct: Secondary | ICD-10-CM | POA: Insufficient documentation

## 2020-03-01 DIAGNOSIS — R232 Flushing: Secondary | ICD-10-CM | POA: Diagnosis not present

## 2020-03-01 DIAGNOSIS — M5136 Other intervertebral disc degeneration, lumbar region: Secondary | ICD-10-CM | POA: Insufficient documentation

## 2020-03-01 DIAGNOSIS — E876 Hypokalemia: Secondary | ICD-10-CM | POA: Insufficient documentation

## 2020-03-01 DIAGNOSIS — I700 Atherosclerosis of aorta: Secondary | ICD-10-CM | POA: Diagnosis not present

## 2020-03-01 DIAGNOSIS — N200 Calculus of kidney: Secondary | ICD-10-CM | POA: Diagnosis not present

## 2020-03-01 DIAGNOSIS — R197 Diarrhea, unspecified: Secondary | ICD-10-CM | POA: Diagnosis not present

## 2020-03-01 DIAGNOSIS — C786 Secondary malignant neoplasm of retroperitoneum and peritoneum: Secondary | ICD-10-CM | POA: Insufficient documentation

## 2020-03-01 DIAGNOSIS — Z87442 Personal history of urinary calculi: Secondary | ICD-10-CM | POA: Diagnosis not present

## 2020-03-01 DIAGNOSIS — C7A012 Malignant carcinoid tumor of the ileum: Secondary | ICD-10-CM | POA: Insufficient documentation

## 2020-03-01 DIAGNOSIS — K760 Fatty (change of) liver, not elsewhere classified: Secondary | ICD-10-CM | POA: Diagnosis not present

## 2020-03-01 DIAGNOSIS — M47816 Spondylosis without myelopathy or radiculopathy, lumbar region: Secondary | ICD-10-CM | POA: Insufficient documentation

## 2020-03-01 MED ORDER — OCTREOTIDE ACETATE 30 MG IM KIT
30.0000 mg | PACK | Freq: Once | INTRAMUSCULAR | Status: AC
  Administered 2020-03-01: 30 mg via INTRAMUSCULAR

## 2020-03-01 MED ORDER — OCTREOTIDE ACETATE 30 MG IM KIT
PACK | INTRAMUSCULAR | Status: AC
  Filled 2020-03-01: qty 1

## 2020-03-01 NOTE — Progress Notes (Signed)
[place]   [phone] Fax:(336) [phone]   Clinic Follow up Note   Patient Care Team: Patient, No Pcp Per as PCP - General (General Practice)  Date of Service:  03/01/2020  CHIEF COMPLAINT: F/u of Carcinoid tumor  SUMMARY OF ONCOLOGIC HISTORY: Oncology History Overview Note  Cancer Staging No matching staging information was found for the patient.    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    08/17/2005 Surgery   Carcinoid tumor of the ileum following several years of paroxysmal abdominal pain. The patient underwent surgical resection of her carcinoid tumor on 08/17/2005. Two out of 13 lymph nodes were positive. The patient's stage was III. The patient was noted to have an elevated urinary 5HIAA level in July 2011.   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 Sandostatin LAR increased to 60 mg IM every 4 weeks on 08/27/2017 due to disease progression. Held from 12/01/2017 per pt's request, restarted at 30mg  IM every 4 weeks on 05/11/2018    10/19/2012 Imaging   CT scan of the abdomen and pelvis with IV contrast on 10/19/2012 showed generally stable disease with the exception of a decrease in the right abdominal mesenteric lymphadenopathy. There have been 2 lesions in the liver which are felt to be stable. One lesion is in the lateral segment of the left hepatic lobe measuring 14 x 14 mm. The second lesion is seen in the lateral segment adjacent to the falciform ligament and measures 9 mm.     08/20/2017 PET scan   [facility] PET  IMPRESSION: 1. Intense radiotracer activity associated with multiple hyperdense liver lesions consistent with well differentiated neuroendocrine tumor hepatic metastasis. 2.  Multiple small foci peritoneal metastasis within the lower abdomen and pelvis. 3. Large mass associated the RIGHT adnexa and small lesions in LEFT adnexa also with intense radiotracer activity consistent with neuroendocrine tumor metastasis to the adnexa. 4. No evidence of pulmonary metastasis or skeletal metastasis.   08/24/2018 Imaging   CT AP W Contrast 08/24/18  IMPRESSION: 1. Multiple metastatic lesions are unchanged from the most recent prior CT. There are multiple hypervascular liver lesions as well as omental and peritoneal masses, which show no increase in size or number from the prior CT. No evidence of new metastatic disease. 2. No acute findings. 3. Hepatic steatosis.     Imaging   A positive octreotide scan was noted coming from the right lower pelvis adjacent to the bladder on 09/06/2010. Initially this was felt to be a uterine fibroid. The patient has continued to have positive octreotide scan on 11/07/2011 and 05/06/2012. CT scans of the abdomen and pelvis were also carried out on 03/02/2010, 02/24/2012 and 06/30/2012. These scans in conjunction with an elevated 5HIAA level of 22.7 on 04/13/2012 and a chromogranin A level which had risen to 18.0 on 03/23/2012 strongly suggested that the patient's disease had recurred.    08/24/2018 Imaging   CT AP W Contrast 08/24/18  IMPRESSION: 1. Multiple metastatic lesions are unchanged from the most recent prior CT. There are multiple hypervascular liver lesions as well as omental and peritoneal masses, which show no increase in size or number from the prior CT. No evidence of new metastatic disease. 2. No acute findings. 3. Hepatic  steatosis.   01/29/2019 Imaging   CT AP W Contrast 01/29/19 IMPRESSION: 1. Essentially stable metastatic carcinoid tumor/neuroendocrine tumor. 2. Multiple round enhancing hepatic lesions unchanged. 3. Bulky peritoneal mass in the posterior cul-de-sac stable. 4. Stable small peritoneal and mesenteric  metastasis. 5. No bowel obstruction. Serosal implant along the anti mesenteric border of the cecum unchanged.   07/12/2019 Imaging   CT AP W Contrast  IMPRESSION: 1. Essentially stable appearance of the scattered hepatic and peritoneal metastatic lesions. 2. Right nephrolithiasis. 3. Aortic atherosclerosis. 4. Stable appearance of larger tumor implants along the adnexa and along the fundal and posterior margins of the uterus. 5. Mild lower lumbar spondylosis and degenerative disc disease.   Aortic Atherosclerosis (ICD10-I70.0).   02/25/2020 Imaging   CT CAP w contrast IMPRESSION: 1. Overall, imaging findings are stable to minimally progressed in the interval. 2. Multiple hypervascular liver metastases are similar to minimally progressed since 07/12/2019. These are best visualized on arterial phase imaging. No new liver lesion on today's exam. 3. Stable hepatoduodenal ligament lymph nodes and pelvic metastases are stable. 4. Omental/mesenteric nodules measure minimally larger today. 5. Stable 4 mm posterior right lower lobe pulmonary nodule. Continued attention on follow-up recommended. 6. Aortic Atherosclerosis (ICD10-I70.0).        CURRENT THERAPY:  Sandostatin LAR 30 mg IM every 4 weeks, started on 07/07/2012. Sandostatin LAR increased to 60 mg IM every 4 weeks on 08/27/2017 due to disease progression. Held from 12/01/2017 per pt's request, restarted at 30mg  IM every 4 weeks on 05/11/2018  INTERVAL HISTORY:  Vanessa Waller is here for a follow up and treatment. She presents to the clinic alone. She notes she is doing well and stable. She notes her BM are better with rare diarrhea. She denies cramping and has adequate appetite. She notes she will have occasional flushing. She notes her BP is normal at home and other offices. I reviewed her medication list with her.     REVIEW OF SYSTEMS:   Constitutional: Denies fevers, chills or abnormal weight loss (+) Occasional  flushing Eyes: Denies blurriness of vision Ears, nose, mouth, throat, and face: Denies mucositis or sore throat Respiratory: Denies cough, dyspnea or wheezes Cardiovascular: Denies palpitation, chest discomfort or lower extremity swelling Gastrointestinal:  Denies nausea, heartburn or change in bowel habits Skin: Denies abnormal skin rashes Lymphatics: Denies new lymphadenopathy or easy bruising Neurological:Denies numbness, tingling or new weaknesses Behavioral/Psych: Mood is stable, no new changes  All other systems were reviewed with the patient and are negative.  MEDICAL HISTORY:  Past Medical History:  Diagnosis Date  . 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 carcinoid tumor dx'd 08/2005   small interstine  . Diverticulosis   . Hepatic steatosis 02/24/12  . History of kidney stones    x 2   . Hypopotassemia   . 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 ([place])    liver,mesentery,pelvis  . Nonspecific elevation of levels of transaminase or lactic acid dehydrogenase (LDH)   . Small bowel obstruction ([place]) 2007  . Uterine fibroid     SURGICAL HISTORY: Past Surgical History:  Procedure Laterality Date  . carcinoid tumor resection  2007   ileum  . colonscopy  not sure  . CYSTOSCOPY WITH URETEROSCOPY, STONE BASKETRY AND STENT PLACEMENT Right 10/14/2019   Procedure: CYSTOSCOPY WITH RIGHT URETEROSCOPY, LASER LITHOTRIPSY STONE BASKETRY AND RIGHT  STENT PLACEMENT;  Surgeon: Ardis Hughs, MD;  Location: [facility];  Service: Urology;  Laterality: Right;  .  CYSTOSCOPY/URETEROSCOPY/HOLMIUM LASER/STENT PLACEMENT Right 01/16/2018   Procedure: RIGHT URETEROSCOPY/RIGHT RETROGRADE/HOLMIUM LASER/STENT PLACEMENT;  Surgeon: Ardis Hughs, MD;  Location[facility];  Service: Urology;  Laterality: Right;  . TUBAL LIGATION      I have reviewed the social history and family history with the patient and they are unchanged from previous  note.  ALLERGIES:  is allergic to cephalexin and amoxicillin.  MEDICATIONS:  Current Outpatient Medications  Medication Sig Dispense Refill  . Ascorbic Acid (VITAMIN C) 500 MG CAPS Take 1 capsule by mouth daily.    . cyanocobalamin 1000 MCG tablet Take 1,000 mcg by mouth daily.    [name] pyridOXINE (VITAMIN B-6) 100 MG tablet Take 100 mg by mouth daily.    . Biotin 10 MG CAPS Take by mouth.    . calcium acetate (PHOSLO) 667 MG capsule Take by mouth every evening.     . Cholecalciferol (VITAMIN D-3) 5000 UNITS TABS Take 1 tablet by mouth every evening.     . loperamide (IMODIUM) 2 MG capsule Take by mouth as needed for diarrhea or loose stools.    . Multiple Vitamin (MULTIVITAMIN) tablet Take 1 tablet by mouth every evening.     [name] octreotide (SANDOSTATIN LAR) 30 MG injection Inject 30 mg into the muscle every 28 (twenty-eight) days.    . potassium chloride SA (KLOR-CON) 20 MEQ tablet Take 1 tablet (20 mEq total) by mouth every other day. 45 tablet 1   No current facility-administered medications for this visit.    PHYSICAL EXAMINATION: ECOG PERFORMANCE STATUS: 0 - Asymptomatic  Vitals:   03/01/20 1408  BP: (!) 150/82  Pulse: 81  Resp: 17  Temp: 97.8 F (36.6 C)  SpO2: 99%   Filed Weights   03/01/20 1408  Weight: 137 lb 1.6 oz (62.2 kg)    Due to COVID19 we will limit examination to appearance. Patient had no complaints.  GENERAL:alert, no distress and comfortable SKIN: skin color normal, no rashes or significant lesions EYES: normal, Conjunctiva are pink and non-injected, sclera clear  NEURO: alert & oriented x 3 with fluent speech   LABORATORY DATA:  I have reviewed the data as listed CBC Latest Ref Rng & Units 01/31/2020 11/15/2019 08/16/2019  WBC 4.0 - 10.5 K/uL 5.9 5.0 5.6  Hemoglobin 12.0 - 15.0 g/dL 13.6 13.7 14.0  Hematocrit 36 - 46 % 42.4 43.5 43.7  Platelets 150 - 400 K/uL 246 247 248     CMP Latest Ref Rng & Units 01/31/2020 11/15/2019 08/16/2019  Glucose 70 - 99  mg/dL 100(H) 114(H) 105(H)  BUN 8 - 23 mg/dL 15 17 15   Creatinine 0.44 - 1.00 mg/dL 0.94 0.92 0.91  Sodium 135 - 145 mmol/L 142 145 143  Potassium 3.5 - 5.1 mmol/L 3.9 3.8 3.5  Chloride 98 - 111 mmol/L 107 105 105  CO2 22 - 32 mmol/L 25 28 28   Calcium 8.9 - 10.3 mg/dL 9.0 9.1 9.0  Total Protein 6.5 - 8.1 g/dL 7.3 7.2 7.6  Total Bilirubin 0.3 - 1.2 mg/dL 0.4 0.5 0.4  Alkaline Phos 38 - 126 U/L 67 69 78  AST 15 - 41 U/L 36 38 38  ALT 0 - 44 U/L 29 40 41      RADIOGRAPHIC STUDIES: I have personally reviewed the radiological images as listed and agreed with the findings in the report. No results found.   ASSESSMENT & PLAN:  PAISELY BRICK is a 64 y.o. female with    1. 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 She was treated with surgical resectionin 2/007.  -Sheunfortunatelyhas cancer recurrencewith metastasisto the liver, peritoneumand adnexa in late 2013. She has been on Sandostatin injections since 07/07/12. Her diarrhea very mildand she has been toleratingwell.  -Due to mild disease progression in earlier 2019, I increased her dose of Sandostatin injectiondose to 60mg , she came offSandostatin injectionafter 4/15/2019per her request, and restart 30mg  dose in 04/2018, she has been tolerating well. -I personally reviewed and discussed her CT CAP from 02/25/20 which showed stable disease with minimal progression.  Compared to her multiple previous scan, her liver metastasis has slightly increased, however she is clinically doing well with normal liver functions, will continue same treatment. She is agreeable as she is tolerating well.   -Labs reviewed from last month, CBC and CMP WNL. Chromogranin A at 364.2. Overall adequate to proceed with Sandostatin injection today, continue every 4 weeks with labs every other treatment.  -f/u in 12 weeks.   2. Hypokalemia -Probably  related to her diarrheawhich is now rare.  -Has resolvedpreviously.Continue oral potassium every 3-4 days   3. HTN -Follow-up with primary care physician. Not on medications  -BP elevated today, which she notes only happens when she is in our clinic, otherwise normal   4. Cancer screening  -She is overdue for mammogram, last mammogram in July 2015. -She will continue follow up with her PCP and other specialists.   PLAN -CT reviewed, stable overall  -Proceed with Sandostatin injection today  Sandostatin injection every 4 weeks X3 -Lab in 8 weeks -f/u in 12 weeks    No problem-specific Assessment & Plan notes found for this encounter.   No orders of the defined types were placed in this encounter.  All questions were answered. The patient knows to call the clinic with any problems, questions or concerns. No barriers to learning was detected. The total time spent in the appointment was 30 minutes.     Truitt Merle, MD 03/01/2020   I, Joslyn Devon, am acting as scribe for Truitt Merle, MD.   I have reviewed the above documentation for accuracy and completeness, and I agree with the above.

## 2020-03-01 NOTE — Patient Instructions (Signed)
Octreotide injection solution What is this medicine? OCTREOTIDE (ok TREE oh tide) is used to reduce blood levels of growth hormone in patients with a condition called acromegaly. This medicine also reduces flushing and watery diarrhea caused by certain types of cancer. This medicine may be used for other purposes; ask your health care provider or pharmacist if you have questions. COMMON BRAND NAME(S): Bynfezia, Sandostatin What should I tell my health care provider before I take this medicine? They need to know if you have any of these conditions:  diabetes  gallbladder disease  kidney disease  liver disease  thyroid disease  an unusual or allergic reaction to octreotide, other medicines, foods, dyes, or preservatives  pregnant or trying to get pregnant  breast-feeding How should I use this medicine? This medicine is for injection under the skin or into a vein (only in emergency situations). It is usually given by a health care professional in a hospital or clinic setting. If you get this medicine at home, you will be taught how to prepare and give this medicine. Allow the injection solution to come to room temperature before use. Do not warm it artificially. Use exactly as directed. Take your medicine at regular intervals. Do not take your medicine more often than directed. It is important that you put your used needles and syringes in a special sharps container. Do not put them in a trash can. If you do not have a sharps container, call your pharmacist or healthcare provider to get one. Talk to your pediatrician regarding the use of this medicine in children. Special care may be needed. Overdosage: If you think you have taken too much of this medicine contact a poison control center or emergency room at once. NOTE: This medicine is only for you. Do not share this medicine with others. What if I miss a dose? If you miss a dose, take it as soon as you can. If it is almost time for your  next dose, take only that dose. Do not take double or extra doses. What may interact with this medicine?  bromocriptine  certain medicines for blood pressure, heart disease, irregular heartbeat  cyclosporine  diuretics  medicines for diabetes, including insulin  quinidine This list may not describe all possible interactions. Give your health care provider a list of all the medicines, herbs, non-prescription drugs, or dietary supplements you use. Also tell them if you smoke, drink alcohol, or use illegal drugs. Some items may interact with your medicine. What should I watch for while using this medicine? Visit your doctor or health care professional for regular checks on your progress. To help reduce irritation at the injection site, use a different site for each injection and make sure the solution is at room temperature before use. This medicine may cause decreases in blood sugar. Signs of low blood sugar include chills, cool, pale skin or cold sweats, drowsiness, extreme hunger, fast heartbeat, headache, nausea, nervousness or anxiety, shakiness, trembling, unsteadiness, tiredness, or weakness. Contact your doctor or health care professional right away if you experience any of these symptoms. This medicine may increase blood sugar. Ask your healthcare provider if changes in diet or medicines are needed if you have diabetes. This medicine may cause a decrease in vitamin B12. You should make sure that you get enough vitamin B12 while you are taking this medicine. Discuss the foods you eat and the vitamins you take with your health care professional. What side effects may I notice from receiving this medicine? Side   effects that you should report to your doctor or health care professional as soon as possible:  allergic reactions like skin rash, itching or hives, swelling of the face, lips, or tongue  fast, slow, or irregular heartbeat  right upper belly pain  severe stomach pain  signs  and symptoms of high blood sugar such as being more thirsty or hungry or having to urinate more than normal. You may also feel very tired or have blurry vision.  signs and symptoms of low blood sugar such as feeling anxious; confusion; dizziness; increased hunger; unusually weak or tired; increased sweating; shakiness; cold, clammy skin; irritable; headache; blurred vision; fast heartbeat; loss of consciousness  unusually weak or tired Side effects that usually do not require medical attention (report to your doctor or health care professional if they continue or are bothersome):  diarrhea  dizziness  gas  headache  nausea, vomiting  pain, redness, or irritation at site where injected  upset stomach This list may not describe all possible side effects. Call your doctor for medical advice about side effects. You may report side effects to FDA at 1-800-FDA-1088. Where should I keep my medicine? Keep out of the reach of children. Store in a refrigerator between 2 and 8 degrees C (36 and 46 degrees F). Protect from light. Allow to come to room temperature naturally. Do not use artificial heat. If protected from light, the injection may be stored at room temperature between 20 and 30 degrees C (70 and 86 degrees F) for 14 days. After the initial use, throw away any unused portion of a multiple dose vial after 14 days. Throw away unused portions of the ampules after use. NOTE: This sheet is a summary. It may not cover all possible information. If you have questions about this medicine, talk to your doctor, pharmacist, or health care provider.  2020 Elsevier/Gold Standard (2019-01-28 13:33:09)  

## 2020-03-02 ENCOUNTER — Telehealth: Payer: Self-pay | Admitting: Hematology

## 2020-03-02 NOTE — Telephone Encounter (Signed)
Scheduled per 8/18 los. Pt is aware of appt times and dates.

## 2020-03-21 ENCOUNTER — Encounter: Payer: Self-pay | Admitting: Hematology

## 2020-03-23 ENCOUNTER — Encounter: Payer: Self-pay | Admitting: Hematology

## 2020-03-23 ENCOUNTER — Other Ambulatory Visit: Payer: Self-pay

## 2020-03-23 DIAGNOSIS — E559 Vitamin D deficiency, unspecified: Secondary | ICD-10-CM

## 2020-03-23 DIAGNOSIS — E7849 Other hyperlipidemia: Secondary | ICD-10-CM

## 2020-03-27 ENCOUNTER — Inpatient Hospital Stay: Payer: BC Managed Care – PPO | Attending: Hematology

## 2020-03-27 ENCOUNTER — Ambulatory Visit: Payer: BC Managed Care – PPO

## 2020-03-27 ENCOUNTER — Other Ambulatory Visit: Payer: Self-pay

## 2020-03-27 VITALS — BP 124/70 | HR 77 | Temp 98.5°F | Resp 18

## 2020-03-27 DIAGNOSIS — C7A012 Malignant carcinoid tumor of the ileum: Secondary | ICD-10-CM

## 2020-03-27 DIAGNOSIS — Z79899 Other long term (current) drug therapy: Secondary | ICD-10-CM | POA: Diagnosis not present

## 2020-03-27 DIAGNOSIS — C7B09 Secondary carcinoid tumors of other sites: Secondary | ICD-10-CM

## 2020-03-27 MED ORDER — OCTREOTIDE ACETATE 30 MG IM KIT
30.0000 mg | PACK | Freq: Once | INTRAMUSCULAR | Status: AC
  Administered 2020-03-27: 30 mg via INTRAMUSCULAR

## 2020-03-27 MED ORDER — OCTREOTIDE ACETATE 30 MG IM KIT
PACK | INTRAMUSCULAR | Status: AC
  Filled 2020-03-27: qty 1

## 2020-03-27 NOTE — Patient Instructions (Signed)
Octreotide injection solution What is this medicine? OCTREOTIDE (ok TREE oh tide) is used to reduce blood levels of growth hormone in patients with a condition called acromegaly. This medicine also reduces flushing and watery diarrhea caused by certain types of cancer. This medicine may be used for other purposes; ask your health care provider or pharmacist if you have questions. COMMON BRAND NAME(S): Bynfezia, Sandostatin What should I tell my health care provider before I take this medicine? They need to know if you have any of these conditions:  diabetes  gallbladder disease  kidney disease  liver disease  thyroid disease  an unusual or allergic reaction to octreotide, other medicines, foods, dyes, or preservatives  pregnant or trying to get pregnant  breast-feeding How should I use this medicine? This medicine is for injection under the skin or into a vein (only in emergency situations). It is usually given by a health care professional in a hospital or clinic setting. If you get this medicine at home, you will be taught how to prepare and give this medicine. Allow the injection solution to come to room temperature before use. Do not warm it artificially. Use exactly as directed. Take your medicine at regular intervals. Do not take your medicine more often than directed. It is important that you put your used needles and syringes in a special sharps container. Do not put them in a trash can. If you do not have a sharps container, call your pharmacist or healthcare provider to get one. Talk to your pediatrician regarding the use of this medicine in children. Special care may be needed. Overdosage: If you think you have taken too much of this medicine contact a poison control center or emergency room at once. NOTE: This medicine is only for you. Do not share this medicine with others. What if I miss a dose? If you miss a dose, take it as soon as you can. If it is almost time for your  next dose, take only that dose. Do not take double or extra doses. What may interact with this medicine?  bromocriptine  certain medicines for blood pressure, heart disease, irregular heartbeat  cyclosporine  diuretics  medicines for diabetes, including insulin  quinidine This list may not describe all possible interactions. Give your health care provider a list of all the medicines, herbs, non-prescription drugs, or dietary supplements you use. Also tell them if you smoke, drink alcohol, or use illegal drugs. Some items may interact with your medicine. What should I watch for while using this medicine? Visit your doctor or health care professional for regular checks on your progress. To help reduce irritation at the injection site, use a different site for each injection and make sure the solution is at room temperature before use. This medicine may cause decreases in blood sugar. Signs of low blood sugar include chills, cool, pale skin or cold sweats, drowsiness, extreme hunger, fast heartbeat, headache, nausea, nervousness or anxiety, shakiness, trembling, unsteadiness, tiredness, or weakness. Contact your doctor or health care professional right away if you experience any of these symptoms. This medicine may increase blood sugar. Ask your healthcare provider if changes in diet or medicines are needed if you have diabetes. This medicine may cause a decrease in vitamin B12. You should make sure that you get enough vitamin B12 while you are taking this medicine. Discuss the foods you eat and the vitamins you take with your health care professional. What side effects may I notice from receiving this medicine? Side   effects that you should report to your doctor or health care professional as soon as possible:  allergic reactions like skin rash, itching or hives, swelling of the face, lips, or tongue  fast, slow, or irregular heartbeat  right upper belly pain  severe stomach pain  signs  and symptoms of high blood sugar such as being more thirsty or hungry or having to urinate more than normal. You may also feel very tired or have blurry vision.  signs and symptoms of low blood sugar such as feeling anxious; confusion; dizziness; increased hunger; unusually weak or tired; increased sweating; shakiness; cold, clammy skin; irritable; headache; blurred vision; fast heartbeat; loss of consciousness  unusually weak or tired Side effects that usually do not require medical attention (report to your doctor or health care professional if they continue or are bothersome):  diarrhea  dizziness  gas  headache  nausea, vomiting  pain, redness, or irritation at site where injected  upset stomach This list may not describe all possible side effects. Call your doctor for medical advice about side effects. You may report side effects to FDA at 1-800-FDA-1088. Where should I keep my medicine? Keep out of the reach of children. Store in a refrigerator between 2 and 8 degrees C (36 and 46 degrees F). Protect from light. Allow to come to room temperature naturally. Do not use artificial heat. If protected from light, the injection may be stored at room temperature between 20 and 30 degrees C (70 and 86 degrees F) for 14 days. After the initial use, throw away any unused portion of a multiple dose vial after 14 days. Throw away unused portions of the ampules after use. NOTE: This sheet is a summary. It may not cover all possible information. If you have questions about this medicine, talk to your doctor, pharmacist, or health care provider.  2020 Elsevier/Gold Standard (2019-01-28 13:33:09)  

## 2020-04-10 ENCOUNTER — Telehealth: Payer: Self-pay | Admitting: Hematology

## 2020-04-10 NOTE — Telephone Encounter (Signed)
Rescheduled appts on 10/11 to early AM per pt request. Pt is aware of appt times and date

## 2020-04-21 DIAGNOSIS — Z23 Encounter for immunization: Secondary | ICD-10-CM | POA: Diagnosis not present

## 2020-04-24 ENCOUNTER — Inpatient Hospital Stay: Payer: BC Managed Care – PPO | Attending: Hematology

## 2020-04-24 ENCOUNTER — Ambulatory Visit: Payer: BC Managed Care – PPO

## 2020-04-24 ENCOUNTER — Inpatient Hospital Stay: Payer: BC Managed Care – PPO

## 2020-04-24 ENCOUNTER — Other Ambulatory Visit: Payer: Self-pay

## 2020-04-24 ENCOUNTER — Other Ambulatory Visit: Payer: BC Managed Care – PPO

## 2020-04-24 VITALS — BP 155/87 | HR 79 | Temp 98.4°F | Resp 20

## 2020-04-24 DIAGNOSIS — C7A012 Malignant carcinoid tumor of the ileum: Secondary | ICD-10-CM | POA: Insufficient documentation

## 2020-04-24 DIAGNOSIS — C7B09 Secondary carcinoid tumors of other sites: Secondary | ICD-10-CM

## 2020-04-24 DIAGNOSIS — Z79899 Other long term (current) drug therapy: Secondary | ICD-10-CM | POA: Diagnosis not present

## 2020-04-24 DIAGNOSIS — E7849 Other hyperlipidemia: Secondary | ICD-10-CM

## 2020-04-24 DIAGNOSIS — E559 Vitamin D deficiency, unspecified: Secondary | ICD-10-CM

## 2020-04-24 LAB — CMP (CANCER CENTER ONLY)
ALT: 28 U/L (ref 0–44)
AST: 33 U/L (ref 15–41)
Albumin: 3.9 g/dL (ref 3.5–5.0)
Alkaline Phosphatase: 58 U/L (ref 38–126)
Anion gap: 8 (ref 5–15)
BUN: 11 mg/dL (ref 8–23)
CO2: 28 mmol/L (ref 22–32)
Calcium: 9.6 mg/dL (ref 8.9–10.3)
Chloride: 105 mmol/L (ref 98–111)
Creatinine: 0.91 mg/dL (ref 0.44–1.00)
GFR, Estimated: >60 mL/min (ref >60)
Glucose, Bld: 125 mg/dL — ABNORMAL HIGH (ref 70–99)
Potassium: 3.3 mmol/L — ABNORMAL LOW (ref 3.5–5.1)
Sodium: 141 mmol/L (ref 135–145)
Total Bilirubin: 0.6 mg/dL (ref 0.3–1.2)
Total Protein: 7.6 g/dL (ref 6.5–8.1)

## 2020-04-24 LAB — VITAMIN D 25 HYDROXY (VIT D DEFICIENCY, FRACTURES): Vit D, 25-Hydroxy: 27.55 ng/mL — ABNORMAL LOW (ref 30–100)

## 2020-04-24 LAB — CBC WITH DIFFERENTIAL (CANCER CENTER ONLY)
Abs Immature Granulocytes: 0.01 10*3/uL (ref 0.00–0.07)
Basophils Absolute: 0.1 10*3/uL (ref 0.0–0.1)
Basophils Relative: 1 %
Eosinophils Absolute: 0.1 10*3/uL (ref 0.0–0.5)
Eosinophils Relative: 1 %
HCT: 45.3 % (ref 36.0–46.0)
Hemoglobin: 14.5 g/dL (ref 12.0–15.0)
Immature Granulocytes: 0 %
Lymphocytes Relative: 27 %
Lymphs Abs: 1.5 10*3/uL (ref 0.7–4.0)
MCH: 29.5 pg (ref 26.0–34.0)
MCHC: 32 g/dL (ref 30.0–36.0)
MCV: 92.3 fL (ref 80.0–100.0)
Monocytes Absolute: 0.3 10*3/uL (ref 0.1–1.0)
Monocytes Relative: 6 %
Neutro Abs: 3.8 10*3/uL (ref 1.7–7.7)
Neutrophils Relative %: 65 %
Platelet Count: 271 10*3/uL (ref 150–400)
RBC: 4.91 MIL/uL (ref 3.87–5.11)
RDW: 12.6 % (ref 11.5–15.5)
WBC Count: 5.7 10*3/uL (ref 4.0–10.5)
nRBC: 0 % (ref 0.0–0.2)

## 2020-04-24 MED ORDER — OCTREOTIDE ACETATE 30 MG IM KIT
PACK | INTRAMUSCULAR | Status: AC
  Filled 2020-04-24: qty 1

## 2020-04-24 MED ORDER — OCTREOTIDE ACETATE 30 MG IM KIT
30.0000 mg | PACK | Freq: Once | INTRAMUSCULAR | Status: AC
  Administered 2020-04-24: 30 mg via INTRAMUSCULAR

## 2020-04-24 NOTE — Patient Instructions (Signed)
Octreotide injection solution What is this medicine? OCTREOTIDE (ok TREE oh tide) is used to reduce blood levels of growth hormone in patients with a condition called acromegaly. This medicine also reduces flushing and watery diarrhea caused by certain types of cancer. This medicine may be used for other purposes; ask your health care provider or pharmacist if you have questions. COMMON BRAND NAME(S): Bynfezia, Sandostatin What should I tell my health care provider before I take this medicine? They need to know if you have any of these conditions:  diabetes  gallbladder disease  kidney disease  liver disease  thyroid disease  an unusual or allergic reaction to octreotide, other medicines, foods, dyes, or preservatives  pregnant or trying to get pregnant  breast-feeding How should I use this medicine? This medicine is for injection under the skin or into a vein (only in emergency situations). It is usually given by a health care professional in a hospital or clinic setting. If you get this medicine at home, you will be taught how to prepare and give this medicine. Allow the injection solution to come to room temperature before use. Do not warm it artificially. Use exactly as directed. Take your medicine at regular intervals. Do not take your medicine more often than directed. It is important that you put your used needles and syringes in a special sharps container. Do not put them in a trash can. If you do not have a sharps container, call your pharmacist or healthcare provider to get one. Talk to your pediatrician regarding the use of this medicine in children. Special care may be needed. Overdosage: If you think you have taken too much of this medicine contact a poison control center or emergency room at once. NOTE: This medicine is only for you. Do not share this medicine with others. What if I miss a dose? If you miss a dose, take it as soon as you can. If it is almost time for your  next dose, take only that dose. Do not take double or extra doses. What may interact with this medicine?  bromocriptine  certain medicines for blood pressure, heart disease, irregular heartbeat  cyclosporine  diuretics  medicines for diabetes, including insulin  quinidine This list may not describe all possible interactions. Give your health care provider a list of all the medicines, herbs, non-prescription drugs, or dietary supplements you use. Also tell them if you smoke, drink alcohol, or use illegal drugs. Some items may interact with your medicine. What should I watch for while using this medicine? Visit your doctor or health care professional for regular checks on your progress. To help reduce irritation at the injection site, use a different site for each injection and make sure the solution is at room temperature before use. This medicine may cause decreases in blood sugar. Signs of low blood sugar include chills, cool, pale skin or cold sweats, drowsiness, extreme hunger, fast heartbeat, headache, nausea, nervousness or anxiety, shakiness, trembling, unsteadiness, tiredness, or weakness. Contact your doctor or health care professional right away if you experience any of these symptoms. This medicine may increase blood sugar. Ask your healthcare provider if changes in diet or medicines are needed if you have diabetes. This medicine may cause a decrease in vitamin B12. You should make sure that you get enough vitamin B12 while you are taking this medicine. Discuss the foods you eat and the vitamins you take with your health care professional. What side effects may I notice from receiving this medicine? Side   effects that you should report to your doctor or health care professional as soon as possible:  allergic reactions like skin rash, itching or hives, swelling of the face, lips, or tongue  fast, slow, or irregular heartbeat  right upper belly pain  severe stomach pain  signs  and symptoms of high blood sugar such as being more thirsty or hungry or having to urinate more than normal. You may also feel very tired or have blurry vision.  signs and symptoms of low blood sugar such as feeling anxious; confusion; dizziness; increased hunger; unusually weak or tired; increased sweating; shakiness; cold, clammy skin; irritable; headache; blurred vision; fast heartbeat; loss of consciousness  unusually weak or tired Side effects that usually do not require medical attention (report to your doctor or health care professional if they continue or are bothersome):  diarrhea  dizziness  gas  headache  nausea, vomiting  pain, redness, or irritation at site where injected  upset stomach This list may not describe all possible side effects. Call your doctor for medical advice about side effects. You may report side effects to FDA at 1-800-FDA-1088. Where should I keep my medicine? Keep out of the reach of children. Store in a refrigerator between 2 and 8 degrees C (36 and 46 degrees F). Protect from light. Allow to come to room temperature naturally. Do not use artificial heat. If protected from light, the injection may be stored at room temperature between 20 and 30 degrees C (70 and 86 degrees F) for 14 days. After the initial use, throw away any unused portion of a multiple dose vial after 14 days. Throw away unused portions of the ampules after use. NOTE: This sheet is a summary. It may not cover all possible information. If you have questions about this medicine, talk to your doctor, pharmacist, or health care provider.  2020 Elsevier/Gold Standard (2019-01-28 13:33:09)  

## 2020-04-25 LAB — CHROMOGRANIN A: Chromogranin A (ng/mL): 334.9 ng/mL — ABNORMAL HIGH (ref 0.0–101.8)

## 2020-04-28 LAB — 5 HIAA, QUANTITATIVE, URINE, 24 HOUR
5-HIAA, Ur: 24.5 mg/L
5-HIAA,Quant.,24 Hr Urine: 29.4 mg/24 hr — ABNORMAL HIGH (ref 0.0–14.9)

## 2020-04-29 ENCOUNTER — Encounter: Payer: Self-pay | Admitting: Hematology

## 2020-05-19 NOTE — Progress Notes (Signed)
Vanessa Waller   [phone] [phone]) [phone]   Clinic Follow up Note   Patient Care Team: Patient, No Pcp Per as PCP - General (General Practice)  Date of Service:  05/22/2020  CHIEF COMPLAINT: F/u of Carcinoid tumor  SUMMARY OF ONCOLOGIC HISTORY: Oncology History Overview Note  Cancer Staging No matching staging information was found for the patient.    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    08/17/2005 Surgery   Carcinoid tumor of the ileum following several years of paroxysmal abdominal pain. The patient underwent surgical resection of her carcinoid tumor on 08/17/2005. Two out of 13 lymph nodes were positive. The patient's stage was III. The patient was noted to have an elevated urinary 5HIAA level in July 2011.   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 Sandostatin LAR increased to 60 mg IM every 4 weeks on 08/27/2017 due to disease progression. Held from 12/01/2017 per pt's request, restarted at 30mg  IM every 4 weeks on 05/11/2018    10/19/2012 Imaging   CT scan of the abdomen and pelvis with IV contrast on 10/19/2012 showed generally stable disease with the exception of a decrease in the right abdominal mesenteric lymphadenopathy. There have been 2 lesions in the liver which are felt to be stable. One lesion is in the lateral segment of the left hepatic lobe measuring 14 x 14 mm. The second lesion is seen in the lateral segment adjacent to the falciform ligament and measures 9 mm.     08/20/2017 PET scan   Donatate PET  IMPRESSION: 1. Intense radiotracer activity associated with multiple hyperdense liver lesions consistent with well differentiated neuroendocrine tumor hepatic metastasis. 2.  Multiple small foci peritoneal metastasis within the lower abdomen and pelvis. 3. Large mass associated the RIGHT adnexa and small lesions in LEFT adnexa also with intense radiotracer activity consistent with neuroendocrine tumor metastasis to the adnexa. 4. No evidence of pulmonary metastasis or skeletal metastasis.   08/24/2018 Imaging   CT AP W Contrast 08/24/18  IMPRESSION: 1. Multiple metastatic lesions are unchanged from the most recent prior CT. There are multiple hypervascular liver lesions as well as omental and peritoneal masses, which show no increase in size or number from the prior CT. No evidence of new metastatic disease. 2. No acute findings. 3. Hepatic steatosis.     Imaging   A positive octreotide scan was noted coming from the right lower pelvis adjacent to the bladder on 09/06/2010. Initially this was felt to be a uterine fibroid. The patient has continued to have positive octreotide scan on 11/07/2011 and 05/06/2012. CT scans of the abdomen and pelvis were also carried out on 03/02/2010, 02/24/2012 and 06/30/2012. These scans in conjunction with an elevated 5HIAA level of 22.7 on 04/13/2012 and a chromogranin A level which had risen to 18.0 on 03/23/2012 strongly suggested that the patient's disease had recurred.    08/24/2018 Imaging   CT AP W Contrast 08/24/18  IMPRESSION: 1. Multiple metastatic lesions are unchanged from the most recent prior CT. There are multiple hypervascular liver lesions as well as omental and peritoneal masses, which show no increase in size or number from the prior CT. No evidence of new metastatic disease. 2. No acute findings. 3. Hepatic  steatosis.   01/29/2019 Imaging   CT AP W Contrast 01/29/19 IMPRESSION: 1. Essentially stable metastatic carcinoid tumor/neuroendocrine tumor. 2. Multiple round enhancing hepatic lesions unchanged. 3. Bulky peritoneal mass in the posterior cul-de-sac stable. 4. Stable small peritoneal and mesenteric  metastasis. 5. No bowel obstruction. Serosal implant along the anti mesenteric border of the cecum unchanged.   07/12/2019 Imaging   CT AP W Contrast  IMPRESSION: 1. Essentially stable appearance of the scattered hepatic and peritoneal metastatic lesions. 2. Right nephrolithiasis. 3. Aortic atherosclerosis. 4. Stable appearance of larger tumor implants along the adnexa and along the fundal and posterior margins of the uterus. 5. Mild lower lumbar spondylosis and degenerative disc disease.   Aortic Atherosclerosis (ICD10-I70.0).   02/25/2020 Imaging   CT CAP w contrast IMPRESSION: 1. Overall, imaging findings are stable to minimally progressed in the interval. 2. Multiple hypervascular liver metastases are similar to minimally progressed since 07/12/2019. These are best visualized on arterial phase imaging. No new liver lesion on today's exam. 3. Stable hepatoduodenal ligament lymph nodes and pelvic metastases are stable. 4. Omental/mesenteric nodules measure minimally larger today. 5. Stable 4 mm posterior right lower lobe pulmonary nodule. Continued attention on follow-up recommended. 6. Aortic Atherosclerosis (ICD10-I70.0).        CURRENT THERAPY:  Sandostatin LAR 30 mg IM every 4 weeks, started on 07/07/2012. Sandostatin LAR increased to 60 mg IM every 4 weeks on 08/27/2017 due to disease progression. Held from 12/01/2017 per pt's request, restarted at 30mg  IM every 4 weeks on 05/11/2018   INTERVAL HISTORY:  Vanessa Waller is here for a follow up. She presents to the clinic alone. She notes she is doing well. She denies any new major changes. I reviewed her medication list with her. She is on Vit D 10000 units daily and potassium once every 3 days.     REVIEW OF SYSTEMS:   Constitutional: Denies fevers, chills or abnormal weight loss Eyes: Denies blurriness of vision Ears, nose, mouth, throat, and face: Denies mucositis or sore throat Respiratory: Denies cough,  dyspnea or wheezes Cardiovascular: Denies palpitation, chest discomfort or lower extremity swelling Gastrointestinal:  Denies nausea, heartburn or change in bowel habits Skin: Denies abnormal skin rashes Lymphatics: Denies new lymphadenopathy or easy bruising Neurological:Denies numbness, tingling or new weaknesses Behavioral/Psych: Mood is stable, no new changes  All other systems were reviewed with the patient and are negative.  MEDICAL HISTORY:  Past Medical History:  Diagnosis Date  . 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 carcinoid tumor dx'd 08/2005   small interstine  . Diverticulosis   . Hepatic steatosis 02/24/12  . History of kidney stones    x 2   . Hypopotassemia   . 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 ([place])    liver,mesentery,pelvis  . Nonspecific elevation of levels of transaminase or lactic acid dehydrogenase (LDH)   . Small bowel obstruction ([place]) 2007  . Uterine fibroid     SURGICAL HISTORY: Past Surgical History:  Procedure Laterality Date  . carcinoid tumor resection  2007   ileum  . colonscopy  not sure  . CYSTOSCOPY WITH URETEROSCOPY, STONE BASKETRY AND STENT PLACEMENT Right 10/14/2019   Procedure: CYSTOSCOPY WITH RIGHT URETEROSCOPY, LASER LITHOTRIPSY STONE BASKETRY AND RIGHT  STENT PLACEMENT;  Surgeon: Ardis Hughs, MD;  Location: [facility];  Service: Urology;  Laterality: Right;  . CYSTOSCOPY/URETEROSCOPY/HOLMIUM LASER/STENT PLACEMENT Right 01/16/2018   Procedure: RIGHT URETEROSCOPY/RIGHT RETROGRADE/HOLMIUM LASER/STENT PLACEMENT;  Surgeon: Ardis Hughs, MD;  Location: [place];  Service: Urology;  Laterality: Right;  . TUBAL LIGATION      I have reviewed the social history and family history with the patient and they are unchanged from previous note.  ALLERGIES:  is allergic to cephalexin and amoxicillin.  MEDICATIONS:  Current Outpatient Medications  Medication Sig Dispense Refill   . Ascorbic Acid (VITAMIN C) 500 MG CAPS Take 1 capsule by mouth daily.    . Biotin 10 MG CAPS Take by mouth.    . calcium acetate (PHOSLO) 667 MG capsule Take by mouth every evening.     . Cholecalciferol (VITAMIN D-3) 5000 UNITS TABS Take 1 tablet by mouth every evening.     . cyanocobalamin 1000 MCG tablet Take 1,000 mcg by mouth daily.    [name] loperamide (IMODIUM) 2 MG capsule Take by mouth as needed for diarrhea or loose stools.    . Multiple Vitamin (MULTIVITAMIN) tablet Take 1 tablet by mouth every evening.     [name] octreotide (SANDOSTATIN LAR) 30 MG injection Inject 30 mg into the muscle every 28 (twenty-eight) days.    . potassium chloride SA (KLOR-CON) 20 MEQ tablet Take 1 tablet (20 mEq total) by mouth every other day. 45 tablet 1  . pyridOXINE (VITAMIN B-6) 100 MG tablet Take 100 mg by mouth daily.     No current facility-administered medications for this visit.    PHYSICAL EXAMINATION: ECOG PERFORMANCE STATUS: 1 - Symptomatic but completely ambulatory  Vitals:   05/22/20 1420  BP: (!) 141/82  Pulse: 81  Resp: 18  Temp: 97.6 F (36.4 C)  SpO2: 100%   Filed Weights   05/22/20 1420  Weight: 138 lb 14.4 oz (63 kg)    GENERAL:alert, no distress and comfortable SKIN: skin color, texture, turgor are normal, no rashes or significant lesions EYES: normal, Conjunctiva are pink and non-injected, sclera clear  NECK: supple, thyroid normal size, non-tender, without nodularity LYMPH:  no palpable lymphadenopathy in the cervical, axillary  LUNGS: clear to auscultation and percussion with normal breathing effort HEART: regular rate & rhythm and no murmurs and no lower extremity edema ABDOMEN:abdomen soft, non-tender and normal bowel sounds Musculoskeletal:no cyanosis of digits and no clubbing  NEURO: alert & oriented x 3 with fluent speech, no focal motor/sensory deficits  LABORATORY DATA:  I have reviewed the data as listed CBC Latest Ref Rng & Units 04/24/2020 01/31/2020 11/15/2019   WBC 4.0 - 10.5 K/uL 5.7 5.9 5.0  Hemoglobin 12.0 - 15.0 g/dL 14.5 13.6 13.7  Hematocrit 36 - 46 % 45.3 42.4 43.5  Platelets 150 - 400 K/uL 271 246 247     CMP Latest Ref Rng & Units 04/24/2020 01/31/2020 11/15/2019  Glucose 70 - 99 mg/dL 125(H) 100(H) 114(H)  BUN 8 - 23 mg/dL 11 15 17   Creatinine 0.44 - 1.00 mg/dL 0.91 0.94 0.92  Sodium 135 - 145 mmol/L 141 142 145  Potassium 3.5 - 5.1 mmol/L 3.3(L) 3.9 3.8  Chloride 98 - 111 mmol/L 105 107 105  CO2 22 - 32 mmol/L 28 25 28   Calcium 8.9 - 10.3 mg/dL 9.6 9.0 9.1  Total Protein 6.5 - 8.1 g/dL 7.6 7.3 7.2  Total Bilirubin 0.3 - 1.2 mg/dL 0.6 0.4 0.5  Alkaline Phos 38 - 126 U/L 58 67 69  AST 15 - 41 U/L 33 36 38  ALT 0 - 44 U/L 28 29 40      RADIOGRAPHIC STUDIES: I have personally reviewed the radiological images as listed and  agreed with the findings in the report. No results found.   ASSESSMENT & PLAN:  KAYLOR MAIERS is a 64 y.o. female with    1. 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 She was treated with surgical resectionin 2/007.  -Sheunfortunatelyhas cancer recurrencewith metastasisto the liver, peritoneumand adnexa in late 2013. She has been on Sandostatin injections since 07/07/12. Her diarrhea very mildand she has been toleratingwell.  -Due to mild disease progression in earlier 2019, I increased her dose of Sandostatin injectiondose to 60mg , she came offSandostatin injectionafter 4/15/2019per her request, and restart 30mg  dose in 04/2018, she has been tolerating well. -She is clinically doing well. Labs reviewed, CBC and CMP WNL except K 3.3, BG 125. Her 24 hr urine 5-HIAA did increase but her Chromogranin A did decrease. I discussed her tumor markers can be non-specific. Given no concerning symptoms, will wait to evaluate with next scan -Will proceed with Sandostatin injection today, continue every 4 weeks with  labs in 8 weeks -f/u in 3 months with CT CAP.   2. Hypokalemia -Continue oral potassium every 3 days   3. HTN -Follow-up with primary care physician. Not on medications   4. Cancer screening  -She is overdue for mammogram, last mammogram in July 2015. -She will continue follow up with her PCP and other specialists.   PLAN -I refilled oral potassium today -Proceed with Sandostatin injection today  -Sandostatin injection every 4 weeks X3 -Lab in 8 weeks -f/u in 3 months with CT CAP w contrast a few days before    No problem-specific Assessment & Plan notes found for this encounter.   Orders Placed This Encounter  Procedures  . CT CHEST ABDOMEN PELVIS W CONTRAST    Standing Status:   Future    Standing Expiration Date:   05/22/2021    Order Specific Question:   If indicated for the ordered procedure, I authorize the administration of contrast media per Radiology protocol    Answer:   Yes    Order Specific Question:   Preferred imaging location?    Answer:   [facility]    Order Specific Question:   Release to patient    Answer:   Immediate    Order Specific Question:   Is Oral Contrast requested for this exam?    Answer:   Yes, Per Radiology protocol    Order Specific Question:   Reason for Exam (SYMPTOM  OR DIAGNOSIS REQUIRED)    Answer:   restaging   All questions were answered. The patient knows to call the clinic with any problems, questions or concerns. No barriers to learning was detected. The total time spent in the appointment was 30 minutes.     Truitt Merle, MD 05/22/2020   I, Joslyn Devon, am acting as scribe for Truitt Merle, MD.   I have reviewed the above documentation for accuracy and completeness, and I agree with the above.

## 2020-05-22 ENCOUNTER — Other Ambulatory Visit: Payer: Self-pay

## 2020-05-22 ENCOUNTER — Inpatient Hospital Stay: Payer: BC Managed Care – PPO | Attending: Hematology

## 2020-05-22 ENCOUNTER — Inpatient Hospital Stay (HOSPITAL_BASED_OUTPATIENT_CLINIC_OR_DEPARTMENT_OTHER): Payer: BC Managed Care – PPO | Admitting: Hematology

## 2020-05-22 ENCOUNTER — Telehealth: Payer: Self-pay | Admitting: Hematology

## 2020-05-22 ENCOUNTER — Encounter: Payer: Self-pay | Admitting: Hematology

## 2020-05-22 VITALS — BP 141/82 | HR 81 | Temp 97.6°F | Resp 18 | Ht 61.0 in | Wt 138.9 lb

## 2020-05-22 DIAGNOSIS — C7B02 Secondary carcinoid tumors of liver: Secondary | ICD-10-CM | POA: Insufficient documentation

## 2020-05-22 DIAGNOSIS — C7A012 Malignant carcinoid tumor of the ileum: Secondary | ICD-10-CM

## 2020-05-22 DIAGNOSIS — K760 Fatty (change of) liver, not elsewhere classified: Secondary | ICD-10-CM | POA: Insufficient documentation

## 2020-05-22 DIAGNOSIS — Z87442 Personal history of urinary calculi: Secondary | ICD-10-CM | POA: Diagnosis not present

## 2020-05-22 DIAGNOSIS — C7B09 Secondary carcinoid tumors of other sites: Secondary | ICD-10-CM

## 2020-05-22 DIAGNOSIS — N200 Calculus of kidney: Secondary | ICD-10-CM | POA: Diagnosis not present

## 2020-05-22 DIAGNOSIS — E876 Hypokalemia: Secondary | ICD-10-CM | POA: Insufficient documentation

## 2020-05-22 DIAGNOSIS — Z79899 Other long term (current) drug therapy: Secondary | ICD-10-CM | POA: Insufficient documentation

## 2020-05-22 DIAGNOSIS — I700 Atherosclerosis of aorta: Secondary | ICD-10-CM | POA: Diagnosis not present

## 2020-05-22 DIAGNOSIS — I10 Essential (primary) hypertension: Secondary | ICD-10-CM | POA: Diagnosis not present

## 2020-05-22 MED ORDER — OCTREOTIDE ACETATE 30 MG IM KIT
30.0000 mg | PACK | Freq: Once | INTRAMUSCULAR | Status: AC
  Administered 2020-05-22: 30 mg via INTRAMUSCULAR

## 2020-05-22 MED ORDER — OCTREOTIDE ACETATE 30 MG IM KIT
PACK | INTRAMUSCULAR | Status: AC
  Filled 2020-05-22: qty 1

## 2020-05-22 NOTE — Patient Instructions (Signed)
Octreotide injection solution What is this medicine? OCTREOTIDE (ok TREE oh tide) is used to reduce blood levels of growth hormone in patients with a condition called acromegaly. This medicine also reduces flushing and watery diarrhea caused by certain types of cancer. This medicine may be used for other purposes; ask your health care provider or pharmacist if you have questions. COMMON BRAND NAME(S): Bynfezia, Sandostatin What should I tell my health care provider before I take this medicine? They need to know if you have any of these conditions:  diabetes  gallbladder disease  kidney disease  liver disease  thyroid disease  an unusual or allergic reaction to octreotide, other medicines, foods, dyes, or preservatives  pregnant or trying to get pregnant  breast-feeding How should I use this medicine? This medicine is for injection under the skin or into a vein (only in emergency situations). It is usually given by a health care professional in a hospital or clinic setting. If you get this medicine at home, you will be taught how to prepare and give this medicine. Allow the injection solution to come to room temperature before use. Do not warm it artificially. Use exactly as directed. Take your medicine at regular intervals. Do not take your medicine more often than directed. It is important that you put your used needles and syringes in a special sharps container. Do not put them in a trash can. If you do not have a sharps container, call your pharmacist or healthcare provider to get one. Talk to your pediatrician regarding the use of this medicine in children. Special care may be needed. Overdosage: If you think you have taken too much of this medicine contact a poison control center or emergency room at once. NOTE: This medicine is only for you. Do not share this medicine with others. What if I miss a dose? If you miss a dose, take it as soon as you can. If it is almost time for your  next dose, take only that dose. Do not take double or extra doses. What may interact with this medicine?  bromocriptine  certain medicines for blood pressure, heart disease, irregular heartbeat  cyclosporine  diuretics  medicines for diabetes, including insulin  quinidine This list may not describe all possible interactions. Give your health care provider a list of all the medicines, herbs, non-prescription drugs, or dietary supplements you use. Also tell them if you smoke, drink alcohol, or use illegal drugs. Some items may interact with your medicine. What should I watch for while using this medicine? Visit your doctor or health care professional for regular checks on your progress. To help reduce irritation at the injection site, use a different site for each injection and make sure the solution is at room temperature before use. This medicine may cause decreases in blood sugar. Signs of low blood sugar include chills, cool, pale skin or cold sweats, drowsiness, extreme hunger, fast heartbeat, headache, nausea, nervousness or anxiety, shakiness, trembling, unsteadiness, tiredness, or weakness. Contact your doctor or health care professional right away if you experience any of these symptoms. This medicine may increase blood sugar. Ask your healthcare provider if changes in diet or medicines are needed if you have diabetes. This medicine may cause a decrease in vitamin B12. You should make sure that you get enough vitamin B12 while you are taking this medicine. Discuss the foods you eat and the vitamins you take with your health care professional. What side effects may I notice from receiving this medicine? Side   effects that you should report to your doctor or health care professional as soon as possible:  allergic reactions like skin rash, itching or hives, swelling of the face, lips, or tongue  fast, slow, or irregular heartbeat  right upper belly pain  severe stomach pain  signs  and symptoms of high blood sugar such as being more thirsty or hungry or having to urinate more than normal. You may also feel very tired or have blurry vision.  signs and symptoms of low blood sugar such as feeling anxious; confusion; dizziness; increased hunger; unusually weak or tired; increased sweating; shakiness; cold, clammy skin; irritable; headache; blurred vision; fast heartbeat; loss of consciousness  unusually weak or tired Side effects that usually do not require medical attention (report to your doctor or health care professional if they continue or are bothersome):  diarrhea  dizziness  gas  headache  nausea, vomiting  pain, redness, or irritation at site where injected  upset stomach This list may not describe all possible side effects. Call your doctor for medical advice about side effects. You may report side effects to FDA at 1-800-FDA-1088. Where should I keep my medicine? Keep out of the reach of children. Store in a refrigerator between 2 and 8 degrees C (36 and 46 degrees F). Protect from light. Allow to come to room temperature naturally. Do not use artificial heat. If protected from light, the injection may be stored at room temperature between 20 and 30 degrees C (70 and 86 degrees F) for 14 days. After the initial use, throw away any unused portion of a multiple dose vial after 14 days. Throw away unused portions of the ampules after use. NOTE: This sheet is a summary. It may not cover all possible information. If you have questions about this medicine, talk to your doctor, pharmacist, or health care provider.  2020 Elsevier/Gold Standard (2019-01-28 13:33:09)  

## 2020-05-22 NOTE — Telephone Encounter (Signed)
Scheduled per los. Gave avs and calendar  

## 2020-05-31 ENCOUNTER — Other Ambulatory Visit: Payer: Self-pay

## 2020-05-31 ENCOUNTER — Encounter: Payer: Self-pay | Admitting: Hematology

## 2020-05-31 DIAGNOSIS — C7B09 Secondary carcinoid tumors of other sites: Secondary | ICD-10-CM

## 2020-05-31 DIAGNOSIS — E876 Hypokalemia: Secondary | ICD-10-CM

## 2020-05-31 MED ORDER — POTASSIUM CHLORIDE CRYS ER 20 MEQ PO TBCR: 20.0000 meq | EXTENDED_RELEASE_TABLET | ORAL | 3 refills | Status: DC

## 2020-06-19 ENCOUNTER — Inpatient Hospital Stay: Payer: BC Managed Care – PPO | Attending: Hematology

## 2020-06-19 ENCOUNTER — Other Ambulatory Visit: Payer: Self-pay

## 2020-06-19 VITALS — BP 126/79 | HR 78 | Temp 98.3°F | Resp 18

## 2020-06-19 DIAGNOSIS — C7A012 Malignant carcinoid tumor of the ileum: Secondary | ICD-10-CM | POA: Diagnosis not present

## 2020-06-19 DIAGNOSIS — C7B09 Secondary carcinoid tumors of other sites: Secondary | ICD-10-CM

## 2020-06-19 DIAGNOSIS — Z79899 Other long term (current) drug therapy: Secondary | ICD-10-CM | POA: Diagnosis not present

## 2020-06-19 MED ORDER — OCTREOTIDE ACETATE 30 MG IM KIT
30.0000 mg | PACK | Freq: Once | INTRAMUSCULAR | Status: AC
  Administered 2020-06-19: 30 mg via INTRAMUSCULAR

## 2020-06-19 MED ORDER — OCTREOTIDE ACETATE 30 MG IM KIT
PACK | INTRAMUSCULAR | Status: AC
  Filled 2020-06-19: qty 1

## 2020-06-19 NOTE — Patient Instructions (Signed)
Octreotide injection solution What is this medicine? OCTREOTIDE (ok TREE oh tide) is used to reduce blood levels of growth hormone in patients with a condition called acromegaly. This medicine also reduces flushing and watery diarrhea caused by certain types of cancer. This medicine may be used for other purposes; ask your health care provider or pharmacist if you have questions. COMMON BRAND NAME(S): Bynfezia, Sandostatin What should I tell my health care provider before I take this medicine? They need to know if you have any of these conditions:  diabetes  gallbladder disease  kidney disease  liver disease  thyroid disease  an unusual or allergic reaction to octreotide, other medicines, foods, dyes, or preservatives  pregnant or trying to get pregnant  breast-feeding How should I use this medicine? This medicine is for injection under the skin or into a vein (only in emergency situations). It is usually given by a health care professional in a hospital or clinic setting. If you get this medicine at home, you will be taught how to prepare and give this medicine. Allow the injection solution to come to room temperature before use. Do not warm it artificially. Use exactly as directed. Take your medicine at regular intervals. Do not take your medicine more often than directed. It is important that you put your used needles and syringes in a special sharps container. Do not put them in a trash can. If you do not have a sharps container, call your pharmacist or healthcare provider to get one. Talk to your pediatrician regarding the use of this medicine in children. Special care may be needed. Overdosage: If you think you have taken too much of this medicine contact a poison control center or emergency room at once. NOTE: This medicine is only for you. Do not share this medicine with others. What if I miss a dose? If you miss a dose, take it as soon as you can. If it is almost time for your  next dose, take only that dose. Do not take double or extra doses. What may interact with this medicine?  bromocriptine  certain medicines for blood pressure, heart disease, irregular heartbeat  cyclosporine  diuretics  medicines for diabetes, including insulin  quinidine This list may not describe all possible interactions. Give your health care provider a list of all the medicines, herbs, non-prescription drugs, or dietary supplements you use. Also tell them if you smoke, drink alcohol, or use illegal drugs. Some items may interact with your medicine. What should I watch for while using this medicine? Visit your doctor or health care professional for regular checks on your progress. To help reduce irritation at the injection site, use a different site for each injection and make sure the solution is at room temperature before use. This medicine may cause decreases in blood sugar. Signs of low blood sugar include chills, cool, pale skin or cold sweats, drowsiness, extreme hunger, fast heartbeat, headache, nausea, nervousness or anxiety, shakiness, trembling, unsteadiness, tiredness, or weakness. Contact your doctor or health care professional right away if you experience any of these symptoms. This medicine may increase blood sugar. Ask your healthcare provider if changes in diet or medicines are needed if you have diabetes. This medicine may cause a decrease in vitamin B12. You should make sure that you get enough vitamin B12 while you are taking this medicine. Discuss the foods you eat and the vitamins you take with your health care professional. What side effects may I notice from receiving this medicine? Side   effects that you should report to your doctor or health care professional as soon as possible:  allergic reactions like skin rash, itching or hives, swelling of the face, lips, or tongue  fast, slow, or irregular heartbeat  right upper belly pain  severe stomach pain  signs  and symptoms of high blood sugar such as being more thirsty or hungry or having to urinate more than normal. You may also feel very tired or have blurry vision.  signs and symptoms of low blood sugar such as feeling anxious; confusion; dizziness; increased hunger; unusually weak or tired; increased sweating; shakiness; cold, clammy skin; irritable; headache; blurred vision; fast heartbeat; loss of consciousness  unusually weak or tired Side effects that usually do not require medical attention (report to your doctor or health care professional if they continue or are bothersome):  diarrhea  dizziness  gas  headache  nausea, vomiting  pain, redness, or irritation at site where injected  upset stomach This list may not describe all possible side effects. Call your doctor for medical advice about side effects. You may report side effects to FDA at 1-800-FDA-1088. Where should I keep my medicine? Keep out of the reach of children. Store in a refrigerator between 2 and 8 degrees C (36 and 46 degrees F). Protect from light. Allow to come to room temperature naturally. Do not use artificial heat. If protected from light, the injection may be stored at room temperature between 20 and 30 degrees C (70 and 86 degrees F) for 14 days. After the initial use, throw away any unused portion of a multiple dose vial after 14 days. Throw away unused portions of the ampules after use. NOTE: This sheet is a summary. It may not cover all possible information. If you have questions about this medicine, talk to your doctor, pharmacist, or health care provider.  2020 Elsevier/Gold Standard (2019-01-28 13:33:09)  

## 2020-06-22 DIAGNOSIS — Z13220 Encounter for screening for lipoid disorders: Secondary | ICD-10-CM | POA: Diagnosis not present

## 2020-06-22 DIAGNOSIS — Z131 Encounter for screening for diabetes mellitus: Secondary | ICD-10-CM | POA: Diagnosis not present

## 2020-06-22 DIAGNOSIS — Z0000 Encounter for general adult medical examination without abnormal findings: Secondary | ICD-10-CM | POA: Diagnosis not present

## 2020-07-11 ENCOUNTER — Encounter: Payer: Self-pay | Admitting: Hematology

## 2020-07-17 ENCOUNTER — Inpatient Hospital Stay: Payer: BC Managed Care – PPO

## 2020-07-17 ENCOUNTER — Inpatient Hospital Stay: Payer: BC Managed Care – PPO | Attending: Hematology

## 2020-07-17 ENCOUNTER — Other Ambulatory Visit: Payer: Self-pay

## 2020-07-17 VITALS — BP 163/88 | HR 77 | Temp 97.7°F | Resp 18

## 2020-07-17 DIAGNOSIS — Z79899 Other long term (current) drug therapy: Secondary | ICD-10-CM | POA: Insufficient documentation

## 2020-07-17 DIAGNOSIS — C7B09 Secondary carcinoid tumors of other sites: Secondary | ICD-10-CM

## 2020-07-17 DIAGNOSIS — C7A012 Malignant carcinoid tumor of the ileum: Secondary | ICD-10-CM | POA: Diagnosis not present

## 2020-07-17 LAB — CBC WITH DIFFERENTIAL (CANCER CENTER ONLY)
Abs Immature Granulocytes: 0.01 10*3/uL (ref 0.00–0.07)
Basophils Absolute: 0.1 10*3/uL (ref 0.0–0.1)
Basophils Relative: 1 %
Eosinophils Absolute: 0.1 10*3/uL (ref 0.0–0.5)
Eosinophils Relative: 1 %
HCT: 43.4 % (ref 36.0–46.0)
Hemoglobin: 14.3 g/dL (ref 12.0–15.0)
Immature Granulocytes: 0 %
Lymphocytes Relative: 33 %
Lymphs Abs: 2 10*3/uL (ref 0.7–4.0)
MCH: 29.9 pg (ref 26.0–34.0)
MCHC: 32.9 g/dL (ref 30.0–36.0)
MCV: 90.8 fL (ref 80.0–100.0)
Monocytes Absolute: 0.6 10*3/uL (ref 0.1–1.0)
Monocytes Relative: 10 %
Neutro Abs: 3.4 10*3/uL (ref 1.7–7.7)
Neutrophils Relative %: 55 %
Platelet Count: 249 10*3/uL (ref 150–400)
RBC: 4.78 MIL/uL (ref 3.87–5.11)
RDW: 12.5 % (ref 11.5–15.5)
WBC Count: 6.1 10*3/uL (ref 4.0–10.5)
nRBC: 0 % (ref 0.0–0.2)

## 2020-07-17 LAB — CMP (CANCER CENTER ONLY)
ALT: 40 U/L (ref 0–44)
AST: 38 U/L (ref 15–41)
Albumin: 4 g/dL (ref 3.5–5.0)
Alkaline Phosphatase: 58 U/L (ref 38–126)
Anion gap: 5 (ref 5–15)
BUN: 12 mg/dL (ref 8–23)
CO2: 28 mmol/L (ref 22–32)
Calcium: 9.1 mg/dL (ref 8.9–10.3)
Chloride: 108 mmol/L (ref 98–111)
Creatinine: 0.8 mg/dL (ref 0.44–1.00)
GFR, Estimated: >60 mL/min (ref >60)
Glucose, Bld: 113 mg/dL — ABNORMAL HIGH (ref 70–99)
Potassium: 3.8 mmol/L (ref 3.5–5.1)
Sodium: 141 mmol/L (ref 135–145)
Total Bilirubin: 0.7 mg/dL (ref 0.3–1.2)
Total Protein: 7.4 g/dL (ref 6.5–8.1)

## 2020-07-17 MED ORDER — OCTREOTIDE ACETATE 30 MG IM KIT
PACK | INTRAMUSCULAR | Status: AC
  Filled 2020-07-17: qty 1

## 2020-07-17 MED ORDER — OCTREOTIDE ACETATE 30 MG IM KIT
30.0000 mg | PACK | Freq: Once | INTRAMUSCULAR | Status: AC
  Administered 2020-07-17: 30 mg via INTRAMUSCULAR

## 2020-07-17 NOTE — Patient Instructions (Signed)
Octreotide injection solution What is this medicine? OCTREOTIDE (ok TREE oh tide) is used to reduce blood levels of growth hormone in patients with a condition called acromegaly. This medicine also reduces flushing and watery diarrhea caused by certain types of cancer. This medicine may be used for other purposes; ask your health care provider or pharmacist if you have questions. COMMON BRAND NAME(S): Bynfezia, Sandostatin What should I tell my health care provider before I take this medicine? They need to know if you have any of these conditions:  diabetes  gallbladder disease  kidney disease  liver disease  thyroid disease  an unusual or allergic reaction to octreotide, other medicines, foods, dyes, or preservatives  pregnant or trying to get pregnant  breast-feeding How should I use this medicine? This medicine is for injection under the skin or into a vein (only in emergency situations). It is usually given by a health care professional in a hospital or clinic setting. If you get this medicine at home, you will be taught how to prepare and give this medicine. Allow the injection solution to come to room temperature before use. Do not warm it artificially. Use exactly as directed. Take your medicine at regular intervals. Do not take your medicine more often than directed. It is important that you put your used needles and syringes in a special sharps container. Do not put them in a trash can. If you do not have a sharps container, call your pharmacist or healthcare provider to get one. Talk to your pediatrician regarding the use of this medicine in children. Special care may be needed. Overdosage: If you think you have taken too much of this medicine contact a poison control center or emergency room at once. NOTE: This medicine is only for you. Do not share this medicine with others. What if I miss a dose? If you miss a dose, take it as soon as you can. If it is almost time for your  next dose, take only that dose. Do not take double or extra doses. What may interact with this medicine?  bromocriptine  certain medicines for blood pressure, heart disease, irregular heartbeat  cyclosporine  diuretics  medicines for diabetes, including insulin  quinidine This list may not describe all possible interactions. Give your health care provider a list of all the medicines, herbs, non-prescription drugs, or dietary supplements you use. Also tell them if you smoke, drink alcohol, or use illegal drugs. Some items may interact with your medicine. What should I watch for while using this medicine? Visit your doctor or health care professional for regular checks on your progress. To help reduce irritation at the injection site, use a different site for each injection and make sure the solution is at room temperature before use. This medicine may cause decreases in blood sugar. Signs of low blood sugar include chills, cool, pale skin or cold sweats, drowsiness, extreme hunger, fast heartbeat, headache, nausea, nervousness or anxiety, shakiness, trembling, unsteadiness, tiredness, or weakness. Contact your doctor or health care professional right away if you experience any of these symptoms. This medicine may increase blood sugar. Ask your healthcare provider if changes in diet or medicines are needed if you have diabetes. This medicine may cause a decrease in vitamin B12. You should make sure that you get enough vitamin B12 while you are taking this medicine. Discuss the foods you eat and the vitamins you take with your health care professional. What side effects may I notice from receiving this medicine? Side   effects that you should report to your doctor or health care professional as soon as possible:  allergic reactions like skin rash, itching or hives, swelling of the face, lips, or tongue  fast, slow, or irregular heartbeat  right upper belly pain  severe stomach pain  signs  and symptoms of high blood sugar such as being more thirsty or hungry or having to urinate more than normal. You may also feel very tired or have blurry vision.  signs and symptoms of low blood sugar such as feeling anxious; confusion; dizziness; increased hunger; unusually weak or tired; increased sweating; shakiness; cold, clammy skin; irritable; headache; blurred vision; fast heartbeat; loss of consciousness  unusually weak or tired Side effects that usually do not require medical attention (report to your doctor or health care professional if they continue or are bothersome):  diarrhea  dizziness  gas  headache  nausea, vomiting  pain, redness, or irritation at site where injected  upset stomach This list may not describe all possible side effects. Call your doctor for medical advice about side effects. You may report side effects to FDA at 1-800-FDA-1088. Where should I keep my medicine? Keep out of the reach of children. Store in a refrigerator between 2 and 8 degrees C (36 and 46 degrees F). Protect from light. Allow to come to room temperature naturally. Do not use artificial heat. If protected from light, the injection may be stored at room temperature between 20 and 30 degrees C (70 and 86 degrees F) for 14 days. After the initial use, throw away any unused portion of a multiple dose vial after 14 days. Throw away unused portions of the ampules after use. NOTE: This sheet is a summary. It may not cover all possible information. If you have questions about this medicine, talk to your doctor, pharmacist, or health care provider.  2020 Elsevier/Gold Standard (2019-01-28 13:33:09)  

## 2020-07-18 LAB — CHROMOGRANIN A: Chromogranin A (ng/mL): 442.6 ng/mL — ABNORMAL HIGH (ref 0.0–101.8)

## 2020-08-09 ENCOUNTER — Telehealth: Payer: Self-pay | Admitting: Hematology

## 2020-08-09 NOTE — Telephone Encounter (Signed)
Called pt per 1/26 sch msg  - no answer. left message for pt to call back

## 2020-08-10 ENCOUNTER — Encounter: Payer: Self-pay | Admitting: Hematology

## 2020-08-10 ENCOUNTER — Other Ambulatory Visit: Payer: Self-pay

## 2020-08-10 DIAGNOSIS — C7B09 Secondary carcinoid tumors of other sites: Secondary | ICD-10-CM

## 2020-08-11 ENCOUNTER — Telehealth: Payer: Self-pay | Admitting: Hematology

## 2020-08-11 NOTE — Telephone Encounter (Signed)
LEFT MESSAGE - called pt per 1/27 sch msg , no answer , left message for pt with appt date and time

## 2020-08-14 ENCOUNTER — Inpatient Hospital Stay: Payer: BC Managed Care – PPO

## 2020-08-14 ENCOUNTER — Inpatient Hospital Stay: Payer: BC Managed Care – PPO | Admitting: Hematology

## 2020-08-16 ENCOUNTER — Ambulatory Visit (HOSPITAL_COMMUNITY): Payer: BC Managed Care – PPO

## 2020-08-16 ENCOUNTER — Encounter: Payer: Self-pay | Admitting: Hematology

## 2020-08-21 ENCOUNTER — Inpatient Hospital Stay: Payer: BC Managed Care – PPO | Attending: Hematology

## 2020-08-21 ENCOUNTER — Ambulatory Visit: Payer: BC Managed Care – PPO

## 2020-08-21 ENCOUNTER — Inpatient Hospital Stay: Payer: BC Managed Care – PPO

## 2020-08-21 ENCOUNTER — Other Ambulatory Visit: Payer: BC Managed Care – PPO

## 2020-08-21 ENCOUNTER — Other Ambulatory Visit: Payer: Self-pay

## 2020-08-21 VITALS — BP 152/78 | HR 76 | Temp 98.0°F | Resp 16

## 2020-08-21 DIAGNOSIS — Z79899 Other long term (current) drug therapy: Secondary | ICD-10-CM | POA: Insufficient documentation

## 2020-08-21 DIAGNOSIS — C7B09 Secondary carcinoid tumors of other sites: Secondary | ICD-10-CM

## 2020-08-21 DIAGNOSIS — C7A012 Malignant carcinoid tumor of the ileum: Secondary | ICD-10-CM

## 2020-08-21 LAB — CBC WITH DIFFERENTIAL (CANCER CENTER ONLY)
Abs Immature Granulocytes: 0.01 10*3/uL (ref 0.00–0.07)
Basophils Absolute: 0.1 10*3/uL (ref 0.0–0.1)
Basophils Relative: 1 %
Eosinophils Absolute: 0.1 10*3/uL (ref 0.0–0.5)
Eosinophils Relative: 1 %
HCT: 43.7 % (ref 36.0–46.0)
Hemoglobin: 13.6 g/dL (ref 12.0–15.0)
Immature Granulocytes: 0 %
Lymphocytes Relative: 35 %
Lymphs Abs: 2 10*3/uL (ref 0.7–4.0)
MCH: 28.9 pg (ref 26.0–34.0)
MCHC: 31.1 g/dL (ref 30.0–36.0)
MCV: 93 fL (ref 80.0–100.0)
Monocytes Absolute: 0.5 10*3/uL (ref 0.1–1.0)
Monocytes Relative: 8 %
Neutro Abs: 3.1 10*3/uL (ref 1.7–7.7)
Neutrophils Relative %: 55 %
Platelet Count: 252 10*3/uL (ref 150–400)
RBC: 4.7 MIL/uL (ref 3.87–5.11)
RDW: 12.8 % (ref 11.5–15.5)
WBC Count: 5.7 10*3/uL (ref 4.0–10.5)
nRBC: 0 % (ref 0.0–0.2)

## 2020-08-21 LAB — CMP (CANCER CENTER ONLY)
ALT: 35 U/L (ref 0–44)
AST: 36 U/L (ref 15–41)
Albumin: 4.1 g/dL (ref 3.5–5.0)
Alkaline Phosphatase: 59 U/L (ref 38–126)
Anion gap: 8 (ref 5–15)
BUN: 18 mg/dL (ref 8–23)
CO2: 29 mmol/L (ref 22–32)
Calcium: 8.8 mg/dL — ABNORMAL LOW (ref 8.9–10.3)
Chloride: 105 mmol/L (ref 98–111)
Creatinine: 0.85 mg/dL (ref 0.44–1.00)
GFR, Estimated: >60 mL/min (ref >60)
Glucose, Bld: 85 mg/dL (ref 70–99)
Potassium: 3.6 mmol/L (ref 3.5–5.1)
Sodium: 142 mmol/L (ref 135–145)
Total Bilirubin: 0.4 mg/dL (ref 0.3–1.2)
Total Protein: 7.4 g/dL (ref 6.5–8.1)

## 2020-08-21 MED ORDER — OCTREOTIDE ACETATE 30 MG IM KIT
30.0000 mg | PACK | Freq: Once | INTRAMUSCULAR | Status: AC
  Administered 2020-08-21: 30 mg via INTRAMUSCULAR

## 2020-08-21 MED ORDER — OCTREOTIDE ACETATE 30 MG IM KIT
PACK | INTRAMUSCULAR | Status: AC
  Filled 2020-08-21: qty 1

## 2020-08-21 NOTE — Patient Instructions (Signed)
Octreotide injection solution What is this medicine? OCTREOTIDE (ok TREE oh tide) is used to reduce blood levels of growth hormone in patients with a condition called acromegaly. This medicine also reduces flushing and watery diarrhea caused by certain types of cancer. This medicine may be used for other purposes; ask your health care provider or pharmacist if you have questions. COMMON BRAND NAME(S): Bynfezia, Sandostatin What should I tell my health care provider before I take this medicine? They need to know if you have any of these conditions:  diabetes  gallbladder disease  kidney disease  liver disease  thyroid disease  an unusual or allergic reaction to octreotide, other medicines, foods, dyes, or preservatives  pregnant or trying to get pregnant  breast-feeding How should I use this medicine? This medicine is for injection under the skin or into a vein (only in emergency situations). It is usually given by a health care professional in a hospital or clinic setting. If you get this medicine at home, you will be taught how to prepare and give this medicine. Allow the injection solution to come to room temperature before use. Do not warm it artificially. Use exactly as directed. Take your medicine at regular intervals. Do not take your medicine more often than directed. It is important that you put your used needles and syringes in a special sharps container. Do not put them in a trash can. If you do not have a sharps container, call your pharmacist or healthcare provider to get one. Talk to your pediatrician regarding the use of this medicine in children. Special care may be needed. Overdosage: If you think you have taken too much of this medicine contact a poison control center or emergency room at once. NOTE: This medicine is only for you. Do not share this medicine with others. What if I miss a dose? If you miss a dose, take it as soon as you can. If it is almost time for your  next dose, take only that dose. Do not take double or extra doses. What may interact with this medicine?  bromocriptine  certain medicines for blood pressure, heart disease, irregular heartbeat  cyclosporine  diuretics  medicines for diabetes, including insulin  quinidine This list may not describe all possible interactions. Give your health care provider a list of all the medicines, herbs, non-prescription drugs, or dietary supplements you use. Also tell them if you smoke, drink alcohol, or use illegal drugs. Some items may interact with your medicine. What should I watch for while using this medicine? Visit your doctor or health care professional for regular checks on your progress. To help reduce irritation at the injection site, use a different site for each injection and make sure the solution is at room temperature before use. This medicine may cause decreases in blood sugar. Signs of low blood sugar include chills, cool, pale skin or cold sweats, drowsiness, extreme hunger, fast heartbeat, headache, nausea, nervousness or anxiety, shakiness, trembling, unsteadiness, tiredness, or weakness. Contact your doctor or health care professional right away if you experience any of these symptoms. This medicine may increase blood sugar. Ask your healthcare provider if changes in diet or medicines are needed if you have diabetes. This medicine may cause a decrease in vitamin B12. You should make sure that you get enough vitamin B12 while you are taking this medicine. Discuss the foods you eat and the vitamins you take with your health care professional. What side effects may I notice from receiving this medicine? Side   effects that you should report to your doctor or health care professional as soon as possible:  allergic reactions like skin rash, itching or hives, swelling of the face, lips, or tongue  fast, slow, or irregular heartbeat  right upper belly pain  severe stomach pain  signs  and symptoms of high blood sugar such as being more thirsty or hungry or having to urinate more than normal. You may also feel very tired or have blurry vision.  signs and symptoms of low blood sugar such as feeling anxious; confusion; dizziness; increased hunger; unusually weak or tired; increased sweating; shakiness; cold, clammy skin; irritable; headache; blurred vision; fast heartbeat; loss of consciousness  unusually weak or tired Side effects that usually do not require medical attention (report to your doctor or health care professional if they continue or are bothersome):  diarrhea  dizziness  gas  headache  nausea, vomiting  pain, redness, or irritation at site where injected  upset stomach This list may not describe all possible side effects. Call your doctor for medical advice about side effects. You may report side effects to FDA at 1-800-FDA-1088. Where should I keep my medicine? Keep out of the reach of children. Store in a refrigerator between 2 and 8 degrees C (36 and 46 degrees F). Protect from light. Allow to come to room temperature naturally. Do not use artificial heat. If protected from light, the injection may be stored at room temperature between 20 and 30 degrees C (70 and 86 degrees F) for 14 days. After the initial use, throw away any unused portion of a multiple dose vial after 14 days. Throw away unused portions of the ampules after use. NOTE: This sheet is a summary. It may not cover all possible information. If you have questions about this medicine, talk to your doctor, pharmacist, or health care provider.  2021 Elsevier/Gold Standard (2019-01-28 13:33:09)  

## 2020-09-04 ENCOUNTER — Other Ambulatory Visit: Payer: Self-pay

## 2020-09-04 ENCOUNTER — Encounter (HOSPITAL_COMMUNITY): Payer: Self-pay

## 2020-09-04 ENCOUNTER — Ambulatory Visit (HOSPITAL_COMMUNITY): Payer: BC Managed Care – PPO

## 2020-09-04 ENCOUNTER — Ambulatory Visit (HOSPITAL_COMMUNITY)
Admission: RE | Admit: 2020-09-04 | Discharge: 2020-09-04 | Disposition: A | Discharge status: Home / Self Care | Payer: BC Managed Care – PPO | Source: Ambulatory Visit | Attending: Hematology | Admitting: Hematology

## 2020-09-04 DIAGNOSIS — K8020 Calculus of gallbladder without cholecystitis without obstruction: Secondary | ICD-10-CM | POA: Diagnosis not present

## 2020-09-04 DIAGNOSIS — K8080 Other cholelithiasis without obstruction: Secondary | ICD-10-CM | POA: Diagnosis not present

## 2020-09-04 DIAGNOSIS — N200 Calculus of kidney: Secondary | ICD-10-CM | POA: Diagnosis not present

## 2020-09-04 DIAGNOSIS — C7A012 Malignant carcinoid tumor of the ileum: Secondary | ICD-10-CM | POA: Diagnosis not present

## 2020-09-04 DIAGNOSIS — C7B09 Secondary carcinoid tumors of other sites: Secondary | ICD-10-CM

## 2020-09-04 DIAGNOSIS — I700 Atherosclerosis of aorta: Secondary | ICD-10-CM | POA: Diagnosis not present

## 2020-09-04 DIAGNOSIS — Z79899 Other long term (current) drug therapy: Secondary | ICD-10-CM | POA: Diagnosis not present

## 2020-09-04 DIAGNOSIS — M47816 Spondylosis without myelopathy or radiculopathy, lumbar region: Secondary | ICD-10-CM | POA: Diagnosis not present

## 2020-09-04 DIAGNOSIS — K5730 Diverticulosis of large intestine without perforation or abscess without bleeding: Secondary | ICD-10-CM | POA: Diagnosis not present

## 2020-09-04 DIAGNOSIS — C787 Secondary malignant neoplasm of liver and intrahepatic bile duct: Secondary | ICD-10-CM | POA: Diagnosis not present

## 2020-09-04 MED ORDER — IOHEXOL 300 MG/ML  SOLN
100.0000 mL | Freq: Once | INTRAMUSCULAR | Status: AC | PRN
  Administered 2020-09-04: 100 mL via INTRAVENOUS

## 2020-09-06 ENCOUNTER — Ambulatory Visit: Payer: BC Managed Care – PPO | Admitting: Hematology

## 2020-09-12 LAB — 5 HIAA, QUANTITATIVE, URINE, 24 HOUR
5-HIAA, Ur: 50.5 mg/L
5-HIAA,Quant.,24 Hr Urine: 45.5 mg/24 hr — ABNORMAL HIGH (ref 0.0–14.9)
Total Volume: 900

## 2020-09-15 NOTE — Progress Notes (Signed)
[place]   Telephone:([phone]) [phone] Fax:(336) [phone]   Clinic Follow up Note   Patient Care Team: [place], [place] as PCP - General  Date of Service:  09/18/2020  CHIEF COMPLAINT: F/u of Carcinoid tumor  SUMMARY OF ONCOLOGIC HISTORY: Oncology History Overview Note  Cancer Staging No matching staging information was found for the patient.    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    08/17/2005 Surgery   Carcinoid tumor of the ileum following several years of paroxysmal abdominal pain. The patient underwent surgical resection of her carcinoid tumor on 08/17/2005. Two out of 13 lymph nodes were positive. The patient's stage was III. The patient was noted to have an elevated urinary 5HIAA level in July 2011.   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 Sandostatin LAR increased to 60 mg IM every 4 weeks on 08/27/2017 due to disease progression. Held from 12/01/2017 per pt's request, restarted at 30mg  IM every 4 weeks on 05/11/2018    10/19/2012 Imaging   CT scan of the abdomen and pelvis with IV contrast on 10/19/2012 showed generally stable disease with the exception of a decrease in the right abdominal mesenteric lymphadenopathy. There have been 2 lesions in the liver which are felt to be stable. One lesion is in the lateral segment of the left hepatic lobe measuring 14 x 14 mm. The second lesion is seen in the lateral segment adjacent to the falciform ligament and measures 9 mm.     08/20/2017 PET scan   [facility] PET  IMPRESSION: 1. Intense radiotracer activity associated with multiple hyperdense liver lesions consistent with well differentiated neuroendocrine tumor hepatic metastasis. 2. Multiple  small foci peritoneal metastasis within the lower abdomen and pelvis. 3. Large mass associated the RIGHT adnexa and small lesions in LEFT adnexa also with intense radiotracer activity consistent with neuroendocrine tumor metastasis to the adnexa. 4. No evidence of pulmonary metastasis or skeletal metastasis.   08/24/2018 Imaging   CT AP W Contrast 08/24/18  IMPRESSION: 1. Multiple metastatic lesions are unchanged from the most recent prior CT. There are multiple hypervascular liver lesions as well as omental and peritoneal masses, which show no increase in size or number from the prior CT. No evidence of new metastatic disease. 2. No acute findings. 3. Hepatic steatosis.     Imaging   A positive octreotide scan was noted coming from the right lower pelvis adjacent to the bladder on 09/06/2010. Initially this was felt to be a uterine fibroid. The patient has continued to have positive octreotide scan on 11/07/2011 and 05/06/2012. CT scans of the abdomen and pelvis were also carried out on 03/02/2010, 02/24/2012 and 06/30/2012. These scans in conjunction with an elevated 5HIAA level of 22.7 on 04/13/2012 and a chromogranin A level which had risen to 18.0 on 03/23/2012 strongly suggested that the patient's disease had recurred.    08/24/2018 Imaging   CT AP W Contrast 08/24/18  IMPRESSION: 1. Multiple metastatic lesions are unchanged from the most recent prior CT. There are multiple hypervascular liver lesions as well as omental and peritoneal masses, which show no increase in size or number from the prior CT. No evidence of new metastatic disease. 2. No acute findings. 3. Hepatic  steatosis.   01/29/2019 Imaging   CT AP W Contrast 01/29/19 IMPRESSION: 1. Essentially stable metastatic carcinoid tumor/neuroendocrine tumor. 2. Multiple round enhancing hepatic lesions unchanged. 3. Bulky peritoneal mass in the posterior cul-de-sac stable. 4. Stable small peritoneal and mesenteric  metastasis. 5. No bowel obstruction. Serosal implant along the anti mesenteric border of the cecum unchanged.   07/12/2019 Imaging   CT AP W Contrast  IMPRESSION: 1. Essentially stable appearance of the scattered hepatic and peritoneal metastatic lesions. 2. Right nephrolithiasis. 3. Aortic atherosclerosis. 4. Stable appearance of larger tumor implants along the adnexa and along the fundal and posterior margins of the uterus. 5. Mild lower lumbar spondylosis and degenerative disc disease.   Aortic Atherosclerosis (ICD10-I70.0).   02/25/2020 Imaging   CT CAP w contrast IMPRESSION: 1. Overall, imaging findings are stable to minimally progressed in the interval. 2. Multiple hypervascular liver metastases are similar to minimally progressed since 07/12/2019. These are best visualized on arterial phase imaging. No new liver lesion on today's exam. 3. Stable hepatoduodenal ligament lymph nodes and pelvic metastases are stable. 4. Omental/mesenteric nodules measure minimally larger today. 5. Stable 4 mm posterior right lower lobe pulmonary nodule. Continued attention on follow-up recommended. 6. Aortic Atherosclerosis (ICD10-I70.0).     09/04/2020 Imaging   CT CAP  IMPRESSION: 1. Hypervascular liver metastases are stable to mildly increased as detailed. 2. Small peritoneal metastases scattered in the omentum, deep pelvis and along the liver capsule, stable to minimally increased as detailed. 3. Stable mild central mesenteric adenopathy. 4. Tiny right lower lobe pulmonary nodule is stable. No new or progressive findings in the chest. 5. Chronic findings include: Aortic Atherosclerosis (ICD10-I70.0). Cholelithiasis. Nonobstructing right nephrolithiasis. Mild left colonic diverticulosis.      CURRENT THERAPY:  Sandostatin LAR 30 mg IM every 4 weeks, started on 07/07/2012. Sandostatin LAR increased to 60 mg IM every 4 weeks on 08/27/2017 due to disease progression. Held from  12/01/2017 per pt's request, restarted at 30mg  IM every 4 weeks on 05/11/2018  INTERVAL HISTORY:  Vanessa Waller is here for a follow up of carcinoid tumor. She presents to the clinic alone. She notes she is doing well with no major side effects. She is eating well and maintaining weight. She denies hot flashes. She notes she plans to retire in first half of 2021. 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.     REVIEW OF SYSTEMS:   Constitutional: Denies fevers, chills or abnormal weight loss Eyes: Denies blurriness of vision Ears, nose, mouth, throat, and face: Denies mucositis or sore throat Respiratory: Denies cough, dyspnea or wheezes Cardiovascular: Denies palpitation, chest discomfort or lower extremity swelling Gastrointestinal:  Denies nausea, heartburn or change in bowel habits Skin: Denies abnormal skin rashes Lymphatics: Denies new lymphadenopathy or easy bruising Neurological:Denies numbness, tingling or new weaknesses Behavioral/Psych: Mood is stable, no new changes  All other systems were reviewed with the patient and are negative.  MEDICAL HISTORY:  Past Medical History:  Diagnosis Date  . 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 carcinoid tumor dx'd 08/2005   small interstine  . Diverticulosis   . Hepatic steatosis 02/24/12  . History of kidney stones    x 2   . Hypopotassemia   . 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 Nonspecific elevation of levels of transaminase or lactic acid dehydrogenase (LDH)   . Small bowel obstruction ([place]) 2007  . Uterine fibroid  SURGICAL HISTORY: Past Surgical History:  Procedure Laterality Date  . carcinoid tumor resection  2007   ileum  . colonscopy  not sure  . CYSTOSCOPY WITH URETEROSCOPY, STONE BASKETRY AND STENT PLACEMENT Right 10/14/2019   Procedure: CYSTOSCOPY WITH RIGHT URETEROSCOPY, LASER LITHOTRIPSY STONE BASKETRY AND RIGHT  STENT PLACEMENT;   Surgeon: Ardis Hughs, MD;  Location[facility];  Service: Urology;  Laterality: Right;  . CYSTOSCOPY/URETEROSCOPY/HOLMIUM LASER/STENT PLACEMENT Right 01/16/2018   Procedure: RIGHT URETEROSCOPY/RIGHT RETROGRADE/HOLMIUM LASER/STENT PLACEMENT;  Surgeon: Ardis Hughs, MD;  Location: [facility];  Service: Urology;  Laterality: Right;  . TUBAL LIGATION      I have reviewed the social history and family history with the patient and they are unchanged from previous note.  ALLERGIES:  is allergic to cephalexin and amoxicillin.  MEDICATIONS:  Current Outpatient Medications  Medication Sig Dispense Refill  . Ascorbic Acid (VITAMIN C) 500 MG CAPS Take 1 capsule by mouth daily.    . Biotin 10 MG CAPS Take by mouth.    . calcium acetate (PHOSLO) 667 MG capsule Take by mouth every evening.     . Cholecalciferol (VITAMIN D-3) 5000 UNITS TABS Take 1 tablet by mouth every evening.     . cyanocobalamin 1000 MCG tablet Take 1,000 mcg by mouth daily.    [name] loperamide (IMODIUM) 2 MG capsule Take by mouth as needed for diarrhea or loose stools.    . Multiple Vitamin (MULTIVITAMIN) tablet Take 1 tablet by mouth every evening.     [name] octreotide (SANDOSTATIN LAR) 30 MG injection Inject 30 mg into the muscle every 28 (twenty-eight) days.    . potassium chloride SA (KLOR-CON) 20 MEQ tablet Take 1 tablet (20 mEq total) by mouth every other day. 45 tablet 3  . pyridOXINE (VITAMIN B-6) 100 MG tablet Take 100 mg by mouth daily.     No current facility-administered medications for this visit.    PHYSICAL EXAMINATION: ECOG PERFORMANCE STATUS: 1 - Symptomatic but completely ambulatory  Vitals:   09/18/20 1439  BP: 132/75  Pulse: 87  Resp: 17  Temp: 98.9 F (37.2 C)  SpO2: 98%   Filed Weights   09/18/20 1439  Weight: 136 lb 1.6 oz (61.7 kg)    Due to COVID19 we will limit examination to appearance. Patient had no complaints.  GENERAL:alert, no distress and  comfortable SKIN: skin color normal, no rashes or significant lesions EYES: normal, Conjunctiva are pink and non-injected, sclera clear  NEURO: alert & oriented x 3 with fluent speech   LABORATORY DATA:  I have reviewed the data as listed CBC Latest Ref Rng & Units 08/21/2020 07/17/2020 04/24/2020  WBC 4.0 - 10.5 K/uL 5.7 6.1 5.7  Hemoglobin 12.0 - 15.0 g/dL 13.6 14.3 14.5  Hematocrit 36.0 - 46.0 % 43.7 43.4 45.3  Platelets 150 - 400 K/uL 252 249 271     CMP Latest Ref Rng & Units 08/21/2020 07/17/2020 04/24/2020  Glucose 70 - 99 mg/dL 85 113(H) 125(H)  BUN 8 - 23 mg/dL 18 12 11   Creatinine 0.44 - 1.00 mg/dL 0.85 0.80 0.91  Sodium 135 - 145 mmol/L 142 141 141  Potassium 3.5 - 5.1 mmol/L 3.6 3.8 3.3(L)  Chloride 98 - 111 mmol/L 105 108 105  CO2 22 - 32 mmol/L 29 28 28   Calcium 8.9 - 10.3 mg/dL 8.8(L) 9.1 9.6  Total Protein 6.5 - 8.1 g/dL 7.4 7.4 7.6  Total Bilirubin 0.3 - 1.2 mg/dL 0.4 0.7 0.6  Alkaline Phos 38 - 126 U/L 59 58 58  AST 15 - 41 U/L 36 38 33  ALT 0 - 44 U/L 35 40 28      RADIOGRAPHIC STUDIES: I have personally reviewed the radiological images as listed and agreed with the findings in the report. No results found.   ASSESSMENT & PLAN:  Vanessa Waller is a 65 y.o. female with   1. 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 She was treated with surgical resectionin 2/007.  -Sheunfortunatelyhas cancer recurrencewith metastasisto the liver, peritoneumand adnexa in late 2013. She has been on Sandostatin injections since 07/07/12. Her diarrhea very mildand she has been toleratingwell.  -Due to mild disease progression in earlier 2019, I increased her dose of Sandostatin injectiondose to 60mg , she came offSandostatin injectionafter 4/15/2019per her request, and restart 30mg  dose in 04/2018, she has been tolerating well. -I personally reviewed and discussed her CT CAP  from 09/04/20 and compared to her previous CT from 2020, which showed slightly increased liver metastases, tiny right lower lung nodule and stable peritoneal metastases. Her largest liver esion measure 2.6cm now (it was 1.7cm in 2020). I personally reviewed her imaging with patient today.  -I discussed although this progression is slow, it is now significant growth since 2 years ago. I recommend switching to [place] treatment to control her disease for longer. I also discussed oral Afinitor daily. I gave her print out of therapy information.  However giving the entrenched nature of her metastatic disease, and overall low disease burden, it is okay to continue Sandostatin injection if she wants to, or not ready to change treatment.  She is planning to retire in the next few months. -She is open to consult with Dr Leonia Reeves.. She will think about this and in the meantime she can continue Sandostatin.  -Labs reviewed from last month, CBC and CMP WNL except CA 8.8. Her 24hr urine protein has trended up to 45.5.  -Continue Sandostatin every 4 weeks  -F/u in 3 months   2. Hypokalemia -Continue oral potassium every 3 days. K normal recently, will continue oral potassium.   3. HTN -Follow-up with primary care physician. Not on medications   4. Cancer screening  -She is overdue for mammogram, last mammogram in July 2015. -She will continue follow up with her PCP and other specialists.   PLAN  -I refilled oral potassium.  -Send referral to Dr Leonia Reeves to discuss Lutathera treatment.  -Proceed with Sandostatin injection today  -Sandostatin injection every 4 weeksX3 -Lab and f/u in 12 weeks    No problem-specific Assessment & Plan notes found for this encounter.   No orders of the defined types were placed in this encounter.  All questions were answered. The patient knows to call the clinic with any problems, questions or concerns. No barriers to learning was detected. The total time spent  in the appointment was 30 minutes.     Truitt Merle, MD 09/18/2020   I, Joslyn Devon, am acting as scribe for Truitt Merle, MD.   I have reviewed the above documentation for accuracy and completeness, and I agree with the above.

## 2020-09-18 ENCOUNTER — Inpatient Hospital Stay: Payer: BC Managed Care – PPO | Attending: Hematology | Admitting: Hematology

## 2020-09-18 ENCOUNTER — Ambulatory Visit: Payer: BC Managed Care – PPO

## 2020-09-18 ENCOUNTER — Other Ambulatory Visit: Payer: BC Managed Care – PPO

## 2020-09-18 ENCOUNTER — Inpatient Hospital Stay: Payer: BC Managed Care – PPO

## 2020-09-18 ENCOUNTER — Ambulatory Visit: Payer: BC Managed Care – PPO | Admitting: Hematology

## 2020-09-18 ENCOUNTER — Encounter: Payer: Self-pay | Admitting: Hematology

## 2020-09-18 ENCOUNTER — Other Ambulatory Visit: Payer: Self-pay

## 2020-09-18 VITALS — BP 132/75 | HR 87 | Temp 98.9°F | Resp 17 | Ht 61.0 in | Wt 136.1 lb

## 2020-09-18 DIAGNOSIS — I700 Atherosclerosis of aorta: Secondary | ICD-10-CM | POA: Diagnosis not present

## 2020-09-18 DIAGNOSIS — Z79899 Other long term (current) drug therapy: Secondary | ICD-10-CM | POA: Insufficient documentation

## 2020-09-18 DIAGNOSIS — C786 Secondary malignant neoplasm of retroperitoneum and peritoneum: Secondary | ICD-10-CM | POA: Diagnosis not present

## 2020-09-18 DIAGNOSIS — M47816 Spondylosis without myelopathy or radiculopathy, lumbar region: Secondary | ICD-10-CM | POA: Insufficient documentation

## 2020-09-18 DIAGNOSIS — K760 Fatty (change of) liver, not elsewhere classified: Secondary | ICD-10-CM | POA: Diagnosis not present

## 2020-09-18 DIAGNOSIS — R911 Solitary pulmonary nodule: Secondary | ICD-10-CM | POA: Insufficient documentation

## 2020-09-18 DIAGNOSIS — Z87442 Personal history of urinary calculi: Secondary | ICD-10-CM | POA: Insufficient documentation

## 2020-09-18 DIAGNOSIS — C7A012 Malignant carcinoid tumor of the ileum: Secondary | ICD-10-CM | POA: Insufficient documentation

## 2020-09-18 DIAGNOSIS — E876 Hypokalemia: Secondary | ICD-10-CM | POA: Diagnosis not present

## 2020-09-18 DIAGNOSIS — M5136 Other intervertebral disc degeneration, lumbar region: Secondary | ICD-10-CM | POA: Diagnosis not present

## 2020-09-18 DIAGNOSIS — C7B09 Secondary carcinoid tumors of other sites: Secondary | ICD-10-CM | POA: Diagnosis not present

## 2020-09-18 DIAGNOSIS — K8020 Calculus of gallbladder without cholecystitis without obstruction: Secondary | ICD-10-CM | POA: Insufficient documentation

## 2020-09-18 DIAGNOSIS — I10 Essential (primary) hypertension: Secondary | ICD-10-CM | POA: Diagnosis not present

## 2020-09-18 DIAGNOSIS — C787 Secondary malignant neoplasm of liver and intrahepatic bile duct: Secondary | ICD-10-CM | POA: Insufficient documentation

## 2020-09-18 DIAGNOSIS — N200 Calculus of kidney: Secondary | ICD-10-CM | POA: Insufficient documentation

## 2020-09-18 MED ORDER — OCTREOTIDE ACETATE 30 MG IM KIT
PACK | INTRAMUSCULAR | Status: AC
  Filled 2020-09-18: qty 1

## 2020-09-18 MED ORDER — POTASSIUM CHLORIDE CRYS ER 20 MEQ PO TBCR: 20.0000 meq | EXTENDED_RELEASE_TABLET | ORAL | 3 refills | Status: DC

## 2020-09-18 MED ORDER — OCTREOTIDE ACETATE 30 MG IM KIT
30.0000 mg | PACK | Freq: Once | INTRAMUSCULAR | Status: AC
  Administered 2020-09-18: 30 mg via INTRAMUSCULAR

## 2020-09-18 NOTE — Patient Instructions (Signed)
Octreotide injection solution What is this medicine? OCTREOTIDE (ok TREE oh tide) is used to reduce blood levels of growth hormone in patients with a condition called acromegaly. This medicine also reduces flushing and watery diarrhea caused by certain types of cancer. This medicine may be used for other purposes; ask your health care provider or pharmacist if you have questions. COMMON BRAND NAME(S): Bynfezia, Sandostatin What should I tell my health care provider before I take this medicine? They need to know if you have any of these conditions:  diabetes  gallbladder disease  kidney disease  liver disease  thyroid disease  an unusual or allergic reaction to octreotide, other medicines, foods, dyes, or preservatives  pregnant or trying to get pregnant  breast-feeding How should I use this medicine? This medicine is for injection under the skin or into a vein (only in emergency situations). It is usually given by a health care professional in a hospital or clinic setting. If you get this medicine at home, you will be taught how to prepare and give this medicine. Allow the injection solution to come to room temperature before use. Do not warm it artificially. Use exactly as directed. Take your medicine at regular intervals. Do not take your medicine more often than directed. It is important that you put your used needles and syringes in a special sharps container. Do not put them in a trash can. If you do not have a sharps container, call your pharmacist or healthcare provider to get one. Talk to your pediatrician regarding the use of this medicine in children. Special care may be needed. Overdosage: If you think you have taken too much of this medicine contact a poison control center or emergency room at once. NOTE: This medicine is only for you. Do not share this medicine with others. What if I miss a dose? If you miss a dose, take it as soon as you can. If it is almost time for your  next dose, take only that dose. Do not take double or extra doses. What may interact with this medicine?  bromocriptine  certain medicines for blood pressure, heart disease, irregular heartbeat  cyclosporine  diuretics  medicines for diabetes, including insulin  quinidine This list may not describe all possible interactions. Give your health care provider a list of all the medicines, herbs, non-prescription drugs, or dietary supplements you use. Also tell them if you smoke, drink alcohol, or use illegal drugs. Some items may interact with your medicine. What should I watch for while using this medicine? Visit your doctor or health care professional for regular checks on your progress. To help reduce irritation at the injection site, use a different site for each injection and make sure the solution is at room temperature before use. This medicine may cause decreases in blood sugar. Signs of low blood sugar include chills, cool, pale skin or cold sweats, drowsiness, extreme hunger, fast heartbeat, headache, nausea, nervousness or anxiety, shakiness, trembling, unsteadiness, tiredness, or weakness. Contact your doctor or health care professional right away if you experience any of these symptoms. This medicine may increase blood sugar. Ask your healthcare provider if changes in diet or medicines are needed if you have diabetes. This medicine may cause a decrease in vitamin B12. You should make sure that you get enough vitamin B12 while you are taking this medicine. Discuss the foods you eat and the vitamins you take with your health care professional. What side effects may I notice from receiving this medicine? Side   effects that you should report to your doctor or health care professional as soon as possible:  allergic reactions like skin rash, itching or hives, swelling of the face, lips, or tongue  fast, slow, or irregular heartbeat  right upper belly pain  severe stomach pain  signs  and symptoms of high blood sugar such as being more thirsty or hungry or having to urinate more than normal. You may also feel very tired or have blurry vision.  signs and symptoms of low blood sugar such as feeling anxious; confusion; dizziness; increased hunger; unusually weak or tired; increased sweating; shakiness; cold, clammy skin; irritable; headache; blurred vision; fast heartbeat; loss of consciousness  unusually weak or tired Side effects that usually do not require medical attention (report to your doctor or health care professional if they continue or are bothersome):  diarrhea  dizziness  gas  headache  nausea, vomiting  pain, redness, or irritation at site where injected  upset stomach This list may not describe all possible side effects. Call your doctor for medical advice about side effects. You may report side effects to FDA at 1-800-FDA-1088. Where should I keep my medicine? Keep out of the reach of children. Store in a refrigerator between 2 and 8 degrees C (36 and 46 degrees F). Protect from light. Allow to come to room temperature naturally. Do not use artificial heat. If protected from light, the injection may be stored at room temperature between 20 and 30 degrees C (70 and 86 degrees F) for 14 days. After the initial use, throw away any unused portion of a multiple dose vial after 14 days. Throw away unused portions of the ampules after use. NOTE: This sheet is a summary. It may not cover all possible information. If you have questions about this medicine, talk to your doctor, pharmacist, or health care provider.  2021 Elsevier/Gold Standard (2019-01-28 13:33:09)  

## 2020-09-19 ENCOUNTER — Telehealth: Payer: Self-pay | Admitting: Hematology

## 2020-09-19 ENCOUNTER — Other Ambulatory Visit (HOSPITAL_COMMUNITY): Payer: Self-pay | Admitting: Hematology

## 2020-09-19 DIAGNOSIS — D3A098 Benign carcinoid tumors of other sites: Secondary | ICD-10-CM

## 2020-09-19 NOTE — Telephone Encounter (Signed)
Scheduled appt per 3/7 LOS - unable to reach pt . Left message for patient with appt date and time

## 2020-09-20 ENCOUNTER — Other Ambulatory Visit: Payer: Self-pay

## 2020-09-20 DIAGNOSIS — C7B09 Secondary carcinoid tumors of other sites: Secondary | ICD-10-CM

## 2020-09-20 DIAGNOSIS — C7A012 Malignant carcinoid tumor of the ileum: Secondary | ICD-10-CM

## 2020-10-10 ENCOUNTER — Ambulatory Visit (HOSPITAL_COMMUNITY): Payer: BC Managed Care – PPO

## 2020-10-11 ENCOUNTER — Encounter: Payer: Self-pay | Admitting: Hematology

## 2020-10-16 ENCOUNTER — Other Ambulatory Visit: Payer: Self-pay

## 2020-10-16 ENCOUNTER — Inpatient Hospital Stay: Payer: BC Managed Care – PPO | Attending: Hematology

## 2020-10-16 DIAGNOSIS — C7A012 Malignant carcinoid tumor of the ileum: Secondary | ICD-10-CM | POA: Insufficient documentation

## 2020-10-16 DIAGNOSIS — Z79899 Other long term (current) drug therapy: Secondary | ICD-10-CM | POA: Insufficient documentation

## 2020-10-16 DIAGNOSIS — C7B09 Secondary carcinoid tumors of other sites: Secondary | ICD-10-CM

## 2020-10-16 MED ORDER — OCTREOTIDE ACETATE 30 MG IM KIT
PACK | INTRAMUSCULAR | Status: AC
  Filled 2020-10-16: qty 1

## 2020-10-16 MED ORDER — OCTREOTIDE ACETATE 30 MG IM KIT
30.0000 mg | PACK | Freq: Once | INTRAMUSCULAR | Status: AC
  Administered 2020-10-16: 30 mg via INTRAMUSCULAR

## 2020-10-16 NOTE — Patient Instructions (Signed)
Octreotide injection solution What is this medicine? OCTREOTIDE (ok TREE oh tide) is used to reduce blood levels of growth hormone in patients with a condition called acromegaly. This medicine also reduces flushing and watery diarrhea caused by certain types of cancer. This medicine may be used for other purposes; ask your health care provider or pharmacist if you have questions. COMMON BRAND NAME(S): Bynfezia, Sandostatin What should I tell my health care provider before I take this medicine? They need to know if you have any of these conditions:  diabetes  gallbladder disease  kidney disease  liver disease  thyroid disease  an unusual or allergic reaction to octreotide, other medicines, foods, dyes, or preservatives  pregnant or trying to get pregnant  breast-feeding How should I use this medicine? This medicine is for injection under the skin or into a vein (only in emergency situations). It is usually given by a health care professional in a hospital or clinic setting. If you get this medicine at home, you will be taught how to prepare and give this medicine. Allow the injection solution to come to room temperature before use. Do not warm it artificially. Use exactly as directed. Take your medicine at regular intervals. Do not take your medicine more often than directed. It is important that you put your used needles and syringes in a special sharps container. Do not put them in a trash can. If you do not have a sharps container, call your pharmacist or healthcare provider to get one. Talk to your pediatrician regarding the use of this medicine in children. Special care may be needed. Overdosage: If you think you have taken too much of this medicine contact a poison control center or emergency room at once. NOTE: This medicine is only for you. Do not share this medicine with others. What if I miss a dose? If you miss a dose, take it as soon as you can. If it is almost time for your  next dose, take only that dose. Do not take double or extra doses. What may interact with this medicine?  bromocriptine  certain medicines for blood pressure, heart disease, irregular heartbeat  cyclosporine  diuretics  medicines for diabetes, including insulin  quinidine This list may not describe all possible interactions. Give your health care provider a list of all the medicines, herbs, non-prescription drugs, or dietary supplements you use. Also tell them if you smoke, drink alcohol, or use illegal drugs. Some items may interact with your medicine. What should I watch for while using this medicine? Visit your doctor or health care professional for regular checks on your progress. To help reduce irritation at the injection site, use a different site for each injection and make sure the solution is at room temperature before use. This medicine may cause decreases in blood sugar. Signs of low blood sugar include chills, cool, pale skin or cold sweats, drowsiness, extreme hunger, fast heartbeat, headache, nausea, nervousness or anxiety, shakiness, trembling, unsteadiness, tiredness, or weakness. Contact your doctor or health care professional right away if you experience any of these symptoms. This medicine may increase blood sugar. Ask your healthcare provider if changes in diet or medicines are needed if you have diabetes. This medicine may cause a decrease in vitamin B12. You should make sure that you get enough vitamin B12 while you are taking this medicine. Discuss the foods you eat and the vitamins you take with your health care professional. What side effects may I notice from receiving this medicine? Side   effects that you should report to your doctor or health care professional as soon as possible:  allergic reactions like skin rash, itching or hives, swelling of the face, lips, or tongue  fast, slow, or irregular heartbeat  right upper belly pain  severe stomach pain  signs  and symptoms of high blood sugar such as being more thirsty or hungry or having to urinate more than normal. You may also feel very tired or have blurry vision.  signs and symptoms of low blood sugar such as feeling anxious; confusion; dizziness; increased hunger; unusually weak or tired; increased sweating; shakiness; cold, clammy skin; irritable; headache; blurred vision; fast heartbeat; loss of consciousness  unusually weak or tired Side effects that usually do not require medical attention (report to your doctor or health care professional if they continue or are bothersome):  diarrhea  dizziness  gas  headache  nausea, vomiting  pain, redness, or irritation at site where injected  upset stomach This list may not describe all possible side effects. Call your doctor for medical advice about side effects. You may report side effects to FDA at 1-800-FDA-1088. Where should I keep my medicine? Keep out of the reach of children. Store in a refrigerator between 2 and 8 degrees C (36 and 46 degrees F). Protect from light. Allow to come to room temperature naturally. Do not use artificial heat. If protected from light, the injection may be stored at room temperature between 20 and 30 degrees C (70 and 86 degrees F) for 14 days. After the initial use, throw away any unused portion of a multiple dose vial after 14 days. Throw away unused portions of the ampules after use. NOTE: This sheet is a summary. It may not cover all possible information. If you have questions about this medicine, talk to your doctor, pharmacist, or health care provider.  2021 Elsevier/Gold Standard (2019-01-28 13:33:09)  

## 2020-10-20 ENCOUNTER — Encounter: Payer: Self-pay | Admitting: Hematology

## 2020-11-13 ENCOUNTER — Inpatient Hospital Stay: Payer: BC Managed Care – PPO | Attending: Hematology

## 2020-11-13 ENCOUNTER — Other Ambulatory Visit: Payer: Self-pay

## 2020-11-13 VITALS — BP 155/84 | HR 71 | Temp 98.5°F | Resp 16

## 2020-11-13 DIAGNOSIS — Z79899 Other long term (current) drug therapy: Secondary | ICD-10-CM | POA: Diagnosis not present

## 2020-11-13 DIAGNOSIS — C7B09 Secondary carcinoid tumors of other sites: Secondary | ICD-10-CM

## 2020-11-13 DIAGNOSIS — C7A012 Malignant carcinoid tumor of the ileum: Secondary | ICD-10-CM

## 2020-11-13 MED ORDER — OCTREOTIDE ACETATE 30 MG IM KIT
PACK | INTRAMUSCULAR | Status: AC
  Filled 2020-11-13: qty 1

## 2020-11-13 MED ORDER — OCTREOTIDE ACETATE 30 MG IM KIT
30.0000 mg | PACK | Freq: Once | INTRAMUSCULAR | Status: AC
  Administered 2020-11-13: 30 mg via INTRAMUSCULAR

## 2020-11-13 NOTE — Patient Instructions (Signed)
Octreotide injection solution What is this medicine? OCTREOTIDE (ok TREE oh tide) is used to reduce blood levels of growth hormone in patients with a condition called acromegaly. This medicine also reduces flushing and watery diarrhea caused by certain types of cancer. This medicine may be used for other purposes; ask your health care provider or pharmacist if you have questions. COMMON BRAND NAME(S): Bynfezia, Sandostatin What should I tell my health care provider before I take this medicine? They need to know if you have any of these conditions:  diabetes  gallbladder disease  kidney disease  liver disease  thyroid disease  an unusual or allergic reaction to octreotide, other medicines, foods, dyes, or preservatives  pregnant or trying to get pregnant  breast-feeding How should I use this medicine? This medicine is for injection under the skin or into a vein (only in emergency situations). It is usually given by a health care professional in a hospital or clinic setting. If you get this medicine at home, you will be taught how to prepare and give this medicine. Allow the injection solution to come to room temperature before use. Do not warm it artificially. Use exactly as directed. Take your medicine at regular intervals. Do not take your medicine more often than directed. It is important that you put your used needles and syringes in a special sharps container. Do not put them in a trash can. If you do not have a sharps container, call your pharmacist or healthcare provider to get one. Talk to your pediatrician regarding the use of this medicine in children. Special care may be needed. Overdosage: If you think you have taken too much of this medicine contact a poison control center or emergency room at once. NOTE: This medicine is only for you. Do not share this medicine with others. What if I miss a dose? If you miss a dose, take it as soon as you can. If it is almost time for your  next dose, take only that dose. Do not take double or extra doses. What may interact with this medicine?  bromocriptine  certain medicines for blood pressure, heart disease, irregular heartbeat  cyclosporine  diuretics  medicines for diabetes, including insulin  quinidine This list may not describe all possible interactions. Give your health care provider a list of all the medicines, herbs, non-prescription drugs, or dietary supplements you use. Also tell them if you smoke, drink alcohol, or use illegal drugs. Some items may interact with your medicine. What should I watch for while using this medicine? Visit your doctor or health care professional for regular checks on your progress. To help reduce irritation at the injection site, use a different site for each injection and make sure the solution is at room temperature before use. This medicine may cause decreases in blood sugar. Signs of low blood sugar include chills, cool, pale skin or cold sweats, drowsiness, extreme hunger, fast heartbeat, headache, nausea, nervousness or anxiety, shakiness, trembling, unsteadiness, tiredness, or weakness. Contact your doctor or health care professional right away if you experience any of these symptoms. This medicine may increase blood sugar. Ask your healthcare provider if changes in diet or medicines are needed if you have diabetes. This medicine may cause a decrease in vitamin B12. You should make sure that you get enough vitamin B12 while you are taking this medicine. Discuss the foods you eat and the vitamins you take with your health care professional. What side effects may I notice from receiving this medicine? Side   effects that you should report to your doctor or health care professional as soon as possible:  allergic reactions like skin rash, itching or hives, swelling of the face, lips, or tongue  fast, slow, or irregular heartbeat  right upper belly pain  severe stomach pain  signs  and symptoms of high blood sugar such as being more thirsty or hungry or having to urinate more than normal. You may also feel very tired or have blurry vision.  signs and symptoms of low blood sugar such as feeling anxious; confusion; dizziness; increased hunger; unusually weak or tired; increased sweating; shakiness; cold, clammy skin; irritable; headache; blurred vision; fast heartbeat; loss of consciousness  unusually weak or tired Side effects that usually do not require medical attention (report to your doctor or health care professional if they continue or are bothersome):  diarrhea  dizziness  gas  headache  nausea, vomiting  pain, redness, or irritation at site where injected  upset stomach This list may not describe all possible side effects. Call your doctor for medical advice about side effects. You may report side effects to FDA at 1-800-FDA-1088. Where should I keep my medicine? Keep out of the reach of children. Store in a refrigerator between 2 and 8 degrees C (36 and 46 degrees F). Protect from light. Allow to come to room temperature naturally. Do not use artificial heat. If protected from light, the injection may be stored at room temperature between 20 and 30 degrees C (70 and 86 degrees F) for 14 days. After the initial use, throw away any unused portion of a multiple dose vial after 14 days. Throw away unused portions of the ampules after use. NOTE: This sheet is a summary. It may not cover all possible information. If you have questions about this medicine, talk to your doctor, pharmacist, or health care provider.  2021 Elsevier/Gold Standard (2019-01-28 13:33:09)  

## 2020-11-28 ENCOUNTER — Encounter: Payer: Self-pay | Admitting: Hematology

## 2020-12-13 NOTE — Progress Notes (Signed)
[facility]   [phone] Fax:([phone]) [phone]   Clinic Follow up Note   Patient Care Team: [place], [place] as PCP - General  Date of Service:  12/15/2020  CHIEF COMPLAINT: F/u of Carcinoid tumor  SUMMARY OF ONCOLOGIC HISTORY: Oncology History Overview Note  Cancer Staging No matching staging information was found for the patient.    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    08/17/2005 Surgery   Carcinoid tumor of the ileum following several years of paroxysmal abdominal pain. The patient underwent surgical resection of her carcinoid tumor on 08/17/2005. Two out of 13 lymph nodes were positive. The patient's stage was III. The patient was noted to have an elevated urinary 5HIAA level in July 2011.   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 Sandostatin LAR increased to 60 mg IM every 4 weeks on 08/27/2017 due to disease progression. Held from 12/01/2017 per pt's request, restarted at 42m IM every 4 weeks on 05/11/2018    10/19/2012 Imaging   CT scan of the abdomen and pelvis with IV contrast on 10/19/2012 showed generally stable disease with the exception of a decrease in the right abdominal mesenteric lymphadenopathy. There have been 2 lesions in the liver which are felt to be stable. One lesion is in the lateral segment of the left hepatic lobe measuring 14 x 14 mm. The second lesion is seen in the lateral segment adjacent to the falciform ligament and measures 9 mm.     08/20/2017 PET scan   [facility] PET  IMPRESSION: 1. Intense radiotracer activity associated with multiple hyperdense liver lesions consistent with well differentiated neuroendocrine tumor hepatic metastasis. 2. Multiple  small foci peritoneal metastasis within the lower abdomen and pelvis. 3. Large mass associated the RIGHT adnexa and small lesions in LEFT adnexa also with intense radiotracer activity consistent with neuroendocrine tumor metastasis to the adnexa. 4. No evidence of pulmonary metastasis or skeletal metastasis.   08/24/2018 Imaging   CT AP W Contrast 08/24/18  IMPRESSION: 1. Multiple metastatic lesions are unchanged from the most recent prior CT. There are multiple hypervascular liver lesions as well as omental and peritoneal masses, which show no increase in size or number from the prior CT. No evidence of new metastatic disease. 2. No acute findings. 3. Hepatic steatosis.     Imaging   A positive octreotide scan was noted coming from the right lower pelvis adjacent to the bladder on 09/06/2010. Initially this was felt to be a uterine fibroid. The patient has continued to have positive octreotide scan on 11/07/2011 and 05/06/2012. CT scans of the abdomen and pelvis were also carried out on 03/02/2010, 02/24/2012 and 06/30/2012. These scans in conjunction with an elevated 5HIAA level of 22.7 on 04/13/2012 and a chromogranin A level which had risen to 18.0 on 03/23/2012 strongly suggested that the patient's disease had recurred.    08/24/2018 Imaging   CT AP W Contrast 08/24/18  IMPRESSION: 1. Multiple metastatic lesions are unchanged from the most recent prior CT. There are multiple hypervascular liver lesions as well as omental and peritoneal masses, which show no increase in size or number from the prior CT. No evidence of new metastatic disease. 2. No acute findings. 3. Hepatic  steatosis.   01/29/2019 Imaging   CT AP W Contrast 01/29/19 IMPRESSION: 1. Essentially stable metastatic carcinoid tumor/neuroendocrine tumor. 2. Multiple round enhancing hepatic lesions unchanged. 3. Bulky peritoneal mass in the posterior cul-de-sac stable. 4. Stable small peritoneal and mesenteric  metastasis. 5. No bowel obstruction. Serosal implant along the anti mesenteric border of the cecum unchanged.   07/12/2019 Imaging   CT AP W Contrast  IMPRESSION: 1. Essentially stable appearance of the scattered hepatic and peritoneal metastatic lesions. 2. Right nephrolithiasis. 3. Aortic atherosclerosis. 4. Stable appearance of larger tumor implants along the adnexa and along the fundal and posterior margins of the uterus. 5. Mild lower lumbar spondylosis and degenerative disc disease.   Aortic Atherosclerosis (ICD10-I70.0).   02/25/2020 Imaging   CT CAP w contrast IMPRESSION: 1. Overall, imaging findings are stable to minimally progressed in the interval. 2. Multiple hypervascular liver metastases are similar to minimally progressed since 07/12/2019. These are best visualized on arterial phase imaging. No new liver lesion on today's exam. 3. Stable hepatoduodenal ligament lymph nodes and pelvic metastases are stable. 4. Omental/mesenteric nodules measure minimally larger today. 5. Stable 4 mm posterior right lower lobe pulmonary nodule. Continued attention on follow-up recommended. 6. Aortic Atherosclerosis (ICD10-I70.0).     09/04/2020 Imaging   CT CAP  IMPRESSION: 1. Hypervascular liver metastases are stable to mildly increased as detailed. 2. Small peritoneal metastases scattered in the omentum, deep pelvis and along the liver capsule, stable to minimally increased as detailed. 3. Stable mild central mesenteric adenopathy. 4. Tiny right lower lobe pulmonary nodule is stable. No new or progressive findings in the chest. 5. Chronic findings include: Aortic Atherosclerosis (ICD10-I70.0). Cholelithiasis. Nonobstructing right nephrolithiasis. Mild left colonic diverticulosis.      CURRENT THERAPY:  Sandostatin LAR 30 mg IM every 4 weeks, started on 07/07/2012. Sandostatin LAR increased to 60 mg IM every 4 weeks on 08/27/2017 due to disease progression. Held from  12/01/2017 per pt's request, restarted at 55m IM every 4 weeks on 05/11/2018  INTERVAL HISTORY:  Vanessa Waller is here for a follow up of carcinoid tumor. She was last seen by me 3 months ago. She presents to the clinic alone. She notes she has retired and is now with [employer] cover her insurance. I reviewed her medication list with her. She notes she still has mild diarrhea. She also has flushing. She notes she is taking 10000 units of Vit D daily. She would like to continue to monitor her level.    REVIEW OF SYSTEMS:   Constitutional: Denies fevers, chills or abnormal weight loss Eyes: Denies blurriness of vision Ears, nose, mouth, throat, and face: Denies mucositis or sore throat Respiratory: Denies cough, dyspnea or wheezes Cardiovascular: Denies palpitation, chest discomfort or lower extremity swelling Gastrointestinal:  Denies nausea, heartburn or change in bowel habits Skin: Denies abnormal skin rashes Lymphatics: Denies new lymphadenopathy or easy bruising Neurological:Denies numbness, tingling or new weaknesses Behavioral/Psych: Mood is stable, no new changes  All other systems were reviewed with the patient and are negative.  MEDICAL HISTORY:  Past Medical History:  Diagnosis Date  . 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 carcinoid tumor dx'd 08/2005   small interstine  . Diverticulosis   . Hepatic steatosis 02/24/12  . History of kidney stones    x 2   . Hypopotassemia   . 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 ([facility]    liver,mesentery,pelvis  . Nonspecific elevation of levels of transaminase or lactic acid dehydrogenase (LDH)   .  Small bowel obstruction ([place]) 2007  . Uterine fibroid     SURGICAL HISTORY: Past Surgical History:  Procedure Laterality Date  . carcinoid tumor resection  2007   ileum  . colonscopy  not sure  . CYSTOSCOPY WITH URETEROSCOPY, STONE BASKETRY AND STENT PLACEMENT Right 10/14/2019   Procedure: CYSTOSCOPY WITH RIGHT  URETEROSCOPY, LASER LITHOTRIPSY STONE BASKETRY AND RIGHT  STENT PLACEMENT;  Surgeon: Ardis Hughs, MD;  [facility];  Service: Urology;  Laterality: Right;  . CYSTOSCOPY/URETEROSCOPY/HOLMIUM LASER/STENT PLACEMENT Right 01/16/2018   Procedure: RIGHT URETEROSCOPY/RIGHT RETROGRADE/HOLMIUM LASER/STENT PLACEMENT;  Surgeon: Ardis Hughs, MD;  Location[facility];  Service: Urology;  Laterality: Right;  . TUBAL LIGATION      I have reviewed the social history and family history with the patient and they are unchanged from previous note.  ALLERGIES:  is allergic to cephalexin and amoxicillin.  MEDICATIONS:  Current Outpatient Medications  Medication Sig Dispense Refill  . Ascorbic Acid (VITAMIN C) 500 MG CAPS Take 1 capsule by mouth daily.    . Biotin 10 MG CAPS Take by mouth.    . calcium acetate (PHOSLO) 667 MG capsule Take by mouth every evening.     . Cholecalciferol (VITAMIN D-3) 5000 UNITS TABS Take 1 tablet by mouth every evening.     . cyanocobalamin 1000 MCG tablet Take 1,000 mcg by mouth daily.    [name] loperamide (IMODIUM) 2 MG capsule Take by mouth as needed for diarrhea or loose stools.    . Multiple Vitamin (MULTIVITAMIN) tablet Take 1 tablet by mouth every evening.     [name] octreotide (SANDOSTATIN LAR) 30 MG injection Inject 30 mg into the muscle every 28 (twenty-eight) days.    . potassium chloride SA (KLOR-CON) 20 MEQ tablet Take 1 tablet (20 mEq total) by mouth every other day. 45 tablet 3  . pyridOXINE (VITAMIN B-6) 100 MG tablet Take 100 mg by mouth daily.     No current facility-administered medications for this visit.    PHYSICAL EXAMINATION: ECOG PERFORMANCE STATUS: 0 - Asymptomatic  Vitals:   12/15/20 1529  BP: (!) 154/86  Pulse: 77  Resp: 13  Temp: 98.8 F (37.1 C)  SpO2: 99%   Filed Weights   12/15/20 1529  Weight: 134 lb 8 oz (61 kg)   Due to COVID19 we will limit examination to appearance. Patient had no  complaints.  GENERAL:alert, no distress and comfortable SKIN: skin color normal, no rashes or significant lesions EYES: normal, Conjunctiva are pink and non-injected, sclera clear  NEURO: alert & oriented x 3 with fluent speech   LABORATORY DATA:  I have reviewed the data as listed CBC Latest Ref Rng & Units 12/15/2020 08/21/2020 07/17/2020  WBC 4.0 - 10.5 K/uL 5.7 5.7 6.1  Hemoglobin 12.0 - 15.0 g/dL 14.9 13.6 14.3  Hematocrit 36.0 - 46.0 % 46.5(H) 43.7 43.4  Platelets 150 - 400 K/uL 219 252 249     CMP Latest Ref Rng & Units 12/15/2020 08/21/2020 07/17/2020  Glucose 70 - 99 mg/dL 105(H) 85 113(H)  BUN 8 - 23 mg/dL '13 18 12  ' Creatinine 0.44 - 1.00 mg/dL 0.99 0.85 0.80  Sodium 135 - 145 mmol/L 139 142 141  Potassium 3.5 - 5.1 mmol/L 4.2 3.6 3.8  Chloride 98 - 111 mmol/L 103 105 108  CO2 22 - 32 mmol/L '27 29 28  ' Calcium 8.9 - 10.3 mg/dL 9.5 8.8(L) 9.1  Total Protein 6.5 - 8.1 g/dL 7.5 7.4 7.4  Total Bilirubin 0.3 - 1.2 mg/dL 0.5 0.4 0.7  Alkaline Phos 38 - 126 U/L 65 59 58  AST 15 - 41 U/L 55(H) 36 38  ALT 0 - 44 U/L 41 35 40      RADIOGRAPHIC STUDIES: I have personally reviewed the radiological images as listed and agreed with the findings in the report. No results found.   ASSESSMENT & PLAN:  Vanessa Waller is a 65 y.o. female with    1. 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 She was treated with surgical resectionin 2/007.  -Sheunfortunatelyhas cancer recurrencewith metastasisto the liver, peritoneumand adnexa in late 2013. She has been on Sandostatin injections since 07/07/12. Her diarrhea very mildand she has been toleratingwell.  -Due to mild disease progression in earlier 2019, I increased her dose of Sandostatin injectiondose to 63m, she came offSandostatin injectionafter 4/15/2019per her request, and restart 326mdose in 04/2018, she has been tolerating  well. -Her CT CAP from 09/04/20 and compared to her previous CT from 2020, showed slightly increased liver metastases, tiny right lower lung nodule and stable peritoneal metastases. Her largest liver lesion measure 2.6cm now (it was 1.7cm in 2020). -I discussed although this progression is slow, it is now significant growth since 2 years ago. I recommended switching to LuAptosreatment to control her disease for longer, but her insurance has not approved this due to lack of Ki67 data on previous biopsy. I plan to appeal this. In the meantime, I recommend she consult with Dr EdLeonia ReevesShe would like to wait until treatment is approved before she proceed with consult.  -For now, she can continue Sandostatin. Labs reviewed, BG 105, AST 55. Overall adequate to proceed with Sandostatin injection today and continue every 4 weeks.  -F/u in 8 weeks with restaging scan.  Due to severe national CT IV contrast shortage, I will obtain DOTATATE PET scan before next visit   2. Hypokalemia -Continue oral potassium every 3 days. K normal recently, will continue oral potassium.   3. HTN -Follow-up with primary care physician. Not on medications   4. Cancer screening  -She is overdue for mammogram, last mammogram in July 2015. -She will continue follow up with her PCP and other specialists.   PLAN  -Proceed with Sandostatin injection today -Sandostatin injection every 4 and 8 weeks  -F/u in 8 weeks with lab and DOTATATE PET scan a few days before.  -Check Vit D level with next labs.  -We will appeal her Lutathera treatment with her insurance company   No problem-specific Assessment & Plan notes found for this encounter.   Orders Placed This Encounter  Procedures  . NM PET (NETSPOT GA 6892OTATATE) SKULL BASE TO MID THIGH    Standing Status:   Future    Standing Expiration Date:   12/15/2021    Order Specific Question:   If indicated for the ordered procedure, I authorize the administration of a  radiopharmaceutical per Radiology protocol    Answer:   Yes    Order Specific Question:   Preferred imaging location?    Answer:   [facility]. Vitamin D 25 hydroxy    Standing Status:   Future    Standing Expiration Date:   12/15/2021   All questions were answered. The patient knows to call the clinic with any problems, questions or concerns. No barriers to learning was detected. The total time spent in the appointment  was 30 minutes.     Truitt Merle, MD 12/15/2020   I, Joslyn Devon, am acting as scribe for Truitt Merle, MD.   I have reviewed the above documentation for accuracy and completeness, and I agree with the above.

## 2020-12-14 ENCOUNTER — Encounter: Payer: Self-pay | Admitting: Oncology

## 2020-12-15 ENCOUNTER — Inpatient Hospital Stay (HOSPITAL_BASED_OUTPATIENT_CLINIC_OR_DEPARTMENT_OTHER): Payer: BC Managed Care – PPO | Admitting: Hematology

## 2020-12-15 ENCOUNTER — Encounter: Payer: Self-pay | Admitting: Hematology

## 2020-12-15 ENCOUNTER — Inpatient Hospital Stay: Payer: BC Managed Care – PPO

## 2020-12-15 ENCOUNTER — Other Ambulatory Visit: Payer: Self-pay

## 2020-12-15 ENCOUNTER — Inpatient Hospital Stay: Payer: BC Managed Care – PPO | Attending: Hematology

## 2020-12-15 VITALS — BP 154/86 | HR 77 | Temp 98.8°F | Resp 13 | Ht 61.0 in | Wt 134.5 lb

## 2020-12-15 DIAGNOSIS — M47816 Spondylosis without myelopathy or radiculopathy, lumbar region: Secondary | ICD-10-CM | POA: Insufficient documentation

## 2020-12-15 DIAGNOSIS — N200 Calculus of kidney: Secondary | ICD-10-CM | POA: Insufficient documentation

## 2020-12-15 DIAGNOSIS — C7B09 Secondary carcinoid tumors of other sites: Secondary | ICD-10-CM | POA: Diagnosis not present

## 2020-12-15 DIAGNOSIS — K760 Fatty (change of) liver, not elsewhere classified: Secondary | ICD-10-CM | POA: Insufficient documentation

## 2020-12-15 DIAGNOSIS — E876 Hypokalemia: Secondary | ICD-10-CM | POA: Diagnosis not present

## 2020-12-15 DIAGNOSIS — C786 Secondary malignant neoplasm of retroperitoneum and peritoneum: Secondary | ICD-10-CM | POA: Diagnosis not present

## 2020-12-15 DIAGNOSIS — Z79899 Other long term (current) drug therapy: Secondary | ICD-10-CM | POA: Insufficient documentation

## 2020-12-15 DIAGNOSIS — I10 Essential (primary) hypertension: Secondary | ICD-10-CM | POA: Diagnosis not present

## 2020-12-15 DIAGNOSIS — R911 Solitary pulmonary nodule: Secondary | ICD-10-CM | POA: Insufficient documentation

## 2020-12-15 DIAGNOSIS — C7A012 Malignant carcinoid tumor of the ileum: Secondary | ICD-10-CM | POA: Diagnosis not present

## 2020-12-15 DIAGNOSIS — E559 Vitamin D deficiency, unspecified: Secondary | ICD-10-CM

## 2020-12-15 DIAGNOSIS — M5136 Other intervertebral disc degeneration, lumbar region: Secondary | ICD-10-CM | POA: Insufficient documentation

## 2020-12-15 DIAGNOSIS — I700 Atherosclerosis of aorta: Secondary | ICD-10-CM | POA: Insufficient documentation

## 2020-12-15 LAB — CMP (CANCER CENTER ONLY)
ALT: 41 U/L (ref 0–44)
AST: 55 U/L — ABNORMAL HIGH (ref 15–41)
Albumin: 3.9 g/dL (ref 3.5–5.0)
Alkaline Phosphatase: 65 U/L (ref 38–126)
Anion gap: 9 (ref 5–15)
BUN: 13 mg/dL (ref 8–23)
CO2: 27 mmol/L (ref 22–32)
Calcium: 9.5 mg/dL (ref 8.9–10.3)
Chloride: 103 mmol/L (ref 98–111)
Creatinine: 0.99 mg/dL (ref 0.44–1.00)
GFR, Estimated: >60 mL/min (ref >60)
Glucose, Bld: 105 mg/dL — ABNORMAL HIGH (ref 70–99)
Potassium: 4.2 mmol/L (ref 3.5–5.1)
Sodium: 139 mmol/L (ref 135–145)
Total Bilirubin: 0.5 mg/dL (ref 0.3–1.2)
Total Protein: 7.5 g/dL (ref 6.5–8.1)

## 2020-12-15 LAB — CBC WITH DIFFERENTIAL (CANCER CENTER ONLY)
Abs Immature Granulocytes: 0.02 10*3/uL (ref 0.00–0.07)
Basophils Absolute: 0.1 10*3/uL (ref 0.0–0.1)
Basophils Relative: 1 %
Eosinophils Absolute: 0 10*3/uL (ref 0.0–0.5)
Eosinophils Relative: 1 %
HCT: 46.5 % — ABNORMAL HIGH (ref 36.0–46.0)
Hemoglobin: 14.9 g/dL (ref 12.0–15.0)
Immature Granulocytes: 0 %
Lymphocytes Relative: 26 %
Lymphs Abs: 1.5 10*3/uL (ref 0.7–4.0)
MCH: 29.6 pg (ref 26.0–34.0)
MCHC: 32 g/dL (ref 30.0–36.0)
MCV: 92.3 fL (ref 80.0–100.0)
Monocytes Absolute: 0.6 10*3/uL (ref 0.1–1.0)
Monocytes Relative: 10 %
Neutro Abs: 3.6 10*3/uL (ref 1.7–7.7)
Neutrophils Relative %: 62 %
Platelet Count: 219 10*3/uL (ref 150–400)
RBC: 5.04 MIL/uL (ref 3.87–5.11)
RDW: 12.7 % (ref 11.5–15.5)
WBC Count: 5.7 10*3/uL (ref 4.0–10.5)
nRBC: 0 % (ref 0.0–0.2)

## 2020-12-15 MED ORDER — OCTREOTIDE ACETATE 30 MG IM KIT
PACK | INTRAMUSCULAR | Status: AC
  Filled 2020-12-15: qty 1

## 2020-12-15 MED ORDER — OCTREOTIDE ACETATE 30 MG IM KIT
30.0000 mg | PACK | Freq: Once | INTRAMUSCULAR | Status: AC
  Administered 2020-12-15: 30 mg via INTRAMUSCULAR

## 2020-12-15 NOTE — Patient Instructions (Signed)
[facility]  Discharge Instructions: Thank you for choosing [facility] to provide your oncology and hematology care.   If you have a lab appointment with the [facility], please go directly to the [facility] and check in at the registration area.   Wear comfortable clothing and clothing appropriate for easy access to any Portacath or PICC line.   We strive to give you quality time with your provider. You may need to reschedule your appointment if you arrive late (15 or more minutes).  Arriving late affects you and other patients whose appointments are after yours.  Also, if you miss three or more appointments without notifying the office, you may be dismissed from the clinic at the provider's discretion.      For prescription refill requests, have your pharmacy contact our office and allow 72 hours for refills to be completed.    Today you received the following medication - sandostatin     To help prevent nausea and vomiting after your treatment, we encourage you to take your nausea medication as directed.  BELOW ARE SYMPTOMS THAT SHOULD BE REPORTED IMMEDIATELY: . *FEVER GREATER THAN 100.4 F (38 C) OR HIGHER . *CHILLS OR SWEATING . *NAUSEA AND VOMITING THAT IS NOT CONTROLLED WITH YOUR NAUSEA MEDICATION . *UNUSUAL SHORTNESS OF BREATH . *UNUSUAL BRUISING OR BLEEDING . *URINARY PROBLEMS (pain or burning when urinating, or frequent urination) . *BOWEL PROBLEMS (unusual diarrhea, constipation, pain near the anus) . TENDERNESS IN MOUTH AND THROAT WITH OR WITHOUT PRESENCE OF ULCERS (sore throat, sores in mouth, or a toothache) . UNUSUAL RASH, SWELLING OR PAIN  . UNUSUAL VAGINAL DISCHARGE OR ITCHING   Items with * indicate a potential emergency and should be followed up as soon as possible or go to the Emergency Department if any problems should occur.  Please show the CHEMOTHERAPY ALERT CARD or IMMUNOTHERAPY ALERT CARD at check-in to the  Emergency Department and triage nurse.  Should you have questions after your visit or need to cancel or reschedule your appointment, please contact [facility]  Dept: [phone]  and follow the prompts.  Office hours are 8:00 a.m. to 4:30 p.m. Monday - Friday. Please note that voicemails left after 4:00 p.m. may not be returned until the following business day.  We are closed weekends and major holidays. You have access to a nurse at all times for urgent questions. Please call the main number to the clinic Dept: [phone] and follow the prompts.   For any non-urgent questions, you may also contact your provider using MyChart. We now offer e-Visits for anyone 53 and older to request care online for non-urgent symptoms. For details visit [email or web address].   Also download the MyChart app! Go to the app store, search "MyChart", open the app, select [facility], and log in with your MyChart username and password.  Due to Covid, a mask is required upon entering the hospital/clinic. If you do not have a mask, one will be given to you upon arrival. For doctor visits, patients may have 1 support person aged 55 or older with them. For treatment visits, patients cannot have anyone with them due to current Covid guidelines and our immunocompromised population.   Octreotide injection solution What is this medicine? OCTREOTIDE (ok TREE oh tide) is used to reduce blood levels of growth hormone in patients with a condition called acromegaly. This medicine also reduces flushing and watery diarrhea caused by certain types of cancer.  This medicine may be used for other purposes; ask your health care provider or pharmacist if you have questions. COMMON BRAND NAME(S): Leatha Gilding, Sandostatin What should I tell my health care provider before I take this medicine? They need to know if you have any of these conditions:  diabetes  gallbladder disease  kidney disease  liver  disease  thyroid disease  an unusual or allergic reaction to octreotide, other medicines, foods, dyes, or preservatives  pregnant or trying to get pregnant  breast-feeding How should I use this medicine? This medicine is for injection under the skin or into a vein (only in emergency situations). It is usually given by a health care professional in a hospital or clinic setting. If you get this medicine at home, you will be taught how to prepare and give this medicine. Allow the injection solution to come to room temperature before use. Do not warm it artificially. Use exactly as directed. Take your medicine at regular intervals. Do not take your medicine more often than directed. It is important that you put your used needles and syringes in a special sharps container. Do not put them in a trash can. If you do not have a sharps container, call your pharmacist or healthcare provider to get one. Talk to your pediatrician regarding the use of this medicine in children. Special care may be needed. Overdosage: If you think you have taken too much of this medicine contact a poison control center or emergency room at once. NOTE: This medicine is only for you. Do not share this medicine with others. What if I miss a dose? If you miss a dose, take it as soon as you can. If it is almost time for your next dose, take only that dose. Do not take double or extra doses. What may interact with this medicine?  bromocriptine  certain medicines for blood pressure, heart disease, irregular heartbeat  cyclosporine  diuretics  medicines for diabetes, including insulin  quinidine This list may not describe all possible interactions. Give your health care provider a list of all the medicines, herbs, non-prescription drugs, or dietary supplements you use. Also tell them if you smoke, drink alcohol, or use illegal drugs. Some items may interact with your medicine. What should I watch for while using this  medicine? Visit your doctor or health care professional for regular checks on your progress. To help reduce irritation at the injection site, use a different site for each injection and make sure the solution is at room temperature before use. This medicine may cause decreases in blood sugar. Signs of low blood sugar include chills, cool, pale skin or cold sweats, drowsiness, extreme hunger, fast heartbeat, headache, nausea, nervousness or anxiety, shakiness, trembling, unsteadiness, tiredness, or weakness. Contact your doctor or health care professional right away if you experience any of these symptoms. This medicine may increase blood sugar. Ask your healthcare provider if changes in diet or medicines are needed if you have diabetes. This medicine may cause a decrease in vitamin B12. You should make sure that you get enough vitamin B12 while you are taking this medicine. Discuss the foods you eat and the vitamins you take with your health care professional. What side effects may I notice from receiving this medicine? Side effects that you should report to your doctor or health care professional as soon as possible:  allergic reactions like skin rash, itching or hives, swelling of the face, lips, or tongue  fast, slow, or irregular heartbeat  right upper  belly pain  severe stomach pain  signs and symptoms of high blood sugar such as being more thirsty or hungry or having to urinate more than normal. You may also feel very tired or have blurry vision.  signs and symptoms of low blood sugar such as feeling anxious; confusion; dizziness; increased hunger; unusually weak or tired; increased sweating; shakiness; cold, clammy skin; irritable; headache; blurred vision; fast heartbeat; loss of consciousness  unusually weak or tired Side effects that usually do not require medical attention (report to your doctor or health care professional if they continue or are  bothersome):  diarrhea  dizziness  gas  headache  nausea, vomiting  pain, redness, or irritation at site where injected  upset stomach This list may not describe all possible side effects. Call your doctor for medical advice about side effects. You may report side effects to FDA at 1-800-FDA-1088. Where should I keep my medicine? Keep out of the reach of children. Store in a refrigerator between 2 and 8 degrees C (36 and 46 degrees F). Protect from light. Allow to come to room temperature naturally. Do not use artificial heat. If protected from light, the injection may be stored at room temperature between 20 and 30 degrees C (70 and 86 degrees F) for 14 days. After the initial use, throw away any unused portion of a multiple dose vial after 14 days. Throw away unused portions of the ampules after use. NOTE: This sheet is a summary. It may not cover all possible information. If you have questions about this medicine, talk to your doctor, pharmacist, or health care provider.  2021 Elsevier/Gold Standard (2019-01-28 13:33:09)

## 2020-12-16 ENCOUNTER — Encounter: Payer: Self-pay | Admitting: Oncology

## 2020-12-16 ENCOUNTER — Encounter: Payer: Self-pay | Admitting: Hematology

## 2020-12-18 ENCOUNTER — Telehealth: Payer: Self-pay | Admitting: Hematology

## 2020-12-18 NOTE — Telephone Encounter (Signed)
Scheduled follow-up appointments per 6/3 los. Patient is aware.

## 2020-12-19 LAB — CHROMOGRANIN A: Chromogranin A (ng/mL): 532.3 ng/mL — ABNORMAL HIGH (ref 0.0–101.8)

## 2020-12-20 ENCOUNTER — Encounter: Payer: Self-pay | Admitting: Oncology

## 2021-01-01 ENCOUNTER — Encounter: Payer: Self-pay | Admitting: Oncology

## 2021-01-11 ENCOUNTER — Encounter: Payer: Self-pay | Admitting: Oncology

## 2021-01-12 ENCOUNTER — Inpatient Hospital Stay: Payer: BC Managed Care – PPO | Attending: Hematology

## 2021-01-12 ENCOUNTER — Other Ambulatory Visit: Payer: Self-pay

## 2021-01-12 VITALS — BP 150/85 | HR 81 | Temp 98.6°F | Resp 18

## 2021-01-12 DIAGNOSIS — Z79899 Other long term (current) drug therapy: Secondary | ICD-10-CM | POA: Insufficient documentation

## 2021-01-12 DIAGNOSIS — E876 Hypokalemia: Secondary | ICD-10-CM | POA: Diagnosis not present

## 2021-01-12 DIAGNOSIS — C7B09 Secondary carcinoid tumors of other sites: Secondary | ICD-10-CM

## 2021-01-12 DIAGNOSIS — C7A012 Malignant carcinoid tumor of the ileum: Secondary | ICD-10-CM | POA: Diagnosis not present

## 2021-01-12 DIAGNOSIS — I10 Essential (primary) hypertension: Secondary | ICD-10-CM | POA: Diagnosis not present

## 2021-01-12 DIAGNOSIS — C787 Secondary malignant neoplasm of liver and intrahepatic bile duct: Secondary | ICD-10-CM | POA: Insufficient documentation

## 2021-01-12 DIAGNOSIS — C786 Secondary malignant neoplasm of retroperitoneum and peritoneum: Secondary | ICD-10-CM | POA: Diagnosis not present

## 2021-01-12 DIAGNOSIS — I700 Atherosclerosis of aorta: Secondary | ICD-10-CM | POA: Diagnosis not present

## 2021-01-12 DIAGNOSIS — Z87442 Personal history of urinary calculi: Secondary | ICD-10-CM | POA: Diagnosis not present

## 2021-01-12 DIAGNOSIS — C7951 Secondary malignant neoplasm of bone: Secondary | ICD-10-CM | POA: Insufficient documentation

## 2021-01-12 MED ORDER — OCTREOTIDE ACETATE 30 MG IM KIT
30.0000 mg | PACK | Freq: Once | INTRAMUSCULAR | Status: AC
  Administered 2021-01-12: 30 mg via INTRAMUSCULAR

## 2021-01-12 MED ORDER — OCTREOTIDE ACETATE 30 MG IM KIT
PACK | INTRAMUSCULAR | Status: AC
  Filled 2021-01-12: qty 1

## 2021-01-12 NOTE — Patient Instructions (Signed)
Octreotide injection solution What is this medication? OCTREOTIDE (ok TREE oh tide) is used to reduce blood levels of growth hormone in patients with a condition called acromegaly. This medicine also reduces flushing and watery diarrhea caused by certain types of cancer. This medicine may be used for other purposes; ask your health care provider or pharmacist if you have questions. COMMON BRAND NAME(S): Bynfezia, Sandostatin What should I tell my care team before I take this medication? They need to know if you have any of these conditions: diabetes gallbladder disease kidney disease liver disease thyroid disease an unusual or allergic reaction to octreotide, other medicines, foods, dyes, or preservatives pregnant or trying to get pregnant breast-feeding How should I use this medication? This medicine is for injection under the skin or into a vein (only in emergency situations). It is usually given by a health care professional in a hospital or clinic setting. If you get this medicine at home, you will be taught how to prepare and give this medicine. Allow the injection solution to come to room temperature before use. Do not warm it artificially. Use exactly as directed. Take your medicine at regular intervals. Do not take your medicine more often than directed. It is important that you put your used needles and syringes in a special sharps container. Do not put them in a trash can. If you do not have a sharps container, call your pharmacist or healthcare provider to get one. Talk to your pediatrician regarding the use of this medicine in children. Special care may be needed. Overdosage: If you think you have taken too much of this medicine contact a poison control center or emergency room at once. NOTE: This medicine is only for you. Do not share this medicine with others. What if I miss a dose? If you miss a dose, take it as soon as you can. If it is almost time for your next dose, take only  that dose. Do not take double or extra doses. What may interact with this medication? bromocriptine certain medicines for blood pressure, heart disease, irregular heartbeat cyclosporine diuretics medicines for diabetes, including insulin quinidine This list may not describe all possible interactions. Give your health care provider a list of all the medicines, herbs, non-prescription drugs, or dietary supplements you use. Also tell them if you smoke, drink alcohol, or use illegal drugs. Some items may interact with your medicine. What should I watch for while using this medication? Visit your doctor or health care professional for regular checks on your progress. To help reduce irritation at the injection site, use a different site for each injection and make sure the solution is at room temperature before use. This medicine may cause decreases in blood sugar. Signs of low blood sugar include chills, cool, pale skin or cold sweats, drowsiness, extreme hunger, fast heartbeat, headache, nausea, nervousness or anxiety, shakiness, trembling, unsteadiness, tiredness, or weakness. Contact your doctor or health care professional right away if you experience any of these symptoms. This medicine may increase blood sugar. Ask your healthcare provider if changes in diet or medicines are needed if you have diabetes. This medicine may cause a decrease in vitamin B12. You should make sure that you get enough vitamin B12 while you are taking this medicine. Discuss the foods you eat and the vitamins you take with your health care professional. What side effects may I notice from receiving this medication? Side effects that you should report to your doctor or health care professional as soon as   possible: allergic reactions like skin rash, itching or hives, swelling of the face, lips, or tongue fast, slow, or irregular heartbeat right upper belly pain severe stomach pain signs and symptoms of high blood sugar such  as being more thirsty or hungry or having to urinate more than normal. You may also feel very tired or have blurry vision. signs and symptoms of low blood sugar such as feeling anxious; confusion; dizziness; increased hunger; unusually weak or tired; increased sweating; shakiness; cold, clammy skin; irritable; headache; blurred vision; fast heartbeat; loss of consciousness unusually weak or tired Side effects that usually do not require medical attention (report to your doctor or health care professional if they continue or are bothersome): diarrhea dizziness gas headache nausea, vomiting pain, redness, or irritation at site where injected upset stomach This list may not describe all possible side effects. Call your doctor for medical advice about side effects. You may report side effects to FDA at 1-800-FDA-1088. Where should I keep my medication? Keep out of the reach of children. Store in a refrigerator between 2 and 8 degrees C (36 and 46 degrees F). Protect from light. Allow to come to room temperature naturally. Do not use artificial heat. If protected from light, the injection may be stored at room temperature between 20 and 30 degrees C (70 and 86 degrees F) for 14 days. After the initial use, throw away any unused portion of a multiple dose vial after 14 days. Throw away unused portions of the ampules after use. NOTE: This sheet is a summary. It may not cover all possible information. If you have questions about this medicine, talk to your doctor, pharmacist, or health care provider.  2022 Elsevier/Gold Standard (2019-01-28 13:33:09)  

## 2021-01-18 ENCOUNTER — Encounter: Payer: Self-pay | Admitting: Oncology

## 2021-01-25 ENCOUNTER — Encounter: Payer: Self-pay | Admitting: Hematology

## 2021-01-29 ENCOUNTER — Other Ambulatory Visit: Payer: Self-pay | Admitting: *Deleted

## 2021-01-29 DIAGNOSIS — C7B09 Secondary carcinoid tumors of other sites: Secondary | ICD-10-CM

## 2021-01-29 DIAGNOSIS — C786 Secondary malignant neoplasm of retroperitoneum and peritoneum: Secondary | ICD-10-CM | POA: Diagnosis not present

## 2021-01-29 DIAGNOSIS — Z87442 Personal history of urinary calculi: Secondary | ICD-10-CM | POA: Diagnosis not present

## 2021-01-29 DIAGNOSIS — C787 Secondary malignant neoplasm of liver and intrahepatic bile duct: Secondary | ICD-10-CM | POA: Diagnosis not present

## 2021-01-29 DIAGNOSIS — C7A012 Malignant carcinoid tumor of the ileum: Secondary | ICD-10-CM | POA: Diagnosis not present

## 2021-01-29 DIAGNOSIS — Z79899 Other long term (current) drug therapy: Secondary | ICD-10-CM | POA: Diagnosis not present

## 2021-01-29 DIAGNOSIS — I10 Essential (primary) hypertension: Secondary | ICD-10-CM | POA: Diagnosis not present

## 2021-01-29 DIAGNOSIS — E876 Hypokalemia: Secondary | ICD-10-CM | POA: Diagnosis not present

## 2021-01-29 DIAGNOSIS — C7951 Secondary malignant neoplasm of bone: Secondary | ICD-10-CM | POA: Diagnosis not present

## 2021-01-29 DIAGNOSIS — I700 Atherosclerosis of aorta: Secondary | ICD-10-CM | POA: Diagnosis not present

## 2021-02-07 ENCOUNTER — Ambulatory Visit (HOSPITAL_COMMUNITY)
Admission: RE | Admit: 2021-02-07 | Discharge: 2021-02-07 | Disposition: A | Discharge status: Home / Self Care | Payer: BC Managed Care – PPO | Source: Ambulatory Visit | Attending: Hematology | Admitting: Hematology

## 2021-02-07 ENCOUNTER — Inpatient Hospital Stay: Payer: BC Managed Care – PPO

## 2021-02-07 ENCOUNTER — Other Ambulatory Visit: Payer: Self-pay

## 2021-02-07 ENCOUNTER — Encounter: Payer: Self-pay | Admitting: Hematology

## 2021-02-07 DIAGNOSIS — E876 Hypokalemia: Secondary | ICD-10-CM | POA: Diagnosis not present

## 2021-02-07 DIAGNOSIS — Z87442 Personal history of urinary calculi: Secondary | ICD-10-CM | POA: Diagnosis not present

## 2021-02-07 DIAGNOSIS — E559 Vitamin D deficiency, unspecified: Secondary | ICD-10-CM

## 2021-02-07 DIAGNOSIS — C7B09 Secondary carcinoid tumors of other sites: Secondary | ICD-10-CM

## 2021-02-07 DIAGNOSIS — C786 Secondary malignant neoplasm of retroperitoneum and peritoneum: Secondary | ICD-10-CM | POA: Insufficient documentation

## 2021-02-07 DIAGNOSIS — E538 Deficiency of other specified B group vitamins: Secondary | ICD-10-CM | POA: Diagnosis not present

## 2021-02-07 DIAGNOSIS — C787 Secondary malignant neoplasm of liver and intrahepatic bile duct: Secondary | ICD-10-CM | POA: Insufficient documentation

## 2021-02-07 DIAGNOSIS — C7A012 Malignant carcinoid tumor of the ileum: Secondary | ICD-10-CM | POA: Diagnosis not present

## 2021-02-07 DIAGNOSIS — Z79899 Other long term (current) drug therapy: Secondary | ICD-10-CM | POA: Diagnosis not present

## 2021-02-07 DIAGNOSIS — C7951 Secondary malignant neoplasm of bone: Secondary | ICD-10-CM | POA: Diagnosis not present

## 2021-02-07 DIAGNOSIS — I10 Essential (primary) hypertension: Secondary | ICD-10-CM | POA: Diagnosis not present

## 2021-02-07 DIAGNOSIS — I700 Atherosclerosis of aorta: Secondary | ICD-10-CM | POA: Diagnosis not present

## 2021-02-07 DIAGNOSIS — C7A1 Malignant poorly differentiated neuroendocrine tumors: Secondary | ICD-10-CM | POA: Diagnosis not present

## 2021-02-07 DIAGNOSIS — K7689 Other specified diseases of liver: Secondary | ICD-10-CM | POA: Diagnosis not present

## 2021-02-07 DIAGNOSIS — Z86012 Personal history of benign carcinoid tumor: Secondary | ICD-10-CM | POA: Diagnosis not present

## 2021-02-07 LAB — CBC WITH DIFFERENTIAL/PLATELET
Abs Immature Granulocytes: 0.01 10*3/uL (ref 0.00–0.07)
Basophils Absolute: 0.1 10*3/uL (ref 0.0–0.1)
Basophils Relative: 1 %
Eosinophils Absolute: 0 10*3/uL (ref 0.0–0.5)
Eosinophils Relative: 1 %
HCT: 46.8 % — ABNORMAL HIGH (ref 36.0–46.0)
Hemoglobin: 15.3 g/dL — ABNORMAL HIGH (ref 12.0–15.0)
Immature Granulocytes: 0 %
Lymphocytes Relative: 34 %
Lymphs Abs: 1.7 10*3/uL (ref 0.7–4.0)
MCH: 29.9 pg (ref 26.0–34.0)
MCHC: 32.7 g/dL (ref 30.0–36.0)
MCV: 91.6 fL (ref 80.0–100.0)
Monocytes Absolute: 0.4 10*3/uL (ref 0.1–1.0)
Monocytes Relative: 7 %
Neutro Abs: 2.9 10*3/uL (ref 1.7–7.7)
Neutrophils Relative %: 57 %
Platelets: 243 10*3/uL (ref 150–400)
RBC: 5.11 MIL/uL (ref 3.87–5.11)
RDW: 12.8 % (ref 11.5–15.5)
WBC: 5.1 10*3/uL (ref 4.0–10.5)
nRBC: 0 % (ref 0.0–0.2)

## 2021-02-07 LAB — COMPREHENSIVE METABOLIC PANEL
ALT: 48 U/L — ABNORMAL HIGH (ref 0–44)
AST: 55 U/L — ABNORMAL HIGH (ref 15–41)
Albumin: 4.2 g/dL (ref 3.5–5.0)
Alkaline Phosphatase: 60 U/L (ref 38–126)
Anion gap: 9 (ref 5–15)
BUN: 12 mg/dL (ref 8–23)
CO2: 25 mmol/L (ref 22–32)
Calcium: 9.7 mg/dL (ref 8.9–10.3)
Chloride: 106 mmol/L (ref 98–111)
Creatinine, Ser: 0.96 mg/dL (ref 0.44–1.00)
GFR, Estimated: >60 mL/min (ref >60)
Glucose, Bld: 100 mg/dL — ABNORMAL HIGH (ref 70–99)
Potassium: 4.1 mmol/L (ref 3.5–5.1)
Sodium: 140 mmol/L (ref 135–145)
Total Bilirubin: 0.9 mg/dL (ref 0.3–1.2)
Total Protein: 7.9 g/dL (ref 6.5–8.1)

## 2021-02-07 LAB — 5 HIAA, QUANTITATIVE, URINE, 24 HOUR
5-HIAA, Ur: 57.5 mg/L
5-HIAA,Quant.,24 Hr Urine: 48.9 mg/24 hr — ABNORMAL HIGH (ref 0.0–14.9)
Total Volume: 850

## 2021-02-07 LAB — VITAMIN D 25 HYDROXY (VIT D DEFICIENCY, FRACTURES): Vit D, 25-Hydroxy: 30.55 ng/mL (ref 30–100)

## 2021-02-07 MED ORDER — GALLIUM GA 68 DOTATATE IV KIT
3.5000 | PACK | Freq: Once | INTRAVENOUS | Status: AC | PRN
  Administered 2021-02-07: 3.5 via INTRAVENOUS

## 2021-02-08 ENCOUNTER — Other Ambulatory Visit: Payer: Self-pay | Admitting: Hematology

## 2021-02-08 DIAGNOSIS — E538 Deficiency of other specified B group vitamins: Secondary | ICD-10-CM

## 2021-02-09 ENCOUNTER — Inpatient Hospital Stay: Payer: BC Managed Care – PPO

## 2021-02-09 ENCOUNTER — Other Ambulatory Visit: Payer: Self-pay

## 2021-02-09 ENCOUNTER — Inpatient Hospital Stay (HOSPITAL_BASED_OUTPATIENT_CLINIC_OR_DEPARTMENT_OTHER): Payer: BC Managed Care – PPO | Admitting: Hematology

## 2021-02-09 VITALS — BP 165/75 | HR 78 | Temp 97.9°F | Resp 17 | Wt 133.7 lb

## 2021-02-09 DIAGNOSIS — I10 Essential (primary) hypertension: Secondary | ICD-10-CM | POA: Diagnosis not present

## 2021-02-09 DIAGNOSIS — I700 Atherosclerosis of aorta: Secondary | ICD-10-CM | POA: Diagnosis not present

## 2021-02-09 DIAGNOSIS — C7A012 Malignant carcinoid tumor of the ileum: Secondary | ICD-10-CM

## 2021-02-09 DIAGNOSIS — C7B09 Secondary carcinoid tumors of other sites: Secondary | ICD-10-CM | POA: Diagnosis not present

## 2021-02-09 DIAGNOSIS — E538 Deficiency of other specified B group vitamins: Secondary | ICD-10-CM

## 2021-02-09 DIAGNOSIS — C7951 Secondary malignant neoplasm of bone: Secondary | ICD-10-CM | POA: Diagnosis not present

## 2021-02-09 DIAGNOSIS — C787 Secondary malignant neoplasm of liver and intrahepatic bile duct: Secondary | ICD-10-CM | POA: Diagnosis not present

## 2021-02-09 DIAGNOSIS — E876 Hypokalemia: Secondary | ICD-10-CM | POA: Diagnosis not present

## 2021-02-09 DIAGNOSIS — Z87442 Personal history of urinary calculi: Secondary | ICD-10-CM | POA: Diagnosis not present

## 2021-02-09 DIAGNOSIS — Z79899 Other long term (current) drug therapy: Secondary | ICD-10-CM | POA: Diagnosis not present

## 2021-02-09 DIAGNOSIS — C786 Secondary malignant neoplasm of retroperitoneum and peritoneum: Secondary | ICD-10-CM | POA: Diagnosis not present

## 2021-02-09 LAB — VITAMIN B12: Vitamin B-12: 1421 pg/mL — ABNORMAL HIGH (ref 180–914)

## 2021-02-09 MED ORDER — OCTREOTIDE ACETATE 30 MG IM KIT
PACK | INTRAMUSCULAR | Status: AC
  Filled 2021-02-09: qty 1

## 2021-02-09 MED ORDER — OCTREOTIDE ACETATE 30 MG IM KIT
30.0000 mg | PACK | Freq: Once | INTRAMUSCULAR | Status: AC
  Administered 2021-02-09: 30 mg via INTRAMUSCULAR

## 2021-02-09 NOTE — Patient Instructions (Signed)
Octreotide injection solution What is this medication? OCTREOTIDE (ok TREE oh tide) is used to reduce blood levels of growth hormone in patients with a condition called acromegaly. This medicine also reduces flushing and watery diarrhea caused by certain types of cancer. This medicine may be used for other purposes; ask your health care provider or pharmacist if you have questions. COMMON BRAND NAME(S): Bynfezia, Sandostatin What should I tell my care team before I take this medication? They need to know if you have any of these conditions: diabetes gallbladder disease kidney disease liver disease thyroid disease an unusual or allergic reaction to octreotide, other medicines, foods, dyes, or preservatives pregnant or trying to get pregnant breast-feeding How should I use this medication? This medicine is for injection under the skin or into a vein (only in emergency situations). It is usually given by a health care professional in a hospital or clinic setting. If you get this medicine at home, you will be taught how to prepare and give this medicine. Allow the injection solution to come to room temperature before use. Do not warm it artificially. Use exactly as directed. Take your medicine at regular intervals. Do not take your medicine more often than directed. It is important that you put your used needles and syringes in a special sharps container. Do not put them in a trash can. If you do not have a sharps container, call your pharmacist or healthcare provider to get one. Talk to your pediatrician regarding the use of this medicine in children. Special care may be needed. Overdosage: If you think you have taken too much of this medicine contact a poison control center or emergency room at once. NOTE: This medicine is only for you. Do not share this medicine with others. What if I miss a dose? If you miss a dose, take it as soon as you can. If it is almost time for your next dose, take only  that dose. Do not take double or extra doses. What may interact with this medication? bromocriptine certain medicines for blood pressure, heart disease, irregular heartbeat cyclosporine diuretics medicines for diabetes, including insulin quinidine This list may not describe all possible interactions. Give your health care provider a list of all the medicines, herbs, non-prescription drugs, or dietary supplements you use. Also tell them if you smoke, drink alcohol, or use illegal drugs. Some items may interact with your medicine. What should I watch for while using this medication? Visit your doctor or health care professional for regular checks on your progress. To help reduce irritation at the injection site, use a different site for each injection and make sure the solution is at room temperature before use. This medicine may cause decreases in blood sugar. Signs of low blood sugar include chills, cool, pale skin or cold sweats, drowsiness, extreme hunger, fast heartbeat, headache, nausea, nervousness or anxiety, shakiness, trembling, unsteadiness, tiredness, or weakness. Contact your doctor or health care professional right away if you experience any of these symptoms. This medicine may increase blood sugar. Ask your healthcare provider if changes in diet or medicines are needed if you have diabetes. This medicine may cause a decrease in vitamin B12. You should make sure that you get enough vitamin B12 while you are taking this medicine. Discuss the foods you eat and the vitamins you take with your health care professional. What side effects may I notice from receiving this medication? Side effects that you should report to your doctor or health care professional as soon as   possible: allergic reactions like skin rash, itching or hives, swelling of the face, lips, or tongue fast, slow, or irregular heartbeat right upper belly pain severe stomach pain signs and symptoms of high blood sugar such  as being more thirsty or hungry or having to urinate more than normal. You may also feel very tired or have blurry vision. signs and symptoms of low blood sugar such as feeling anxious; confusion; dizziness; increased hunger; unusually weak or tired; increased sweating; shakiness; cold, clammy skin; irritable; headache; blurred vision; fast heartbeat; loss of consciousness unusually weak or tired Side effects that usually do not require medical attention (report to your doctor or health care professional if they continue or are bothersome): diarrhea dizziness gas headache nausea, vomiting pain, redness, or irritation at site where injected upset stomach This list may not describe all possible side effects. Call your doctor for medical advice about side effects. You may report side effects to FDA at 1-800-FDA-1088. Where should I keep my medication? Keep out of the reach of children. Store in a refrigerator between 2 and 8 degrees C (36 and 46 degrees F). Protect from light. Allow to come to room temperature naturally. Do not use artificial heat. If protected from light, the injection may be stored at room temperature between 20 and 30 degrees C (70 and 86 degrees F) for 14 days. After the initial use, throw away any unused portion of a multiple dose vial after 14 days. Throw away unused portions of the ampules after use. NOTE: This sheet is a summary. It may not cover all possible information. If you have questions about this medicine, talk to your doctor, pharmacist, or health care provider.  2022 Elsevier/Gold Standard (2019-01-28 13:33:09)  

## 2021-02-09 NOTE — Progress Notes (Signed)
Vanessa Waller   [phone] Fax:(336) [phone]   Clinic Follow up Note   Patient Care Team: [place], [place] as PCP - General  Date of Service:  02/09/2021  CHIEF COMPLAINT: F/u of Carcinoid tumor   SUMMARY OF ONCOLOGIC HISTORY: Oncology History Overview Note  Cancer Staging No matching staging information was found for the patient.    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    08/17/2005 Surgery   Carcinoid tumor of the ileum following several years of paroxysmal abdominal pain. The patient underwent surgical resection of her carcinoid tumor on 08/17/2005. Two out of 13 lymph nodes were positive. The patient's stage was III. The patient was noted to have an elevated urinary 5HIAA level in July 2011.   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 Sandostatin LAR increased to 60 mg IM every 4 weeks on 08/27/2017 due to disease progression. Held from 12/01/2017 per pt's request, restarted at 34m IM every 4 weeks on 05/11/2018    10/19/2012 Imaging   CT scan of the abdomen and pelvis with IV contrast on 10/19/2012 showed generally stable disease with the exception of a decrease in the right abdominal mesenteric lymphadenopathy. There have been 2 lesions in the liver which are felt to be stable. One lesion is in the lateral segment of the left hepatic lobe measuring 14 x 14 mm. The second lesion is seen in the lateral segment adjacent to the falciform ligament and measures 9 mm.     08/20/2017 PET scan   Donatate PET  IMPRESSION: 1. Intense radiotracer activity associated with multiple hyperdense liver lesions consistent with well differentiated neuroendocrine tumor hepatic metastasis. 2. Multiple  small foci peritoneal metastasis within the lower abdomen and pelvis. 3. Large mass associated the RIGHT adnexa and small lesions in LEFT adnexa also with intense radiotracer activity consistent with neuroendocrine tumor metastasis to the adnexa. 4. No evidence of pulmonary metastasis or skeletal metastasis.   08/24/2018 Imaging   CT AP W Contrast 08/24/18  IMPRESSION: 1. Multiple metastatic lesions are unchanged from the most recent prior CT. There are multiple hypervascular liver lesions as well as omental and peritoneal masses, which show no increase in size or number from the prior CT. No evidence of new metastatic disease. 2. No acute findings. 3. Hepatic steatosis.     Imaging   A positive octreotide scan was noted coming from the right lower pelvis adjacent to the bladder on 09/06/2010. Initially this was felt to be a uterine fibroid. The patient has continued to have positive octreotide scan on 11/07/2011 and 05/06/2012. CT scans of the abdomen and pelvis were also carried out on 03/02/2010, 02/24/2012 and 06/30/2012. These scans in conjunction with an elevated 5HIAA level of 22.7 on 04/13/2012 and a chromogranin A level which had risen to 18.0 on 03/23/2012 strongly suggested that the patient's disease had recurred.    08/24/2018 Imaging   CT AP W Contrast 08/24/18  IMPRESSION: 1. Multiple metastatic lesions are unchanged from the most recent prior CT. There are multiple hypervascular liver lesions as well as omental and peritoneal masses, which show no increase in size or number from the prior CT. No evidence of new metastatic disease. 2. No acute findings. 3.  Hepatic steatosis.   01/29/2019 Imaging   CT AP W Contrast 01/29/19 IMPRESSION: 1. Essentially stable metastatic carcinoid tumor/neuroendocrine tumor. 2. Multiple round enhancing hepatic lesions unchanged. 3. Bulky peritoneal mass in the posterior cul-de-sac stable. 4. Stable small peritoneal and mesenteric  metastasis. 5. No bowel obstruction. Serosal implant along the anti mesenteric border of the cecum unchanged.   07/12/2019 Imaging   CT AP W Contrast  IMPRESSION: 1. Essentially stable appearance of the scattered hepatic and peritoneal metastatic lesions. 2. Right nephrolithiasis. 3. Aortic atherosclerosis. 4. Stable appearance of larger tumor implants along the adnexa and along the fundal and posterior margins of the uterus. 5. Mild lower lumbar spondylosis and degenerative disc disease.   Aortic Atherosclerosis (ICD10-I70.0).   02/25/2020 Imaging   CT CAP w contrast IMPRESSION: 1. Overall, imaging findings are stable to minimally progressed in the interval. 2. Multiple hypervascular liver metastases are similar to minimally progressed since 07/12/2019. These are best visualized on arterial phase imaging. No new liver lesion on today's exam. 3. Stable hepatoduodenal ligament lymph nodes and pelvic metastases are stable. 4. Omental/mesenteric nodules measure minimally larger today. 5. Stable 4 mm posterior right lower lobe pulmonary nodule. Continued attention on follow-up recommended. 6. Aortic Atherosclerosis (ICD10-I70.0).     09/04/2020 Imaging   CT CAP  IMPRESSION: 1. Hypervascular liver metastases are stable to mildly increased as detailed. 2. Small peritoneal metastases scattered in the omentum, deep pelvis and along the liver capsule, stable to minimally increased as detailed. 3. Stable mild central mesenteric adenopathy. 4. Tiny right lower lobe pulmonary nodule is stable. No new or progressive findings in the chest. 5. Chronic findings include: Aortic Atherosclerosis (ICD10-I70.0). Cholelithiasis. Nonobstructing right nephrolithiasis. Mild left colonic diverticulosis.      CURRENT THERAPY:  Sandostatin LAR 30 mg IM every 4 weeks, started on 07/07/2012. Sandostatin LAR increased to 60 mg IM every 4 weeks on 08/27/2017 due to disease progression. Held from  12/01/2017 per pt's request, restarted at 18m IM every 4 weeks on 05/11/2018   INTERVAL HISTORY:  Vanessa Waller is here for a follow up of carcinoid tumor. She was last seen by me on 12/16/20. She presents to the clinic alone.  She expressed frustration with not having any new information about her Lutathera approval since her prior visit. She had hoped to be able to move forward with Lutathera treatments.  All other systems were reviewed with the patient and are negative.  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 ([facility]    liver,mesentery,pelvis   Nonspecific elevation of levels of transaminase or lactic acid dehydrogenase (LDH)    [place] bowel obstruction [place]   Uterine fibroid     SURGICAL HISTORY: Past Surgical History:  Procedure Laterality Date   carcinoid tumor resection  2007   ileum   colonscopy  not sure   CYSTOSCOPY WITH URETEROSCOPY, STONE BASKETRY AND STENT PLACEMENT Right 10/14/2019   Procedure: CYSTOSCOPY WITH RIGHT URETEROSCOPY, LASER LITHOTRIPSY STONE BASKETRY AND RIGHT  STENT PLACEMENT;  Surgeon: HArdis Hughs MD;  [facility]  Service: Urology;  Laterality: Right;   CYSTOSCOPY/URETEROSCOPY/HOLMIUM LASER/STENT PLACEMENT Right 01/16/2018   Procedure: RIGHT URETEROSCOPY/RIGHT RETROGRADE/HOLMIUM LASER/STENT PLACEMENT;  Surgeon: Ardis Hughs, MD;  Location[facility];  Service: Urology;  Laterality: Right;   TUBAL LIGATION      I have reviewed the social history and family history with the patient and they are unchanged from previous note.  ALLERGIES:  is allergic to cephalexin and amoxicillin.  MEDICATIONS:  Current Outpatient Medications   Medication Sig Dispense Refill   Biotin 1 MG CAPS Take by mouth.     calcium carbonate (OS-CAL) 1250 (500 Ca) MG chewable tablet Chew 1 tablet by mouth daily.     Cholecalciferol (VITAMIN D-3) 5000 UNITS TABS Take 2 tablets by mouth every evening.     cyanocobalamin 1000 MCG tablet Take 1,000 mcg by mouth daily.     loperamide (IMODIUM) 2 MG capsule Take by mouth as needed for diarrhea or loose stools.     Multiple Vitamin (MULTIVITAMIN) tablet Take 1 tablet by mouth every evening.      octreotide (SANDOSTATIN LAR) 30 MG injection Inject 30 mg into the muscle every 28 (twenty-eight) days.     potassium chloride SA (KLOR-CON) 20 MEQ tablet Take 1 tablet (20 mEq total) by mouth every other day. (Patient taking differently: Take 20 mEq by mouth as directed. Every 3 days) 45 tablet 3   pyridOXINE (VITAMIN B-6) 100 MG tablet Take 100 mg by mouth daily.     No current facility-administered medications for this visit.    PHYSICAL EXAMINATION: ECOG PERFORMANCE STATUS: 0 - Asymptomatic  Vitals:   02/09/21 0915  BP: (!) 165/75  Pulse: 78  Resp: 17  Temp: 97.9 F (36.6 C)  SpO2: 100%   Filed Weights   02/09/21 0915  Weight: 133 lb 11.2 oz (60.6 kg)   Due to COVID19 we will limit examination to appearance. Patient had no complaints.  GENERAL:alert, no distress and comfortable SKIN: skin color normal, no rashes or significant lesions EYES: normal, Conjunctiva are pink and non-injected, sclera clear  NEURO: alert & oriented x 3 with fluent speech   LABORATORY DATA:  I have reviewed the data as listed CBC Latest Ref Rng & Units 02/07/2021 12/15/2020 08/21/2020  WBC 4.0 - 10.5 K/uL 5.1 5.7 5.7  Hemoglobin 12.0 - 15.0 g/dL 15.3(H) 14.9 13.6  Hematocrit 36.0 - 46.0 % 46.8(H) 46.5(H) 43.7  Platelets 150 - 400 K/uL 243 219 252     CMP Latest Ref Rng & Units 02/07/2021 12/15/2020 08/21/2020  Glucose 70 - 99 mg/dL 100(H) 105(H) 85  BUN 8 - 23 mg/dL _0 Creatinine 0.44 - 1.00 mg/dL 0.96 0.99  0.85  Sodium 135 - 145 mmol/L 140 139 142  Potassium 3.5 - 5.1 mmol/L 4.1 4.2 3.6  Chloride 98 - 111 mmol/L 106 103 105  CO2 22 - 32 mmol/L _1 Calcium 8.9 - 10.3 mg/dL 9.7 9.5 8.8(L)  Total Protein 6.5 - 8.1 g/dL 7.9 7.5 7.4  Total Bilirubin 0.3 - 1.2 mg/dL 0.9 0.5 0.4  Alkaline Phos 38 - 126 U/L 60 65 59  AST 15 - 41 U/L 55(H) 55(H) 36  ALT 0 - 44 U/L 48(H) 41 35      RADIOGRAPHIC STUDIES: I have personally reviewed the radiological images as listed and agreed with the findings in the report. NM PET (NETSPOT GA 31 DOTATATE) SKULL BASE TO MID THIGH  Result Date: 02/08/2021 CLINICAL DATA:  Metastatic neuroendocrine tumor of GI track origin. Locoregional distant metastasis. EXAM: NUCLEAR MEDICINE PET SKULL BASE TO THIGH TECHNIQUE: 3.5 mCi Ga 68  DOTATATE was injected intravenously. Full-ring PET imaging was performed from the skull base to thigh after the radiotracer. CT data was obtained and used for attenuation correction and anatomic localization. COMPARISON:  CT chest, abdomen and pelvis from 09/04/2020. PET-CT 08/20/2017 FINDINGS: NECK No radiotracer activity in neck lymph nodes. Incidental CT findings: None CHEST No radiotracer accumulation within mediastinal or hilar lymph nodes. No suspicious pulmonary nodules on the CT scan. Incidental CT finding:Unchanged 2 mm nodule in the posterior right lower lobe, image 39/8. Technically too small to characterize by PET-CT. Aortic atherosclerosis. No pericardial effusion. ABDOMEN/PELVIS Multiple tracer avid lesions within the liver are stable in number compared with PET-CT from 08/20/2017. Index lesions include: -lesion in the left hepatic lobe measures 2.2 cm with SUV max of 37, image 106/4. On the previous PET-CT this measured 2.0 with SUV max of 40.08. -posterior left hepatic lobe lesion measures 2.6 cm with SUV max of 31, image 102/4. Previously this measured 1.5 cm with SUV max of 46. -segment 5 lesion measures 2.3 cm with SUV max of 31.42,  image 109/4. Previously this measured 1.5 cm with SUV max of 26.3. Index lymph node in the upper abdominal mesentery measures 1.3 cm with SUV max of 43.45, image 130/4. Previously 0.8 cm within SUV max of 38.9. Scattered tracer avid peritoneal lesions are again seen, including: -left lower quadrant ventral peritoneal lesion measures 0.6 cm with SUV max of 7.75, image 148/4. Previously 0.5 cm within SUV max of 18.95. -Tracer avid mass associated with the right adnexa measures 5.95 cm with SUV max of 49.95, image 158/4. Formally this measured 4.3 cm with SUV max of 43. -Additional tracer avid peritoneal lesions within the left pelvis are similar in multiplicity. Physiologic activity noted in the liver, spleen, adrenal glands and kidneys. Incidental CT findings:None SKELETON No focal activity to suggest skeletal metastasis. Incidental CT findings:None IMPRESSION: 1. Mild increase in size of tracer avid liver metastasis. These are unchanged in multiplicity and degree of tracer uptake. 2. Tracer avid peritoneal metastases are also stable to mildly increased in size but remained unchanged in degrees of tracer uptake and multiplicity. 3. Tracer avid central mesenteric lymph node is increased in size but not significantly changed in degree of tracer uptake. Electronically Signed   By: Kerby Moors M.D.   On: 02/08/2021 15:58     ASSESSMENT & PLAN:  KETRINA BOATENG is a 65 y.o. female with    1. 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 She was treated with surgical resection in 2/007.  -She unfortunately has cancer recurrence with metastasis to the liver, peritoneum and adnexa in late 2013. She has been on Sandostatin injections since 07/07/12. Her diarrhea very mild and she has been tolerating well.  -Due to mild disease progression in earlier 2019, I increased her dose of Sandostatin injection dose to 13m, she came  off Sandostatin injection after 10/27/2017 per her request, and restart 330mdose in 04/2018, she has been tolerating well.  -DOTATATE PET on 02/07/21 showed mild disease progression. I personally reviewed her images and discussed with her  -I again discussed although this progression is slow, it is now significant growth since 2 years ago. I recommended [place] to control her disease for longer, but her insurance has not approved this due to lack of Ki67 data on previous surgical sample. They would require a new biopsy sample for approval. I will  try a third appeal and talk to them directly. If they again deny coverage, she is in agreement to proceed with biopsy.  2. Hypokalemia -Continue oral potassium every 3 days.  -resolved with oral potassium   3. HTN -Follow-up with primary care physician. Not on medications    4. Cancer screening  -She is overdue for mammogram, last mammogram in July 2015. -She will continue follow up with her PCP and other specialists.      PLAN  -PET scan reviewed which showed disease progression  -Proceed with Sandostatin injection today  -Sandostatin injection every 4 weeks  -We will appeal her Lutathera treatment with her insurance company, if denies again will proceed with biopsy for tissue and Ki67 testing    No problem-specific Assessment & Plan notes found for this encounter.   No orders of the defined types were placed in this encounter.  All questions were answered. The patient knows to call the clinic with any problems, questions or concerns. No barriers to learning was detected. The total time spent in the appointment was 30 minutes.     Truitt Merle, MD 02/09/2021   I,  Aurea Graff, am acting as scribe for Truitt Merle, MD.   I have reviewed the above documentation for accuracy and completeness, and I agree with the above.

## 2021-02-11 ENCOUNTER — Encounter: Payer: Self-pay | Admitting: Hematology

## 2021-02-11 ENCOUNTER — Encounter: Payer: Self-pay | Admitting: Oncology

## 2021-02-12 ENCOUNTER — Telehealth: Payer: Self-pay | Admitting: Hematology

## 2021-02-12 NOTE — Telephone Encounter (Signed)
Unable to leave message with follow-up appointments per 7/29 los. Mailed calendar.

## 2021-02-18 ENCOUNTER — Encounter: Payer: Self-pay | Admitting: Hematology

## 2021-02-25 ENCOUNTER — Encounter: Payer: Self-pay | Admitting: Hematology

## 2021-02-27 ENCOUNTER — Other Ambulatory Visit: Payer: Self-pay

## 2021-02-27 DIAGNOSIS — C7A012 Malignant carcinoid tumor of the ileum: Secondary | ICD-10-CM

## 2021-02-28 ENCOUNTER — Encounter (HOSPITAL_COMMUNITY): Payer: Self-pay | Admitting: Radiology

## 2021-02-28 NOTE — Progress Notes (Signed)
Vanessa Waller, Vanessa Waller Legal Sex  Female DOB  09-07-1955 SSN  [id number] Address  [address] [place] [address] Phone  [phone] [facility])  [phone] (Work)  [phone] (Mobile) *Preferred*    RE: US BIOPSY (LIVER) Received: Today Greggory Keen, MD  Jillyn Hidden [place] for US liver lesion bx   See PET 02/07/21   TS        Previous Messages   ----- Message -----  From: Garth Bigness D  Sent: 02/28/2021   9:38 AM EDT  To: Ir Procedure Requests  Subject: US BIOPSY (LIVER)                               Procedure:  US BIOPSY (LIVER)   Reason:  Malignant carcinoid tumor of ileum, Carcinoid tumor of ileum   History:  CT, NM in computer   Provider:  Truitt Merle   Provider Contact:  [phone]

## 2021-03-02 ENCOUNTER — Encounter: Payer: Self-pay | Admitting: Hematology

## 2021-03-06 DIAGNOSIS — C787 Secondary malignant neoplasm of liver and intrahepatic bile duct: Secondary | ICD-10-CM | POA: Diagnosis not present

## 2021-03-06 DIAGNOSIS — C7B8 Other secondary neuroendocrine tumors: Secondary | ICD-10-CM | POA: Diagnosis not present

## 2021-03-09 ENCOUNTER — Inpatient Hospital Stay: Payer: BC Managed Care – PPO | Attending: Hematology

## 2021-03-09 ENCOUNTER — Other Ambulatory Visit: Payer: Self-pay

## 2021-03-09 VITALS — BP 152/78 | HR 78 | Temp 98.6°F | Resp 18

## 2021-03-09 DIAGNOSIS — Z79899 Other long term (current) drug therapy: Secondary | ICD-10-CM | POA: Insufficient documentation

## 2021-03-09 DIAGNOSIS — C7A012 Malignant carcinoid tumor of the ileum: Secondary | ICD-10-CM | POA: Diagnosis not present

## 2021-03-09 DIAGNOSIS — C7B09 Secondary carcinoid tumors of other sites: Secondary | ICD-10-CM

## 2021-03-09 MED ORDER — OCTREOTIDE ACETATE 30 MG IM KIT
30.0000 mg | PACK | Freq: Once | INTRAMUSCULAR | Status: AC
  Administered 2021-03-09: 30 mg via INTRAMUSCULAR
  Filled 2021-03-09: qty 1

## 2021-03-09 NOTE — Patient Instructions (Signed)
Octreotide injection solution What is this medication? OCTREOTIDE (ok TREE oh tide) is used to reduce blood levels of growth hormone in patients with a condition called acromegaly. This medicine also reduces flushing and watery diarrhea caused by certain types of cancer. This medicine may be used for other purposes; ask your health care provider or pharmacist if you have questions. COMMON BRAND NAME(S): Bynfezia, Sandostatin What should I tell my care team before I take this medication? They need to know if you have any of these conditions: diabetes gallbladder disease kidney disease liver disease thyroid disease an unusual or allergic reaction to octreotide, other medicines, foods, dyes, or preservatives pregnant or trying to get pregnant breast-feeding How should I use this medication? This medicine is for injection under the skin or into a vein (only in emergency situations). It is usually given by a health care professional in a hospital or clinic setting. If you get this medicine at home, you will be taught how to prepare and give this medicine. Allow the injection solution to come to room temperature before use. Do not warm it artificially. Use exactly as directed. Take your medicine at regular intervals. Do not take your medicine more often than directed. It is important that you put your used needles and syringes in a special sharps container. Do not put them in a trash can. If you do not have a sharps container, call your pharmacist or healthcare provider to get one. Talk to your pediatrician regarding the use of this medicine in children. Special care may be needed. Overdosage: If you think you have taken too much of this medicine contact a poison control center or emergency room at once. NOTE: This medicine is only for you. Do not share this medicine with others. What if I miss a dose? If you miss a dose, take it as soon as you can. If it is almost time for your next dose, take only  that dose. Do not take double or extra doses. What may interact with this medication? bromocriptine certain medicines for blood pressure, heart disease, irregular heartbeat cyclosporine diuretics medicines for diabetes, including insulin quinidine This list may not describe all possible interactions. Give your health care provider a list of all the medicines, herbs, non-prescription drugs, or dietary supplements you use. Also tell them if you smoke, drink alcohol, or use illegal drugs. Some items may interact with your medicine. What should I watch for while using this medication? Visit your doctor or health care professional for regular checks on your progress. To help reduce irritation at the injection site, use a different site for each injection and make sure the solution is at room temperature before use. This medicine may cause decreases in blood sugar. Signs of low blood sugar include chills, cool, pale skin or cold sweats, drowsiness, extreme hunger, fast heartbeat, headache, nausea, nervousness or anxiety, shakiness, trembling, unsteadiness, tiredness, or weakness. Contact your doctor or health care professional right away if you experience any of these symptoms. This medicine may increase blood sugar. Ask your healthcare provider if changes in diet or medicines are needed if you have diabetes. This medicine may cause a decrease in vitamin B12. You should make sure that you get enough vitamin B12 while you are taking this medicine. Discuss the foods you eat and the vitamins you take with your health care professional. What side effects may I notice from receiving this medication? Side effects that you should report to your doctor or health care professional as soon as   possible: allergic reactions like skin rash, itching or hives, swelling of the face, lips, or tongue fast, slow, or irregular heartbeat right upper belly pain severe stomach pain signs and symptoms of high blood sugar such  as being more thirsty or hungry or having to urinate more than normal. You may also feel very tired or have blurry vision. signs and symptoms of low blood sugar such as feeling anxious; confusion; dizziness; increased hunger; unusually weak or tired; increased sweating; shakiness; cold, clammy skin; irritable; headache; blurred vision; fast heartbeat; loss of consciousness unusually weak or tired Side effects that usually do not require medical attention (report to your doctor or health care professional if they continue or are bothersome): diarrhea dizziness gas headache nausea, vomiting pain, redness, or irritation at site where injected upset stomach This list may not describe all possible side effects. Call your doctor for medical advice about side effects. You may report side effects to FDA at 1-800-FDA-1088. Where should I keep my medication? Keep out of the reach of children. Store in a refrigerator between 2 and 8 degrees C (36 and 46 degrees F). Protect from light. Allow to come to room temperature naturally. Do not use artificial heat. If protected from light, the injection may be stored at room temperature between 20 and 30 degrees C (70 and 86 degrees F) for 14 days. After the initial use, throw away any unused portion of a multiple dose vial after 14 days. Throw away unused portions of the ampules after use. NOTE: This sheet is a summary. It may not cover all possible information. If you have questions about this medicine, talk to your doctor, pharmacist, or health care provider.  2022 Elsevier/Gold Standard (2019-01-28 13:33:09)  

## 2021-03-13 ENCOUNTER — Telehealth: Payer: Self-pay | Admitting: Hematology

## 2021-03-13 NOTE — Telephone Encounter (Signed)
Scheduled appt per 8/26 sch msg. Pt aware.  

## 2021-03-14 DIAGNOSIS — C7B8 Other secondary neuroendocrine tumors: Secondary | ICD-10-CM | POA: Diagnosis not present

## 2021-03-22 ENCOUNTER — Ambulatory Visit (HOSPITAL_COMMUNITY): Payer: BC Managed Care – PPO

## 2021-04-06 ENCOUNTER — Ambulatory Visit: Payer: BC Managed Care – PPO | Admitting: Hematology

## 2021-04-06 ENCOUNTER — Ambulatory Visit: Payer: BC Managed Care – PPO

## 2021-04-08 ENCOUNTER — Encounter: Payer: Self-pay | Admitting: Hematology

## 2021-04-09 ENCOUNTER — Telehealth: Payer: Self-pay

## 2021-04-09 ENCOUNTER — Inpatient Hospital Stay: Payer: BC Managed Care – PPO

## 2021-04-09 ENCOUNTER — Encounter: Payer: Self-pay | Admitting: Hematology

## 2021-04-09 ENCOUNTER — Inpatient Hospital Stay: Payer: BC Managed Care – PPO | Admitting: Hematology

## 2021-04-09 NOTE — Telephone Encounter (Signed)
This nurse received a message from authorization representative related to Sandostatin injections.  Stated that patients insurance request that the patient contact member services to see what facilities will be able to administer the injections for her.  Representative stated that she contacted the patient and provided the information to patient to contact [employer] to speak with them about where she can get her Sandostatin injections.  This nurse reached out to patient to verify that she did speak with [employer] and was given three different locations to choose from.  Patient states that she has an appointment with the MD at [facility] for a CT scan in January but she was also told by MD at [facility] that she may want to keep a local oncologist as well.  Patient states that she will follow up with our office after she see him in January.  No further questions or concerns at this time.  Patient does know to call clinic if there are any questions, concerns or problems.

## 2021-04-20 ENCOUNTER — Encounter: Payer: Self-pay | Admitting: Hematology

## 2021-04-26 ENCOUNTER — Encounter: Payer: Self-pay | Admitting: Hematology

## 2021-05-04 DIAGNOSIS — Z23 Encounter for immunization: Secondary | ICD-10-CM | POA: Diagnosis not present

## 2021-05-14 ENCOUNTER — Other Ambulatory Visit: Payer: Self-pay

## 2021-05-14 ENCOUNTER — Emergency Department (HOSPITAL_COMMUNITY)
Admission: EM | Admit: 2021-05-14 | Discharge: 2021-05-14 | Disposition: A | Discharge status: Home / Self Care | Payer: BC Managed Care – PPO | Attending: Emergency Medicine | Admitting: Emergency Medicine

## 2021-05-14 ENCOUNTER — Emergency Department (HOSPITAL_COMMUNITY): Payer: BC Managed Care – PPO

## 2021-05-14 ENCOUNTER — Encounter (HOSPITAL_COMMUNITY): Payer: Self-pay

## 2021-05-14 DIAGNOSIS — N200 Calculus of kidney: Secondary | ICD-10-CM | POA: Diagnosis not present

## 2021-05-14 DIAGNOSIS — K8020 Calculus of gallbladder without cholecystitis without obstruction: Secondary | ICD-10-CM | POA: Diagnosis not present

## 2021-05-14 DIAGNOSIS — C7A098 Malignant carcinoid tumors of other sites: Secondary | ICD-10-CM | POA: Diagnosis not present

## 2021-05-14 DIAGNOSIS — R1084 Generalized abdominal pain: Secondary | ICD-10-CM

## 2021-05-14 DIAGNOSIS — C7A00 Malignant carcinoid tumor of unspecified site: Secondary | ICD-10-CM

## 2021-05-14 DIAGNOSIS — R109 Unspecified abdominal pain: Secondary | ICD-10-CM | POA: Diagnosis not present

## 2021-05-14 DIAGNOSIS — N23 Unspecified renal colic: Secondary | ICD-10-CM | POA: Insufficient documentation

## 2021-05-14 DIAGNOSIS — D3A8 Other benign neuroendocrine tumors: Secondary | ICD-10-CM | POA: Diagnosis not present

## 2021-05-14 DIAGNOSIS — C801 Malignant (primary) neoplasm, unspecified: Secondary | ICD-10-CM | POA: Diagnosis not present

## 2021-05-14 DIAGNOSIS — R319 Hematuria, unspecified: Secondary | ICD-10-CM | POA: Diagnosis not present

## 2021-05-14 LAB — COMPREHENSIVE METABOLIC PANEL
ALT: 32 U/L (ref 0–44)
AST: 43 U/L — ABNORMAL HIGH (ref 15–41)
Albumin: 4.6 g/dL (ref 3.5–5.0)
Alkaline Phosphatase: 58 U/L (ref 38–126)
Anion gap: 10 (ref 5–15)
BUN: 9 mg/dL (ref 8–23)
CO2: 26 mmol/L (ref 22–32)
Calcium: 9.2 mg/dL (ref 8.9–10.3)
Chloride: 100 mmol/L (ref 98–111)
Creatinine, Ser: 0.79 mg/dL (ref 0.44–1.00)
GFR, Estimated: >60 mL/min (ref >60)
Glucose, Bld: 132 mg/dL — ABNORMAL HIGH (ref 70–99)
Potassium: 3.5 mmol/L (ref 3.5–5.1)
Sodium: 136 mmol/L (ref 135–145)
Total Bilirubin: 0.9 mg/dL (ref 0.3–1.2)
Total Protein: 8.1 g/dL (ref 6.5–8.1)

## 2021-05-14 LAB — CBC WITH DIFFERENTIAL/PLATELET
Abs Immature Granulocytes: 0.03 10*3/uL (ref 0.00–0.07)
Basophils Absolute: 0 10*3/uL (ref 0.0–0.1)
Basophils Relative: 0 %
Eosinophils Absolute: 0.3 10*3/uL (ref 0.0–0.5)
Eosinophils Relative: 4 %
HCT: 49.1 % — ABNORMAL HIGH (ref 36.0–46.0)
Hemoglobin: 15.7 g/dL — ABNORMAL HIGH (ref 12.0–15.0)
Immature Granulocytes: 0 %
Lymphocytes Relative: 10 %
Lymphs Abs: 0.7 10*3/uL (ref 0.7–4.0)
MCH: 29.7 pg (ref 26.0–34.0)
MCHC: 32 g/dL (ref 30.0–36.0)
MCV: 92.8 fL (ref 80.0–100.0)
Monocytes Absolute: 0.1 10*3/uL (ref 0.1–1.0)
Monocytes Relative: 1 %
Neutro Abs: 6.4 10*3/uL (ref 1.7–7.7)
Neutrophils Relative %: 85 %
Platelets: 253 10*3/uL (ref 150–400)
RBC: 5.29 MIL/uL — ABNORMAL HIGH (ref 3.87–5.11)
RDW: 12.4 % (ref 11.5–15.5)
WBC: 7.6 10*3/uL (ref 4.0–10.5)
nRBC: 0 % (ref 0.0–0.2)

## 2021-05-14 LAB — URINALYSIS, ROUTINE W REFLEX MICROSCOPIC
Bacteria, UA: NONE SEEN
Bilirubin Urine: NEGATIVE
Glucose, UA: NEGATIVE mg/dL
Ketones, ur: 20 mg/dL — AB
Leukocytes,Ua: NEGATIVE
Nitrite: NEGATIVE
Protein, ur: 30 mg/dL — AB
Specific Gravity, Urine: 1.025 (ref 1.005–1.030)
pH: 5 (ref 5.0–8.0)

## 2021-05-14 LAB — LIPASE, BLOOD: Lipase: 40 U/L (ref 11–51)

## 2021-05-14 MED ORDER — ONDANSETRON 4 MG PO TBDP: 4.0000 mg | ORAL_TABLET | Freq: Three times a day (TID) | ORAL | 0 refills | Status: DC | PRN

## 2021-05-14 MED ORDER — OXYCODONE-ACETAMINOPHEN 5-325 MG PO TABS: 1.0000 | ORAL_TABLET | Freq: Four times a day (QID) | ORAL | 0 refills | Status: DC | PRN

## 2021-05-14 MED ORDER — SODIUM CHLORIDE 0.9 % IV BOLUS: 1000.0000 mL | Freq: Once | INTRAVENOUS | Status: DC

## 2021-05-14 MED ORDER — OXYCODONE-ACETAMINOPHEN 5-325 MG PO TABS
1.0000 | ORAL_TABLET | Freq: Once | ORAL | Status: DC
Start: 2021-05-14 — End: 2021-05-15
  Filled 2021-05-14: qty 1

## 2021-05-14 NOTE — ED Provider Notes (Signed)
Emergency Medicine Provider Triage Evaluation Note  Vanessa Waller , a 65 y.o. female  was evaluated in triage.  Pt complains of abdominal pain.  Started acutely this morning, associated with nausea and diarrhea but no vomiting.  Patient had surgical removal of carcinoid tumors, denies chest pain shortness of breath.  Abdominal pain does radiate to the back but is not well localized..  Review of Systems  Positive: Abdominal pain, nausea, diarrhea, back pain Negative: Fever  Physical Exam  BP (!) 177/85 (BP Location: Left Arm)   Pulse 76   Temp 98.1 F (36.7 C) (Oral)   Resp 18   SpO2 98%  Gen:   Awake looks uncomfortable Resp:  Normal effort  MSK:   Moves extremities without difficulty  Other:  Abdomen is soft and tender diffusely.  No CVA tenderness  Medical Decision Making  Medically screening exam initiated at 4:22 PM.  Appropriate orders placed.  Vanessa Waller was informed that the remainder of the evaluation will be completed by another provider, this initial triage assessment does not replace that evaluation, and the importance of remaining in the ED until their evaluation is complete.  Cute onset of not well localized abdomin abdominal pain work-up.     Sherrill Raring, PA-C 05/14/21 1624    Varney Biles, MD 05/15/21 2032

## 2021-05-14 NOTE — ED Provider Notes (Signed)
[facility] DEPT Provider Note   CSN: [id number] Arrival date & time: 05/14/21  1507     History Chief Complaint  Patient presents with   Abdominal Pain   Back Pain    Vanessa Waller is a 65 y.o. female.  Pt presents to the ED today with abdominal and back pain.  Pt said sx started this am.  She had some n/v/d.  No fevers.  She said the pain was pretty severe earlier, but it has improved now.       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 [facility])    liver,mesentery,pelvis   Nonspecific elevation of levels of transaminase or lactic acid dehydrogenase (LDH)    Small bowel obstruction ([place]) 2007   Uterine fibroid     Patient Active Problem List   Diagnosis Date Noted   Hypersensitivity reaction 01/31/2014   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 ([place]) 11/09/2012   Diarrhea 09/02/2012   Hypokalemia 09/02/2012   Hepatic steatosis 07/01/2012   Left breast mass 07/01/2012   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   TRANSAMINASES, SERUM, ELEVATED 04/20/2009    Past Surgical History:  Procedure Laterality Date   carcinoid tumor resection  2007   ileum   colonscopy  not sure   CYSTOSCOPY WITH URETEROSCOPY, STONE BASKETRY AND STENT PLACEMENT Right 10/14/2019   Procedure: CYSTOSCOPY WITH RIGHT URETEROSCOPY, LASER LITHOTRIPSY STONE BASKETRY AND RIGHT  STENT PLACEMENT;  Surgeon: Ardis Hughs, MD;  Location: [facility];  Service: Urology;  Laterality: Right;   CYSTOSCOPY/URETEROSCOPY/HOLMIUM LASER/STENT PLACEMENT Right 01/16/2018   Procedure: RIGHT URETEROSCOPY/RIGHT RETROGRADE/HOLMIUM LASER/STENT PLACEMENT;  Surgeon: Ardis Hughs, MD;  Location: [facility];  Service: Urology;  Laterality: Right;   TUBAL LIGATION       OB History   No obstetric history on file.     Family History  Problem Relation Age of Onset   Skin cancer Father    Diabetes Mother    Hypertension Mother    Hyperlipidemia Mother    Hypothyroidism Mother    Hypothyroidism Sister    Hyperlipidemia Sister    Hypothyroidism Sister    Breast cancer Paternal Grandmother     Social History   Tobacco Use   Smoking status: Never   Smokeless tobacco: Never  Vaping Use   Vaping Use: Never used  Substance Use Topics   Alcohol use: Not Currently   Drug use: No    Home Medications Prior to Admission medications   Medication Sig Start Date End Date Taking? Authorizing Provider  ondansetron (ZOFRAN ODT) 4 MG disintegrating tablet Take 1 tablet (4 mg total) by mouth every 8 (eight) hours as needed for nausea or vomiting. 05/14/21  Yes Isla Pence, MD  oxyCODONE-acetaminophen (PERCOCET/ROXICET) 5-325 MG tablet Take 1 tablet by mouth every 6 (six) hours as needed for severe pain. 05/14/21  Yes Isla Pence, MD  Biotin 1 MG CAPS Take by mouth.    [provider]  calcium carbonate (OS-CAL) 1250 (500 Ca) MG chewable tablet Chew 1 tablet by mouth daily.    [provider]  Cholecalciferol (VITAMIN D-3) 5000 UNITS TABS Take 2 tablets by mouth every evening.  [provider]  cyanocobalamin 1000 MCG tablet Take 1,000 mcg by mouth daily.    [provider]  loperamide (IMODIUM) 2 MG capsule Take by mouth as needed for diarrhea or loose stools.    [provider]  Multiple Vitamin (MULTIVITAMIN) tablet Take 1 tablet by mouth every evening.     [provider]  octreotide (SANDOSTATIN LAR) 30 MG injection Inject 30 mg into the muscle every 28 (twenty-eight) days.    [provider]  potassium chloride SA (KLOR-CON) 20 MEQ tablet Take 1 tablet (20 mEq total) by mouth every other day. Patient taking  differently: Take 20 mEq by mouth as directed. Every 3 days 09/18/20   Truitt Merle, MD  pyridOXINE (VITAMIN B-6) 100 MG tablet Take 100 mg by mouth daily.    [provider]    Allergies    Cephalexin and Amoxicillin  Review of Systems   Review of Systems  Gastrointestinal:  Positive for abdominal pain, diarrhea, nausea and vomiting.  Musculoskeletal:  Positive for back pain.  All other systems reviewed and are negative.  Physical Exam Updated Vital Signs BP (!) 146/74 (BP Location: Right Arm)   Pulse 80   Temp 98.1 F (36.7 C) (Oral)   Resp 18   SpO2 97%   Physical Exam Vitals and nursing note reviewed.  Constitutional:      Appearance: She is well-developed.  HENT:     Head: Normocephalic and atraumatic.     Mouth/Throat:     Mouth: Mucous membranes are dry.     Pharynx: Oropharynx is clear.  Eyes:     Extraocular Movements: Extraocular movements intact.     Pupils: Pupils are equal, round, and reactive to light.  Cardiovascular:     Rate and Rhythm: Normal rate and regular rhythm.  Abdominal:     General: Abdomen is flat. Bowel sounds are normal.     Palpations: Abdomen is soft.     Tenderness: There is generalized abdominal tenderness.  Skin:    General: Skin is warm.     Capillary Refill: Capillary refill takes less than 2 seconds.  Neurological:     General: No focal deficit present.     Mental Status: She is alert and oriented to person, place, and time.  Psychiatric:        Mood and Affect: Mood normal.        Behavior: Behavior normal.    ED Results / Procedures / Treatments   Labs (all labs ordered are listed, but only abnormal results are displayed) Labs Reviewed  CBC WITH DIFFERENTIAL/PLATELET - Abnormal; Notable for the following components:      Result Value   RBC 5.29 (*)    Hemoglobin 15.7 (*)    HCT 49.1 (*)    All other components within normal limits  COMPREHENSIVE METABOLIC PANEL - Abnormal; Notable for the following components:    Glucose, Bld 132 (*)    AST 43 (*)    All other components within normal limits  URINALYSIS, ROUTINE W REFLEX MICROSCOPIC - Abnormal; Notable for the following components:   Hgb urine dipstick MODERATE (*)    Ketones, ur 20 (*)    Protein, ur 30 (*)    All other components within normal limits  LIPASE, BLOOD    EKG None  Radiology CT Renal Stone Study  Result Date: 05/14/2021 CLINICAL DATA:  Rt flank pain and hematuria EXAM: CT ABDOMEN AND PELVIS WITHOUT CONTRAST TECHNIQUE: Multidetector CT imaging of the abdomen  and pelvis was performed following the standard protocol without IV contrast. COMPARISON:  CT abdomen pelvis 09/04/2020, CT abdomen/pelvis 06/13/2008, ct abdomen/pelvis 02/25/20, pet ct 02/07/21 FINDINGS: Lower chest: No acute abnormality. Hepatobiliary: Redemonstration of subcapsular hyperdensity along the right hepatic lobe (2:21). Poorly visualized known hepatic metastases. There is a 1 cm calcified lesion within the gallbladder lumen. No gallbladder wall thickening or pericholecystic fluid. No biliary dilatation. Pancreas: No focal lesion. Normal pancreatic contour. No surrounding inflammatory changes. No main pancreatic ductal dilatation. Spleen: Normal in size without focal abnormality. Adrenals/Urinary Tract: No adrenal nodule bilaterally. A 1-3 mm calcified stones within the right kidney. No left nephrolithiasis. No hydronephrosis. No definite contour-deforming renal mass. No ureterolithiasis or hydroureter. The urinary bladder is unremarkable. Stomach/Bowel: Bowel resection surgical changes. Surgical changes related to evaluation of the stomach is within normal limits. Scattered colonic diverticula. No evidence of bowel wall thickening or dilatation. The appendix not definitely identified. Vascular/Lymphatic: No abdominal aorta or iliac aneurysm. Mild atherosclerotic plaque of the aorta and its branches. No abdominal, pelvic, or inguinal lymphadenopathy. Reproductive:  Redemonstration of likely a 5.8 x 4.2 cm right adnexal lesion (2:53). Left adnexa is grossly unremarkable. Uterus is unremarkable. Other: Peritoneal nodularity along the anterior abdomen (2:39). Similar appearing 1.3 cm mesentery soft tissue density (2:34). No intraperitoneal free fluid. No intraperitoneal free gas. No organized fluid collection. Musculoskeletal: No abdominal wall hernia or abnormality. No suspicious lytic or blastic osseous lesions. No acute displaced fracture. IMPRESSION: 1. Markedly limited evaluation on this noncontrast study in a patient with known metastatic neuroendocrine tumor. 2. Persistent 5.8 x 4.2 cm right adnexal lesion. Poorly visualized known hepatic metastases. Redemonstration of peritoneal and mesenteric soft tissue densities/nodules. 3. Cholelithiasis with no findings of acute cholecystitis. 4. Nonobstructive 1-3 mm right nephrolithiasis. 5. Scattered colonic diverticula with no acute diverticulitis. 6.  Aortic Atherosclerosis (ICD10-I70.0). Electronically Signed   By: Iven Finn M.D.   On: 05/14/2021 22:38    Procedures Procedures   Medications Ordered in ED Medications  oxyCODONE-acetaminophen (PERCOCET/ROXICET) 5-325 MG per tablet 1 tablet (1 tablet Oral Patient Refused/Not Given 05/14/21 2148)    ED Course  I have reviewed the triage vital signs and the nursing notes.  Pertinent labs & imaging results that were available during my care of the patient were reviewed by me and considered in my medical decision making (see chart for details).    MDM Rules/Calculators/A&P                           Pt is feeling better.  She has been switching between Dr. Burr Medico ([facility]) and Dr. Leamon Arnt ([facility]) for her metastatic carcinoid tumor.  She has not had her dose of Sandostatin since 7/29.  She has been having trouble getting a company who will come out to her house and give it to her.  As her care is going to Dr. Leamon Arnt, I told her to call his office to see if his office  can arrange this medication.  I offered to give it here, but she was worried her insurance won't pay for it.  Pt is stable for d/c.  Return if worse.  F/u with pcp. Final Clinical Impression(s) / ED Diagnoses Final diagnoses:  Malignant carcinoid tumor, unspecified site [facility])  Generalized abdominal pain  Renal colic    Rx / DC Orders ED Discharge Orders          Ordered    oxyCODONE-acetaminophen (PERCOCET/ROXICET) 5-325 MG tablet  Every  6 hours PRN        05/14/21 2302    ondansetron (ZOFRAN ODT) 4 MG disintegrating tablet  Every 8 hours PRN        05/14/21 2303             Isla Pence, MD 05/14/21 2308

## 2021-05-14 NOTE — ED Triage Notes (Signed)
Pt reports abdominal pain, lower back pain, and nausea that began this morning. Pt denies V/D and rectal bleeding.

## 2021-06-07 DIAGNOSIS — R69 Illness, unspecified: Secondary | ICD-10-CM | POA: Diagnosis not present

## 2021-07-10 DIAGNOSIS — C787 Secondary malignant neoplasm of liver and intrahepatic bile duct: Secondary | ICD-10-CM | POA: Diagnosis not present

## 2021-07-10 DIAGNOSIS — C786 Secondary malignant neoplasm of retroperitoneum and peritoneum: Secondary | ICD-10-CM | POA: Diagnosis not present

## 2021-07-10 DIAGNOSIS — D3A8 Other benign neuroendocrine tumors: Secondary | ICD-10-CM | POA: Diagnosis not present

## 2021-07-10 DIAGNOSIS — C7B8 Other secondary neuroendocrine tumors: Secondary | ICD-10-CM | POA: Diagnosis not present

## 2021-10-08 DIAGNOSIS — R69 Illness, unspecified: Secondary | ICD-10-CM | POA: Diagnosis not present

## 2021-10-09 DIAGNOSIS — C7B8 Other secondary neuroendocrine tumors: Secondary | ICD-10-CM | POA: Diagnosis not present

## 2021-10-09 DIAGNOSIS — D3A8 Other benign neuroendocrine tumors: Secondary | ICD-10-CM | POA: Diagnosis not present

## 2021-10-09 DIAGNOSIS — C786 Secondary malignant neoplasm of retroperitoneum and peritoneum: Secondary | ICD-10-CM | POA: Diagnosis not present

## 2021-10-09 DIAGNOSIS — C7A8 Other malignant neuroendocrine tumors: Secondary | ICD-10-CM | POA: Diagnosis not present

## 2021-10-09 DIAGNOSIS — C787 Secondary malignant neoplasm of liver and intrahepatic bile duct: Secondary | ICD-10-CM | POA: Diagnosis not present

## 2022-06-14 HISTORY — PX: CARDIAC VALVE REPLACEMENT: SHX585

## 2022-09-10 ENCOUNTER — Telehealth (HOSPITAL_COMMUNITY): Payer: Self-pay

## 2022-09-10 ENCOUNTER — Encounter: Payer: Self-pay | Admitting: Oncology

## 2022-09-10 NOTE — Telephone Encounter (Signed)
Called patient to see if she was interested in participating in the Cardiac Rehab Program. Patient stated yes. Patient will come in for orientation on 09/19/22 @ 10:30AM and will attend the 11:45AM exercise class. Went over insurance, patient verbalized understanding.   Tourist information centre manager.

## 2022-09-10 NOTE — Telephone Encounter (Signed)
Pt insurance is active and benefits verified through Medicare A/B. Co-pay $0.00, DED $240.00/$240.00 met, out of pocket $0.00/$0.00 met, co-insurance 20%. No pre-authorization required. Passport, 09/10/22 @ 4:17PM, REF#[id number]   How many CR sessions are covered? (36 sessions for TCR, 72 sessions for ICR)72 Is this a lifetime maximum or an annual maximum? Lifetime Has the member used any of these services to date? No Is there a time limit (weeks/months) on start of program and/or program completion? No   2ndary insurance is active and benefits verified through [facility]. Co-pay $0.00, DED $0.00/$0.00 met, out of pocket $0.00/$0.00 met, co-insurance 0%. No pre-authorization required. Passport, 09/10/22 @ 4:20PM, [id number]      Will contact patient to see if she is interested in the Cardiac Rehab Program.

## 2022-09-18 ENCOUNTER — Telehealth (HOSPITAL_COMMUNITY): Payer: Self-pay

## 2022-09-18 NOTE — Telephone Encounter (Signed)
Pt called to confirm her Cardiac Rehab Orientation appointment for tomorrow at 1030. RN also completed the patient's CR Nursing Assessment. Directions and instructions for the appointment provided. Pt understands without assistance.

## 2022-09-19 ENCOUNTER — Encounter (HOSPITAL_COMMUNITY)
Admission: RE | Admit: 2022-09-19 | Discharge: 2022-09-19 | Disposition: A | Discharge status: Home / Self Care | Payer: Medicare Other | Source: Ambulatory Visit | Attending: Cardiology | Admitting: Cardiology

## 2022-09-19 ENCOUNTER — Encounter: Payer: Self-pay | Admitting: Oncology

## 2022-09-19 VITALS — BP 120/80 | HR 78 | Ht 62.5 in | Wt 104.3 lb

## 2022-09-19 DIAGNOSIS — Z48812 Encounter for surgical aftercare following surgery on the circulatory system: Secondary | ICD-10-CM | POA: Diagnosis not present

## 2022-09-19 DIAGNOSIS — Z952 Presence of prosthetic heart valve: Secondary | ICD-10-CM | POA: Insufficient documentation

## 2022-09-19 DIAGNOSIS — Z954 Presence of other heart-valve replacement: Secondary | ICD-10-CM | POA: Insufficient documentation

## 2022-09-19 NOTE — Progress Notes (Signed)
Cardiac Rehab Medication Review   Does the patient  feel that his/her medications are working for him/her?  yes  Has the patient been experiencing any side effects to the medications prescribed?  no  Does the patient measure his/her own blood pressure or blood glucose at home?  no   Does the patient have any problems obtaining medications due to transportation or finances?   no  Understanding of regimen: excellent Understanding of indications: excellent Potential of compliance: excellent    Comments: Pt understands her medications and their purpose. No issues identified.     Lesly Rubenstein 09/19/2022 2:25 PM

## 2022-09-19 NOTE — Progress Notes (Signed)
Cardiac Individual Treatment Plan  Patient Details  Name: Vanessa Waller MRN: [id number] Date of Birth: 1956/04/30 Referring Provider:   Flowsheet Row INTENSIVE CARDIAC REHAB ORIENT from 09/19/2022 in [facility]  Referring Provider Dr. Mina Marble (Fransico Him, MD covering)       Initial Encounter Date:  [place] from 09/19/2022 in [facility]  Date 09/19/22       Visit Diagnosis: 06/27/22 S/P tricuspid valve replacement, S/P aortic valve replacement  06/27/22 S/P pulmonary valve replacement  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,mesentery,pelvis   Nonspecific elevation of levels of transaminase or lactic acid dehydrogenase (LDH)    Small bowel obstruction ([place]) 2007   Uterine fibroid     Tobacco Use: Social History   Tobacco Use  Smoking Status Never  Smokeless Tobacco Never    Labs: Review Flowsheet       Latest Ref Rng & Units 11/30/2012 07/11/2015 01/16/2018  Labs for ITP Cardiac and Pulmonary Rehab  Cholestrol 125 - 200 mg/dL 171  160  -  LDL (calc) <130 mg/dL 82  88  -  HDL-C >=46 mg/dL 50  48  -  Trlycerides <150 mg/dL 196  120  -  TCO2 22 - 32 mmol/L - - 28     Capillary Blood Glucose: Lab Results  Component Value Date   GLUCAP 141 (H) 07/03/2012    GLUCAP 94 07/01/2012     Exercise Target Goals: Exercise Program Goal: Individual exercise prescription set using results from initial 6 min walk test and THRR while considering  patient's activity barriers and safety.   Exercise Prescription Goal: Initial exercise prescription builds to 30-45 minutes a day of aerobic activity, 2-3 days per week.  Home exercise guidelines will be given to patient during program as part of exercise prescription that the participant will acknowledge.  Activity Barriers & Risk Stratification:  Activity Barriers & Cardiac Risk Stratification - 09/19/22 1440       Activity Barriers & Cardiac Risk Stratification   Activity Barriers Deconditioning;Muscular Weakness;Shortness of Breath;Other (comment)    Comments Sternal Precautions    Cardiac Risk Stratification High             6 Minute Walk:  6 Minute Walk     Row Name 09/19/22 1134         6 Minute Walk   Phase Initial     Distance 1369 feet     Walk Time 6 minutes     # of Rest Breaks 0     MPH 2.6     METS 3.63     RPE 11     Perceived Dyspnea  1     VO2 Peak 12.72     Symptoms Yes (comment)  Comments SOB, RPD = 1     Resting HR 79 bpm     Resting BP 120/80     Resting Oxygen Saturation  100 %     Exercise Oxygen Saturation  during 6 min walk 99 %     Max Ex. HR 101 bpm     Max Ex. BP 128/70     2 Minute Post BP 122/78              Oxygen Initial Assessment:   Oxygen Re-Evaluation:   Oxygen Discharge (Final Oxygen Re-Evaluation):   Initial Exercise Prescription:  Initial Exercise Prescription - 09/19/22 1400       Date of Initial Exercise RX and Referring Provider   Date 09/19/22    Referring Provider Dr. Mina Marble Fransico Him, MD covering)    Expected Discharge Date 11/29/22      Recumbant Bike   Level 1.5    RPM 60    Watts 25    Minutes 15    METs 3.6      NuStep   Level 2    SPM 75    Minutes 15    METs 3.6      Prescription Details    Frequency (times per week) 3    Duration Progress to 30 minutes of continuous aerobic without signs/symptoms of physical distress      Intensity   THRR 40-80% of Max Heartrate 62 -124    Ratings of Perceived Exertion 11-13    Perceived Dyspnea 0-4      Progression   Progression Continue progressive overload as per policy without signs/symptoms or physical distress.      Resistance Training   Training Prescription Yes    Weight 2 lbs    Reps 10-15             Perform Capillary Blood Glucose checks as needed.  Exercise Prescription Changes:   Exercise Comments:   Exercise Goals and Review:   Exercise Goals     Row Name 09/19/22 1441             Exercise Goals   Increase Physical Activity Yes       Intervention Provide advice, education, support and counseling about physical activity/exercise needs.;Develop an individualized exercise prescription for aerobic and resistive training based on initial evaluation findings, risk stratification, comorbidities and participant's personal goals.       Expected Outcomes Short Term: Attend rehab on a regular basis to increase amount of physical activity.;Long Term: Add in home exercise to make exercise part of routine and to increase amount of physical activity.;Long Term: Exercising regularly at least 3-5 days a week.       Increase Strength and Stamina Yes       Intervention Provide advice, education, support and counseling about physical activity/exercise needs.;Develop an individualized exercise prescription for aerobic and resistive training based on initial evaluation findings, risk stratification, comorbidities and participant's personal goals.       Expected Outcomes Short Term: Increase workloads from initial exercise prescription for resistance, speed, and METs.;Short Term: Perform resistance training exercises routinely during rehab and add in resistance training at home;Long Term: Improve cardiorespiratory fitness, muscular  endurance and strength as measured by increased METs and functional capacity (6MWT)       Able to understand and use rate of perceived exertion (RPE) scale Yes       Intervention Provide education and explanation on how to use RPE scale  Expected Outcomes Short Term: Able to use RPE daily in rehab to express subjective intensity level;Long Term:  Able to use RPE to guide intensity level when exercising independently       Knowledge and understanding of Target Heart Rate Range (THRR) Yes       Intervention Provide education and explanation of THRR including how the numbers were predicted and where they are located for reference       Expected Outcomes Short Term: Able to state/look up THRR;Short Term: Able to use daily as guideline for intensity in rehab;Long Term: Able to use THRR to govern intensity when exercising independently       Understanding of Exercise Prescription Yes       Intervention Provide education, explanation, and written materials on patient's individual exercise prescription       Expected Outcomes Short Term: Able to explain program exercise prescription;Long Term: Able to explain home exercise prescription to exercise independently                Exercise Goals Re-Evaluation :   Discharge Exercise Prescription (Final Exercise Prescription Changes):   Nutrition:  Target Goals: Understanding of nutrition guidelines, daily intake of sodium '1500mg'$ , cholesterol '200mg'$ , calories 30% from fat and 7% or less from saturated fats, daily to have 5 or more servings of fruits and vegetables.  Biometrics:  Pre Biometrics - 09/19/22 1045       Pre Biometrics   Waist Circumference 29.5 inches    Hip Circumference 35.5 inches    Waist to Hip Ratio 0.83 %    Triceps Skinfold 10 mm    % Body Fat 27.2 %    Grip Strength 22 kg    Flexibility 0 in   could not reach box   Single Leg Stand 30 seconds              Nutrition Therapy Plan and Nutrition  Goals:   Nutrition Assessments:  MEDIFICTS Score Key: ?70 Need to make dietary changes  40-70 Heart Healthy Diet ? 40 Therapeutic Level Cholesterol Diet    Picture Your Plate Scores: [id number] Unhealthy dietary pattern with much room for improvement. 41-50 Dietary pattern unlikely to meet recommendations for good health and room for improvement. 51-60 More healthful dietary pattern, with some room for improvement.  >60 Healthy dietary pattern, although there may be some specific behaviors that could be improved.    Nutrition Goals Re-Evaluation:   Nutrition Goals Re-Evaluation:   Nutrition Goals Discharge (Final Nutrition Goals Re-Evaluation):   Psychosocial: Target Goals: Acknowledge presence or absence of significant depression and/or stress, maximize coping skills, provide positive support system. Participant is able to verbalize types and ability to use techniques and skills needed for reducing stress and depression.  Initial Review & Psychosocial Screening:  Initial Psych Review & Screening - 09/19/22 1428       Initial Review   Current issues with None Identified      Family Dynamics   Good Support System? Yes    Comments Pt has her sister and currently lives with her s/p surgery. Pt also voices having other family members for support.      Barriers   Psychosocial barriers to participate in program There are no identifiable barriers or psychosocial needs.      Screening Interventions   Interventions Encouraged to exercise             Quality of Life Scores:  Quality of Life - 09/19/22 1432  Quality of Life   Select Quality of Life      Quality of Life Scores   Health/Function Pre 20.43 %    Socioeconomic Pre 20.36 %    Psych/Spiritual Pre 22.5 %    Family Pre 19.17 %    GLOBAL Pre 20.69 %            Scores of 19 and below usually indicate a poorer quality of life in these areas.  A difference of  2-3 points is a clinically meaningful  difference.  A difference of 2-3 points in the total score of the Quality of Life Index has been associated with significant improvement in overall quality of life, self-image, physical symptoms, and general health in studies assessing change in quality of life.  PHQ-9: Review Flowsheet       09/19/2022  Depression screen PHQ 2/9  Decreased Interest 0  Down, Depressed, Hopeless 0  PHQ - 2 Score 0  Altered sleeping 0  Tired, decreased energy 0  Change in appetite 0  Feeling bad or failure about yourself  0  Trouble concentrating 0  Moving slowly or fidgety/restless 0  Suicidal thoughts 0  PHQ-9 Score 0  Difficult doing work/chores Not difficult at all   Interpretation of Total Score  Total Score Depression Severity:  1-4 = Minimal depression, 5-9 = Mild depression, 10-14 = Moderate depression, 15-19 = Moderately severe depression, 20-27 = Severe depression   Psychosocial Evaluation and Intervention:   Psychosocial Re-Evaluation:   Psychosocial Discharge (Final Psychosocial Re-Evaluation):   Vocational Rehabilitation: Provide vocational rehab assistance to qualifying candidates.   Vocational Rehab Evaluation & Intervention:  Vocational Rehab - 09/19/22 1433       Initial Vocational Rehab Evaluation & Intervention   Assessment shows need for Vocational Rehabilitation No      Vocational Rehab Re-Evaulation   Comments Pt is retired and has no needs at this time             Education: Education Goals: Education classes will be provided on a weekly basis, covering required topics. Participant will state understanding/return demonstration of topics presented.     Core Videos: Exercise    Move It!  Clinical staff conducted group or individual video education with verbal and written material and guidebook.  Patient learns the recommended Pritikin exercise program. Exercise with the goal of living a long, healthy life. Some of the health benefits of exercise include  controlled diabetes, healthier blood pressure levels, improved cholesterol levels, improved heart and lung capacity, improved sleep, and better body composition. Everyone should speak with their doctor before starting or changing an exercise routine.  Biomechanical Limitations Clinical staff conducted group or individual video education with verbal and written material and guidebook.  Patient learns how biomechanical limitations can impact exercise and how we can mitigate and possibly overcome limitations to have an impactful and balanced exercise routine.  Body Composition Clinical staff conducted group or individual video education with verbal and written material and guidebook.  Patient learns that body composition (ratio of muscle mass to fat mass) is a key component to assessing overall fitness, rather than body weight alone. Increased fat mass, especially visceral belly fat, can put [place] at increased risk for metabolic syndrome, type 2 diabetes, heart disease, and even death. It is recommended to combine diet and exercise (cardiovascular and resistance training) to improve your body composition. Seek guidance from your physician and exercise physiologist before implementing an exercise routine.  Exercise Action Plan Clinical staff conducted  group or individual video education with verbal and written material and guidebook.  Patient learns the recommended strategies to achieve and enjoy long-term exercise adherence, including variety, self-motivation, self-efficacy, and positive decision making. Benefits of exercise include fitness, good health, weight management, more energy, better sleep, less stress, and overall well-being.  Medical   Heart Disease Risk Reduction Clinical staff conducted group or individual video education with verbal and written material and guidebook.  Patient learns our heart is our most vital organ as it circulates oxygen, nutrients, white blood cells, and hormones  throughout the entire body, and carries waste away. Data supports a plant-based eating plan like the Pritikin Program for its effectiveness in slowing progression of and reversing heart disease. The video provides a number of recommendations to address heart disease.   Metabolic Syndrome and Belly Fat  Clinical staff conducted group or individual video education with verbal and written material and guidebook.  Patient learns what metabolic syndrome is, how it leads to heart disease, and how one can reverse it and keep it from coming back. You have metabolic syndrome if you have 3 of the following 5 criteria: abdominal obesity, high blood pressure, high triglycerides, low HDL cholesterol, and high blood sugar.  Hypertension and Heart Disease Clinical staff conducted group or individual video education with verbal and written material and guidebook.  Patient learns that high blood pressure, or hypertension, is very common in the [place]. Hypertension is largely due to excessive salt intake, but other important risk factors include being overweight, physical inactivity, drinking too much alcohol, smoking, and not eating enough potassium from fruits and vegetables. High blood pressure is a leading risk factor for heart attack, stroke, congestive heart failure, dementia, kidney failure, and premature death. Long-term effects of excessive salt intake include stiffening of the arteries and thickening of heart muscle and organ damage. Recommendations include ways to reduce hypertension and the risk of heart disease.  Diseases of Our Time - Focusing on Diabetes Clinical staff conducted group or individual video education with verbal and written material and guidebook.  Patient learns why the best way to stop diseases of our time is prevention, through food and other lifestyle changes. Medicine (such as prescription pills and surgeries) is often only a Band-Aid on the problem, not a long-term solution. Most  common diseases of our time include obesity, type 2 diabetes, hypertension, heart disease, and cancer. The Pritikin Program is recommended and has been proven to help reduce, reverse, and/or prevent the damaging effects of metabolic syndrome.  Nutrition   Overview of the Pritikin Eating Plan  Clinical staff conducted group or individual video education with verbal and written material and guidebook.  Patient learns about the [place] for disease risk reduction. The [place] emphasizes a wide variety of unrefined, minimally-processed carbohydrates, like fruits, vegetables, whole grains, and legumes. Go, Caution, and Stop food choices are explained. Plant-based and lean animal proteins are emphasized. Rationale provided for low sodium intake for blood pressure control, low added sugars for blood sugar stabilization, and low added fats and oils for coronary artery disease risk reduction and weight management.  Calorie Density  Clinical staff conducted group or individual video education with verbal and written material and guidebook.  Patient learns about calorie density and how it impacts the Pritikin Eating Plan. Knowing the characteristics of the food you choose will help you decide whether those foods will lead to weight gain or weight loss, and whether you want to consume more or  less of them. Weight loss is usually a side effect of the Pritikin Eating Plan because of its focus on low calorie-dense foods.  Label Reading  Clinical staff conducted group or individual video education with verbal and written material and guidebook.  Patient learns about the Pritikin recommended label reading guidelines and corresponding recommendations regarding calorie density, added sugars, sodium content, and whole grains.  Dining Out - Part 1  Clinical staff conducted group or individual video education with verbal and written material and guidebook.  Patient learns that restaurant meals  can be sabotaging because they can be so high in calories, fat, sodium, and/or sugar. Patient learns recommended strategies on how to positively address this and avoid unhealthy pitfalls.  Facts on Fats  Clinical staff conducted group or individual video education with verbal and written material and guidebook.  Patient learns that lifestyle modifications can be just as effective, if not more so, as many medications for lowering your risk of heart disease. A Pritikin lifestyle can help to reduce your risk of inflammation and atherosclerosis (cholesterol build-up, or plaque, in the artery walls). Lifestyle interventions such as dietary choices and physical activity address the cause of atherosclerosis. A review of the types of fats and their impact on blood cholesterol levels, along with dietary recommendations to reduce fat intake is also included.  Nutrition Action Plan  Clinical staff conducted group or individual video education with verbal and written material and guidebook.  Patient learns how to incorporate Pritikin recommendations into their lifestyle. Recommendations include planning and keeping personal health goals in mind as an important part of their success.  Healthy Mind-Set    Healthy Minds, Bodies, Hearts  Clinical staff conducted group or individual video education with verbal and written material and guidebook.  Patient learns how to identify when they are stressed. Video will discuss the impact of that stress, as well as the many benefits of stress management. Patient will also be introduced to stress management techniques. The way we think, act, and feel has an impact on our hearts.  How Our Thoughts Can Heal Our Hearts  Clinical staff conducted group or individual video education with verbal and written material and guidebook.  Patient learns that negative thoughts can cause depression and anxiety. This can result in negative lifestyle behavior and serious health problems.  Cognitive behavioral therapy is an effective method to help control our thoughts in order to change and improve our emotional outlook.  Additional Videos:  Exercise    Improving Performance  Clinical staff conducted group or individual video education with verbal and written material and guidebook.  Patient learns to use a non-linear approach by alternating intensity levels and lengths of time spent exercising to help burn more calories and lose more body fat. Cardiovascular exercise helps improve heart health, metabolism, hormonal balance, blood sugar control, and recovery from fatigue. Resistance training improves strength, endurance, balance, coordination, reaction time, metabolism, and muscle mass. Flexibility exercise improves circulation, posture, and balance. Seek guidance from your physician and exercise physiologist before implementing an exercise routine and learn your capabilities and proper form for all exercise.  Introduction to Yoga  Clinical staff conducted group or individual video education with verbal and written material and guidebook.  Patient learns about yoga, a discipline of the coming together of mind, breath, and body. The benefits of yoga include improved flexibility, improved range of motion, better posture and core strength, increased lung function, weight loss, and positive self-image. Yoga's heart health benefits include lowered blood pressure,  healthier heart rate, decreased cholesterol and triglyceride levels, improved immune function, and reduced stress. Seek guidance from your physician and exercise physiologist before implementing an exercise routine and learn your capabilities and proper form for all exercise.  Medical   Aging: Enhancing Your Quality of Life  Clinical staff conducted group or individual video education with verbal and written material and guidebook.  Patient learns key strategies and recommendations to stay in good physical health and enhance  quality of life, such as prevention strategies, having an advocate, securing a [place], and keeping a list of medications and system for tracking them. It also discusses how to avoid risk for bone loss.  Biology of Weight Control  Clinical staff conducted group or individual video education with verbal and written material and guidebook.  Patient learns that weight gain occurs because we consume more calories than we burn (eating more, moving less). Even if your body weight is normal, you may have higher ratios of fat compared to muscle mass. Too much body fat puts you at increased risk for cardiovascular disease, heart attack, stroke, type 2 diabetes, and obesity-related cancers. In addition to exercise, following the [place] can help reduce your risk.  Decoding Lab Results  Clinical staff conducted group or individual video education with verbal and written material and guidebook.  Patient learns that lab test reflects one measurement whose values change over time and are influenced by many factors, including medication, stress, sleep, exercise, food, hydration, pre-existing medical conditions, and more. It is recommended to use the knowledge from this video to become more involved with your lab results and evaluate your numbers to speak with your doctor.   Diseases of Our Time - Overview  Clinical staff conducted group or individual video education with verbal and written material and guidebook.  Patient learns that according to the CDC, 50% to 70% of chronic diseases (such as obesity, type 2 diabetes, elevated lipids, hypertension, and heart disease) are avoidable through lifestyle improvements including healthier food choices, listening to satiety cues, and increased physical activity.  Sleep Disorders Clinical staff conducted group or individual video education with verbal and written material and guidebook.  Patient learns how good quality and  duration of sleep are important to overall health and well-being. Patient also learns about sleep disorders and how they impact health along with recommendations to address them, including discussing with a physician.  Nutrition  Dining Out - Part 2 Clinical staff conducted group or individual video education with verbal and written material and guidebook.  Patient learns how to plan ahead and communicate in order to maximize their dining experience in a healthy and nutritious manner. Included are recommended food choices based on the type of restaurant the patient is visiting.   Fueling a Best boy conducted group or individual video education with verbal and written material and guidebook.  There is a strong connection between our food choices and our health. Diseases like obesity and type 2 diabetes are very prevalent and are in large-part due to lifestyle choices. The Pritikin Eating Plan provides plenty of food and hunger-curbing satisfaction. It is easy to follow, affordable, and helps reduce health risks.  Menu Workshop  Clinical staff conducted group or individual video education with verbal and written material and guidebook.  Patient learns that restaurant meals can sabotage health goals because they are often packed with calories, fat, sodium, and sugar. Recommendations include strategies to plan ahead and to communicate  with the manager, chef, or server to help order a healthier meal.  Planning Your Eating Strategy  Clinical staff conducted group or individual video education with verbal and written material and guidebook.  Patient learns about the [place] and its benefit of reducing the risk of disease. The [place] does not focus on calories. Instead, it emphasizes high-quality, nutrient-rich foods. By knowing the characteristics of the foods, we choose, we can determine their calorie density and make informed decisions.  Targeting Your  Nutrition Priorities  Clinical staff conducted group or individual video education with verbal and written material and guidebook.  Patient learns that lifestyle habits have a tremendous impact on disease risk and progression. This video provides eating and physical activity recommendations based on your personal health goals, such as reducing LDL cholesterol, losing weight, preventing or controlling type 2 diabetes, and reducing high blood pressure.  Vitamins and Minerals  Clinical staff conducted group or individual video education with verbal and written material and guidebook.  Patient learns different ways to obtain key vitamins and minerals, including through a recommended healthy diet. It is important to discuss all supplements you take with your doctor.   Healthy Mind-Set    Smoking Cessation  Clinical staff conducted group or individual video education with verbal and written material and guidebook.  Patient learns that cigarette smoking and tobacco addiction pose a serious health risk which affects millions of people. Stopping smoking will significantly reduce the risk of heart disease, lung disease, and many forms of cancer. Recommended strategies for quitting are covered, including working with your doctor to develop a successful plan.  Culinary   Becoming a Financial trader conducted group or individual video education with verbal and written material and guidebook.  Patient learns that cooking at home can be healthy, cost-effective, quick, and puts them in control. Keys to cooking healthy recipes will include looking at your recipe, assessing your equipment needs, planning ahead, making it simple, choosing cost-effective seasonal ingredients, and limiting the use of added fats, salts, and sugars.  Cooking - Breakfast and Snacks  Clinical staff conducted group or individual video education with verbal and written material and guidebook.  Patient learns how important  breakfast is to satiety and nutrition through the entire day. Recommendations include key foods to eat during breakfast to help stabilize blood sugar levels and to prevent overeating at meals later in the day. Planning ahead is also a key component.  Cooking - Human resources officer conducted group or individual video education with verbal and written material and guidebook.  Patient learns eating strategies to improve overall health, including an approach to cook more at home. Recommendations include thinking of animal protein as a side on your plate rather than center stage and focusing instead on lower calorie dense options like vegetables, fruits, whole grains, and plant-based proteins, such as beans. Making sauces in large quantities to freeze for later and leaving the skin on your vegetables are also recommended to maximize your experience.  Cooking - Healthy Salads and Dressing Clinical staff conducted group or individual video education with verbal and written material and guidebook.  Patient learns that vegetables, fruits, whole grains, and legumes are the foundations of the [place]. Recommendations include how to incorporate each of these in flavorful and healthy salads, and how to create homemade salad dressings. Proper handling of ingredients is also covered. Cooking - Soups and Dance movement psychotherapist  conducted group or individual video education with verbal and written material and guidebook.  Patient learns that Pritikin soups and desserts make for easy, nutritious, and delicious snacks and meal components that are low in sodium, fat, sugar, and calorie density, while high in vitamins, minerals, and filling fiber. Recommendations include simple and healthy ideas for soups and desserts.   Overview     The Pritikin Solution Program Overview Clinical staff conducted group or individual video education with verbal and written material  and guidebook.  Patient learns that the results of the [place] Program have been documented in more than 100 articles published in peer-reviewed journals, and the benefits include reducing risk factors for (and, in some cases, even reversing) high cholesterol, high blood pressure, type 2 diabetes, obesity, and more! An overview of the three key pillars of the Pritikin Program will be covered: eating well, doing regular exercise, and having a healthy mind-set.  WORKSHOPS  Exercise: Exercise Basics: Building Your Action Plan Clinical staff led group instruction and group discussion with PowerPoint presentation and patient guidebook. To enhance the learning environment the use of posters, models and videos may be added. At the conclusion of this workshop, patients will comprehend the difference between physical activity and exercise, as well as the benefits of incorporating both, into their routine. Patients will understand the FITT (Frequency, Intensity, Time, and Type) principle and how to use it to build an exercise action plan. In addition, safety concerns and other considerations for exercise and cardiac rehab will be addressed by the presenter. The purpose of this lesson is to promote a comprehensive and effective weekly exercise routine in order to improve patients' overall level of fitness.   Managing Heart Disease: Your Path to a Healthier Heart Clinical staff led group instruction and group discussion with PowerPoint presentation and patient guidebook. To enhance the learning environment the use of posters, models and videos may be added.At the conclusion of this workshop, patients will understand the anatomy and physiology of the heart. Additionally, they will understand how Pritikin's three pillars impact the risk factors, the progression, and the management of heart disease.  The purpose of this lesson is to provide a high-level overview of the heart, heart disease, and how the Pritikin  lifestyle positively impacts risk factors.  Exercise Biomechanics Clinical staff led group instruction and group discussion with PowerPoint presentation and patient guidebook. To enhance the learning environment the use of posters, models and videos may be added. Patients will learn how the structural parts of their bodies function and how these functions impact their daily activities, movement, and exercise. Patients will learn how to promote a neutral spine, learn how to manage pain, and identify ways to improve their physical movement in order to promote healthy living. The purpose of this lesson is to expose patients to common physical limitations that impact physical activity. Participants will learn practical ways to adapt and manage aches and pains, and to minimize their effect on regular exercise. Patients will learn how to maintain good posture while sitting, walking, and lifting.  Balance Training and Fall Prevention  Clinical staff led group instruction and group discussion with PowerPoint presentation and patient guidebook. To enhance the learning environment the use of posters, models and videos may be added. At the conclusion of this workshop, patients will understand the importance of their sensorimotor skills (vision, proprioception, and the vestibular system) in maintaining their ability to balance as they age. Patients will apply a variety of balancing exercises that are appropriate for  their current level of function. Patients will understand the common causes for poor balance, possible solutions to these problems, and ways to modify their physical environment in order to minimize their fall risk. The purpose of this lesson is to teach patients about the importance of maintaining balance as they age and ways to minimize their risk of falling.  WORKSHOPS   Nutrition:  Fueling a Scientist, research (physical sciences) led group instruction and group discussion with PowerPoint presentation  and patient guidebook. To enhance the learning environment the use of posters, models and videos may be added. Patients will review the foundational principles of the [place] and understand what constitutes a serving size in each of the food groups. Patients will also learn Pritikin-friendly foods that are better choices when away from home and review make-ahead meal and snack options. Calorie density will be reviewed and applied to three nutrition priorities: weight maintenance, weight loss, and weight gain. The purpose of this lesson is to reinforce (in a group setting) the key concepts around what patients are recommended to eat and how to apply these guidelines when away from home by planning and selecting Pritikin-friendly options. Patients will understand how calorie density may be adjusted for different weight management goals.  Mindful Eating  Clinical staff led group instruction and group discussion with PowerPoint presentation and patient guidebook. To enhance the learning environment the use of posters, models and videos may be added. Patients will briefly review the concepts of the [place] and the importance of low-calorie dense foods. The concept of mindful eating will be introduced as well as the importance of paying attention to internal hunger signals. Triggers for non-hunger eating and techniques for dealing with triggers will be explored. The purpose of this lesson is to provide patients with the opportunity to review the basic principles of the [place], discuss the value of eating mindfully and how to measure internal cues of hunger and fullness using the Hunger Scale. Patients will also discuss reasons for non-hunger eating and learn strategies to use for controlling emotional eating.  Targeting Your Nutrition Priorities Clinical staff led group instruction and group discussion with PowerPoint presentation and patient guidebook. To enhance the  learning environment the use of posters, models and videos may be added. Patients will learn how to determine their genetic susceptibility to disease by reviewing their family history. Patients will gain insight into the importance of diet as part of an overall healthy lifestyle in mitigating the impact of genetics and other environmental insults. The purpose of this lesson is to provide patients with the opportunity to assess their personal nutrition priorities by looking at their family history, their own health history and current risk factors. Patients will also be able to discuss ways of prioritizing and modifying the [place] for their highest risk areas  Menu  Clinical staff led group instruction and group discussion with PowerPoint presentation and patient guidebook. To enhance the learning environment the use of posters, models and videos may be added. Using menus brought in from [employer], or printed from [employer], patients will apply the [place] dining out guidelines that were presented in the [employer] video. Patients will also be able to practice these guidelines in a variety of provided scenarios. The purpose of this lesson is to provide patients with the opportunity to practice hands-on learning of the [place] with actual menus and practice scenarios.  Label Reading Clinical staff led group instruction  and group discussion with PowerPoint presentation and patient guidebook. To enhance the learning environment the use of posters, models and videos may be added. Patients will review and discuss the Pritikin label reading guidelines presented in Pritikin's Label Reading Educational series video. Using fool labels brought in from local grocery stores and markets, patients will apply the label reading guidelines and determine if the packaged food meet the Pritikin guidelines. The purpose of this lesson is to provide patients with  the opportunity to review, discuss, and practice hands-on learning of the Pritikin Label Reading guidelines with actual packaged food labels. [place] Workshops are designed to teach patients ways to prepare quick, simple, and affordable recipes at home. The importance of nutrition's role in chronic disease risk reduction is reflected in its emphasis in the overall Pritikin program. By learning how to prepare essential core Pritikin Eating Plan recipes, patients will increase control over what they eat; be able to customize the flavor of foods without the use of added salt, sugar, or fat; and improve the quality of the food they consume. By learning a set of core recipes which are easily assembled, quickly prepared, and affordable, patients are more likely to prepare more healthy foods at home. These workshops focus on convenient breakfasts, simple entres, side dishes, and desserts which can be prepared with minimal effort and are consistent with nutrition recommendations for cardiovascular risk reduction. Cooking [employer] are taught by a Engineer, materials (RD) who has been trained by the [employer]. The chef or RD has a clear understanding of the importance of minimizing - if not completely eliminating - added fat, sugar, and sodium in recipes. Throughout the series of [place] Workshop sessions, patients will learn about healthy ingredients and efficient methods of cooking to build confidence in their capability to prepare    Cooking School weekly topics:  Adding Flavor- Sodium-Free  Fast and Healthy Breakfasts  Powerhouse Plant-Based Proteins  Satisfying Salads and Dressings  Simple Sides and Sauces  International Cuisine-Spotlight on the [place] Zones  Delicious Desserts  Savory Soups  Efficiency Cooking - Meals in a Snap  Tasty Appetizers and Snacks  Comforting Weekend Breakfasts  One-Pot Wonders   Fast Evening  Meals  Easy [place] (Psychosocial): New Thoughts, New Behaviors Clinical staff led group instruction and group discussion with PowerPoint presentation and patient guidebook. To enhance the learning environment the use of posters, models and videos may be added. Patients will learn and practice techniques for developing effective health and lifestyle goals. Patients will be able to effectively apply the goal setting process learned to develop at least one new personal goal.  The purpose of this lesson is to expose patients to a new skill set of behavior modification techniques such as techniques setting SMART goals, overcoming barriers, and achieving new thoughts and new behaviors.  Managing Moods and Relationships Clinical staff led group instruction and group discussion with PowerPoint presentation and patient guidebook. To enhance the learning environment the use of posters, models and videos may be added. Patients will learn how emotional and chronic stress factors can impact their health and relationships. They will learn healthy ways to manage their moods and utilize positive coping mechanisms. In addition, ICR patients will learn ways to improve communication skills. The purpose of this lesson is to expose patients to ways of understanding how one's mood and health are intimately connected. Developing a healthy  outlook can help build positive relationships and connections with others. Patients will understand the importance of utilizing effective communication skills that include actively listening and being heard. They will learn and understand the importance of the "4 Cs" and especially Connections in fostering of a Healthy Mind-Set.  Healthy Sleep for a Healthy Heart Clinical staff led group instruction and group discussion with PowerPoint presentation and patient guidebook. To enhance the learning environment the use of  posters, models and videos may be added. At the conclusion of this workshop, patients will be able to demonstrate knowledge of the importance of sleep to overall health, well-being, and quality of life. They will understand the symptoms of, and treatments for, common sleep disorders. Patients will also be able to identify daytime and nighttime behaviors which impact sleep, and they will be able to apply these tools to help manage sleep-related challenges. The purpose of this lesson is to provide patients with a general overview of sleep and outline the importance of quality sleep. Patients will learn about a few of the most common sleep disorders. Patients will also be introduced to the concept of "sleep hygiene," and discover ways to self-manage certain sleeping problems through simple daily behavior changes. Finally, the workshop will motivate patients by clarifying the links between quality sleep and their goals of heart-healthy living.   Recognizing and Reducing Stress Clinical staff led group instruction and group discussion with PowerPoint presentation and patient guidebook. To enhance the learning environment the use of posters, models and videos may be added. At the conclusion of this workshop, patients will be able to understand the types of stress reactions, differentiate between acute and chronic stress, and recognize the impact that chronic stress has on their health. They will also be able to apply different coping mechanisms, such as reframing negative self-talk. Patients will have the opportunity to practice a variety of stress management techniques, such as deep abdominal breathing, progressive muscle relaxation, and/or guided imagery.  The purpose of this lesson is to educate patients on the role of stress in their lives and to provide healthy techniques for coping with it.  Learning Barriers/Preferences:  Learning Barriers/Preferences - 09/19/22 1433       Learning Barriers/Preferences    Learning Barriers Sight   wears glasses   Learning Preferences Verbal Instruction;Computer/Internet;Audio;Written Material             Education Topics:  Knowledge Questionnaire Score:  Knowledge Questionnaire Score - 09/19/22 1433       Knowledge Questionnaire Score   Pre Score 20/24             Core Components/Risk Factors/Patient Goals at Admission:  Personal Goals and Risk Factors at Admission - 09/19/22 1434       Core Components/Risk Factors/Patient Goals on Admission    Weight Management Weight Gain             Core Components/Risk Factors/Patient Goals Review:    Core Components/Risk Factors/Patient Goals at Discharge (Final Review):    ITP Comments:  ITP Comments     Row Name 09/19/22 1121           ITP Comments Dr. Fransico Him medical director. Introduction to pritikin education program/ intensive cardiac rehab. Initial orientation packet reviewed with patient.                Comments: Participant attended orientation for the cardiac rehabilitation program on  09/19/2022  to perform initial intake and exercise walk test. Patient introduced to the [place]  education and orientation packet was reviewed. Completed 6-minute walk test, measurements, initial ITP, and exercise prescription. Vital signs stable. Telemetry-normal sinus rhythm, asymptomatic.   Service time was from 10:10 to 12:19.

## 2022-09-23 ENCOUNTER — Encounter (HOSPITAL_COMMUNITY)
Admission: RE | Admit: 2022-09-23 | Discharge: 2022-09-23 | Disposition: A | Discharge status: Home / Self Care | Payer: Medicare Other | Source: Ambulatory Visit | Attending: Cardiology | Admitting: Cardiology

## 2022-09-23 DIAGNOSIS — Z954 Presence of other heart-valve replacement: Secondary | ICD-10-CM

## 2022-09-23 DIAGNOSIS — Z952 Presence of prosthetic heart valve: Secondary | ICD-10-CM

## 2022-09-23 NOTE — Progress Notes (Signed)
Daily Session Note  Patient Details  Name: Vanessa Waller MRN: [id number] Date of Birth: 06-Dec-1955 Referring Provider:   Flowsheet Row INTENSIVE CARDIAC REHAB ORIENT from 09/19/2022 in [facility]  Referring Provider Dr. Mina Marble Fransico Him, MD covering)       Encounter Date: 09/23/2022  Check In:  Session Check In - 09/23/22 1256       Check-In   Supervising physician immediately available to respond to emergencies [facility] - Physician supervision    Physician(s) Ambrose Pancoast NP    Location [facility]    Staff Present Esmeralda Links BS, ACSM-CEP, Exercise Physiologist;Octave Montrose, RN, Milus Glazier, MS, ACSM-CEP, CCRP, Exercise Physiologist;Bailey Pearline Cables, MS, Exercise Physiologist;Johnny Starleen Blue, MS, Exercise Physiologist    Virtual Visit No    Medication changes reported     No    Fall or balance concerns reported    No    Tobacco Cessation No Change    Warm-up and Cool-down Performed as group-led instruction   CRP2 orientation   Resistance Training Performed Yes    VAD Patient? No    PAD/SET Patient? No      Pain Assessment   Currently in Pain? No/denies    Pain Score 0-No pain    Multiple Pain Sites No             Capillary Blood Glucose: No results found for this or any previous visit (from the past 24 hour(s)).   Exercise Prescription Changes - 09/23/22 1400       Response to Exercise   Blood Pressure (Admit) 108/72    Blood Pressure (Exercise) 130/60    Blood Pressure (Exit) 116/70    Heart Rate (Admit) 82 bpm    Heart Rate (Exercise) 87 bpm    Heart Rate (Exit) 78 bpm    Rating of Perceived Exertion (Exercise) 14    Symptoms None    Comments Pt's first day in the CRP2 program    Duration Continue with 30 min of aerobic exercise without signs/symptoms of physical distress.    Intensity THRR unchanged      Progression   Progression Continue to progress workloads to maintain  intensity without signs/symptoms of physical distress.    Average METs 1.7      Resistance Training   Training Prescription Yes    Weight 1 lb    Reps 10-15    Time 10 Minutes      Recumbant Bike   Level 1.5    RPM 34    Watts 8    Minutes 15    METs 2.1      NuStep   SPM 52    Minutes 15    METs 1.3             Social History   Tobacco Use  Smoking Status Never  Smokeless Tobacco Never    Goals Met:  Exercise tolerated well No report of concerns or symptoms today Strength training completed today  Goals Unmet:  Not Applicable  Comments: Pt started cardiac rehab today.  Pt tolerated light exercise without difficulty. VSS, telemetry-Sinus Rhythm , asymptomatic.  Medication list reconciled. Pt denies barriers to medicaiton compliance.  PSYCHOSOCIAL ASSESSMENT:  PHQ-0. Pt exhibits positive coping skills, hopeful outlook with supportive family. No psychosocial needs identified at this time, no psychosocial interventions necessary.    Pt enjoys reading.   Pt oriented to exercise equipment and routine.    Understanding verbalized.  Harrell Gave RN BSN    Dr. Fransico Him is Medical Director for Cardiac Rehab at [facility].

## 2022-09-25 ENCOUNTER — Encounter (HOSPITAL_COMMUNITY)
Admission: RE | Admit: 2022-09-25 | Discharge: 2022-09-25 | Disposition: A | Discharge status: Home / Self Care | Payer: Medicare Other | Source: Ambulatory Visit | Attending: Cardiology | Admitting: Cardiology

## 2022-09-25 DIAGNOSIS — Z952 Presence of prosthetic heart valve: Secondary | ICD-10-CM

## 2022-09-25 DIAGNOSIS — Z954 Presence of other heart-valve replacement: Secondary | ICD-10-CM

## 2022-09-27 ENCOUNTER — Encounter (HOSPITAL_COMMUNITY)
Admission: RE | Admit: 2022-09-27 | Discharge: 2022-09-27 | Disposition: A | Discharge status: Home / Self Care | Payer: Medicare Other | Source: Ambulatory Visit | Attending: Cardiology | Admitting: Cardiology

## 2022-09-27 DIAGNOSIS — Z954 Presence of other heart-valve replacement: Secondary | ICD-10-CM

## 2022-09-27 DIAGNOSIS — Z952 Presence of prosthetic heart valve: Secondary | ICD-10-CM | POA: Diagnosis not present

## 2022-09-30 ENCOUNTER — Encounter (HOSPITAL_COMMUNITY)
Admission: RE | Admit: 2022-09-30 | Discharge: 2022-09-30 | Disposition: A | Discharge status: Home / Self Care | Payer: Medicare Other | Source: Ambulatory Visit | Attending: Cardiology | Admitting: Cardiology

## 2022-09-30 DIAGNOSIS — Z952 Presence of prosthetic heart valve: Secondary | ICD-10-CM

## 2022-09-30 DIAGNOSIS — Z954 Presence of other heart-valve replacement: Secondary | ICD-10-CM

## 2022-10-02 ENCOUNTER — Encounter (HOSPITAL_COMMUNITY)
Admission: RE | Admit: 2022-10-02 | Discharge: 2022-10-02 | Disposition: A | Discharge status: Home / Self Care | Payer: Medicare Other | Source: Ambulatory Visit | Attending: Cardiology | Admitting: Cardiology

## 2022-10-02 DIAGNOSIS — Z954 Presence of other heart-valve replacement: Secondary | ICD-10-CM

## 2022-10-02 DIAGNOSIS — Z952 Presence of prosthetic heart valve: Secondary | ICD-10-CM | POA: Diagnosis not present

## 2022-10-02 NOTE — Progress Notes (Signed)
Cardiac Individual Treatment Plan  Patient Details  Name: Vanessa Waller MRN: [id number] Date of Birth: 1955/08/02 Referring Provider:   Flowsheet Row INTENSIVE CARDIAC REHAB ORIENT from 09/19/2022 in [facility]  Referring Provider Dr. Mina Marble (Fransico Him, MD covering)       Initial Encounter Date:  [place] from 09/19/2022 in [facility]  Date 09/19/22       Visit Diagnosis: 06/27/22 S/P pulmonary valve replacement  06/27/22 S/P tricuspid valve replacement, S/P aortic valve replacement  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,mesentery,pelvis   Nonspecific elevation of levels of transaminase or lactic acid dehydrogenase (LDH)    Small bowel obstruction ([place]) 2007   Uterine fibroid     Tobacco Use: Social History   Tobacco Use  Smoking Status Never  Smokeless Tobacco Never    Labs: Review Flowsheet       Latest Ref Rng & Units 11/30/2012 07/11/2015 01/16/2018  Labs for ITP Cardiac and Pulmonary Rehab  Cholestrol 125 - 200 mg/dL 171  160  -  LDL (calc) <130 mg/dL 82  88  -  HDL-C >=46 mg/dL 50  48  -  Trlycerides <150 mg/dL 196  120  -  TCO2 22 - 32 mmol/L - - 28     Capillary Blood Glucose: Lab Results  Component Value Date   GLUCAP 141 (H) 07/03/2012    GLUCAP 94 07/01/2012     Exercise Target Goals: Exercise Program Goal: Individual exercise prescription set using results from initial 6 min walk test and THRR while considering  patient's activity barriers and safety.   Exercise Prescription Goal: Initial exercise prescription builds to 30-45 minutes a day of aerobic activity, 2-3 days per week.  Home exercise guidelines will be given to patient during program as part of exercise prescription that the participant will acknowledge.  Activity Barriers & Risk Stratification:  Activity Barriers & Cardiac Risk Stratification - 09/19/22 1440       Activity Barriers & Cardiac Risk Stratification   Activity Barriers Deconditioning;Muscular Weakness;Shortness of Breath;Other (comment)    Comments Sternal Precautions    Cardiac Risk Stratification High             6 Minute Walk:  6 Minute Walk     Row Name 09/19/22 1134         6 Minute Walk   Phase Initial     Distance 1369 feet     Walk Time 6 minutes     # of Rest Breaks 0     MPH 2.6     METS 3.63     RPE 11     Perceived Dyspnea  1     VO2 Peak 12.72     Symptoms Yes (comment)  Comments SOB, RPD = 1     Resting HR 79 bpm     Resting BP 120/80     Resting Oxygen Saturation  100 %     Exercise Oxygen Saturation  during 6 min walk 99 %     Max Ex. HR 101 bpm     Max Ex. BP 128/70     2 Minute Post BP 122/78              Oxygen Initial Assessment:   Oxygen Re-Evaluation:   Oxygen Discharge (Final Oxygen Re-Evaluation):   Initial Exercise Prescription:  Initial Exercise Prescription - 09/19/22 1400       Date of Initial Exercise RX and Referring Provider   Date 09/19/22    Referring Provider Dr. Mina Marble Fransico Him, MD covering)    Expected Discharge Date 11/29/22      Recumbant Bike   Level 1.5    RPM 60    Watts 25    Minutes 15    METs 3.6      NuStep   Level 2    SPM 75    Minutes 15    METs 3.6      Prescription Details    Frequency (times per week) 3    Duration Progress to 30 minutes of continuous aerobic without signs/symptoms of physical distress      Intensity   THRR 40-80% of Max Heartrate 62 -124    Ratings of Perceived Exertion 11-13    Perceived Dyspnea 0-4      Progression   Progression Continue progressive overload as per policy without signs/symptoms or physical distress.      Resistance Training   Training Prescription Yes    Weight 2 lbs    Reps 10-15             Perform Capillary Blood Glucose checks as needed.  Exercise Prescription Changes:   Exercise Prescription Changes     Row Name 09/23/22 1400             Response to Exercise   Blood Pressure (Admit) 108/72       Blood Pressure (Exercise) 130/60       Blood Pressure (Exit) 116/70       Heart Rate (Admit) 82 bpm       Heart Rate (Exercise) 87 bpm       Heart Rate (Exit) 78 bpm       Rating of Perceived Exertion (Exercise) 14       Symptoms None       Comments Pt's first day in the CRP2 program       Duration Continue with 30 min of aerobic exercise without signs/symptoms of physical distress.       Intensity THRR unchanged         Progression   Progression Continue to progress workloads to maintain intensity without signs/symptoms of physical distress.       Average METs 1.7         Resistance Training   Training Prescription Yes       Weight 1 lb       Reps 10-15       Time 10 Minutes         Recumbant Bike   Level 1.5       RPM 34       Watts 8       Minutes 15       METs 2.1  NuStep   SPM 52       Minutes 15       METs 1.3                Exercise Comments:   Exercise Comments     Row Name 09/23/22 1414           Exercise Comments Pt's first day in the CRP2 program. Pt's RPEs were 13-14. will continue to make adjustments as necessary to pt workout.                Exercise Goals and Review:   Exercise Goals     Row Name 09/19/22 1441             Exercise  Goals   Increase Physical Activity Yes       Intervention Provide advice, education, support and counseling about physical activity/exercise needs.;Develop an individualized exercise prescription for aerobic and resistive training based on initial evaluation findings, risk stratification, comorbidities and participant's personal goals.       Expected Outcomes Short Term: Attend rehab on a regular basis to increase amount of physical activity.;Long Term: Add in home exercise to make exercise part of routine and to increase amount of physical activity.;Long Term: Exercising regularly at least 3-5 days a week.       Increase Strength and Stamina Yes       Intervention Provide advice, education, support and counseling about physical activity/exercise needs.;Develop an individualized exercise prescription for aerobic and resistive training based on initial evaluation findings, risk stratification, comorbidities and participant's personal goals.       Expected Outcomes Short Term: Increase workloads from initial exercise prescription for resistance, speed, and METs.;Short Term: Perform resistance training exercises routinely during rehab and add in resistance training at home;Long Term: Improve cardiorespiratory fitness, muscular endurance and strength as measured by increased METs and functional capacity (6MWT)       Able to understand and use rate of perceived exertion (RPE) scale Yes       Intervention Provide education and explanation on how to use RPE scale       Expected Outcomes Short Term: Able to use RPE daily in rehab to express subjective intensity level;Long Term:  Able to use RPE to guide intensity level when exercising independently       Knowledge and understanding of Target Heart Rate Range (THRR) Yes       Intervention Provide education and explanation of THRR including how the numbers were predicted and where they are located for reference       Expected Outcomes Short Term: Able to  state/look up THRR;Short Term: Able to use daily as guideline for intensity in rehab;Long Term: Able to use THRR to govern intensity when exercising independently       Understanding of Exercise Prescription Yes       Intervention Provide education, explanation, and written materials on patient's individual exercise prescription       Expected Outcomes Short Term: Able to explain program exercise prescription;Long Term: Able to explain home exercise prescription to exercise independently                Exercise Goals Re-Evaluation :  Exercise Goals Re-Evaluation     Row Name 09/23/22 1412             Exercise Goal Re-Evaluation   Exercise Goals Review Increase Physical Activity;Increase Strength and Stamina;Able to understand and use rate of perceived exertion (RPE) scale;Knowledge and understanding of Target  Heart Rate Range (THRR);Understanding of Exercise Prescription       Comments Pt's first day in the CRP2 program. Pt understands the exercise Rx, THRR, RPE scale.       Expected Outcomes Will continue to monitor the patient and progress exercise workloads as tolerated.                Discharge Exercise Prescription (Final Exercise Prescription Changes):  Exercise Prescription Changes - 09/23/22 1400       Response to Exercise   Blood Pressure (Admit) 108/72    Blood Pressure (Exercise) 130/60    Blood Pressure (Exit) 116/70    Heart Rate (Admit) 82 bpm    Heart Rate (Exercise) 87 bpm    Heart Rate (Exit) 78 bpm    Rating of Perceived Exertion (Exercise) 14    Symptoms None    Comments Pt's first day in the CRP2 program    Duration Continue with 30 min of aerobic exercise without signs/symptoms of physical distress.    Intensity THRR unchanged      Progression   Progression Continue to progress workloads to maintain intensity without signs/symptoms of physical distress.    Average METs 1.7      Resistance Training   Training Prescription Yes    Weight 1 lb     Reps 10-15    Time 10 Minutes      Recumbant Bike   Level 1.5    RPM 34    Watts 8    Minutes 15    METs 2.1      NuStep   SPM 52    Minutes 15    METs 1.3             Nutrition:  Target Goals: Understanding of nutrition guidelines, daily intake of sodium 1500mg , cholesterol 200mg , calories 30% from fat and 7% or less from saturated fats, daily to have 5 or more servings of fruits and vegetables.  Biometrics:  Pre Biometrics - 09/19/22 1045       Pre Biometrics   Waist Circumference 29.5 inches    Hip Circumference 35.5 inches    Waist to Hip Ratio 0.83 %    Triceps Skinfold 10 mm    % Body Fat 27.2 %    Grip Strength 22 kg    Flexibility 0 in   could not reach box   Single Leg Stand 30 seconds              Nutrition Therapy Plan and Nutrition Goals:  Nutrition Therapy & Goals - 09/23/22 1334       Nutrition Therapy   Diet General Healthful Diet    Protein (specify units) 62-77g/day (1.2-1.5g/kg IBW)      Personal Nutrition Goals   Nutrition Goal Patient to identify strategies for weight gain of 0.5-2.0# per week.    Personal Goal #2 Patient to improve diet quality by using the plate method as a guide for meal planning to include lean protein/plant protein, fruits, vegetables, whole grains, nonfat dairy as part of a well-balanced diet.    Comments Lilliona is motivated to gain weight. She reports her lowest weight was [id number] and she is uncertain of a goal weight at this time. She is living with her sister while she is recovering from surgery. She reports normal appetite, eating three meals daily, and using Equate Nutritional Shake Plus on occasion (350kcals, 13g protein). Encouraged patient to increased Equate Nutritional Shake Plus to twice daily (700kcals, 26g protein) in  additon to three meals. Maretta will continue to benefit from participation in intensive cardiac rehab for nutrition, exercise, and lifestyle modification.      Intervention Plan    Intervention Prescribe, educate and counsel regarding individualized specific dietary modifications aiming towards targeted core components such as weight, hypertension, lipid management, diabetes, heart failure and other comorbidities.;Nutrition handout(s) given to patient.    Expected Outcomes Short Term Goal: Understand basic principles of dietary content, such as calories, fat, sodium, cholesterol and nutrients.;Long Term Goal: Adherence to prescribed nutrition plan.             Nutrition Assessments:  Nutrition Assessments - 09/23/22 1547       Rate Your Plate Scores   Pre Score 49            MEDIFICTS Score Key: ?70 Need to make dietary changes  40-70 Heart Healthy Diet ? 40 Therapeutic Level Cholesterol Diet   Flowsheet Row INTENSIVE CARDIAC REHAB from 09/23/2022 in [facility]  Picture Your Plate Total Score on Admission 49      Picture Your Plate Scores: [id number] Unhealthy dietary pattern with much room for improvement. 41-50 Dietary pattern unlikely to meet recommendations for good health and room for improvement. 51-60 More healthful dietary pattern, with some room for improvement.  >60 Healthy dietary pattern, although there may be some specific behaviors that could be improved.    Nutrition Goals Re-Evaluation:  Nutrition Goals Re-Evaluation     [place] Name 09/23/22 1334             Goals   Current Weight 103 lb 2.8 oz (46.8 kg)       Comment IBW 51.25kg, current BMI 18.5, 1404-1638kcals/day (30-35kcals/kg actual body weight)       Expected Outcome Cher is motivated to gain weight. She reports her lowest weight was 123456 and she is uncertain of a goal weight at this time. She is living with her sister while she is recovering from surgery. She reports normal appetite, eating three meals daily, and using Equate Nutritional Shake Plus on occasion (350kcals, 13g protein). Encouraged patient to increased  Equate Nutritional Shake Plus to twice daily (700kcals, 26g protein) in additon to three meals. Flo will continue to benefit from participation in intensive cardiac rehab for nutrition, exercise, and lifestyle modification.                Nutrition Goals Re-Evaluation:  Nutrition Goals Re-Evaluation     [place] Name 09/23/22 1334             Goals   Current Weight 103 lb 2.8 oz (46.8 kg)       Comment IBW 51.25kg, current BMI 18.5, 1404-1638kcals/day (30-35kcals/kg actual body weight)       Expected Outcome Johnsie is motivated to gain weight. She reports her lowest weight was 123456 and she is uncertain of a goal weight at this time. She is living with her sister while she is recovering from surgery. She reports normal appetite, eating three meals daily, and using Equate Nutritional Shake Plus on occasion (350kcals, 13g protein). Encouraged patient to increased Equate Nutritional Shake Plus to twice daily (700kcals, 26g protein) in additon to three meals. Michaeleen will continue to benefit from participation in intensive cardiac rehab for nutrition, exercise, and lifestyle modification.                Nutrition Goals Discharge (Final Nutrition Goals Re-Evaluation):  Nutrition Goals Re-Evaluation - 09/23/22  1334       Goals   Current Weight 103 lb 2.8 oz (46.8 kg)    Comment IBW 51.25kg, current BMI 18.5, 1404-1638kcals/day (30-35kcals/kg actual body weight)    Expected Outcome Hideko is motivated to gain weight. She reports her lowest weight was 123456 and she is uncertain of a goal weight at this time. She is living with her sister while she is recovering from surgery. She reports normal appetite, eating three meals daily, and using Equate Nutritional Shake Plus on occasion (350kcals, 13g protein). Encouraged patient to increased Equate Nutritional Shake Plus to twice daily (700kcals, 26g protein) in additon to three meals. Ricardo will continue to benefit from participation in intensive  cardiac rehab for nutrition, exercise, and lifestyle modification.             Psychosocial: Target Goals: Acknowledge presence or absence of significant depression and/or stress, maximize coping skills, provide positive support system. Participant is able to verbalize types and ability to use techniques and skills needed for reducing stress and depression.  Initial Review & Psychosocial Screening:  Initial Psych Review & Screening - 09/19/22 1428       Initial Review   Current issues with None Identified      Family Dynamics   Good Support System? Yes    Comments Pt has her sister and currently lives with her s/p surgery. Pt also voices having other family members for support.      Barriers   Psychosocial barriers to participate in program There are no identifiable barriers or psychosocial needs.      Screening Interventions   Interventions Encouraged to exercise             Quality of Life Scores:  Quality of Life - 09/19/22 1432       Quality of Life   Select Quality of Life      Quality of Life Scores   Health/Function Pre 20.43 %    Socioeconomic Pre 20.36 %    Psych/Spiritual Pre 22.5 %    Family Pre 19.17 %    GLOBAL Pre 20.69 %            Scores of 19 and below usually indicate a poorer quality of life in these areas.  A difference of  2-3 points is a clinically meaningful difference.  A difference of 2-3 points in the total score of the Quality of Life Index has been associated with significant improvement in overall quality of life, self-image, physical symptoms, and general health in studies assessing change in quality of life.  PHQ-9: Review Flowsheet       09/19/2022  Depression screen PHQ 2/9  Decreased Interest 0  Down, Depressed, Hopeless 0  PHQ - 2 Score 0  Altered sleeping 0  Tired, decreased energy 0  Change in appetite 0  Feeling bad or failure about yourself  0  Trouble concentrating 0  Moving slowly or fidgety/restless 0   Suicidal thoughts 0  PHQ-9 Score 0  Difficult doing work/chores Not difficult at all   Interpretation of Total Score  Total Score Depression Severity:  1-4 = Minimal depression, 5-9 = Mild depression, 10-14 = Moderate depression, 15-19 = Moderately severe depression, 20-27 = Severe depression   Psychosocial Evaluation and Intervention:   Psychosocial Re-Evaluation:  Psychosocial Re-Evaluation     [place] Name 09/23/22 1625 10/02/22 1204           Psychosocial Re-Evaluation   Current issues with None Identified None Identified  Comments -- Marae recently moved back home and is driving now      Interventions Encouraged to attend Cardiac Rehabilitation for the exercise Encouraged to attend Cardiac Rehabilitation for the exercise      Continue Psychosocial Services  No Follow up required No Follow up required               Psychosocial Discharge (Final Psychosocial Re-Evaluation):  Psychosocial Re-Evaluation - 10/02/22 1204       Psychosocial Re-Evaluation   Current issues with None Identified    Comments Jesselyn recently moved back home and is driving now    Interventions Encouraged to attend Cardiac Rehabilitation for the exercise    Continue Psychosocial Services  No Follow up required             Vocational Rehabilitation: Provide vocational rehab assistance to qualifying candidates.   Vocational Rehab Evaluation & Intervention:  Vocational Rehab - 09/19/22 1433       Initial Vocational Rehab Evaluation & Intervention   Assessment shows need for Vocational Rehabilitation No      Vocational Rehab Re-Evaulation   Comments Pt is retired and has no needs at this time             Education: Education Goals: Education classes will be provided on a weekly basis, covering required topics. Participant will state understanding/return demonstration of topics presented.    Education     Row Name 09/23/22 1400     Education   Cardiac Education Topics  Pritikin   Environmental consultant Exercise   Exercise Workshop Hotel manager and Fall Prevention   Instruction Review Code 1- Verbalizes Understanding   Class Start Time 1145   Class Stop Time 1230   Class Time Calculation (min) 45 min    [place] Name 09/25/22 1300     Education   Cardiac Education Topics Pritikin   Financial trader   Weekly Topic Fast and Healthy Breakfasts   Instruction Review Code 1- Verbalizes Understanding   Class Start Time 1145   Class Stop Time 1226   Class Time Calculation (min) 41 min    [place] Name 09/27/22 1200     Education   Cardiac Education Topics Pritikin   Lexicographer Nutrition   Nutrition Overview of the Pritikin Eating Plan   Instruction Review Code 1- Verbalizes Understanding   Class Start Time 1150   Class Stop Time 1230   Class Time Calculation (min) 40 min    [place] Name 09/30/22 1700     Education   Cardiac Education Topics Pritikin   [employer]     Workshops   Educator Exercise Physiologist   Select Psychosocial   Psychosocial Workshop Recognizing and Reducing Stress   Instruction Review Code 1- Verbalizes Understanding   Class Start Time 1405   Class Stop Time 1454   Class Time Calculation (min) 49 min            Core Videos: Exercise    Move It!  Clinical staff conducted group or individual video education with verbal and written material and guidebook.  Patient learns the recommended Pritikin exercise program. Exercise with the goal of living a long, healthy life. Some of the health benefits of exercise include controlled diabetes, healthier blood pressure levels, improved cholesterol  levels, improved heart and lung capacity, improved sleep, and better body composition. Everyone should speak with their doctor before starting or changing an exercise  routine.  Biomechanical Limitations Clinical staff conducted group or individual video education with verbal and written material and guidebook.  Patient learns how biomechanical limitations can impact exercise and how we can mitigate and possibly overcome limitations to have an impactful and balanced exercise routine.  Body Composition Clinical staff conducted group or individual video education with verbal and written material and guidebook.  Patient learns that body composition (ratio of muscle mass to fat mass) is a key component to assessing overall fitness, rather than body weight alone. Increased fat mass, especially visceral belly fat, can put [place] at increased risk for metabolic syndrome, type 2 diabetes, heart disease, and even death. It is recommended to combine diet and exercise (cardiovascular and resistance training) to improve your body composition. Seek guidance from your physician and exercise physiologist before implementing an exercise routine.  Exercise Action Plan Clinical staff conducted group or individual video education with verbal and written material and guidebook.  Patient learns the recommended strategies to achieve and enjoy long-term exercise adherence, including variety, self-motivation, self-efficacy, and positive decision making. Benefits of exercise include fitness, good health, weight management, more energy, better sleep, less stress, and overall well-being.  Medical   Heart Disease Risk Reduction Clinical staff conducted group or individual video education with verbal and written material and guidebook.  Patient learns our heart is our most vital organ as it circulates oxygen, nutrients, white blood cells, and hormones throughout the entire body, and carries waste away. Data supports a plant-based eating plan like the Pritikin Program for its effectiveness in slowing progression of and reversing heart disease. The video provides a number of recommendations to  address heart disease.   Metabolic Syndrome and Belly Fat  Clinical staff conducted group or individual video education with verbal and written material and guidebook.  Patient learns what metabolic syndrome is, how it leads to heart disease, and how one can reverse it and keep it from coming back. You have metabolic syndrome if you have 3 of the following 5 criteria: abdominal obesity, high blood pressure, high triglycerides, low HDL cholesterol, and high blood sugar.  Hypertension and Heart Disease Clinical staff conducted group or individual video education with verbal and written material and guidebook.  Patient learns that high blood pressure, or hypertension, is very common in the [place]. Hypertension is largely due to excessive salt intake, but other important risk factors include being overweight, physical inactivity, drinking too much alcohol, smoking, and not eating enough potassium from fruits and vegetables. High blood pressure is a leading risk factor for heart attack, stroke, congestive heart failure, dementia, kidney failure, and premature death. Long-term effects of excessive salt intake include stiffening of the arteries and thickening of heart muscle and organ damage. Recommendations include ways to reduce hypertension and the risk of heart disease.  Diseases of Our Time - Focusing on Diabetes Clinical staff conducted group or individual video education with verbal and written material and guidebook.  Patient learns why the best way to stop diseases of our time is prevention, through food and other lifestyle changes. Medicine (such as prescription pills and surgeries) is often only a Band-Aid on the problem, not a long-term solution. Most common diseases of our time include obesity, type 2 diabetes, hypertension, heart disease, and cancer. The Pritikin Program is recommended and has been proven to help reduce, reverse, and/or prevent  the damaging effects of metabolic  syndrome.  Nutrition   Overview of the Pritikin Eating Plan  Clinical staff conducted group or individual video education with verbal and written material and guidebook.  Patient learns about the [place] for disease risk reduction. The [place] emphasizes a wide variety of unrefined, minimally-processed carbohydrates, like fruits, vegetables, whole grains, and legumes. Go, Caution, and Stop food choices are explained. Plant-based and lean animal proteins are emphasized. Rationale provided for low sodium intake for blood pressure control, low added sugars for blood sugar stabilization, and low added fats and oils for coronary artery disease risk reduction and weight management.  Calorie Density  Clinical staff conducted group or individual video education with verbal and written material and guidebook.  Patient learns about calorie density and how it impacts the Pritikin Eating Plan. Knowing the characteristics of the food you choose will help you decide whether those foods will lead to weight gain or weight loss, and whether you want to consume more or less of them. Weight loss is usually a side effect of the Pritikin Eating Plan because of its focus on low calorie-dense foods.  Label Reading  Clinical staff conducted group or individual video education with verbal and written material and guidebook.  Patient learns about the Pritikin recommended label reading guidelines and corresponding recommendations regarding calorie density, added sugars, sodium content, and whole grains.  Dining Out - Part 1  Clinical staff conducted group or individual video education with verbal and written material and guidebook.  Patient learns that restaurant meals can be sabotaging because they can be so high in calories, fat, sodium, and/or sugar. Patient learns recommended strategies on how to positively address this and avoid unhealthy pitfalls.  Facts on Fats  Clinical staff conducted  group or individual video education with verbal and written material and guidebook.  Patient learns that lifestyle modifications can be just as effective, if not more so, as many medications for lowering your risk of heart disease. A Pritikin lifestyle can help to reduce your risk of inflammation and atherosclerosis (cholesterol build-up, or plaque, in the artery walls). Lifestyle interventions such as dietary choices and physical activity address the cause of atherosclerosis. A review of the types of fats and their impact on blood cholesterol levels, along with dietary recommendations to reduce fat intake is also included.  Nutrition Action Plan  Clinical staff conducted group or individual video education with verbal and written material and guidebook.  Patient learns how to incorporate Pritikin recommendations into their lifestyle. Recommendations include planning and keeping personal health goals in mind as an important part of their success.  Healthy Mind-Set    Healthy Minds, Bodies, Hearts  Clinical staff conducted group or individual video education with verbal and written material and guidebook.  Patient learns how to identify when they are stressed. Video will discuss the impact of that stress, as well as the many benefits of stress management. Patient will also be introduced to stress management techniques. The way we think, act, and feel has an impact on our hearts.  How Our Thoughts Can Heal Our Hearts  Clinical staff conducted group or individual video education with verbal and written material and guidebook.  Patient learns that negative thoughts can cause depression and anxiety. This can result in negative lifestyle behavior and serious health problems. Cognitive behavioral therapy is an effective method to help control our thoughts in order to change and improve our emotional outlook.  Additional Videos:  Exercise  Improving Performance  Clinical staff conducted group or  individual video education with verbal and written material and guidebook.  Patient learns to use a non-linear approach by alternating intensity levels and lengths of time spent exercising to help burn more calories and lose more body fat. Cardiovascular exercise helps improve heart health, metabolism, hormonal balance, blood sugar control, and recovery from fatigue. Resistance training improves strength, endurance, balance, coordination, reaction time, metabolism, and muscle mass. Flexibility exercise improves circulation, posture, and balance. Seek guidance from your physician and exercise physiologist before implementing an exercise routine and learn your capabilities and proper form for all exercise.  Introduction to Yoga  Clinical staff conducted group or individual video education with verbal and written material and guidebook.  Patient learns about yoga, a discipline of the coming together of mind, breath, and body. The benefits of yoga include improved flexibility, improved range of motion, better posture and core strength, increased lung function, weight loss, and positive self-image. Yoga's heart health benefits include lowered blood pressure, healthier heart rate, decreased cholesterol and triglyceride levels, improved immune function, and reduced stress. Seek guidance from your physician and exercise physiologist before implementing an exercise routine and learn your capabilities and proper form for all exercise.  Medical   Aging: Enhancing Your Quality of Life  Clinical staff conducted group or individual video education with verbal and written material and guidebook.  Patient learns key strategies and recommendations to stay in good physical health and enhance quality of life, such as prevention strategies, having an advocate, securing a [place], and keeping a list of medications and system for tracking them. It also discusses how to avoid risk for bone  loss.  Biology of Weight Control  Clinical staff conducted group or individual video education with verbal and written material and guidebook.  Patient learns that weight gain occurs because we consume more calories than we burn (eating more, moving less). Even if your body weight is normal, you may have higher ratios of fat compared to muscle mass. Too much body fat puts you at increased risk for cardiovascular disease, heart attack, stroke, type 2 diabetes, and obesity-related cancers. In addition to exercise, following the [place] can help reduce your risk.  Decoding Lab Results  Clinical staff conducted group or individual video education with verbal and written material and guidebook.  Patient learns that lab test reflects one measurement whose values change over time and are influenced by many factors, including medication, stress, sleep, exercise, food, hydration, pre-existing medical conditions, and more. It is recommended to use the knowledge from this video to become more involved with your lab results and evaluate your numbers to speak with your doctor.   Diseases of Our Time - Overview  Clinical staff conducted group or individual video education with verbal and written material and guidebook.  Patient learns that according to the CDC, 50% to 70% of chronic diseases (such as obesity, type 2 diabetes, elevated lipids, hypertension, and heart disease) are avoidable through lifestyle improvements including healthier food choices, listening to satiety cues, and increased physical activity.  Sleep Disorders Clinical staff conducted group or individual video education with verbal and written material and guidebook.  Patient learns how good quality and duration of sleep are important to overall health and well-being. Patient also learns about sleep disorders and how they impact health along with recommendations to address them, including discussing with a physician.  Nutrition   Dining Out - Part 2 Clinical staff  conducted group or individual video education with verbal and written material and guidebook.  Patient learns how to plan ahead and communicate in order to maximize their dining experience in a healthy and nutritious manner. Included are recommended food choices based on the type of restaurant the patient is visiting.   Fueling a Best boy conducted group or individual video education with verbal and written material and guidebook.  There is a strong connection between our food choices and our health. Diseases like obesity and type 2 diabetes are very prevalent and are in large-part due to lifestyle choices. The Pritikin Eating Plan provides plenty of food and hunger-curbing satisfaction. It is easy to follow, affordable, and helps reduce health risks.  Menu Workshop  Clinical staff conducted group or individual video education with verbal and written material and guidebook.  Patient learns that restaurant meals can sabotage health goals because they are often packed with calories, fat, sodium, and sugar. Recommendations include strategies to plan ahead and to communicate with the manager, chef, or server to help order a healthier meal.  Planning Your Eating Strategy  Clinical staff conducted group or individual video education with verbal and written material and guidebook.  Patient learns about the [place] and its benefit of reducing the risk of disease. The [place] does not focus on calories. Instead, it emphasizes high-quality, nutrient-rich foods. By knowing the characteristics of the foods, we choose, we can determine their calorie density and make informed decisions.  Targeting Your Nutrition Priorities  Clinical staff conducted group or individual video education with verbal and written material and guidebook.  Patient learns that lifestyle habits have a tremendous impact on disease risk and progression. This  video provides eating and physical activity recommendations based on your personal health goals, such as reducing LDL cholesterol, losing weight, preventing or controlling type 2 diabetes, and reducing high blood pressure.  Vitamins and Minerals  Clinical staff conducted group or individual video education with verbal and written material and guidebook.  Patient learns different ways to obtain key vitamins and minerals, including through a recommended healthy diet. It is important to discuss all supplements you take with your doctor.   Healthy Mind-Set    Smoking Cessation  Clinical staff conducted group or individual video education with verbal and written material and guidebook.  Patient learns that cigarette smoking and tobacco addiction pose a serious health risk which affects millions of people. Stopping smoking will significantly reduce the risk of heart disease, lung disease, and many forms of cancer. Recommended strategies for quitting are covered, including working with your doctor to develop a successful plan.  Culinary   Becoming a Financial trader conducted group or individual video education with verbal and written material and guidebook.  Patient learns that cooking at home can be healthy, cost-effective, quick, and puts them in control. Keys to cooking healthy recipes will include looking at your recipe, assessing your equipment needs, planning ahead, making it simple, choosing cost-effective seasonal ingredients, and limiting the use of added fats, salts, and sugars.  Cooking - Breakfast and Snacks  Clinical staff conducted group or individual video education with verbal and written material and guidebook.  Patient learns how important breakfast is to satiety and nutrition through the entire day. Recommendations include key foods to eat during breakfast to help stabilize blood sugar levels and to prevent overeating at meals later in the day. Planning ahead is also a  key component.  Cooking -  Dinner and [employer] staff conducted group or individual video education with verbal and written material and guidebook.  Patient learns eating strategies to improve overall health, including an approach to cook more at home. Recommendations include thinking of animal protein as a side on your plate rather than center stage and focusing instead on lower calorie dense options like vegetables, fruits, whole grains, and plant-based proteins, such as beans. Making sauces in large quantities to freeze for later and leaving the skin on your vegetables are also recommended to maximize your experience.  Cooking - Healthy Salads and Dressing Clinical staff conducted group or individual video education with verbal and written material and guidebook.  Patient learns that vegetables, fruits, whole grains, and legumes are the foundations of the [place]. Recommendations include how to incorporate each of these in flavorful and healthy salads, and how to create homemade salad dressings. Proper handling of ingredients is also covered. Cooking - Soups and [employer] - Soups and Desserts Clinical staff conducted group or individual video education with verbal and written material and guidebook.  Patient learns that Pritikin soups and desserts make for easy, nutritious, and delicious snacks and meal components that are low in sodium, fat, sugar, and calorie density, while high in vitamins, minerals, and filling fiber. Recommendations include simple and healthy ideas for soups and desserts.   Overview     The Pritikin Solution Program Overview Clinical staff conducted group or individual video education with verbal and written material and guidebook.  Patient learns that the results of the [place] Program have been documented in more than 100 articles published in peer-reviewed journals, and the benefits include reducing risk factors for (and, in some cases, even  reversing) high cholesterol, high blood pressure, type 2 diabetes, obesity, and more! An overview of the three key pillars of the Pritikin Program will be covered: eating well, doing regular exercise, and having a healthy mind-set.  WORKSHOPS  Exercise: Exercise Basics: Building Your Action Plan Clinical staff led group instruction and group discussion with PowerPoint presentation and patient guidebook. To enhance the learning environment the use of posters, models and videos may be added. At the conclusion of this workshop, patients will comprehend the difference between physical activity and exercise, as well as the benefits of incorporating both, into their routine. Patients will understand the FITT (Frequency, Intensity, Time, and Type) principle and how to use it to build an exercise action plan. In addition, safety concerns and other considerations for exercise and cardiac rehab will be addressed by the presenter. The purpose of this lesson is to promote a comprehensive and effective weekly exercise routine in order to improve patients' overall level of fitness.   Managing Heart Disease: Your Path to a Healthier Heart Clinical staff led group instruction and group discussion with PowerPoint presentation and patient guidebook. To enhance the learning environment the use of posters, models and videos may be added.At the conclusion of this workshop, patients will understand the anatomy and physiology of the heart. Additionally, they will understand how Pritikin's three pillars impact the risk factors, the progression, and the management of heart disease.  The purpose of this lesson is to provide a high-level overview of the heart, heart disease, and how the Pritikin lifestyle positively impacts risk factors.  Exercise Biomechanics Clinical staff led group instruction and group discussion with PowerPoint presentation and patient guidebook. To enhance the learning environment the use of  posters, models and videos may be added. Patients will learn how  the structural parts of their bodies function and how these functions impact their daily activities, movement, and exercise. Patients will learn how to promote a neutral spine, learn how to manage pain, and identify ways to improve their physical movement in order to promote healthy living. The purpose of this lesson is to expose patients to common physical limitations that impact physical activity. Participants will learn practical ways to adapt and manage aches and pains, and to minimize their effect on regular exercise. Patients will learn how to maintain good posture while sitting, walking, and lifting.  Balance Training and Fall Prevention  Clinical staff led group instruction and group discussion with PowerPoint presentation and patient guidebook. To enhance the learning environment the use of posters, models and videos may be added. At the conclusion of this workshop, patients will understand the importance of their sensorimotor skills (vision, proprioception, and the vestibular system) in maintaining their ability to balance as they age. Patients will apply a variety of balancing exercises that are appropriate for their current level of function. Patients will understand the common causes for poor balance, possible solutions to these problems, and ways to modify their physical environment in order to minimize their fall risk. The purpose of this lesson is to teach patients about the importance of maintaining balance as they age and ways to minimize their risk of falling.  WORKSHOPS   Nutrition:  Fueling a Scientist, research (physical sciences) led group instruction and group discussion with PowerPoint presentation and patient guidebook. To enhance the learning environment the use of posters, models and videos may be added. Patients will review the foundational principles of the [place] and understand what constitutes a  serving size in each of the food groups. Patients will also learn Pritikin-friendly foods that are better choices when away from home and review make-ahead meal and snack options. Calorie density will be reviewed and applied to three nutrition priorities: weight maintenance, weight loss, and weight gain. The purpose of this lesson is to reinforce (in a group setting) the key concepts around what patients are recommended to eat and how to apply these guidelines when away from home by planning and selecting Pritikin-friendly options. Patients will understand how calorie density may be adjusted for different weight management goals.  Mindful Eating  Clinical staff led group instruction and group discussion with PowerPoint presentation and patient guidebook. To enhance the learning environment the use of posters, models and videos may be added. Patients will briefly review the concepts of the [place] and the importance of low-calorie dense foods. The concept of mindful eating will be introduced as well as the importance of paying attention to internal hunger signals. Triggers for non-hunger eating and techniques for dealing with triggers will be explored. The purpose of this lesson is to provide patients with the opportunity to review the basic principles of the [place], discuss the value of eating mindfully and how to measure internal cues of hunger and fullness using the Hunger Scale. Patients will also discuss reasons for non-hunger eating and learn strategies to use for controlling emotional eating.  Targeting Your Nutrition Priorities Clinical staff led group instruction and group discussion with PowerPoint presentation and patient guidebook. To enhance the learning environment the use of posters, models and videos may be added. Patients will learn how to determine their genetic susceptibility to disease by reviewing their family history. Patients will gain insight into the  importance of diet as part of an overall healthy lifestyle in mitigating  the impact of genetics and other environmental insults. The purpose of this lesson is to provide patients with the opportunity to assess their personal nutrition priorities by looking at their family history, their own health history and current risk factors. Patients will also be able to discuss ways of prioritizing and modifying the [place] for their highest risk areas  Menu  Clinical staff led group instruction and group discussion with PowerPoint presentation and patient guidebook. To enhance the learning environment the use of posters, models and videos may be added. Using menus brought in from [employer], or printed from [employer], patients will apply the [place] dining out guidelines that were presented in the [employer] video. Patients will also be able to practice these guidelines in a variety of provided scenarios. The purpose of this lesson is to provide patients with the opportunity to practice hands-on learning of the [place] with actual menus and practice scenarios.  Label Reading Clinical staff led group instruction and group discussion with PowerPoint presentation and patient guidebook. To enhance the learning environment the use of posters, models and videos may be added. Patients will review and discuss the Pritikin label reading guidelines presented in Pritikin's Label Reading Educational series video. Using fool labels brought in from local grocery stores and markets, patients will apply the label reading guidelines and determine if the packaged food meet the Pritikin guidelines. The purpose of this lesson is to provide patients with the opportunity to review, discuss, and practice hands-on learning of the Pritikin Label Reading guidelines with actual packaged food labels. [place] Workshops are designed to teach  patients ways to prepare quick, simple, and affordable recipes at home. The importance of nutrition's role in chronic disease risk reduction is reflected in its emphasis in the overall Pritikin program. By learning how to prepare essential core Pritikin Eating Plan recipes, patients will increase control over what they eat; be able to customize the flavor of foods without the use of added salt, sugar, or fat; and improve the quality of the food they consume. By learning a set of core recipes which are easily assembled, quickly prepared, and affordable, patients are more likely to prepare more healthy foods at home. These workshops focus on convenient breakfasts, simple entres, side dishes, and desserts which can be prepared with minimal effort and are consistent with nutrition recommendations for cardiovascular risk reduction. Cooking [employer] are taught by a Engineer, materials (RD) who has been trained by the [employer]. The chef or RD has a clear understanding of the importance of minimizing - if not completely eliminating - added fat, sugar, and sodium in recipes. Throughout the series of [place] Workshop sessions, patients will learn about healthy ingredients and efficient methods of cooking to build confidence in their capability to prepare    Cooking School weekly topics:  Adding Flavor- Sodium-Free  Fast and Healthy Breakfasts  Powerhouse Plant-Based Proteins  Satisfying Salads and Dressings  Simple Sides and Sauces  International Cuisine-Spotlight on the [place] Zones  Delicious Desserts  Savory Soups  [employer] - Meals in a Agricultural consultant Appetizers and Snacks  Comforting Weekend Breakfasts  One-Pot Wonders   Fast Evening Meals  Contractor Your Pritikin Plate  WORKSHOPS   Healthy Mindset (Psychosocial):  Focused Goals, Sustainable Changes Clinical staff led group instruction and group discussion with PowerPoint  presentation and patient guidebook. To enhance the learning environment  the use of posters, models and videos may be added. Patients will be able to apply effective goal setting strategies to establish at least one personal goal, and then take consistent, meaningful action toward that goal. They will learn to identify common barriers to achieving personal goals and develop strategies to overcome them. Patients will also gain an understanding of how our mind-set can impact our ability to achieve goals and the importance of cultivating a positive and growth-oriented mind-set. The purpose of this lesson is to provide patients with a deeper understanding of how to set and achieve personal goals, as well as the tools and strategies needed to overcome common obstacles which may arise along the way.  From Head to Heart: The Power of a Healthy Outlook  Clinical staff led group instruction and group discussion with PowerPoint presentation and patient guidebook. To enhance the learning environment the use of posters, models and videos may be added. Patients will be able to recognize and describe the impact of emotions and mood on physical health. They will discover the importance of self-care and explore self-care practices which may work for them. Patients will also learn how to utilize the 4 C's to cultivate a healthier outlook and better manage stress and challenges. The purpose of this lesson is to demonstrate to patients how a healthy outlook is an essential part of maintaining good health, especially as they continue their cardiac rehab journey.  Healthy Sleep for a Healthy Heart Clinical staff led group instruction and group discussion with PowerPoint presentation and patient guidebook. To enhance the learning environment the use of posters, models and videos may be added. At the conclusion of this workshop, patients will be able to demonstrate knowledge of the importance of sleep to overall health, well-being,  and quality of life. They will understand the symptoms of, and treatments for, common sleep disorders. Patients will also be able to identify daytime and nighttime behaviors which impact sleep, and they will be able to apply these tools to help manage sleep-related challenges. The purpose of this lesson is to provide patients with a general overview of sleep and outline the importance of quality sleep. Patients will learn about a few of the most common sleep disorders. Patients will also be introduced to the concept of "sleep hygiene," and discover ways to self-manage certain sleeping problems through simple daily behavior changes. Finally, the workshop will motivate patients by clarifying the links between quality sleep and their goals of heart-healthy living.   Recognizing and Reducing Stress Clinical staff led group instruction and group discussion with PowerPoint presentation and patient guidebook. To enhance the learning environment the use of posters, models and videos may be added. At the conclusion of this workshop, patients will be able to understand the types of stress reactions, differentiate between acute and chronic stress, and recognize the impact that chronic stress has on their health. They will also be able to apply different coping mechanisms, such as reframing negative self-talk. Patients will have the opportunity to practice a variety of stress management techniques, such as deep abdominal breathing, progressive muscle relaxation, and/or guided imagery.  The purpose of this lesson is to educate patients on the role of stress in their lives and to provide healthy techniques for coping with it.  Learning Barriers/Preferences:  Learning Barriers/Preferences - 09/19/22 1433       Learning Barriers/Preferences   Learning Barriers Sight   wears glasses   Learning Preferences Verbal Instruction;Computer/Internet;Audio;Written Material  Education Topics:  Knowledge  Questionnaire Score:  Knowledge Questionnaire Score - 09/19/22 1433       Knowledge Questionnaire Score   Pre Score 20/24             Core Components/Risk Factors/Patient Goals at Admission:  Personal Goals and Risk Factors at Admission - 09/19/22 1434       Core Components/Risk Factors/Patient Goals on Admission    Weight Management Weight Gain             Core Components/Risk Factors/Patient Goals Review:   Goals and Risk Factor Review     Row Name 09/23/22 1625 10/02/22 1207           Core Components/Risk Factors/Patient Goals Review   Personal Goals Review Weight Management/Obesity Weight Management/Obesity      Review Laveah started intensive cardiac rehab on 09/23/22 and did well with exercise for her fitness level. Vital signs stable. Priyal did dicuss weight gain with RD Sam today Paysley started intensive cardiac rehab on 09/23/22 and is off to a good start to exercise.. Vital signs have been  stable.      Expected Outcomes Ariella will continue to participate in intensive cardiac rehab for exercise, nutrition and lifesyle modifications Reece will continue to participate in intensive cardiac rehab for exercise, nutrition and lifesyle modifications               Core Components/Risk Factors/Patient Goals at Discharge (Final Review):   Goals and Risk Factor Review - 10/02/22 1207       Core Components/Risk Factors/Patient Goals Review   Personal Goals Review Weight Management/Obesity    Review Krystyn started intensive cardiac rehab on 09/23/22 and is off to a good start to exercise.. Vital signs have been  stable.    Expected Outcomes Danely will continue to participate in intensive cardiac rehab for exercise, nutrition and lifesyle modifications             ITP Comments:  ITP Comments     Row Name 09/19/22 1121 09/23/22 1623 10/02/22 1201       ITP Comments Dr. Fransico Him medical director. Introduction to pritikin education program/ intensive  cardiac rehab. Initial orientation packet reviewed with patient. 30 Day ITP Review. Jalaine started intensive cardiac rehab on 09/23/22 and did well with exercise for her fitness level. Denicia is somewhat deconditioned 30 Day ITP Review. Gaetana started intensive cardiac rehab on 09/23/22 and is off to a good start to exercise              Comments: See ITP comments.Harrell Gave RN BSN

## 2022-10-04 ENCOUNTER — Encounter (HOSPITAL_COMMUNITY)
Admission: RE | Admit: 2022-10-04 | Discharge: 2022-10-04 | Disposition: A | Discharge status: Home / Self Care | Payer: Medicare Other | Source: Ambulatory Visit | Attending: Cardiology | Admitting: Cardiology

## 2022-10-04 DIAGNOSIS — Z952 Presence of prosthetic heart valve: Secondary | ICD-10-CM | POA: Diagnosis not present

## 2022-10-04 DIAGNOSIS — Z954 Presence of other heart-valve replacement: Secondary | ICD-10-CM

## 2022-10-07 ENCOUNTER — Encounter (HOSPITAL_COMMUNITY)
Admission: RE | Admit: 2022-10-07 | Discharge: 2022-10-07 | Disposition: A | Discharge status: Home / Self Care | Payer: Medicare Other | Source: Ambulatory Visit | Attending: Cardiology | Admitting: Cardiology

## 2022-10-07 DIAGNOSIS — Z954 Presence of other heart-valve replacement: Secondary | ICD-10-CM

## 2022-10-07 DIAGNOSIS — Z952 Presence of prosthetic heart valve: Secondary | ICD-10-CM

## 2022-10-09 ENCOUNTER — Encounter (HOSPITAL_COMMUNITY): Payer: Medicare Other

## 2022-10-11 ENCOUNTER — Encounter (HOSPITAL_COMMUNITY)
Admission: RE | Admit: 2022-10-11 | Discharge: 2022-10-11 | Disposition: A | Discharge status: Home / Self Care | Payer: Medicare Other | Source: Ambulatory Visit | Attending: Cardiology | Admitting: Cardiology

## 2022-10-11 DIAGNOSIS — Z952 Presence of prosthetic heart valve: Secondary | ICD-10-CM

## 2022-10-11 DIAGNOSIS — Z954 Presence of other heart-valve replacement: Secondary | ICD-10-CM

## 2022-10-14 ENCOUNTER — Encounter (HOSPITAL_COMMUNITY)
Admission: RE | Admit: 2022-10-14 | Discharge: 2022-10-14 | Disposition: A | Discharge status: Home / Self Care | Payer: Medicare Other | Source: Ambulatory Visit | Attending: Cardiology | Admitting: Cardiology

## 2022-10-14 DIAGNOSIS — Z954 Presence of other heart-valve replacement: Secondary | ICD-10-CM

## 2022-10-14 DIAGNOSIS — Z952 Presence of prosthetic heart valve: Secondary | ICD-10-CM | POA: Insufficient documentation

## 2022-10-14 DIAGNOSIS — Z48812 Encounter for surgical aftercare following surgery on the circulatory system: Secondary | ICD-10-CM | POA: Insufficient documentation

## 2022-10-16 ENCOUNTER — Encounter (HOSPITAL_COMMUNITY)
Admission: RE | Admit: 2022-10-16 | Discharge: 2022-10-16 | Disposition: A | Discharge status: Home / Self Care | Payer: Medicare Other | Source: Ambulatory Visit | Attending: Cardiology | Admitting: Cardiology

## 2022-10-16 DIAGNOSIS — Z952 Presence of prosthetic heart valve: Secondary | ICD-10-CM | POA: Diagnosis not present

## 2022-10-16 DIAGNOSIS — Z954 Presence of other heart-valve replacement: Secondary | ICD-10-CM

## 2022-10-18 ENCOUNTER — Encounter (HOSPITAL_COMMUNITY)
Admission: RE | Admit: 2022-10-18 | Discharge: 2022-10-18 | Disposition: A | Discharge status: Home / Self Care | Payer: Medicare Other | Source: Ambulatory Visit | Attending: Cardiology | Admitting: Cardiology

## 2022-10-18 DIAGNOSIS — Z954 Presence of other heart-valve replacement: Secondary | ICD-10-CM

## 2022-10-18 DIAGNOSIS — Z952 Presence of prosthetic heart valve: Secondary | ICD-10-CM | POA: Diagnosis not present

## 2022-10-21 ENCOUNTER — Encounter (HOSPITAL_COMMUNITY)
Admission: RE | Admit: 2022-10-21 | Discharge: 2022-10-21 | Disposition: A | Discharge status: Home / Self Care | Payer: Medicare Other | Source: Ambulatory Visit | Attending: Cardiology | Admitting: Cardiology

## 2022-10-21 DIAGNOSIS — Z952 Presence of prosthetic heart valve: Secondary | ICD-10-CM | POA: Diagnosis not present

## 2022-10-21 DIAGNOSIS — Z954 Presence of other heart-valve replacement: Secondary | ICD-10-CM

## 2022-10-23 ENCOUNTER — Encounter (HOSPITAL_COMMUNITY)
Admission: RE | Admit: 2022-10-23 | Discharge: 2022-10-23 | Disposition: A | Discharge status: Home / Self Care | Payer: Medicare Other | Source: Ambulatory Visit | Attending: Cardiology | Admitting: Cardiology

## 2022-10-23 DIAGNOSIS — Z952 Presence of prosthetic heart valve: Secondary | ICD-10-CM

## 2022-10-23 DIAGNOSIS — Z954 Presence of other heart-valve replacement: Secondary | ICD-10-CM

## 2022-10-24 NOTE — Progress Notes (Signed)
Cardiac Individual Treatment Plan  Patient Details  Name: Vanessa Waller MRN: [id number] Date of Birth: 1955/08/02 Referring Provider:   Flowsheet Row INTENSIVE CARDIAC REHAB ORIENT from 09/19/2022 in [facility]  Referring Provider Dr. Mina Marble (Fransico Him, MD covering)       Initial Encounter Date:  [place] from 09/19/2022 in [facility]  Date 09/19/22       Visit Diagnosis: 06/27/22 S/P pulmonary valve replacement  06/27/22 S/P tricuspid valve replacement, S/P aortic valve replacement  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,mesentery,pelvis   Nonspecific elevation of levels of transaminase or lactic acid dehydrogenase (LDH)    Small bowel obstruction ([place]) 2007   Uterine fibroid     Tobacco Use: Social History   Tobacco Use  Smoking Status Never  Smokeless Tobacco Never    Labs: Review Flowsheet       Latest Ref Rng & Units 11/30/2012 07/11/2015 01/16/2018  Labs for ITP Cardiac and Pulmonary Rehab  Cholestrol 125 - 200 mg/dL 171  160  -  LDL (calc) <130 mg/dL 82  88  -  HDL-C >=46 mg/dL 50  48  -  Trlycerides <150 mg/dL 196  120  -  TCO2 22 - 32 mmol/L - - 28     Capillary Blood Glucose: Lab Results  Component Value Date   GLUCAP 141 (H) 07/03/2012    GLUCAP 94 07/01/2012     Exercise Target Goals: Exercise Program Goal: Individual exercise prescription set using results from initial 6 min walk test and THRR while considering  patient's activity barriers and safety.   Exercise Prescription Goal: Initial exercise prescription builds to 30-45 minutes a day of aerobic activity, 2-3 days per week.  Home exercise guidelines will be given to patient during program as part of exercise prescription that the participant will acknowledge.  Activity Barriers & Risk Stratification:  Activity Barriers & Cardiac Risk Stratification - 09/19/22 1440       Activity Barriers & Cardiac Risk Stratification   Activity Barriers Deconditioning;Muscular Weakness;Shortness of Breath;Other (comment)    Comments Sternal Precautions    Cardiac Risk Stratification High             6 Minute Walk:  6 Minute Walk     Row Name 09/19/22 1134         6 Minute Walk   Phase Initial     Distance 1369 feet     Walk Time 6 minutes     # of Rest Breaks 0     MPH 2.6     METS 3.63     RPE 11     Perceived Dyspnea  1     VO2 Peak 12.72     Symptoms Yes (comment)  Comments SOB, RPD = 1     Resting HR 79 bpm     Resting BP 120/80     Resting Oxygen Saturation  100 %     Exercise Oxygen Saturation  during 6 min walk 99 %     Max Ex. HR 101 bpm     Max Ex. BP 128/70     2 Minute Post BP 122/78              Oxygen Initial Assessment:   Oxygen Re-Evaluation:   Oxygen Discharge (Final Oxygen Re-Evaluation):   Initial Exercise Prescription:  Initial Exercise Prescription - 09/19/22 1400       Date of Initial Exercise RX and Referring Provider   Date 09/19/22    Referring Provider Dr. Regino Schultze [name], MD covering)    Expected Discharge Date 11/29/22      Recumbant Bike   Level 1.5    RPM 60    Watts 25    Minutes 15    METs 3.6      NuStep   Level 2    SPM 75    Minutes 15    METs 3.6      Prescription Details    Frequency (times per week) 3    Duration Progress to 30 minutes of continuous aerobic without signs/symptoms of physical distress      Intensity   THRR 40-80% of Max Heartrate 62 -124    Ratings of Perceived Exertion 11-13    Perceived Dyspnea 0-4      Progression   Progression Continue progressive overload as per policy without signs/symptoms or physical distress.      Resistance Training   Training Prescription Yes    Weight 2 lbs    Reps 10-15             Perform Capillary Blood Glucose checks as needed.  Exercise Prescription Changes:   Exercise Prescription Changes     Row Name 09/23/22 1400 10/16/22 1500 10/23/22 1500         Response to Exercise   Blood Pressure (Admit) 108/72 110/70 130/80     Blood Pressure (Exercise) 130/60 138/86 142/82     Blood Pressure (Exit) 116/70 122/70 120/70     Heart Rate (Admit) 82 bpm 73 bpm 81 bpm     Heart Rate (Exercise) 87 bpm 82 bpm 81 bpm     Heart Rate (Exit) 78 bpm 73 bpm 71 bpm     Rating of Perceived Exertion (Exercise) 14 11 12      Symptoms None None None     Comments Pt's first day in the CRP2 program Reviewed METs Reviewed METs and goals     Duration Continue with 30 min of aerobic exercise without signs/symptoms of physical distress. Continue with 30 min of aerobic exercise without signs/symptoms of physical distress. Continue with 30 min of aerobic exercise without signs/symptoms of physical distress.     Intensity THRR unchanged THRR unchanged THRR unchanged       Progression   Progression Continue to progress workloads to maintain intensity without signs/symptoms of physical distress. Continue to progress workloads to maintain intensity without signs/symptoms of physical distress. Continue to progress workloads to maintain intensity without signs/symptoms of physical distress.     Average METs 1.7 2.1 2.5       Resistance Training   Training Prescription Yes No No     Weight 1 lb No weights on wednesdays No  weights on wednesdays  Reps 10-15 -- --     Time 10 Minutes -- --       Interval Training   Interval Training -- No No       Recumbant Bike   Level 1.5 1.5 1.5     RPM 34 40 --     Watts 8 113 15     Minutes 15 15 15      METs 2.1 2.5 3.1       NuStep   Level -- 2 2     SPM 52 72 76     Minutes 15 15 15      METs 1.3 1.7 1.9              Exercise Comments:   Exercise Comments     Row Name 09/23/22 1414 10/16/22 1552 10/23/22 1500       Exercise Comments Pt's first day in the CRP2 program. Pt's RPEs were 13-14. will continue to make adjustments as necessary to pt workout. Reviewed METs with patient. Pt is progressing her MET level with activity. Reviewed METs and goals. Pt also has goal of weight gain, pt has some weight gain which she is pleased with.              Exercise Goals and Review:   Exercise Goals     Row Name 09/19/22 1441             Exercise Goals   Increase Physical Activity Yes       Intervention Provide advice, education, support and counseling about physical activity/exercise needs.;Develop an individualized exercise prescription for aerobic and resistive training based on initial evaluation findings, risk stratification, comorbidities and participant's personal goals.       Expected Outcomes Short Term: Attend rehab on a regular basis to increase amount of physical activity.;Long Term: Add in home exercise to make exercise part of routine and to increase amount of physical activity.;Long Term: Exercising regularly at least 3-5 days a week.       Increase Strength and Stamina Yes       Intervention Provide advice, education, support and counseling about physical activity/exercise needs.;Develop an individualized exercise prescription for aerobic and resistive training based on initial evaluation findings, risk stratification, comorbidities and participant's personal goals.       Expected Outcomes Short Term: Increase workloads from initial  exercise prescription for resistance, speed, and METs.;Short Term: Perform resistance training exercises routinely during rehab and add in resistance training at home;Long Term: Improve cardiorespiratory fitness, muscular endurance and strength as measured by increased METs and functional capacity ( )       Able to understand and use rate of perceived exertion (RPE) scale Yes       Intervention Provide education and explanation on how to use RPE scale       Expected Outcomes Short Term: Able to use RPE daily in rehab to express subjective intensity level;Long Term:  Able to use RPE to guide intensity level when exercising independently       Knowledge and understanding of Target Heart Rate Range (THRR) Yes       Intervention Provide education and explanation of THRR including how the numbers were predicted and where they are located for reference       Expected Outcomes Short Term: Able to state/look up THRR;Short Term: Able to use daily as guideline for intensity in rehab;Long Term: Able to use THRR to govern intensity when exercising independently       Understanding of  Exercise Prescription Yes       Intervention Provide education, explanation, and written materials on patient's individual exercise prescription       Expected Outcomes Short Term: Able to explain program exercise prescription;Long Term: Able to explain home exercise prescription to exercise independently                Exercise Goals Re-Evaluation :  Exercise Goals Re-Evaluation     Row Name 09/23/22 1412 10/23/22 1500           Exercise Goal Re-Evaluation   Exercise Goals Review Increase Physical Activity;Increase Strength and Stamina;Able to understand and use rate of perceived exertion (RPE) scale;Knowledge and understanding of Target Heart Rate Range (THRR);Understanding of Exercise Prescription Increase Physical Activity;Increase Strength and Stamina;Able to understand and use rate of perceived exertion (RPE)  scale;Knowledge and understanding of Target Heart Rate Range (THRR);Understanding of Exercise Prescription      Comments Pt's first day in the CRP2 program. Pt understands the exercise Rx, THRR, RPE scale. Reviewed METs and goals. Pt is making slow, steady progress. Pt voices that she has increased her strength and stamina since starting the program.      Expected Outcomes Will continue to monitor the patient and progress exercise workloads as tolerated. Will continue to monitor the patient and progress exercise workloads as tolerated.               Discharge Exercise Prescription (Final Exercise Prescription Changes):  Exercise Prescription Changes - 10/23/22 1500       Response to Exercise   Blood Pressure (Admit) 130/80    Blood Pressure (Exercise) 142/82    Blood Pressure (Exit) 120/70    Heart Rate (Admit) 81 bpm    Heart Rate (Exercise) 81 bpm    Heart Rate (Exit) 71 bpm    Rating of Perceived Exertion (Exercise) 12    Symptoms None    Comments Reviewed METs and goals    Duration Continue with 30 min of aerobic exercise without signs/symptoms of physical distress.    Intensity THRR unchanged      Progression   Progression Continue to progress workloads to maintain intensity without signs/symptoms of physical distress.    Average METs 2.5      Resistance Training   Training Prescription No    Weight No weights on wednesdays      Interval Training   Interval Training No      Recumbant Bike   Level 1.5    Watts 15    Minutes 15    METs 3.1      NuStep   Level 2    SPM 76    Minutes 15    METs 1.9             Nutrition:  Target Goals: Understanding of nutrition guidelines, daily intake of sodium 1500mg , cholesterol 200mg , calories 30% from fat and 7% or less from saturated fats, daily to have 5 or more servings of fruits and vegetables.  Biometrics:  Pre Biometrics - 09/19/22 1045       Pre Biometrics   Waist Circumference 29.5 inches    Hip  Circumference 35.5 inches    Waist to Hip Ratio 0.83 %    Triceps Skinfold 10 mm    % Body Fat 27.2 %    Grip Strength 22 kg    Flexibility 0 in   could not reach box   Single Leg Stand 30 seconds  Nutrition Therapy Plan and Nutrition Goals:  Nutrition Therapy & Goals - 10/18/22 1203       Nutrition Therapy   Diet General Healthful Diet    Protein (specify units) 62-77g/day (1.2-1.5g/kg IBW)      Personal Nutrition Goals   Nutrition Goal Patient to identify strategies for weight gain of 0.5-2.0# per week.    Personal Goal #2 Patient to improve diet quality by using the plate method as a guide for meal planning to include lean protein/plant protein, fruits, vegetables, whole grains, nonfat dairy as part of a well-balanced diet.    Comments Goals in action. Vanessa Waller is motivated to gain weight. She reports her lowest weight was ~90# and she is uncertain of a goal weight at this time. She is living with her sister/brother in law while she is recovering from surgery. They are an excellent support. She reports normal appetite, eating three meals daily, and using Equate Nutritional Shake Plus on occasion (350kcals, 13g protein). Encouraged patient to increased Equate Nutritional Shake Plus to twice daily (700kcals, 26g protein) in additon to three meals. She is up 3.5# since starting with our program. Vanessa Waller will continue to benefit from participation in intensive cardiac rehab for nutrition, exercise, and lifestyle modification.      Intervention Plan   Intervention Prescribe, educate and counsel regarding individualized specific dietary modifications aiming towards targeted core components such as weight, hypertension, lipid management, diabetes, heart failure and other comorbidities.;Nutrition handout(s) given to patient.    Expected Outcomes Short Term Goal: Understand basic principles of dietary content, such as calories, fat, sodium, cholesterol and nutrients.;Long Term Goal:  Adherence to prescribed nutrition plan.             Nutrition Assessments:  Nutrition Assessments - 09/23/22 1547       Rate Your Plate Scores   Pre Score 49            MEDIFICTS Score Key: ?70 Need to make dietary changes  40-70 Heart Healthy Diet ? 40 Therapeutic Level Cholesterol Diet   Flowsheet Row INTENSIVE CARDIAC REHAB from 09/23/2022 in [facility]  Picture Your Plate Total Score on Admission 49      Picture Your Plate Scores: <16 Unhealthy dietary pattern with much room for improvement. 41-50 Dietary pattern unlikely to meet recommendations for good health and room for improvement. 51-60 More healthful dietary pattern, with some room for improvement.  >60 Healthy dietary pattern, although there may be some specific behaviors that could be improved.    Nutrition Goals Re-Evaluation:  Nutrition Goals Re-Evaluation     Row Name 09/23/22 1334 10/18/22 1203           Goals   Current Weight 103 lb 2.8 oz (46.8 kg) 107 lb 12.9 oz (48.9 kg)      Comment IBW 51.25kg, current BMI 18.5, 1404-1638kcals/day (30-35kcals/kg actual body weight) --      Expected Outcome Vanessa Waller is motivated to gain weight. She reports her lowest weight was ~90# and she is uncertain of a goal weight at this time. She is living with her sister while she is recovering from surgery. She reports normal appetite, eating three meals daily, and using Equate Nutritional Shake Plus on occasion (350kcals, 13g protein). Encouraged patient to increased Equate Nutritional Shake Plus to twice daily (700kcals, 26g protein) in additon to three meals. Donte will continue to benefit from participation in intensive cardiac rehab for nutrition, exercise, and lifestyle modification. Goals  in action. Montserrat is motivated to gain weight. She reports her lowest weight was ~90# and she is uncertain of a goal weight at this time. She is living with her  sister/brother in law while she is recovering from surgery. They are an excellent support. She reports normal appetite, eating three meals daily, and using Equate Nutritional Shake Plus on occasion (350kcals, 13g protein). Encouraged patient to increased Equate Nutritional Shake Plus to twice daily (700kcals, 26g protein) in additon to three meals. She is up 3.5# since starting with our program. Hannelore will continue to benefit from participation in intensive cardiac rehab for nutrition, exercise, and lifestyle modification.               Nutrition Goals Re-Evaluation:  Nutrition Goals Re-Evaluation     Row Name 09/23/22 1334 10/18/22 1203           Goals   Current Weight 103 lb 2.8 oz (46.8 kg) 107 lb 12.9 oz (48.9 kg)      Comment IBW 51.25kg, current BMI 18.5, 1404-1638kcals/day (30-35kcals/kg actual body weight) --      Expected Outcome Vanessa Waller is motivated to gain weight. She reports her lowest weight was ~90# and she is uncertain of a goal weight at this time. She is living with her sister while she is recovering from surgery. She reports normal appetite, eating three meals daily, and using Equate Nutritional Shake Plus on occasion (350kcals, 13g protein). Encouraged patient to increased Equate Nutritional Shake Plus to twice daily (700kcals, 26g protein) in additon to three meals. [place] will continue to benefit from participation in intensive cardiac rehab for nutrition, exercise, and lifestyle modification. Goals in action. Vanessa Waller is motivated to gain weight. She reports her lowest weight was ~90# and she is uncertain of a goal weight at this time. She is living with her sister/brother in law while she is recovering from surgery. They are an excellent support. She reports normal appetite, eating three meals daily, and using Equate Nutritional Shake Plus on occasion (350kcals, 13g protein). Encouraged patient to increased Equate Nutritional Shake Plus to twice daily (700kcals, 26g protein)  in additon to three meals. She is up 3.5# since starting with our program. Vanessa Waller will continue to benefit from participation in intensive cardiac rehab for nutrition, exercise, and lifestyle modification.               Nutrition Goals Discharge (Final Nutrition Goals Re-Evaluation):  Nutrition Goals Re-Evaluation - 10/18/22 1203       Goals   Current Weight 107 lb 12.9 oz (48.9 kg)    Expected Outcome Goals in action. Vanessa Waller is motivated to gain weight. She reports her lowest weight was ~90# and she is uncertain of a goal weight at this time. She is living with her sister/brother in law while she is recovering from surgery. They are an excellent support. She reports normal appetite, eating three meals daily, and using Equate Nutritional Shake Plus on occasion (350kcals, 13g protein). Encouraged patient to increased Equate Nutritional Shake Plus to twice daily (700kcals, 26g protein) in additon to three meals. She is up 3.5# since starting with our program. Vanessa Waller will continue to benefit from participation in intensive cardiac rehab for nutrition, exercise, and lifestyle modification.             Psychosocial: Target Goals: Acknowledge presence or absence of significant depression and/or stress, maximize coping skills, provide positive support system. Participant is able to verbalize types and ability to use techniques and skills needed for  reducing stress and depression.  Initial Review & Psychosocial Screening:  Initial Psych Review & Screening - 09/19/22 1428       Initial Review   Current issues with None Identified      Family Dynamics   Good Support System? Yes    Comments Pt has her sister and currently lives with her s/p surgery. Pt also voices having other family members for support.      Barriers   Psychosocial barriers to participate in program There are no identifiable barriers or psychosocial needs.      Screening Interventions   Interventions Encouraged to  exercise             Quality of Life Scores:  Quality of Life - 09/19/22 1432       Quality of Life   Select Quality of Life      Quality of Life Scores   Health/Function Pre 20.43 %    Socioeconomic Pre 20.36 %    Psych/Spiritual Pre 22.5 %    Family Pre 19.17 %    GLOBAL Pre 20.69 %            Scores of 19 and below usually indicate a poorer quality of life in these areas.  A difference of  2-3 points is a clinically meaningful difference.  A difference of 2-3 points in the total score of the Quality of Life Index has been associated with significant improvement in overall quality of life, self-image, physical symptoms, and general health in studies assessing change in quality of life.  PHQ-9: Review Flowsheet       09/19/2022  Depression screen PHQ 2/9  Decreased Interest 0  Down, Depressed, Hopeless 0  PHQ - 2 Score 0  Altered sleeping 0  Tired, decreased energy 0  Change in appetite 0  Feeling bad or failure about yourself  0  Trouble concentrating 0  Moving slowly or fidgety/restless 0  Suicidal thoughts 0  PHQ-9 Score 0  Difficult doing work/chores Not difficult at all   Interpretation of Total Score  Total Score Depression Severity:  1-4 = Minimal depression, 5-9 = Mild depression, 10-14 = Moderate depression, 15-19 = Moderately severe depression, 20-27 = Severe depression   Psychosocial Evaluation and Intervention:   Psychosocial Re-Evaluation:  Psychosocial Re-Evaluation     Row Name 09/23/22 1625 10/02/22 1204 10/24/22 1652         Psychosocial Re-Evaluation   Current issues with None Identified None Identified None Identified     Comments -- Vanessa Waller recently moved back home and is driving now --     Interventions Encouraged to attend Cardiac Rehabilitation for the exercise Encouraged to attend Cardiac Rehabilitation for the exercise Encouraged to attend Cardiac Rehabilitation for the exercise     Continue Psychosocial Services  No Follow up  required No Follow up required No Follow up required              Psychosocial Discharge (Final Psychosocial Re-Evaluation):  Psychosocial Re-Evaluation - 10/24/22 1652       Psychosocial Re-Evaluation   Current issues with None Identified    Comments --    Interventions Encouraged to attend Cardiac Rehabilitation for the exercise    Continue Psychosocial Services  No Follow up required             Vocational Rehabilitation: Provide vocational rehab assistance to qualifying candidates.   Vocational Rehab Evaluation & Intervention:  Vocational Rehab - 09/19/22 1433       Initial  Vocational Rehab Evaluation & Intervention   Assessment shows need for Vocational Rehabilitation No      Vocational Rehab Re-Evaulation   Comments Pt is retired and has no needs at this time             Education: Education Goals: Education classes will be provided on a weekly basis, covering required topics. Participant will state understanding/return demonstration of topics presented.    Education     Row Name 09/23/22 1400     Education   Cardiac Education Topics Pritikin   Geographical information systems officer Exercise   Exercise Workshop Location manager and Fall Prevention   Instruction Review Code 1- Verbalizes Understanding   Class Start Time 1145   Class Stop Time 1230   Class Time Calculation (min) 45 min    Row Name 09/25/22 1300     Education   Cardiac Education Topics Pritikin   Customer service manager   Weekly Topic Fast and Healthy Breakfasts   Instruction Review Code 1- Verbalizes Understanding   Class Start Time 1145   Class Stop Time 1226   Class Time Calculation (min) 41 min    Row Name 09/27/22 1200     Education   Cardiac Education Topics Pritikin   Licensed conveyancer Nutrition   Nutrition Overview of the Pritikin Eating  Plan   Instruction Review Code 1- Verbalizes Understanding   Class Start Time 1150   Class Stop Time 1230   Class Time Calculation (min) 40 min    Row Name 09/30/22 1700     Education   Cardiac Education Topics Pritikin   Select Workshops     Workshops   Educator Exercise Physiologist   Select Psychosocial   Psychosocial Workshop Recognizing and Reducing Stress   Instruction Review Code 1- Verbalizes Understanding   Class Start Time 1405   Class Stop Time 1454   Class Time Calculation (min) 49 min    Row Name 10/02/22 1200     Education   Cardiac Education Topics Pritikin   Customer service manager   Weekly Topic Personalizing Your Pritikin Plate   Instruction Review Code 1- Verbalizes Understanding   Class Start Time 1140   Class Stop Time 1220   Class Time Calculation (min) 40 min    Row Name 10/04/22 1400     Education   Cardiac Education Topics Pritikin   Nurse, children's Exercise Physiologist   Select Psychosocial   Psychosocial Healthy Minds, Bodies, Hearts   Instruction Review Code 1- Verbalizes Understanding   Class Start Time 1151   Class Stop Time 1230   Class Time Calculation (min) 39 min    Row Name 10/07/22 1300     Education   Cardiac Education Topics Pritikin   Glass blower/designer Nutrition   Nutrition Workshop Label Reading   Instruction Review Code 1- Tax inspector   Class Start Time 1150   Class Stop Time 1233   Class Time Calculation (min) 43 min    Row Name 10/11/22 1200     Education   Cardiac Education Topics Pritikin   Psychologist, sport and exercise  Core Videos   Educator Dietitian   Select Nutrition   Nutrition Other   Instruction Review Code 1- Verbalizes Understanding   Class Start Time 1150   Class Stop Time 1226   Class Time Calculation (min) 36 min    Row Name 10/14/22 1300     Education   Cardiac  Education Topics Pritikin   Geographical information systems officer Exercise   Exercise Workshop Exercise Basics: Building Your Action Plan   Instruction Review Code 1- Verbalizes Understanding   Class Start Time 1150   Class Stop Time 1235   Class Time Calculation (min) 45 min    Row Name 10/16/22 1500     Education   Cardiac Education Topics Pritikin   Orthoptist   Educator Dietitian   Weekly Topic Tasty Appetizers and Snacks   Instruction Review Code 1- Verbalizes Understanding   Class Start Time 1145   Class Stop Time 1223   Class Time Calculation (min) 38 min    Row Name 10/18/22 1300     Education   Cardiac Education Topics Pritikin   Licensed conveyancer Nutrition   Nutrition Calorie Density   Instruction Review Code 1- Verbalizes Understanding   Class Start Time 1145   Class Stop Time 1225   Class Time Calculation (min) 40 min    Row Name 10/21/22 1400     Education   Cardiac Education Topics Pritikin   Nurse, children's   Educator Dietitian   Select Nutrition   Nutrition Nutrition Action Plan   Instruction Review Code 1- Verbalizes Understanding   Class Start Time 1145   Class Stop Time 1219   Class Time Calculation (min) 34 min    Row Name 10/23/22 1300     Education   Cardiac Education Topics Pritikin   Customer service manager   Weekly Topic Efficiency Cooking - Meals in a Snap   Instruction Review Code 1- Verbalizes Understanding   Class Start Time 1140   Class Stop Time 1220   Class Time Calculation (min) 40 min            Core Videos: Exercise    Move It!  Clinical staff conducted group or individual video education with verbal and written material and guidebook.  Patient learns the recommended Pritikin exercise program. Exercise with the goal of living a long,  healthy life. Some of the health benefits of exercise include controlled diabetes, healthier blood pressure levels, improved cholesterol levels, improved heart and lung capacity, improved sleep, and better body composition. Everyone should speak with their doctor before starting or changing an exercise routine.  Biomechanical Limitations Clinical staff conducted group or individual video education with verbal and written material and guidebook.  Patient learns how biomechanical limitations can impact exercise and how we can mitigate and possibly overcome limitations to have an impactful and balanced exercise routine.  Body Composition Clinical staff conducted group or individual video education with verbal and written material and guidebook.  Patient learns that body composition (ratio of muscle mass to fat mass) is a key component to assessing overall fitness, rather than body weight alone. Increased fat mass, especially visceral belly fat, can put [place] at increased risk for metabolic syndrome, type 2  diabetes, heart disease, and even death. It is recommended to combine diet and exercise (cardiovascular and resistance training) to improve your body composition. Seek guidance from your physician and exercise physiologist before implementing an exercise routine.  Exercise Action Plan Clinical staff conducted group or individual video education with verbal and written material and guidebook.  Patient learns the recommended strategies to achieve and enjoy long-term exercise adherence, including variety, self-motivation, self-efficacy, and positive decision making. Benefits of exercise include fitness, good health, weight management, more energy, better sleep, less stress, and overall well-being.  Medical   Heart Disease Risk Reduction Clinical staff conducted group or individual video education with verbal and written material and guidebook.  Patient learns our heart is our most vital organ as it  circulates oxygen, nutrients, white blood cells, and hormones throughout the entire body, and carries waste away. Data supports a plant-based eating plan like the Pritikin Program for its effectiveness in slowing progression of and reversing heart disease. The video provides a number of recommendations to address heart disease.   Metabolic Syndrome and Belly Fat  Clinical staff conducted group or individual video education with verbal and written material and guidebook.  Patient learns what metabolic syndrome is, how it leads to heart disease, and how one can reverse it and keep it from coming back. You have metabolic syndrome if you have 3 of the following 5 criteria: abdominal obesity, high blood pressure, high triglycerides, low HDL cholesterol, and high blood sugar.  Hypertension and Heart Disease Clinical staff conducted group or individual video education with verbal and written material and guidebook.  Patient learns that high blood pressure, or hypertension, is very common in the [place]. Hypertension is largely due to excessive salt intake, but other important risk factors include being overweight, physical inactivity, drinking too much alcohol, smoking, and not eating enough potassium from fruits and vegetables. High blood pressure is a leading risk factor for heart attack, stroke, congestive heart failure, dementia, kidney failure, and premature death. Long-term effects of excessive salt intake include stiffening of the arteries and thickening of heart muscle and organ damage. Recommendations include ways to reduce hypertension and the risk of heart disease.  Diseases of Our Time - Focusing on Diabetes Clinical staff conducted group or individual video education with verbal and written material and guidebook.  Patient learns why the best way to stop diseases of our time is prevention, through food and other lifestyle changes. Medicine (such as prescription pills and surgeries) is often  only a Band-Aid on the problem, not a long-term solution. Most common diseases of our time include obesity, type 2 diabetes, hypertension, heart disease, and cancer. The Pritikin Program is recommended and has been proven to help reduce, reverse, and/or prevent the damaging effects of metabolic syndrome.  Nutrition   Overview of the Pritikin Eating Plan  Clinical staff conducted group or individual video education with verbal and written material and guidebook.  Patient learns about the Pritikin Eating Plan for disease risk reduction. The Pritikin Eating Plan emphasizes a wide variety of unrefined, minimally-processed carbohydrates, like fruits, vegetables, whole grains, and legumes. Go, Caution, and Stop food choices are explained. Plant-based and lean animal proteins are emphasized. Rationale provided for low sodium intake for blood pressure control, low added sugars for blood sugar stabilization, and low added fats and oils for coronary artery disease risk reduction and weight management.  Calorie Density  Clinical staff conducted group or individual video education with verbal and written material and guidebook.  Patient  learns about calorie density and how it impacts the Pritikin Eating Plan. Knowing the characteristics of the food you choose will help you decide whether those foods will lead to weight gain or weight loss, and whether you want to consume more or less of them. Weight loss is usually a side effect of the Pritikin Eating Plan because of its focus on low calorie-dense foods.  Label Reading  Clinical staff conducted group or individual video education with verbal and written material and guidebook.  Patient learns about the Pritikin recommended label reading guidelines and corresponding recommendations regarding calorie density, added sugars, sodium content, and whole grains.  Dining Out - Part 1  Clinical staff conducted group or individual video education with verbal and written  material and guidebook.  Patient learns that restaurant meals can be sabotaging because they can be so high in calories, fat, sodium, and/or sugar. Patient learns recommended strategies on how to positively address this and avoid unhealthy pitfalls.  Facts on Fats  Clinical staff conducted group or individual video education with verbal and written material and guidebook.  Patient learns that lifestyle modifications can be just as effective, if not more so, as many medications for lowering your risk of heart disease. A Pritikin lifestyle can help to reduce your risk of inflammation and atherosclerosis (cholesterol build-up, or plaque, in the artery walls). Lifestyle interventions such as dietary choices and physical activity address the cause of atherosclerosis. A review of the types of fats and their impact on blood cholesterol levels, along with dietary recommendations to reduce fat intake is also included.  Nutrition Action Plan  Clinical staff conducted group or individual video education with verbal and written material and guidebook.  Patient learns how to incorporate Pritikin recommendations into their lifestyle. Recommendations include planning and keeping personal health goals in mind as an important part of their success.  Healthy Mind-Set    Healthy Minds, Bodies, Hearts  Clinical staff conducted group or individual video education with verbal and written material and guidebook.  Patient learns how to identify when they are stressed. Video will discuss the impact of that stress, as well as the many benefits of stress management. Patient will also be introduced to stress management techniques. The way we think, act, and feel has an impact on our hearts.  How Our Thoughts Can Heal Our Hearts  Clinical staff conducted group or individual video education with verbal and written material and guidebook.  Patient learns that negative thoughts can cause depression and anxiety. This can result in  negative lifestyle behavior and serious health problems. Cognitive behavioral therapy is an effective method to help control our thoughts in order to change and improve our emotional outlook.  Additional Videos:  Exercise    Improving Performance  Clinical staff conducted group or individual video education with verbal and written material and guidebook.  Patient learns to use a non-linear approach by alternating intensity levels and lengths of time spent exercising to help burn more calories and lose more body fat. Cardiovascular exercise helps improve heart health, metabolism, hormonal balance, blood sugar control, and recovery from fatigue. Resistance training improves strength, endurance, balance, coordination, reaction time, metabolism, and muscle mass. Flexibility exercise improves circulation, posture, and balance. Seek guidance from your physician and exercise physiologist before implementing an exercise routine and learn your capabilities and proper form for all exercise.  Introduction to Yoga  Clinical staff conducted group or individual video education with verbal and written material and guidebook.  Patient learns about yoga,  a discipline of the coming together of mind, breath, and body. The benefits of yoga include improved flexibility, improved range of motion, better posture and core strength, increased lung function, weight loss, and positive self-image. Yoga's heart health benefits include lowered blood pressure, healthier heart rate, decreased cholesterol and triglyceride levels, improved immune function, and reduced stress. Seek guidance from your physician and exercise physiologist before implementing an exercise routine and learn your capabilities and proper form for all exercise.  Medical   Aging: Enhancing Your Quality of Life  Clinical staff conducted group or individual video education with verbal and written material and guidebook.  Patient learns key strategies and  recommendations to stay in good physical health and enhance quality of life, such as prevention strategies, having an advocate, securing a Health Care Proxy and Power of Attorney, and keeping a list of medications and system for tracking them. It also discusses how to avoid risk for bone loss.  Biology of Weight Control  Clinical staff conducted group or individual video education with verbal and written material and guidebook.  Patient learns that weight gain occurs because we consume more calories than we burn (eating more, moving less). Even if your body weight is normal, you may have higher ratios of fat compared to muscle mass. Too much body fat puts you at increased risk for cardiovascular disease, heart attack, stroke, type 2 diabetes, and obesity-related cancers. In addition to exercise, following the Pritikin Eating Plan can help reduce your risk.  Decoding Lab Results  Clinical staff conducted group or individual video education with verbal and written material and guidebook.  Patient learns that lab test reflects one measurement whose values change over time and are influenced by many factors, including medication, stress, sleep, exercise, food, hydration, pre-existing medical conditions, and more. It is recommended to use the knowledge from this video to become more involved with your lab results and evaluate your numbers to speak with your doctor.   Diseases of Our Time - Overview  Clinical staff conducted group or individual video education with verbal and written material and guidebook.  Patient learns that according to the CDC, 50% to 70% of chronic diseases (such as obesity, type 2 diabetes, elevated lipids, hypertension, and heart disease) are avoidable through lifestyle improvements including healthier food choices, listening to satiety cues, and increased physical activity.  Sleep Disorders Clinical staff conducted group or individual video education with verbal and written  material and guidebook.  Patient learns how good quality and duration of sleep are important to overall health and well-being. Patient also learns about sleep disorders and how they impact health along with recommendations to address them, including discussing with a physician.  Nutrition  Dining Out - Part 2 Clinical staff conducted group or individual video education with verbal and written material and guidebook.  Patient learns how to plan ahead and communicate in order to maximize their dining experience in a healthy and nutritious manner. Included are recommended food choices based on the type of restaurant the patient is visiting.   Fueling a Banker conducted group or individual video education with verbal and written material and guidebook.  There is a strong connection between our food choices and our health. Diseases like obesity and type 2 diabetes are very prevalent and are in large-part due to lifestyle choices. The Pritikin Eating Plan provides plenty of food and hunger-curbing satisfaction. It is easy to follow, affordable, and helps reduce health risks.  Menu Workshop  Clinical staff  conducted group or individual video education with verbal and written material and guidebook.  Patient learns that restaurant meals can sabotage health goals because they are often packed with calories, fat, sodium, and sugar. Recommendations include strategies to plan ahead and to communicate with the manager, chef, or server to help order a healthier meal.  Planning Your Eating Strategy  Clinical staff conducted group or individual video education with verbal and written material and guidebook.  Patient learns about the Pritikin Eating Plan and its benefit of reducing the risk of disease. The Pritikin Eating Plan does not focus on calories. Instead, it emphasizes high-quality, nutrient-rich foods. By knowing the characteristics of the foods, we choose, we can determine their  calorie density and make informed decisions.  Targeting Your Nutrition Priorities  Clinical staff conducted group or individual video education with verbal and written material and guidebook.  Patient learns that lifestyle habits have a tremendous impact on disease risk and progression. This video provides eating and physical activity recommendations based on your personal health goals, such as reducing LDL cholesterol, losing weight, preventing or controlling type 2 diabetes, and reducing high blood pressure.  Vitamins and Minerals  Clinical staff conducted group or individual video education with verbal and written material and guidebook.  Patient learns different ways to obtain key vitamins and minerals, including through a recommended healthy diet. It is important to discuss all supplements you take with your doctor.   Healthy Mind-Set    Smoking Cessation  Clinical staff conducted group or individual video education with verbal and written material and guidebook.  Patient learns that cigarette smoking and tobacco addiction pose a serious health risk which affects millions of people. Stopping smoking will significantly reduce the risk of heart disease, lung disease, and many forms of cancer. Recommended strategies for quitting are covered, including working with your doctor to develop a successful plan.  Culinary   Becoming a Set designer conducted group or individual video education with verbal and written material and guidebook.  Patient learns that cooking at home can be healthy, cost-effective, quick, and puts them in control. Keys to cooking healthy recipes will include looking at your recipe, assessing your equipment needs, planning ahead, making it simple, choosing cost-effective seasonal ingredients, and limiting the use of added fats, salts, and sugars.  Cooking - Breakfast and Snacks  Clinical staff conducted group or individual video education with verbal and  written material and guidebook.  Patient learns how important breakfast is to satiety and nutrition through the entire day. Recommendations include key foods to eat during breakfast to help stabilize blood sugar levels and to prevent overeating at meals later in the day. Planning ahead is also a key component.  Cooking - Educational psychologist conducted group or individual video education with verbal and written material and guidebook.  Patient learns eating strategies to improve overall health, including an approach to cook more at home. Recommendations include thinking of animal protein as a side on your plate rather than center stage and focusing instead on lower calorie dense options like vegetables, fruits, whole grains, and plant-based proteins, such as beans. Making sauces in large quantities to freeze for later and leaving the skin on your vegetables are also recommended to maximize your experience.  Cooking - Healthy Salads and Dressing Clinical staff conducted group or individual video education with verbal and written material and guidebook.  Patient learns that vegetables, fruits, whole grains, and legumes are the foundations of the  Pritikin Eating Plan. Recommendations include how to incorporate each of these in flavorful and healthy salads, and how to create homemade salad dressings. Proper handling of ingredients is also covered. Cooking - Soups and [employer] - Soups and Desserts Clinical staff conducted group or individual video education with verbal and written material and guidebook.  Patient learns that Pritikin soups and desserts make for easy, nutritious, and delicious snacks and meal components that are low in sodium, fat, sugar, and calorie density, while high in vitamins, minerals, and filling fiber. Recommendations include simple and healthy ideas for soups and desserts.   Overview     The Pritikin Solution Program Overview Clinical staff conducted group or  individual video education with verbal and written material and guidebook.  Patient learns that the results of the Pritikin Program have been documented in more than 100 articles published in peer-reviewed journals, and the benefits include reducing risk factors for (and, in some cases, even reversing) high cholesterol, high blood pressure, type 2 diabetes, obesity, and more! An overview of the three key pillars of the Pritikin Program will be covered: eating well, doing regular exercise, and having a healthy mind-set.  WORKSHOPS  Exercise: Exercise Basics: Building Your Action Plan Clinical staff led group instruction and group discussion with PowerPoint presentation and patient guidebook. To enhance the learning environment the use of posters, models and videos may be added. At the conclusion of this workshop, patients will comprehend the difference between physical activity and exercise, as well as the benefits of incorporating both, into their routine. Patients will understand the FITT (Frequency, Intensity, Time, and Type) principle and how to use it to build an exercise action plan. In addition, safety concerns and other considerations for exercise and cardiac rehab will be addressed by the presenter. The purpose of this lesson is to promote a comprehensive and effective weekly exercise routine in order to improve patients' overall level of fitness.   Managing Heart Disease: Your Path to a Healthier Heart Clinical staff led group instruction and group discussion with PowerPoint presentation and patient guidebook. To enhance the learning environment the use of posters, models and videos may be added.At the conclusion of this workshop, patients will understand the anatomy and physiology of the heart. Additionally, they will understand how Pritikin's three pillars impact the risk factors, the progression, and the management of heart disease.  The purpose of this lesson is to provide a high-level  overview of the heart, heart disease, and how the Pritikin lifestyle positively impacts risk factors.  Exercise Biomechanics Clinical staff led group instruction and group discussion with PowerPoint presentation and patient guidebook. To enhance the learning environment the use of posters, models and videos may be added. Patients will learn how the structural parts of their bodies function and how these functions impact their daily activities, movement, and exercise. Patients will learn how to promote a neutral spine, learn how to manage pain, and identify ways to improve their physical movement in order to promote healthy living. The purpose of this lesson is to expose patients to common physical limitations that impact physical activity. Participants will learn practical ways to adapt and manage aches and pains, and to minimize their effect on regular exercise. Patients will learn how to maintain good posture while sitting, walking, and lifting.  Balance Training and Fall Prevention  Clinical staff led group instruction and group discussion with PowerPoint presentation and patient guidebook. To enhance the learning environment the use of posters, models and videos may be  added. At the conclusion of this workshop, patients will understand the importance of their sensorimotor skills (vision, proprioception, and the vestibular system) in maintaining their ability to balance as they age. Patients will apply a variety of balancing exercises that are appropriate for their current level of function. Patients will understand the common causes for poor balance, possible solutions to these problems, and ways to modify their physical environment in order to minimize their fall risk. The purpose of this lesson is to teach patients about the importance of maintaining balance as they age and ways to minimize their risk of falling.  WORKSHOPS   Nutrition:  Fueling a Ship brokerHealthy Body Clinical staff led group  instruction and group discussion with PowerPoint presentation and patient guidebook. To enhance the learning environment the use of posters, models and videos may be added. Patients will review the foundational principles of the Pritikin Eating Plan and understand what constitutes a serving size in each of the food groups. Patients will also learn Pritikin-friendly foods that are better choices when away from home and review make-ahead meal and snack options. Calorie density will be reviewed and applied to three nutrition priorities: weight maintenance, weight loss, and weight gain. The purpose of this lesson is to reinforce (in a group setting) the key concepts around what patients are recommended to eat and how to apply these guidelines when away from home by planning and selecting Pritikin-friendly options. Patients will understand how calorie density may be adjusted for different weight management goals.  Mindful Eating  Clinical staff led group instruction and group discussion with PowerPoint presentation and patient guidebook. To enhance the learning environment the use of posters, models and videos may be added. Patients will briefly review the concepts of the Pritikin Eating Plan and the importance of low-calorie dense foods. The concept of mindful eating will be introduced as well as the importance of paying attention to internal hunger signals. Triggers for non-hunger eating and techniques for dealing with triggers will be explored. The purpose of this lesson is to provide patients with the opportunity to review the basic principles of the Pritikin Eating Plan, discuss the value of eating mindfully and how to measure internal cues of hunger and fullness using the Hunger Scale. Patients will also discuss reasons for non-hunger eating and learn strategies to use for controlling emotional eating.  Targeting Your Nutrition Priorities Clinical staff led group instruction and group discussion with  PowerPoint presentation and patient guidebook. To enhance the learning environment the use of posters, models and videos may be added. Patients will learn how to determine their genetic susceptibility to disease by reviewing their family history. Patients will gain insight into the importance of diet as part of an overall healthy lifestyle in mitigating the impact of genetics and other environmental insults. The purpose of this lesson is to provide patients with the opportunity to assess their personal nutrition priorities by looking at their family history, their own health history and current risk factors. Patients will also be able to discuss ways of prioritizing and modifying the Pritikin Eating Plan for their highest risk areas  Menu  Clinical staff led group instruction and group discussion with PowerPoint presentation and patient guidebook. To enhance the learning environment the use of posters, models and videos may be added. Using menus brought in from [employer] restaurants, or printed from [employer] menus, patients will apply the Pritikin dining out guidelines that were presented in the [employer] Dining Out educational video. Patients will also be able to practice these  guidelines in a variety of provided scenarios. The purpose of this lesson is to provide patients with the opportunity to practice hands-on learning of the Pritikin Dining Out guidelines with actual menus and practice scenarios.  Label Reading Clinical staff led group instruction and group discussion with PowerPoint presentation and patient guidebook. To enhance the learning environment the use of posters, models and videos may be added. Patients will review and discuss the Pritikin label reading guidelines presented in [employer]ikin's Label Reading Educational series video. Using fool labels brought in from local grocery stores and markets, patients will apply the label reading guidelines and determine if the packaged food meet the Pritikin  guidelines. The purpose of this lesson is to provide patients with the opportunity to review, discuss, and practice hands-on learning of the Pritikin Label Reading guidelines with actual packaged food labels. Cooking School  Pritikin's [employer] are designed to teach patients ways to prepare quick, simple, and affordable recipes at home. The importance of nutrition's role in chronic disease risk reduction is reflected in its emphasis in the overall Pritikin program. By learning how to prepare essential core Pritikin Eating Plan recipes, patients will increase control over what they eat; be able to customize the flavor of foods without the use of added salt, sugar, or fat; and improve the quality of the food they consume. By learning a set of core recipes which are easily assembled, quickly prepared, and affordable, patients are more likely to prepare more healthy foods at home. These workshops focus on convenient breakfasts, simple entres, side dishes, and desserts which can be prepared with minimal effort and are consistent with nutrition recommendations for cardiovascular risk reduction. Cooking [employer] are taught by a Armed forces logistics/support/administrative officer (RD) who has been trained by the [employer]. The chef or RD has a clear understanding of the importance of minimizing - if not completely eliminating - added fat, sugar, and sodium in recipes. Throughout the series of Cooking School Workshop sessions, patients will learn about healthy ingredients and efficient methods of cooking to build confidence in their capability to prepare    Cooking School weekly topics:  Adding Flavor- Sodium-Free  Fast and Healthy Breakfasts  Powerhouse Plant-Based Proteins  Satisfying Salads and Dressings  Simple Sides and Sauces  International Cuisine-Spotlight on the [employer] Zones  Delicious Desserts  Savory Soups  [employer] - Meals in a Astronomer Appetizers and  Snacks  Comforting Weekend Breakfasts  One-Pot Wonders   Fast Evening Meals  Landscape architect Your Pritikin Plate  WORKSHOPS   Healthy Mindset (Psychosocial):  Focused Goals, Sustainable Changes Clinical staff led group instruction and group discussion with PowerPoint presentation and patient guidebook. To enhance the learning environment the use of posters, models and videos may be added. Patients will be able to apply effective goal setting strategies to establish at least one personal goal, and then take consistent, meaningful action toward that goal. They will learn to identify common barriers to achieving personal goals and develop strategies to overcome them. Patients will also gain an understanding of how our mind-set can impact our ability to achieve goals and the importance of cultivating a positive and growth-oriented mind-set. The purpose of this lesson is to provide patients with a deeper understanding of how to set and achieve personal goals, as well as the tools and strategies needed to overcome common obstacles which may arise along the way.  From Head to Heart: The Power of a Healthy Outlook  Clinical staff led group instruction and group discussion with PowerPoint presentation and patient guidebook. To enhance the learning environment the use of posters, models and videos may be added. Patients will be able to recognize and describe the impact of emotions and mood on physical health. They will discover the importance of self-care and explore self-care practices which may work for them. Patients will also learn how to utilize the 4 C's to cultivate a healthier outlook and better manage stress and challenges. The purpose of this lesson is to demonstrate to patients how a healthy outlook is an essential part of maintaining good health, especially as they continue their cardiac rehab journey.  Healthy Sleep for a Healthy Heart Clinical staff led group instruction and  group discussion with PowerPoint presentation and patient guidebook. To enhance the learning environment the use of posters, models and videos may be added. At the conclusion of this workshop, patients will be able to demonstrate knowledge of the importance of sleep to overall health, well-being, and quality of life. They will understand the symptoms of, and treatments for, common sleep disorders. Patients will also be able to identify daytime and nighttime behaviors which impact sleep, and they will be able to apply these tools to help manage sleep-related challenges. The purpose of this lesson is to provide patients with a general overview of sleep and outline the importance of quality sleep. Patients will learn about a few of the most common sleep disorders. Patients will also be introduced to the concept of "sleep hygiene," and discover ways to self-manage certain sleeping problems through simple daily behavior changes. Finally, the workshop will motivate patients by clarifying the links between quality sleep and their goals of heart-healthy living.   Recognizing and Reducing Stress Clinical staff led group instruction and group discussion with PowerPoint presentation and patient guidebook. To enhance the learning environment the use of posters, models and videos may be added. At the conclusion of this workshop, patients will be able to understand the types of stress reactions, differentiate between acute and chronic stress, and recognize the impact that chronic stress has on their health. They will also be able to apply different coping mechanisms, such as reframing negative self-talk. Patients will have the opportunity to practice a variety of stress management techniques, such as deep abdominal breathing, progressive muscle relaxation, and/or guided imagery.  The purpose of this lesson is to educate patients on the role of stress in their lives and to provide healthy techniques for coping with  it.  Learning Barriers/Preferences:  Learning Barriers/Preferences - 09/19/22 1433       Learning Barriers/Preferences   Learning Barriers Sight   wears glasses   Learning Preferences Verbal Instruction;Computer/Internet;Audio;Written Material             Education Topics:  Knowledge Questionnaire Score:  Knowledge Questionnaire Score - 09/19/22 1433       Knowledge Questionnaire Score   Pre Score 20/24             Core Components/Risk Factors/Patient Goals at Admission:  Personal Goals and Risk Factors at Admission - 09/19/22 1434       Core Components/Risk Factors/Patient Goals on Admission    Weight Management Weight Gain             Core Components/Risk Factors/Patient Goals Review:   Goals and Risk Factor Review     Row Name 09/23/22 1625 10/02/22 1207 10/24/22 1653         Core Components/Risk Factors/Patient Goals Review  Personal Goals Review Weight Management/Obesity Weight Management/Obesity Weight Management/Obesity     Review Vanessa Waller started intensive cardiac rehab on 09/23/22 and did well with exercise for her fitness level. Vital signs stable. Vanessa Waller did dicuss weight gain with RD Sam today Vanessa Waller started intensive cardiac rehab on 09/23/22 and is off to a good start to exercise.. Vital signs have been  stable. Vanessa Waller is doing well with exercise at  intensive cardiac rehab.. Vital signs have been  stable.     Expected Outcomes Vanessa Waller will continue to participate in intensive cardiac rehab for exercise, nutrition and lifesyle modifications Vanessa Waller will continue to participate in intensive cardiac rehab for exercise, nutrition and lifesyle modifications Vanessa Waller will continue to participate in intensive cardiac rehab for exercise, nutrition and lifesyle modifications              Core Components/Risk Factors/Patient Goals at Discharge (Final Review):   Goals and Risk Factor Review - 10/24/22 1653       Core Components/Risk Factors/Patient  Goals Review   Personal Goals Review Weight Management/Obesity    Review Vanessa Waller is doing well with exercise at  intensive cardiac rehab.. Vital signs have been  stable.    Expected Outcomes Vanessa Waller will continue to participate in intensive cardiac rehab for exercise, nutrition and lifesyle modifications             ITP Comments:  ITP Comments     Row Name 09/19/22 1121 09/23/22 1623 10/02/22 1201 10/24/22 1651     ITP Comments Dr. [name] medical director. Introduction to pritikin education program/ intensive cardiac rehab. Initial orientation packet reviewed with patient. 30 Day ITP Review. Deari started intensive cardiac rehab on 09/23/22 and did well with exercise for her fitness level. Denicia is somewhat deconditioned 30 Day ITP Review. Kameo started intensive cardiac rehab on 09/23/22 and is off to a good start to exercise 30 Day ITP Review. Myles has good attendance and participation in  intensive cardiac rehab             Comments: See ITP notes.

## 2022-10-25 ENCOUNTER — Encounter (HOSPITAL_COMMUNITY)
Admission: RE | Admit: 2022-10-25 | Discharge: 2022-10-25 | Disposition: A | Discharge status: Home / Self Care | Payer: Medicare Other | Source: Ambulatory Visit | Attending: Cardiology | Admitting: Cardiology

## 2022-10-25 DIAGNOSIS — Z954 Presence of other heart-valve replacement: Secondary | ICD-10-CM

## 2022-10-25 DIAGNOSIS — Z952 Presence of prosthetic heart valve: Secondary | ICD-10-CM | POA: Diagnosis not present

## 2022-10-28 ENCOUNTER — Encounter (HOSPITAL_COMMUNITY): Payer: Medicare Other

## 2022-10-30 ENCOUNTER — Encounter (HOSPITAL_COMMUNITY)
Admission: RE | Admit: 2022-10-30 | Discharge: 2022-10-30 | Disposition: A | Discharge status: Home / Self Care | Payer: Medicare Other | Source: Ambulatory Visit | Attending: Cardiology | Admitting: Cardiology

## 2022-10-30 DIAGNOSIS — Z954 Presence of other heart-valve replacement: Secondary | ICD-10-CM

## 2022-10-30 DIAGNOSIS — Z952 Presence of prosthetic heart valve: Secondary | ICD-10-CM | POA: Diagnosis not present

## 2022-11-01 ENCOUNTER — Encounter (HOSPITAL_COMMUNITY)
Admission: RE | Admit: 2022-11-01 | Discharge: 2022-11-01 | Disposition: A | Discharge status: Home / Self Care | Payer: Medicare Other | Source: Ambulatory Visit | Attending: Cardiology | Admitting: Cardiology

## 2022-11-01 DIAGNOSIS — Z954 Presence of other heart-valve replacement: Secondary | ICD-10-CM

## 2022-11-01 DIAGNOSIS — Z952 Presence of prosthetic heart valve: Secondary | ICD-10-CM

## 2022-11-04 ENCOUNTER — Encounter (HOSPITAL_COMMUNITY)
Admission: RE | Admit: 2022-11-04 | Discharge: 2022-11-04 | Disposition: A | Discharge status: Home / Self Care | Payer: Medicare Other | Source: Ambulatory Visit | Attending: Cardiology | Admitting: Cardiology

## 2022-11-04 DIAGNOSIS — Z952 Presence of prosthetic heart valve: Secondary | ICD-10-CM

## 2022-11-04 DIAGNOSIS — Z954 Presence of other heart-valve replacement: Secondary | ICD-10-CM

## 2022-11-06 ENCOUNTER — Encounter (HOSPITAL_COMMUNITY): Payer: Medicare Other

## 2022-11-08 ENCOUNTER — Encounter (HOSPITAL_COMMUNITY)
Admission: RE | Admit: 2022-11-08 | Discharge: 2022-11-08 | Disposition: A | Discharge status: Home / Self Care | Payer: Medicare Other | Source: Ambulatory Visit | Attending: Cardiology | Admitting: Cardiology

## 2022-11-08 DIAGNOSIS — Z954 Presence of other heart-valve replacement: Secondary | ICD-10-CM

## 2022-11-08 DIAGNOSIS — Z952 Presence of prosthetic heart valve: Secondary | ICD-10-CM | POA: Diagnosis not present

## 2022-11-11 ENCOUNTER — Encounter (HOSPITAL_COMMUNITY)
Admission: RE | Admit: 2022-11-11 | Discharge: 2022-11-11 | Disposition: A | Discharge status: Home / Self Care | Payer: Medicare Other | Source: Ambulatory Visit | Attending: Cardiology | Admitting: Cardiology

## 2022-11-11 DIAGNOSIS — Z954 Presence of other heart-valve replacement: Secondary | ICD-10-CM

## 2022-11-11 DIAGNOSIS — Z952 Presence of prosthetic heart valve: Secondary | ICD-10-CM | POA: Diagnosis not present

## 2022-11-13 ENCOUNTER — Encounter (HOSPITAL_COMMUNITY): Payer: Medicare Other

## 2022-11-15 ENCOUNTER — Encounter (HOSPITAL_COMMUNITY)
Admission: RE | Admit: 2022-11-15 | Discharge: 2022-11-15 | Disposition: A | Discharge status: Home / Self Care | Payer: Medicare Other | Source: Ambulatory Visit | Attending: Cardiology | Admitting: Cardiology

## 2022-11-15 DIAGNOSIS — Z954 Presence of other heart-valve replacement: Secondary | ICD-10-CM | POA: Insufficient documentation

## 2022-11-15 DIAGNOSIS — Z952 Presence of prosthetic heart valve: Secondary | ICD-10-CM | POA: Insufficient documentation

## 2022-11-18 ENCOUNTER — Encounter (HOSPITAL_COMMUNITY)
Admission: RE | Admit: 2022-11-18 | Discharge: 2022-11-18 | Disposition: A | Discharge status: Home / Self Care | Payer: Medicare Other | Source: Ambulatory Visit | Attending: Cardiology | Admitting: Cardiology

## 2022-11-18 DIAGNOSIS — Z952 Presence of prosthetic heart valve: Secondary | ICD-10-CM | POA: Diagnosis not present

## 2022-11-18 DIAGNOSIS — Z954 Presence of other heart-valve replacement: Secondary | ICD-10-CM

## 2022-11-19 NOTE — Progress Notes (Signed)
Cardiac Individual Treatment Plan  Patient Details  Name: Vanessa Waller MRN: [id number] Date of Birth: 14-Mar-1956 Referring Provider:   Flowsheet Row INTENSIVE CARDIAC REHAB ORIENT from 09/19/2022 in [facility] [place] [facility] Heart, Vascular, & Lung Health  Referring Provider Dr. Regino Schultze ([name], MD covering)       Initial Encounter Date:  Flowsheet Row INTENSIVE CARDIAC REHAB ORIENT from 09/19/2022 in [facility] for Heart, Vascular, & Lung Health  Date 09/19/22       Visit Diagnosis: No diagnosis found.  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)    liver,mesentery,pelvis   Nonspecific elevation of levels of transaminase or lactic acid dehydrogenase (LDH)    Small bowel obstruction (HCC) 2007   Uterine fibroid     Tobacco Use: Social History   Tobacco Use  Smoking Status Never  Smokeless Tobacco Never    Labs: Review Flowsheet       Latest Ref Rng & Units 11/30/2012 07/11/2015 01/16/2018  Labs for ITP Cardiac and Pulmonary Rehab  Cholestrol 125 - 200 mg/dL 657  846  -  LDL (calc) <130 mg/dL 82  88  -  HDL-C >=96 mg/dL 50  48  -  Trlycerides <150 mg/dL 295  284  -  TCO2 22 - 32 mmol/L - - 28     Capillary Blood Glucose: Lab Results  Component Value Date   GLUCAP 141 (H) 07/03/2012   GLUCAP 94 07/01/2012     Exercise Target Goals: Exercise Program Goal: Individual exercise  prescription set using results from initial 6 min walk test and THRR while considering  patient's activity barriers and safety.   Exercise Prescription Goal: Initial exercise prescription builds to 30-45 minutes Vanessa Waller day of aerobic activity, 2-3 days per week.  Home exercise guidelines will be given to patient during program as part of exercise prescription that the participant will acknowledge.  Activity Barriers & Risk Stratification:  Activity Barriers & Cardiac Risk Stratification - 09/19/22 1440       Activity Barriers & Cardiac Risk Stratification   Activity Barriers Deconditioning;Muscular Weakness;Shortness of Breath;Other (comment)    Comments Sternal Precautions    Cardiac Risk Stratification High             6 Minute Walk:  6 Minute Walk     Row Name 09/19/22 1134         6 Minute Walk   Phase Initial     Distance 1369 feet     Walk Time 6 minutes     # of Rest Breaks 0     MPH 2.6     METS 3.63     RPE 11     Perceived Dyspnea  1     VO2 Peak 12.72     Symptoms Yes (comment)     Comments SOB, RPD = 1  Resting HR 79 bpm     Resting BP 120/80     Resting Oxygen Saturation  100 %     Exercise Oxygen Saturation  during 6 min walk 99 %     Max Ex. HR 101 bpm     Max Ex. BP 128/70     2 Minute Post BP 122/78              Oxygen Initial Assessment:   Oxygen Re-Evaluation:   Oxygen Discharge (Final Oxygen Re-Evaluation):   Initial Exercise Prescription:  Initial Exercise Prescription - 09/19/22 1400       Date of Initial Exercise RX and Referring Provider   Date 09/19/22    Referring Provider Dr. Regino Schultze [name], MD covering)    Expected Discharge Date 11/29/22      Recumbant Bike   Level 1.5    RPM 60    Watts 25    Minutes 15    METs 3.6      NuStep   Level 2    SPM 75    Minutes 15    METs 3.6      Prescription Details   Frequency (times per week) 3    Duration Progress to 30 minutes of continuous aerobic without  signs/symptoms of physical distress      Intensity   THRR 40-80% of Max Heartrate 62 -124    Ratings of Perceived Exertion 11-13    Perceived Dyspnea 0-4      Progression   Progression Continue progressive overload as per policy without signs/symptoms or physical distress.      Resistance Training   Training Prescription Yes    Weight 2 lbs    Reps 10-15             Perform Capillary Blood Glucose checks as needed.  Exercise Prescription Changes:   Exercise Prescription Changes     Row Name 09/23/22 1400 10/16/22 1500 10/23/22 1500 11/18/22 1500       Response to Exercise   Blood Pressure (Admit) 108/72 110/70 130/80 122/76    Blood Pressure (Exercise) 130/60 138/86 142/82 118/64    Blood Pressure (Exit) 116/70 122/70 120/70 118/74    Heart Rate (Admit) 82 bpm 73 bpm 81 bpm 77 bpm    Heart Rate (Exercise) 87 bpm 82 bpm 81 bpm 83 bpm    Heart Rate (Exit) 78 bpm 73 bpm 71 bpm 74 bpm    Rating of Perceived Exertion (Exercise) 14 11 12 12     Symptoms None None None None    Comments Pt's first day in the CRP2 program Reviewed METs Reviewed METs and goals Reviewed home exercise Rx    Duration Continue with 30 min of aerobic exercise without signs/symptoms of physical distress. Continue with 30 min of aerobic exercise without signs/symptoms of physical distress. Continue with 30 min of aerobic exercise without signs/symptoms of physical distress. Continue with 30 min of aerobic exercise without signs/symptoms of physical distress.    Intensity THRR unchanged THRR unchanged THRR unchanged THRR unchanged      Progression   Progression Continue to progress workloads to maintain intensity without signs/symptoms of physical distress. Continue to progress workloads to maintain intensity without signs/symptoms of physical distress. Continue to progress workloads to maintain intensity without signs/symptoms of physical distress. Continue to progress workloads to maintain intensity  without signs/symptoms of physical distress.    Average METs 1.7 2.1 2.5 2.7      Resistance Training   Training Prescription  Yes No No Yes    Weight 1 lb No weights on wednesdays No weights on wednesdays 2 lbs    Reps 10-15 -- -- 10-15    Time 10 Minutes -- -- 10 Minutes      Interval Training   Interval Training -- No No No      Recumbant Bike   Level 1.5 1.5 1.5 1.5    RPM 34 40 -- 60    Watts 8 113 15 18    Minutes 15 15 15 15     METs 2.1 2.5 3.1 3.6      NuStep   Level -- 2 2 2     SPM 52 72 76 78    Minutes 15 15 15 15     METs 1.3 1.7 1.9 1.8      Home Exercise Plan   Plans to continue exercise at -- -- -- Home (comment)    Frequency -- -- -- Add 2 additional days to program exercise sessions.    Initial Home Exercises Provided -- -- -- 11/18/22             Exercise Comments:   Exercise Comments     Row Name 09/23/22 1414 10/16/22 1552 10/23/22 1500 11/18/22 1500     Exercise Comments Pt's first day in the CRP2 program. Pt's RPEs were 13-14. will continue to make adjustments as necessary to pt workout. Reviewed METs with patient. Pt is progressing her MET level with activity. Reviewed METs and goals. Pt also has goal of weight gain, pt has some weight gain which she is pleased with. Reviewed home exercise Rx with patient today. Pt is not currently doing any exercise outside of the CRP2 program. Pt was encouraged to begin walking 2x/week for 30 minutes to meet the 150 minutes of exercise per week goal. Pt states she and her sister paln to exercise together. I encouraged her to start that now. Pt verbalized understanding of the home exercise Rx and was provided Vanessa Waller copy.             Exercise Goals and Review:   Exercise Goals     Row Name 09/19/22 1441             Exercise Goals   Increase Physical Activity Yes       Intervention Provide advice, education, support and counseling about physical activity/exercise needs.;Develop an individualized exercise  prescription for aerobic and resistive training based on initial evaluation findings, risk stratification, comorbidities and participant's personal goals.       Expected Outcomes Short Term: Attend rehab on Vanessa Waller regular basis to increase amount of physical activity.;Long Term: Add in home exercise to make exercise part of routine and to increase amount of physical activity.;Long Term: Exercising regularly at least 3-5 days Vanessa Waller week.       Increase Strength and Stamina Yes       Intervention Provide advice, education, support and counseling about physical activity/exercise needs.;Develop an individualized exercise prescription for aerobic and resistive training based on initial evaluation findings, risk stratification, comorbidities and participant's personal goals.       Expected Outcomes Short Term: Increase workloads from initial exercise prescription for resistance, speed, and METs.;Short Term: Perform resistance training exercises routinely during rehab and add in resistance training at home;Long Term: Improve cardiorespiratory fitness, muscular endurance and strength as measured by increased METs and functional capacity ( )       Able to understand and use rate of perceived exertion (RPE) scale Yes  Intervention Provide education and explanation on how to use RPE scale       Expected Outcomes Short Term: Able to use RPE daily in rehab to express subjective intensity level;Long Term:  Able to use RPE to guide intensity level when exercising independently       Knowledge and understanding of Target Heart Rate Range (THRR) Yes       Intervention Provide education and explanation of THRR including how the numbers were predicted and where they are located for reference       Expected Outcomes Short Term: Able to state/look up THRR;Short Term: Able to use daily as guideline for intensity in rehab;Long Term: Able to use THRR to govern intensity when exercising independently       Understanding of  Exercise Prescription Yes       Intervention Provide education, explanation, and written materials on patient's individual exercise prescription       Expected Outcomes Short Term: Able to explain program exercise prescription;Long Term: Able to explain home exercise prescription to exercise independently                Exercise Goals Re-Evaluation :  Exercise Goals Re-Evaluation     Row Name 09/23/22 1412 10/23/22 1500           Exercise Goal Re-Evaluation   Exercise Goals Review Increase Physical Activity;Increase Strength and Stamina;Able to understand and use rate of perceived exertion (RPE) scale;Knowledge and understanding of Target Heart Rate Range (THRR);Understanding of Exercise Prescription Increase Physical Activity;Increase Strength and Stamina;Able to understand and use rate of perceived exertion (RPE) scale;Knowledge and understanding of Target Heart Rate Range (THRR);Understanding of Exercise Prescription      Comments Pt's first day in the CRP2 program. Pt understands the exercise Rx, THRR, RPE scale. Reviewed METs and goals. Pt is making slow, steady progress. Pt voices that she has increased her strength and stamina since starting the program.      Expected Outcomes Will continue to monitor the patient and progress exercise workloads as tolerated. Will continue to monitor the patient and progress exercise workloads as tolerated.               Discharge Exercise Prescription (Final Exercise Prescription Changes):  Exercise Prescription Changes - 11/18/22 1500       Response to Exercise   Blood Pressure (Admit) 122/76    Blood Pressure (Exercise) 118/64    Blood Pressure (Exit) 118/74    Heart Rate (Admit) 77 bpm    Heart Rate (Exercise) 83 bpm    Heart Rate (Exit) 74 bpm    Rating of Perceived Exertion (Exercise) 12    Symptoms None    Comments Reviewed home exercise Rx    Duration Continue with 30 min of aerobic exercise without signs/symptoms of physical  distress.    Intensity THRR unchanged      Progression   Progression Continue to progress workloads to maintain intensity without signs/symptoms of physical distress.    Average METs 2.7      Resistance Training   Training Prescription Yes    Weight 2 lbs    Reps 10-15    Time 10 Minutes      Interval Training   Interval Training No      Recumbant Bike   Level 1.5    RPM 60    Watts 18    Minutes 15    METs 3.6      NuStep   Level 2  SPM 78    Minutes 15    METs 1.8      Home Exercise Plan   Plans to continue exercise at Home (comment)    Frequency Add 2 additional days to program exercise sessions.    Initial Home Exercises Provided 11/18/22             Nutrition:  Target Goals: Understanding of nutrition guidelines, daily intake of sodium 1500mg , cholesterol 200mg , calories 30% from fat and 7% or less from saturated fats, daily to have 5 or more servings of fruits and vegetables.  Biometrics:  Pre Biometrics - 09/19/22 1045       Pre Biometrics   Waist Circumference 29.5 inches    Hip Circumference 35.5 inches    Waist to Hip Ratio 0.83 %    Triceps Skinfold 10 mm    % Body Fat 27.2 %    Grip Strength 22 kg    Flexibility 0 in   could not reach box   Single Leg Stand 30 seconds              Nutrition Therapy Plan and Nutrition Goals:  Nutrition Therapy & Goals - 11/18/22 1400       Nutrition Therapy   Diet General Healthful Diet    Protein (specify units) 62-77g/day (1.2-1.5g/kg IBW)      Personal Nutrition Goals   Nutrition Goal Patient to identify strategies for weight gain of 0.5-2.0# per week.    Personal Goal #2 Patient to improve diet quality by using the plate method as Vanessa Waller guide for meal planning to include lean protein/plant protein, fruits, vegetables, whole grains, nonfat dairy as part of Vanessa Waller well-balanced diet.    Comments Goals in action. Vanessa Waller is motivated to gain weight. She reports her lowest weight was ~90# and she is  uncertain of Vanessa Waller goal weight at this time. She is no longer living with her sister/brother in law but on her own. They do continue to support her and grocery shop for her. She reports normal appetite, eating three meals daily, and using Equate Nutritional Shake Plus on occasion (350kcals, 13g protein). Encouraged patient to increased Equate Nutritional Shake Plus to twice daily (700kcals, 26g protein) in additon to three meals and reviewed strategies for weight gain including increasing eating frequency and increasing carbohydrate portions. She has maintained her weight since starting with our program. Vanessa Waller will continue to benefit from participation in intensive cardiac rehab for nutrition, exercise, and lifestyle modification.      Intervention Plan   Intervention Prescribe, educate and counsel regarding individualized specific dietary modifications aiming towards targeted core components such as weight, hypertension, lipid management, diabetes, heart failure and other comorbidities.;Nutrition handout(s) given to patient.    Expected Outcomes Short Term Goal: Understand basic principles of dietary content, such as calories, fat, sodium, cholesterol and nutrients.;Long Term Goal: Adherence to prescribed nutrition plan.             Nutrition Assessments:  Nutrition Assessments - 09/23/22 1547       Rate Your Plate Scores   Pre Score 49            MEDIFICTS Score Key: ?70 Need to make dietary changes  40-70 Heart Healthy Diet ? 40 Therapeutic Level Cholesterol Diet   Flowsheet Row INTENSIVE CARDIAC REHAB from 09/23/2022 in [facility] for Heart, Vascular, & Lung Health  Picture Your Plate Total Score on Admission 49      Picture Your Plate Scores: <47  Unhealthy dietary pattern with much room for improvement. 41-50 Dietary pattern unlikely to meet recommendations for good health and room for improvement. 51-60 More healthful dietary pattern, with some room  for improvement.  >60 Healthy dietary pattern, although there may be some specific behaviors that could be improved.    Nutrition Goals Re-Evaluation:  Nutrition Goals Re-Evaluation     Row Name 09/23/22 1334 10/18/22 1203 11/18/22 1400         Goals   Current Weight 103 lb 2.8 oz (46.8 kg) 107 lb 12.9 oz (48.9 kg) 104 lb 8 oz (47.4 kg)     Comment IBW 51.25kg, current BMI 18.5, 1404-1638kcals/day (30-35kcals/kg actual body weight) -- No new labs at this time     Expected Outcome Vanessa Waller is motivated to gain weight. She reports her lowest weight was ~90# and she is uncertain of Vanessa Waller goal weight at this time. She is living with her sister while she is recovering from surgery. She reports normal appetite, eating three meals daily, and using Equate Nutritional Shake Plus on occasion (350kcals, 13g protein). Encouraged patient to increased Equate Nutritional Shake Plus to twice daily (700kcals, 26g protein) in additon to three meals. Vanessa Waller will continue to benefit from participation in intensive cardiac rehab for nutrition, exercise, and lifestyle modification. Goals in action. Xoe is motivated to gain weight. She reports her lowest weight was ~90# and she is uncertain of Vanessa Waller goal weight at this time. She is living with her sister/brother in law while she is recovering from surgery. They are an excellent support. She reports normal appetite, eating three meals daily, and using Equate Nutritional Shake Plus on occasion (350kcals, 13g protein). Encouraged patient to increased Equate Nutritional Shake Plus to twice daily (700kcals, 26g protein) in additon to three meals. She is up 3.5# since starting with our program. Rajinder will continue to benefit from participation in intensive cardiac rehab for nutrition, exercise, and lifestyle modification. Goals in action. Artesha is motivated to gain weight. She reports her lowest weight was ~90# and she is uncertain of Vanessa Waller goal weight at this time. She is no longer living  with her sister/brother in law but on her own. They do continue to support her and grocery shop for her. She reports normal appetite, eating three meals daily, and using Equate Nutritional Shake Plus on occasion (350kcals, 13g protein). Encouraged patient to increased Equate Nutritional Shake Plus to twice daily (700kcals, 26g protein) in additon to three meals and reviewed strategies for weight gain including increasing eating frequency and increasing carbohydrate portions. She has maintained her weight since starting with our program. Shaquilla will continue to benefit from participation in intensive cardiac rehab for nutrition, exercise, and lifestyle modification.              Nutrition Goals Re-Evaluation:  Nutrition Goals Re-Evaluation     Row Name 09/23/22 1334 10/18/22 1203 11/18/22 1400         Goals   Current Weight 103 lb 2.8 oz (46.8 kg) 107 lb 12.9 oz (48.9 kg) 104 lb 8 oz (47.4 kg)     Comment IBW 51.25kg, current BMI 18.5, 1404-1638kcals/day (30-35kcals/kg actual body weight) -- No new labs at this time     Expected Outcome Vanessa Waller is motivated to gain weight. She reports her lowest weight was ~90# and she is uncertain of Vanessa Waller goal weight at this time. She is living with her sister while she is recovering from surgery. She reports normal appetite, eating three meals daily, and using  Equate Nutritional Shake Plus on occasion (350kcals, 13g protein). Encouraged patient to increased Equate Nutritional Shake Plus to twice daily (700kcals, 26g protein) in additon to three meals. Vanessa Waller will continue to benefit from participation in intensive cardiac rehab for nutrition, exercise, and lifestyle modification. Goals in action. Vanessa Waller is motivated to gain weight. She reports her lowest weight was ~90# and she is uncertain of Vanessa Waller goal weight at this time. She is living with her sister/brother in law while she is recovering from surgery. They are an excellent support. She reports normal appetite, eating  three meals daily, and using Equate Nutritional Shake Plus on occasion (350kcals, 13g protein). Encouraged patient to increased Equate Nutritional Shake Plus to twice daily (700kcals, 26g protein) in additon to three meals. She is up 3.5# since starting with our program. Vanessa Waller will continue to benefit from participation in intensive cardiac rehab for nutrition, exercise, and lifestyle modification. Goals in action. Vanessa Waller is motivated to gain weight. She reports her lowest weight was ~90# and she is uncertain of Vanessa Waller goal weight at this time. She is no longer living with her sister/brother in law but on her own. They do continue to support her and grocery shop for her. She reports normal appetite, eating three meals daily, and using Equate Nutritional Shake Plus on occasion (350kcals, 13g protein). Encouraged patient to increased Equate Nutritional Shake Plus to twice daily (700kcals, 26g protein) in additon to three meals and reviewed strategies for weight gain including increasing eating frequency and increasing carbohydrate portions. She has maintained her weight since starting with our program. Vanessa Waller will continue to benefit from participation in intensive cardiac rehab for nutrition, exercise, and lifestyle modification.              Nutrition Goals Discharge (Final Nutrition Goals Re-Evaluation):  Nutrition Goals Re-Evaluation - 11/18/22 1400       Goals   Current Weight 104 lb 8 oz (47.4 kg)    Comment No new labs at this time    Expected Outcome Goals in action. Vanessa Waller is motivated to gain weight. She reports her lowest weight was ~90# and she is uncertain of Vanessa Waller goal weight at this time. She is no longer living with her sister/brother in law but on her own. They do continue to support her and grocery shop for her. She reports normal appetite, eating three meals daily, and using Equate Nutritional Shake Plus on occasion (350kcals, 13g protein). Encouraged patient to increased Equate Nutritional  Shake Plus to twice daily (700kcals, 26g protein) in additon to three meals and reviewed strategies for weight gain including increasing eating frequency and increasing carbohydrate portions. She has maintained her weight since starting with our program. Vanessa Waller will continue to benefit from participation in intensive cardiac rehab for nutrition, exercise, and lifestyle modification.             Psychosocial: Target Goals: Acknowledge presence or absence of significant depression and/or stress, maximize coping skills, provide positive support system. Participant is able to verbalize types and ability to use techniques and skills needed for reducing stress and depression.  Initial Review & Psychosocial Screening:  Initial Psych Review & Screening - 09/19/22 1428       Initial Review   Current issues with None Identified      Family Dynamics   Good Support System? Yes    Comments Pt has her sister and currently lives with her s/p surgery. Pt also voices having other family members for support.  Barriers   Psychosocial barriers to participate in program There are no identifiable barriers or psychosocial needs.      Screening Interventions   Interventions Encouraged to exercise             Quality of Life Scores:  Quality of Life - 09/19/22 1432       Quality of Life   Select Quality of Life      Quality of Life Scores   Health/Function Pre 20.43 %    Socioeconomic Pre 20.36 %    Psych/Spiritual Pre 22.5 %    Family Pre 19.17 %    GLOBAL Pre 20.69 %            Scores of 19 and below usually indicate Vanessa Waller poorer quality of life in these areas.  Vanessa Waller difference of  2-3 points is Vanessa Waller clinically meaningful difference.  Vanessa Waller difference of 2-3 points in the total score of the Quality of Life Index has been associated with significant improvement in overall quality of life, self-image, physical symptoms, and general health in studies assessing change in quality of  life.  PHQ-9: Review Flowsheet       09/19/2022  Depression screen PHQ 2/9  Decreased Interest 0  Down, Depressed, Hopeless 0  PHQ - 2 Score 0  Altered sleeping 0  Tired, decreased energy 0  Change in appetite 0  Feeling bad or failure about yourself  0  Trouble concentrating 0  Moving slowly or fidgety/restless 0  Suicidal thoughts 0  PHQ-9 Score 0  Difficult doing work/chores Not difficult at all   Interpretation of Total Score  Total Score Depression Severity:  1-4 = Minimal depression, 5-9 = Mild depression, 10-14 = Moderate depression, 15-19 = Moderately severe depression, 20-27 = Severe depression   Psychosocial Evaluation and Intervention:   Psychosocial Re-Evaluation:  Psychosocial Re-Evaluation     Row Name 09/23/22 1625 10/02/22 1204 10/24/22 1652         Psychosocial Re-Evaluation   Current issues with None Identified None Identified None Identified     Comments -- Vanessa Waller recently moved back home and is driving now --     Interventions Encouraged to attend Cardiac Rehabilitation for the exercise Encouraged to attend Cardiac Rehabilitation for the exercise Encouraged to attend Cardiac Rehabilitation for the exercise     Continue Psychosocial Services  No Follow up required No Follow up required No Follow up required              Psychosocial Discharge (Final Psychosocial Re-Evaluation):  Psychosocial Re-Evaluation - 10/24/22 1652       Psychosocial Re-Evaluation   Current issues with None Identified    Comments --    Interventions Encouraged to attend Cardiac Rehabilitation for the exercise    Continue Psychosocial Services  No Follow up required             Vocational Rehabilitation: Provide vocational rehab assistance to qualifying candidates.   Vocational Rehab Evaluation & Intervention:  Vocational Rehab - 09/19/22 1433       Initial Vocational Rehab Evaluation & Intervention   Assessment shows need for Vocational Rehabilitation No       Vocational Rehab Re-Evaulation   Comments Pt is retired and has no needs at this time             Education: Education Goals: Education classes will be provided on Vanessa Waller weekly basis, covering required topics. Participant will state understanding/return demonstration of topics presented.    Education  Row Name 09/23/22 1400     Education   Cardiac Education Topics Pritikin   Geographical information systems officer Exercise   Exercise Workshop Location manager and Fall Prevention   Instruction Review Code 1- Verbalizes Understanding   Class Start Time 1145   Class Stop Time 1230   Class Time Calculation (min) 45 min    Row Name 09/25/22 1300     Education   Cardiac Education Topics Pritikin   Customer service manager   Weekly Topic Fast and Healthy Breakfasts   Instruction Review Code 1- Verbalizes Understanding   Class Start Time 1145   Class Stop Time 1226   Class Time Calculation (min) 41 min    Row Name 09/27/22 1200     Education   Cardiac Education Topics Pritikin   Licensed conveyancer Nutrition   Nutrition Overview of the Pritikin Eating Plan   Instruction Review Code 1- Verbalizes Understanding   Class Start Time 1150   Class Stop Time 1230   Class Time Calculation (min) 40 min    Row Name 09/30/22 1700     Education   Cardiac Education Topics Pritikin   Select Workshops     Workshops   Educator Exercise Physiologist   Select Psychosocial   Psychosocial Workshop Recognizing and Reducing Stress   Instruction Review Code 1- Verbalizes Understanding   Class Start Time 1405   Class Stop Time 1454   Class Time Calculation (min) 49 min    Row Name 10/02/22 1200     Education   Cardiac Education Topics Pritikin   Customer service manager   Weekly Topic Personalizing Your Pritikin  Plate   Instruction Review Code 1- Verbalizes Understanding   Class Start Time 1140   Class Stop Time 1220   Class Time Calculation (min) 40 min    Row Name 10/04/22 1400     Education   Cardiac Education Topics Pritikin   Nurse, children's Exercise Physiologist   Select Psychosocial   Psychosocial Healthy Minds, Bodies, Hearts   Instruction Review Code 1- Verbalizes Understanding   Class Start Time 1151   Class Stop Time 1230   Class Time Calculation (min) 39 min    Row Name 10/07/22 1300     Education   Cardiac Education Topics Pritikin   Glass blower/designer Nutrition   Nutrition Workshop Label Reading   Instruction Review Code 1- Tax inspector   Class Start Time 1150   Class Stop Time 1233   Class Time Calculation (min) 43 min    Row Name 10/11/22 1200     Education   Cardiac Education Topics Pritikin   Licensed conveyancer Nutrition   Nutrition Other   Instruction Review Code 1- Verbalizes Understanding   Class Start Time 1150   Class Stop Time 1226   Class Time Calculation (min) 36 min    Row Name 10/14/22 1300     Education   Cardiac Education Topics Pritikin   Public librarian  Select Exercise   Exercise Workshop Exercise Basics: Diplomatic Services operational officer   Instruction Review Code 1- Verbalizes Understanding   Class Start Time 1150   Class Stop Time 1235   Class Time Calculation (min) 45 min    Row Name 10/16/22 1500     Education   Cardiac Education Topics Pritikin   Secondary school teacher School   Educator Dietitian   Weekly Topic Tasty Appetizers and Snacks   Instruction Review Code 1- Verbalizes Understanding   Class Start Time 1145   Class Stop Time 1223   Class Time Calculation (min) 38 min    Row Name 10/18/22 1300     Education   Cardiac  Education Topics Pritikin   Nurse, children's   Educator Dietitian   Select Nutrition   Nutrition Calorie Density   Instruction Review Code 1- Verbalizes Understanding   Class Start Time 1145   Class Stop Time 1225   Class Time Calculation (min) 40 min    Row Name 10/21/22 1400     Education   Cardiac Education Topics Pritikin   Nurse, children's   Educator Dietitian   Select Nutrition   Nutrition Nutrition Action Plan   Instruction Review Code 1- Verbalizes Understanding   Class Start Time 1145   Class Stop Time 1219   Class Time Calculation (min) 34 min    Row Name 10/23/22 1300     Education   Cardiac Education Topics Pritikin   Customer service manager   Weekly Topic Efficiency Cooking - Meals in Vanessa Waller Snap   Instruction Review Code 1- Verbalizes Understanding   Class Start Time 1140   Class Stop Time 1220   Class Time Calculation (min) 40 min    Row Name 10/25/22 1400     Education   Cardiac Education Topics Pritikin   Psychologist, forensic Exercise Education   Exercise Education Move It!   Instruction Review Code 1- Verbalizes Understanding   Class Start Time 1150   Class Stop Time 1230   Class Time Calculation (min) 40 min    Row Name 10/30/22 1300     Education   Cardiac Education Topics Pritikin   Orthoptist   Educator Dietitian   Weekly Topic One-Pot Wonders   Instruction Review Code 1- Verbalizes Understanding   Class Start Time 1140   Class Stop Time 1212   Class Time Calculation (min) 32 min    Row Name 11/01/22 1400     Education   Cardiac Education Topics Pritikin   Psychologist, forensic General Education   General Education Hypertension and Heart Disease   Instruction Review Code 1- Verbalizes Understanding   Class  Start Time 1147   Class Stop Time 1220   Class Time Calculation (min) 33 min    Row Name 11/04/22 1300     Education   Cardiac Education Topics Pritikin   Writer Exercise Education   Exercise Education Biomechanial Limitations   Instruction Review Code 1- Verbalizes Understanding   Class Start Time 1156   Class  Stop Time 1232   Class Time Calculation (min) 36 min    Row Name 11/08/22 1400     Education   Cardiac Education Topics Pritikin   Select Core Videos     Core Videos   Educator Dietitian   Nutrition Dining Out - Part 1   Instruction Review Code 1- Verbalizes Understanding   Class Start Time 1145   Class Stop Time 1222   Class Time Calculation (min) 37 min    Row Name 11/11/22 1400     Education   Cardiac Education Topics Pritikin   Select Core Videos     Core Videos   Educator Dietitian   Nutrition Facts on Fat   Instruction Review Code 1- Verbalizes Understanding   Class Start Time 1145   Class Stop Time 1225   Class Time Calculation (min) 40 min    Row Name 11/15/22 1300     Education   Cardiac Education Topics Pritikin   Nurse, children's   Educator Dietitian   Nutrition Vitamins and Minerals   Instruction Review Code 1- Verbalizes Understanding   Class Start Time 1140   Class Stop Time 1225   Class Time Calculation (min) 45 min    Row Name 11/18/22 1700     Education   Cardiac Education Topics Pritikin   Geographical information systems officer Psychosocial   Psychosocial Workshop Healthy Sleep for Vanessa Waller Healthy Heart   Instruction Review Code 1- Verbalizes Understanding   Class Start Time 1145   Class Stop Time 1233   Class Time Calculation (min) 48 min            Core Videos: Exercise    Move It!  Clinical staff conducted group or individual video education with verbal and written material and guidebook.  Patient learns  the recommended Pritikin exercise program. Exercise with the goal of living Vanessa Waller long, healthy life. Some of the health benefits of exercise include controlled diabetes, healthier blood pressure levels, improved cholesterol levels, improved heart and lung capacity, improved sleep, and better body composition. Everyone should speak with their doctor before starting or changing an exercise routine.  Biomechanical Limitations Clinical staff conducted group or individual video education with verbal and written material and guidebook.  Patient learns how biomechanical limitations can impact exercise and how we can mitigate and possibly overcome limitations to have an impactful and balanced exercise routine.  Body Composition Clinical staff conducted group or individual video education with verbal and written material and guidebook.  Patient learns that body composition (ratio of muscle mass to fat mass) is Vanessa Waller key component to assessing overall fitness, rather than body weight alone. Increased fat mass, especially visceral belly fat, can put [place] at increased risk for metabolic syndrome, type 2 diabetes, heart disease, and even death. It is recommended to combine diet and exercise (cardiovascular and resistance training) to improve your body composition. Seek guidance from your physician and exercise physiologist before implementing an exercise routine.  Exercise Action Plan Clinical staff conducted group or individual video education with verbal and written material and guidebook.  Patient learns the recommended strategies to achieve and enjoy long-term exercise adherence, including variety, self-motivation, self-efficacy, and positive decision making. Benefits of exercise include fitness, good health, weight management, more energy, better sleep, less stress, and overall well-being.  Medical   Heart Disease Risk Reduction Clinical staff conducted group or individual video education with  verbal and written  material and guidebook.  Patient learns our heart is our most vital organ as it circulates oxygen, nutrients, white blood cells, and hormones throughout the entire body, and carries waste away. Data supports Vanessa Waller plant-based eating plan like the Pritikin Program for its effectiveness in slowing progression of and reversing heart disease. The video provides Vanessa Waller number of recommendations to address heart disease.   Metabolic Syndrome and Belly Fat  Clinical staff conducted group or individual video education with verbal and written material and guidebook.  Patient learns what metabolic syndrome is, how it leads to heart disease, and how one can reverse it and keep it from coming back. You have metabolic syndrome if you have 3 of the following 5 criteria: abdominal obesity, high blood pressure, high triglycerides, low HDL cholesterol, and high blood sugar.  Hypertension and Heart Disease Clinical staff conducted group or individual video education with verbal and written material and guidebook.  Patient learns that high blood pressure, or hypertension, is very common in the [place]. Hypertension is largely due to excessive salt intake, but other important risk factors include being overweight, physical inactivity, drinking too much alcohol, smoking, and not eating enough potassium from fruits and vegetables. High blood pressure is Vanessa Waller leading risk factor for heart attack, stroke, congestive heart failure, dementia, kidney failure, and premature death. Long-term effects of excessive salt intake include stiffening of the arteries and thickening of heart muscle and organ damage. Recommendations include ways to reduce hypertension and the risk of heart disease.  Diseases of Our Time - Focusing on Diabetes Clinical staff conducted group or individual video education with verbal and written material and guidebook.  Patient learns why the best way to stop diseases of our time is prevention, through food and other  lifestyle changes. Medicine (such as prescription pills and surgeries) is often only Vanessa Waller Band-Aid on the problem, not Vanessa Waller long-term solution. Most common diseases of our time include obesity, type 2 diabetes, hypertension, heart disease, and cancer. The Pritikin Program is recommended and has been proven to help reduce, reverse, and/or prevent the damaging effects of metabolic syndrome.  Nutrition   Overview of the Pritikin Eating Plan  Clinical staff conducted group or individual video education with verbal and written material and guidebook.  Patient learns about the Pritikin Eating Plan for disease risk reduction. The Pritikin Eating Plan emphasizes Vanessa Waller wide variety of unrefined, minimally-processed carbohydrates, like fruits, vegetables, whole grains, and legumes. Go, Caution, and Stop food choices are explained. Plant-based and lean animal proteins are emphasized. Rationale provided for low sodium intake for blood pressure control, low added sugars for blood sugar stabilization, and low added fats and oils for coronary artery disease risk reduction and weight management.  Calorie Density  Clinical staff conducted group or individual video education with verbal and written material and guidebook.  Patient learns about calorie density and how it impacts the Pritikin Eating Plan. Knowing the characteristics of the food you choose will help you decide whether those foods will lead to weight gain or weight loss, and whether you want to consume more or less of them. Weight loss is usually Vanessa Waller side effect of the Pritikin Eating Plan because of its focus on low calorie-dense foods.  Label Reading  Clinical staff conducted group or individual video education with verbal and written material and guidebook.  Patient learns about the Pritikin recommended label reading guidelines and corresponding recommendations regarding calorie density, added sugars, sodium content, and whole grains.  Dining Out -  Part 1   Clinical staff conducted group or individual video education with verbal and written material and guidebook.  Patient learns that restaurant meals can be sabotaging because they can be so high in calories, fat, sodium, and/or sugar. Patient learns recommended strategies on how to positively address this and avoid unhealthy pitfalls.  Facts on Fats  Clinical staff conducted group or individual video education with verbal and written material and guidebook.  Patient learns that lifestyle modifications can be just as effective, if not more so, as many medications for lowering your risk of heart disease. Vanessa Waller Pritikin lifestyle can help to reduce your risk of inflammation and atherosclerosis (cholesterol build-up, or plaque, in the artery walls). Lifestyle interventions such as dietary choices and physical activity address the cause of atherosclerosis. Vanessa Waller review of the types of fats and their impact on blood cholesterol levels, along with dietary recommendations to reduce fat intake is also included.  Nutrition Action Plan  Clinical staff conducted group or individual video education with verbal and written material and guidebook.  Patient learns how to incorporate Pritikin recommendations into their lifestyle. Recommendations include planning and keeping personal health goals in mind as an important part of their success.  Healthy Mind-Set    Healthy Minds, Bodies, Hearts  Clinical staff conducted group or individual video education with verbal and written material and guidebook.  Patient learns how to identify when they are stressed. Video will discuss the impact of that stress, as well as the many benefits of stress management. Patient will also be introduced to stress management techniques. The way we think, act, and feel has an impact on our hearts.  How Our Thoughts Can Heal Our Hearts  Clinical staff conducted group or individual video education with verbal and written material and guidebook.   Patient learns that negative thoughts can cause depression and anxiety. This can result in negative lifestyle behavior and serious health problems. Cognitive behavioral therapy is an effective method to help control our thoughts in order to change and improve our emotional outlook.  Additional Videos:  Exercise    Improving Performance  Clinical staff conducted group or individual video education with verbal and written material and guidebook.  Patient learns to use Vanessa Waller non-linear approach by alternating intensity levels and lengths of time spent exercising to help burn more calories and lose more body fat. Cardiovascular exercise helps improve heart health, metabolism, hormonal balance, blood sugar control, and recovery from fatigue. Resistance training improves strength, endurance, balance, coordination, reaction time, metabolism, and muscle mass. Flexibility exercise improves circulation, posture, and balance. Seek guidance from your physician and exercise physiologist before implementing an exercise routine and learn your capabilities and proper form for all exercise.  Introduction to Yoga  Clinical staff conducted group or individual video education with verbal and written material and guidebook.  Patient learns about yoga, Vanessa Waller discipline of the coming together of mind, breath, and body. The benefits of yoga include improved flexibility, improved range of motion, better posture and core strength, increased lung function, weight loss, and positive self-image. Yoga's heart health benefits include lowered blood pressure, healthier heart rate, decreased cholesterol and triglyceride levels, improved immune function, and reduced stress. Seek guidance from your physician and exercise physiologist before implementing an exercise routine and learn your capabilities and proper form for all exercise.  Medical   Aging: Enhancing Your Quality of Life  Clinical staff conducted group or individual video education  with verbal and written material and guidebook.  Patient learns key strategies  and recommendations to stay in good physical health and enhance quality of life, such as prevention strategies, having an advocate, securing Vanessa Waller [place], and keeping Vanessa Waller list of medications and system for tracking them. It also discusses how to avoid risk for bone loss.  Biology of Weight Control  Clinical staff conducted group or individual video education with verbal and written material and guidebook.  Patient learns that weight gain occurs because we consume more calories than we burn (eating more, moving less). Even if your body weight is normal, you may have higher ratios of fat compared to muscle mass. Too much body fat puts you at increased risk for cardiovascular disease, heart attack, stroke, type 2 diabetes, and obesity-related cancers. In addition to exercise, following the [place] can help reduce your risk.  Decoding Lab Results  Clinical staff conducted group or individual video education with verbal and written material and guidebook.  Patient learns that lab test reflects one measurement whose values change over time and are influenced by many factors, including medication, stress, sleep, exercise, food, hydration, pre-existing medical conditions, and more. It is recommended to use the knowledge from this video to become more involved with your lab results and evaluate your numbers to speak with your doctor.   Diseases of Our Time - Overview  Clinical staff conducted group or individual video education with verbal and written material and guidebook.  Patient learns that according to the CDC, 50% to 70% of chronic diseases (such as obesity, type 2 diabetes, elevated lipids, hypertension, and heart disease) are avoidable through lifestyle improvements including healthier food choices, listening to satiety cues, and increased physical activity.  Sleep  Disorders Clinical staff conducted group or individual video education with verbal and written material and guidebook.  Patient learns how good quality and duration of sleep are important to overall health and well-being. Patient also learns about sleep disorders and how they impact health along with recommendations to address them, including discussing with Vanessa Waller physician.  Nutrition  Dining Out - Part 2 Clinical staff conducted group or individual video education with verbal and written material and guidebook.  Patient learns how to plan ahead and communicate in order to maximize their dining experience in Vanessa Waller healthy and nutritious manner. Included are recommended food choices based on the type of restaurant the patient is visiting.   Fueling Vanessa Waller Best boy conducted group or individual video education with verbal and written material and guidebook.  There is Vanessa Waller strong connection between our food choices and our health. Diseases like obesity and type 2 diabetes are very prevalent and are in large-part due to lifestyle choices. The Pritikin Eating Plan provides plenty of food and hunger-curbing satisfaction. It is easy to follow, affordable, and helps reduce health risks.  Menu Workshop  Clinical staff conducted group or individual video education with verbal and written material and guidebook.  Patient learns that restaurant meals can sabotage health goals because they are often packed with calories, fat, sodium, and sugar. Recommendations include strategies to plan ahead and to communicate with the manager, chef, or server to help order Vanessa Waller healthier meal.  Planning Your Eating Strategy  Clinical staff conducted group or individual video education with verbal and written material and guidebook.  Patient learns about the [place] and its benefit of reducing the risk of disease. The [facility] does not focus on calories. Instead, it emphasizes high-quality,  nutrient-rich foods. By knowing the  characteristics of the foods, we choose, we can determine their calorie density and make informed decisions.  Targeting Your Nutrition Priorities  Clinical staff conducted group or individual video education with verbal and written material and guidebook.  Patient learns that lifestyle habits have Vanessa Waller tremendous impact on disease risk and progression. This video provides eating and physical activity recommendations based on your personal health goals, such as reducing LDL cholesterol, losing weight, preventing or controlling type 2 diabetes, and reducing high blood pressure.  Vitamins and Minerals  Clinical staff conducted group or individual video education with verbal and written material and guidebook.  Patient learns different ways to obtain key vitamins and minerals, including through Vanessa Waller recommended healthy diet. It is important to discuss all supplements you take with your doctor.   Healthy Mind-Set    Smoking Cessation  Clinical staff conducted group or individual video education with verbal and written material and guidebook.  Patient learns that cigarette smoking and tobacco addiction pose Vanessa Waller serious health risk which affects millions of people. Stopping smoking will significantly reduce the risk of heart disease, lung disease, and many forms of cancer. Recommended strategies for quitting are covered, including working with your doctor to develop Vanessa Waller successful plan.  Culinary   Becoming Vanessa Waller Financial trader conducted group or individual video education with verbal and written material and guidebook.  Patient learns that cooking at home can be healthy, cost-effective, quick, and puts them in control. Keys to cooking healthy recipes will include looking at your recipe, assessing your equipment needs, planning ahead, making it simple, choosing cost-effective seasonal ingredients, and limiting the use of added fats, salts, and sugars.  Cooking -  Breakfast and Snacks  Clinical staff conducted group or individual video education with verbal and written material and guidebook.  Patient learns how important breakfast is to satiety and nutrition through the entire day. Recommendations include key foods to eat during breakfast to help stabilize blood sugar levels and to prevent overeating at meals later in the day. Planning ahead is also Vanessa Waller key component.  Cooking - Human resources officer conducted group or individual video education with verbal and written material and guidebook.  Patient learns eating strategies to improve overall health, including an approach to cook more at home. Recommendations include thinking of animal protein as Vanessa Waller side on your plate rather than center stage and focusing instead on lower calorie dense options like vegetables, fruits, whole grains, and plant-based proteins, such as beans. Making sauces in large quantities to freeze for later and leaving the skin on your vegetables are also recommended to maximize your experience.  Cooking - Healthy Salads and Dressing Clinical staff conducted group or individual video education with verbal and written material and guidebook.  Patient learns that vegetables, fruits, whole grains, and legumes are the foundations of the [place]. Recommendations include how to incorporate each of these in flavorful and healthy salads, and how to create homemade salad dressings. Proper handling of ingredients is also covered. Cooking - Soups and [employer] - Soups and Desserts Clinical staff conducted group or individual video education with verbal and written material and guidebook.  Patient learns that Pritikin soups and desserts make for easy, nutritious, and delicious snacks and meal components that are low in sodium, fat, sugar, and calorie density, while high in vitamins, minerals, and filling fiber. Recommendations include simple and healthy ideas for soups and  desserts.   Overview     The Pritikin  Solution Program Overview Clinical staff conducted group or individual video education with verbal and written material and guidebook.  Patient learns that the results of the So-Hi Program have been documented in more than 100 articles published in peer-reviewed journals, and the benefits include reducing risk factors for (and, in some cases, even reversing) high cholesterol, high blood pressure, type 2 diabetes, obesity, and more! An overview of the three key pillars of the Pritikin Program will be covered: eating well, doing regular exercise, and having Vanessa Waller healthy mind-set.  WORKSHOPS  Exercise: Exercise Basics: Building Your Action Plan Clinical staff led group instruction and group discussion with PowerPoint presentation and patient guidebook. To enhance the learning environment the use of posters, models and videos may be added. At the conclusion of this workshop, patients will comprehend the difference between physical activity and exercise, as well as the benefits of incorporating both, into their routine. Patients will understand the FITT (Frequency, Intensity, Time, and Type) principle and how to use it to build an exercise action plan. In addition, safety concerns and other considerations for exercise and cardiac rehab will be addressed by the presenter. The purpose of this lesson is to promote Vanessa Waller comprehensive and effective weekly exercise routine in order to improve patients' overall level of fitness.   Managing Heart Disease: Your Path to Vanessa Waller Healthier Heart Clinical staff led group instruction and group discussion with PowerPoint presentation and patient guidebook. To enhance the learning environment the use of posters, models and videos may be added.At the conclusion of this workshop, patients will understand the anatomy and physiology of the heart. Additionally, they will understand how Pritikin's three pillars impact the risk factors, the  progression, and the management of heart disease.  The purpose of this lesson is to provide Vanessa Waller high-level overview of the heart, heart disease, and how the Pritikin lifestyle positively impacts risk factors.  Exercise Biomechanics Clinical staff led group instruction and group discussion with PowerPoint presentation and patient guidebook. To enhance the learning environment the use of posters, models and videos may be added. Patients will learn how the structural parts of their bodies function and how these functions impact their daily activities, movement, and exercise. Patients will learn how to promote Vanessa Waller neutral spine, learn how to manage pain, and identify ways to improve their physical movement in order to promote healthy living. The purpose of this lesson is to expose patients to common physical limitations that impact physical activity. Participants will learn practical ways to adapt and manage aches and pains, and to minimize their effect on regular exercise. Patients will learn how to maintain good posture while sitting, walking, and lifting.  Balance Training and Fall Prevention  Clinical staff led group instruction and group discussion with PowerPoint presentation and patient guidebook. To enhance the learning environment the use of posters, models and videos may be added. At the conclusion of this workshop, patients will understand the importance of their sensorimotor skills (vision, proprioception, and the vestibular system) in maintaining their ability to balance as they age. Patients will apply Vanessa Waller variety of balancing exercises that are appropriate for their current level of function. Patients will understand the common causes for poor balance, possible solutions to these problems, and ways to modify their physical environment in order to minimize their fall risk. The purpose of this lesson is to teach patients about the importance of maintaining balance as they age and ways to  minimize their risk of falling.  WORKSHOPS   Nutrition:  Fueling Vanessa Waller Healthy  Body Clinical staff led group instruction and group discussion with PowerPoint presentation and patient guidebook. To enhance the learning environment the use of posters, models and videos may be added. Patients will review the foundational principles of the [place] and understand what constitutes Vanessa Waller serving size in each of the food groups. Patients will also learn Pritikin-friendly foods that are better choices when away from home and review make-ahead meal and snack options. Calorie density will be reviewed and applied to three nutrition priorities: weight maintenance, weight loss, and weight gain. The purpose of this lesson is to reinforce (in Vanessa Waller group setting) the key concepts around what patients are recommended to eat and how to apply these guidelines when away from home by planning and selecting Pritikin-friendly options. Patients will understand how calorie density may be adjusted for different weight management goals.  Mindful Eating  Clinical staff led group instruction and group discussion with PowerPoint presentation and patient guidebook. To enhance the learning environment the use of posters, models and videos may be added. Patients will briefly review the concepts of the [place] and the importance of low-calorie dense foods. The concept of mindful eating will be introduced as well as the importance of paying attention to internal hunger signals. Triggers for non-hunger eating and techniques for dealing with triggers will be explored. The purpose of this lesson is to provide patients with the opportunity to review the basic principles of the [place], discuss the value of eating mindfully and how to measure internal cues of hunger and fullness using the Hunger Scale. Patients will also discuss reasons for non-hunger eating and learn strategies to use for controlling emotional  eating.  Targeting Your Nutrition Priorities Clinical staff led group instruction and group discussion with PowerPoint presentation and patient guidebook. To enhance the learning environment the use of posters, models and videos may be added. Patients will learn how to determine their genetic susceptibility to disease by reviewing their family history. Patients will gain insight into the importance of diet as part of an overall healthy lifestyle in mitigating the impact of genetics and other environmental insults. The purpose of this lesson is to provide patients with the opportunity to assess their personal nutrition priorities by looking at their family history, their own health history and current risk factors. Patients will also be able to discuss ways of prioritizing and modifying the [place] for their highest risk areas  Menu  Clinical staff led group instruction and group discussion with PowerPoint presentation and patient guidebook. To enhance the learning environment the use of posters, models and videos may be added. Using menus brought in from [employer], or printed from [employer], patients will apply the [place] dining out guidelines that were presented in the [employer] video. Patients will also be able to practice these guidelines in Vanessa Waller variety of provided scenarios. The purpose of this lesson is to provide patients with the opportunity to practice hands-on learning of the [place] with actual menus and practice scenarios.  Label Reading Clinical staff led group instruction and group discussion with PowerPoint presentation and patient guidebook. To enhance the learning environment the use of posters, models and videos may be added. Patients will review and discuss the Pritikin label reading guidelines presented in Pritikin's Label Reading Educational series video. Using fool labels brought in from local grocery stores and  markets, patients will apply the label reading guidelines and determine if the packaged food meet the  Pritikin guidelines. The purpose of this lesson is to provide patients with the opportunity to review, discuss, and practice hands-on learning of the Pritikin Label Reading guidelines with actual packaged food labels. Cooking School  Pritikin's [employer] are designed to teach patients ways to prepare quick, simple, and affordable recipes at home. The importance of nutrition's role in chronic disease risk reduction is reflected in its emphasis in the overall Pritikin program. By learning how to prepare essential core Pritikin Eating Plan recipes, patients will increase control over what they eat; be able to customize the flavor of foods without the use of added salt, sugar, or fat; and improve the quality of the food they consume. By learning Vanessa Waller set of core recipes which are easily assembled, quickly prepared, and affordable, patients are more likely to prepare more healthy foods at home. These workshops focus on convenient breakfasts, simple entres, side dishes, and desserts which can be prepared with minimal effort and are consistent with nutrition recommendations for cardiovascular risk reduction. Cooking [employer] are taught by Vanessa Waller Armed forces logistics/support/administrative officer (RD) who has been trained by the [employer]. The chef or RD has Vanessa Waller clear understanding of the importance of minimizing - if not completely eliminating - added fat, sugar, and sodium in recipes. Throughout the series of Cooking School Workshop sessions, patients will learn about healthy ingredients and efficient methods of cooking to build confidence in their capability to prepare    Cooking School weekly topics:  Adding Flavor- Sodium-Free  Fast and Healthy Breakfasts  Powerhouse Plant-Based Proteins  Satisfying Salads and Dressings  Simple Sides and Sauces  International Cuisine-Spotlight on the [employer]  Zones  Delicious Desserts  Savory Soups  [employer] - Meals in Vanessa Waller Astronomer Appetizers and Snacks  Comforting Weekend Breakfasts  One-Pot Wonders   Fast Evening Meals  Landscape architect Your Pritikin Plate  WORKSHOPS   Healthy Mindset (Psychosocial):  Focused Goals, Sustainable Changes Clinical staff led group instruction and group discussion with PowerPoint presentation and patient guidebook. To enhance the learning environment the use of posters, models and videos may be added. Patients will be able to apply effective goal setting strategies to establish at least one personal goal, and then take consistent, meaningful action toward that goal. They will learn to identify common barriers to achieving personal goals and develop strategies to overcome them. Patients will also gain an understanding of how our mind-set can impact our ability to achieve goals and the importance of cultivating Vanessa Waller positive and growth-oriented mind-set. The purpose of this lesson is to provide patients with Vanessa Waller deeper understanding of how to set and achieve personal goals, as well as the tools and strategies needed to overcome common obstacles which may arise along the way.  From Head to Heart: The Power of Vanessa Waller Healthy Outlook  Clinical staff led group instruction and group discussion with PowerPoint presentation and patient guidebook. To enhance the learning environment the use of posters, models and videos may be added. Patients will be able to recognize and describe the impact of emotions and mood on physical health. They will discover the importance of self-care and explore self-care practices which may work for them. Patients will also learn how to utilize the 4 C's to cultivate Vanessa Waller healthier outlook and better manage stress and challenges. The purpose of this lesson is to demonstrate to patients how Vanessa Waller healthy outlook is an essential part of maintaining good health, especially as they continue their  cardiac  rehab journey.  Healthy Sleep for Vanessa Waller Healthy Heart Clinical staff led group instruction and group discussion with PowerPoint presentation and patient guidebook. To enhance the learning environment the use of posters, models and videos may be added. At the conclusion of this workshop, patients will be able to demonstrate knowledge of the importance of sleep to overall health, well-being, and quality of life. They will understand the symptoms of, and treatments for, common sleep disorders. Patients will also be able to identify daytime and nighttime behaviors which impact sleep, and they will be able to apply these tools to help manage sleep-related challenges. The purpose of this lesson is to provide patients with Vanessa Waller general overview of sleep and outline the importance of quality sleep. Patients will learn about Vanessa Waller few of the most common sleep disorders. Patients will also be introduced to the concept of "sleep hygiene," and discover ways to self-manage certain sleeping problems through simple daily behavior changes. Finally, the workshop will motivate patients by clarifying the links between quality sleep and their goals of heart-healthy living.   Recognizing and Reducing Stress Clinical staff led group instruction and group discussion with PowerPoint presentation and patient guidebook. To enhance the learning environment the use of posters, models and videos may be added. At the conclusion of this workshop, patients will be able to understand the types of stress reactions, differentiate between acute and chronic stress, and recognize the impact that chronic stress has on their health. They will also be able to apply different coping mechanisms, such as reframing negative self-talk. Patients will have the opportunity to practice Vanessa Waller variety of stress management techniques, such as deep abdominal breathing, progressive muscle relaxation, and/or guided imagery.  The purpose of this lesson is to educate  patients on the role of stress in their lives and to provide healthy techniques for coping with it.  Learning Barriers/Preferences:  Learning Barriers/Preferences - 09/19/22 1433       Learning Barriers/Preferences   Learning Barriers Sight   wears glasses   Learning Preferences Verbal Instruction;Computer/Internet;Audio;Written Material             Education Topics:  Knowledge Questionnaire Score:  Knowledge Questionnaire Score - 09/19/22 1433       Knowledge Questionnaire Score   Pre Score 20/24             Core Components/Risk Factors/Patient Goals at Admission:  Personal Goals and Risk Factors at Admission - 09/19/22 1434       Core Components/Risk Factors/Patient Goals on Admission    Weight Management Weight Gain             Core Components/Risk Factors/Patient Goals Review:   Goals and Risk Factor Review     Row Name 09/23/22 1625 10/02/22 1207 10/24/22 1653         Core Components/Risk Factors/Patient Goals Review   Personal Goals Review Weight Management/Obesity Weight Management/Obesity Weight Management/Obesity     Review Vanessa Waller started intensive cardiac rehab on 09/23/22 and did well with exercise for her fitness level. Vital signs stable. Vanessa Waller did dicuss weight gain with RD Sam today Vanessa Waller started intensive cardiac rehab on 09/23/22 and is off to Vanessa Waller good start to exercise.. Vital signs have been  stable. Vanessa Waller is doing well with exercise at  intensive cardiac rehab.. Vital signs have been  stable.     Expected Outcomes Vanessa Waller will continue to participate in intensive cardiac rehab for exercise, nutrition and lifesyle modifications Vanessa Waller will continue to participate in intensive cardiac rehab for  exercise, nutrition and lifesyle modifications Vanessa Waller will continue to participate in intensive cardiac rehab for exercise, nutrition and lifesyle modifications              Core Components/Risk Factors/Patient Goals at Discharge (Final Review):    Goals and Risk Factor Review - 10/24/22 1653       Core Components/Risk Factors/Patient Goals Review   Personal Goals Review Weight Management/Obesity    Review Vanessa Waller is doing well with exercise at  intensive cardiac rehab.. Vital signs have been  stable.    Expected Outcomes Vanessa Waller will continue to participate in intensive cardiac rehab for exercise, nutrition and lifesyle modifications             ITP Comments:  ITP Comments     Row Name 09/19/22 1121 09/23/22 1623 10/02/22 1201 10/24/22 1651     ITP Comments Dr. [name] medical director. Introduction to pritikin education program/ intensive cardiac rehab. Initial orientation packet reviewed with patient. 30 Day ITP Review. Vanessa Waller started intensive cardiac rehab on 09/23/22 and did well with exercise for her fitness level. Vanessa Waller is somewhat deconditioned 30 Day ITP Review. Vanessa Waller started intensive cardiac rehab on 09/23/22 and is off to Vanessa Waller good start to exercise 30 Day ITP Review. Vanessa Waller has good attendance and participation in  intensive cardiac rehab             Comments: See ITP comments.

## 2022-11-20 ENCOUNTER — Encounter (HOSPITAL_COMMUNITY)
Admission: RE | Admit: 2022-11-20 | Discharge: 2022-11-20 | Disposition: A | Discharge status: Home / Self Care | Payer: Medicare Other | Source: Ambulatory Visit | Attending: Cardiology | Admitting: Cardiology

## 2022-11-20 DIAGNOSIS — Z954 Presence of other heart-valve replacement: Secondary | ICD-10-CM

## 2022-11-20 DIAGNOSIS — Z952 Presence of prosthetic heart valve: Secondary | ICD-10-CM | POA: Diagnosis not present

## 2022-11-22 ENCOUNTER — Encounter (HOSPITAL_COMMUNITY)
Admission: RE | Admit: 2022-11-22 | Discharge: 2022-11-22 | Disposition: A | Discharge status: Home / Self Care | Payer: Medicare Other | Source: Ambulatory Visit | Attending: Cardiology | Admitting: Cardiology

## 2022-11-22 DIAGNOSIS — Z952 Presence of prosthetic heart valve: Secondary | ICD-10-CM | POA: Diagnosis not present

## 2022-11-22 DIAGNOSIS — Z954 Presence of other heart-valve replacement: Secondary | ICD-10-CM

## 2022-11-25 ENCOUNTER — Encounter (HOSPITAL_COMMUNITY)
Admission: RE | Admit: 2022-11-25 | Discharge: 2022-11-25 | Disposition: A | Discharge status: Home / Self Care | Payer: Medicare Other | Source: Ambulatory Visit | Attending: Cardiology | Admitting: Cardiology

## 2022-11-25 VITALS — Ht 62.5 in | Wt 105.2 lb

## 2022-11-25 DIAGNOSIS — Z954 Presence of other heart-valve replacement: Secondary | ICD-10-CM

## 2022-11-25 DIAGNOSIS — Z952 Presence of prosthetic heart valve: Secondary | ICD-10-CM

## 2022-11-27 ENCOUNTER — Encounter (HOSPITAL_COMMUNITY)
Admission: RE | Admit: 2022-11-27 | Discharge: 2022-11-27 | Disposition: A | Discharge status: Home / Self Care | Payer: Medicare Other | Source: Ambulatory Visit | Attending: Cardiology | Admitting: Cardiology

## 2022-11-27 DIAGNOSIS — Z954 Presence of other heart-valve replacement: Secondary | ICD-10-CM

## 2022-11-27 DIAGNOSIS — Z952 Presence of prosthetic heart valve: Secondary | ICD-10-CM

## 2022-11-27 NOTE — Progress Notes (Signed)
Discharge Progress Report  Patient Details  Name: Vanessa Waller MRN: [id number] Date of Birth: [birth year] Referring Provider:   Flowsheet Row INTENSIVE CARDIAC REHAB ORIENT from 09/19/2022 in [facility]  Referring Provider Dr. Regino Schultze (Vanessa Magic, MD covering)        Number of Visits: 38  Reason for Discharge:  Patient reached a stable level of exercise. Patient independent in their exercise. Patient has met program and personal goals.  Smoking History:  Social History   Tobacco Use  Smoking Status Never  Smokeless Tobacco Never    Diagnosis:  06/27/22 S/P pulmonary valve replacement  06/27/22 S/P tricuspid valve replacement, S/P aortic valve replacement  ADL UCSD:   Initial Exercise Prescription:  Initial Exercise Prescription - 09/19/22 1400       Date of Initial Exercise RX and Referring Provider   Date 09/19/22    Referring Provider Dr. Regino Schultze Vanessa Magic, MD covering)    Expected Discharge Date 11/29/22      Recumbant Bike   Level 1.5    RPM 60    Watts 25    Minutes 15    METs 3.6      NuStep   Level 2    SPM 75    Minutes 15    METs 3.6      Prescription Details   Frequency (times per week) 3    Duration Progress to 30 minutes of continuous aerobic without signs/symptoms of physical distress      Intensity   THRR 40-80% of Max Heartrate 62 -124    Ratings of Perceived Exertion 11-13    Perceived Dyspnea 0-4      Progression   Progression Continue progressive overload as per policy without signs/symptoms or physical distress.      Resistance Training   Training Prescription Yes    Weight 2 lbs    Reps 10-15             Discharge Exercise Prescription (Final Exercise Prescription Changes):  Exercise Prescription Changes - 11/29/22 1400       Response to Exercise   Blood Pressure (Admit) 138/70    Blood Pressure (Exercise) 140/82    Blood Pressure (Exit) 120/78     Heart Rate (Admit) 82 bpm    Heart Rate (Exercise) 87 bpm    Heart Rate (Exit) 75 bpm    Rating of Perceived Exertion (Exercise) 11    Symptoms None    Comments Pt graduated from the CRP2 program    Duration Continue with 30 min of aerobic exercise without signs/symptoms of physical distress.    Intensity THRR unchanged      Progression   Progression Continue to progress workloads to maintain intensity without signs/symptoms of physical distress.    Average METs 2.3      Resistance Training   Training Prescription Yes    Weight 2 lbs    Reps 10-15    Time 10 Minutes      Interval Training   Interval Training No      Recumbant Bike   Level 2    RPM 56    Watts 12    Minutes 15    METs 2.3      NuStep   Level 2    SPM 80    Minutes 15    METs 2.2      Home Exercise Plan   Plans to continue exercise at Home (comment)  Frequency Add 2 additional days to program exercise sessions.    Initial Home Exercises Provided 11/18/22             Functional Capacity:  6 Minute Walk     Row Name 09/19/22 1134 11/25/22 1409       6 Minute Walk   Phase Initial Discharge    Distance 1369 feet 1511 feet    Distance % Change -- 10.37 %    Distance Feet Change -- 142 ft    Walk Time 6 minutes 6 minutes    # of Rest Breaks 0 0    MPH 2.6 2.86    METS 3.63 3.87    RPE 11 12    Perceived Dyspnea  1 0    VO2 Peak 12.72 13.52    Symptoms Yes (comment) Yes (comment)    Comments SOB, RPD = 1 Calf and Quad "burning" 3/10    Resting HR 79 bpm 77 bpm    Resting BP 120/80 120/70    Resting Oxygen Saturation  100 % --    Exercise Oxygen Saturation  during 6 min walk 99 % --    Max Ex. HR 101 bpm 97 bpm    Max Ex. BP 128/70 132/68    2 Minute Post BP 122/78 --             Psychological, QOL, Others - Outcomes: PHQ 2/9:    11/29/2022    5:04 PM 09/19/2022   11:48 AM  Depression screen PHQ 2/9  Decreased Interest 0 0  Down, Depressed, Hopeless 0 0  PHQ - 2 Score 0 0   Altered sleeping 0 0  Tired, decreased energy 0 0  Change in appetite 0 0  Feeling bad or failure about yourself  0 0  Trouble concentrating 0 0  Moving slowly or fidgety/restless 0 0  Suicidal thoughts 0 0  PHQ-9 Score 0 0  Difficult doing work/chores Not difficult at all Not difficult at all    Quality of Life:  Quality of Life - 11/25/22 1500       Quality of Life Scores   Health/Function Pre 20.43 %    Health/Function Post 22.71 %    Health/Function % Change 11.16 %    Socioeconomic Pre 20.36 %    Socioeconomic Post 20.71 %    Socioeconomic % Change  1.72 %    Psych/Spiritual Pre 22.5 %    Psych/Spiritual Post 22.5 %    Psych/Spiritual % Change 0 %    Family Pre 19.17 %    Family Post 20 %    Family % Change 4.33 %    GLOBAL Pre 20.69 %    GLOBAL Post 21.87 %    GLOBAL % Change 5.7 %             Personal Goals: Goals established at orientation with interventions provided to work toward goal.  Personal Goals and Risk Factors at Admission - 09/19/22 1434       Core Components/Risk Factors/Patient Goals on Admission    Weight Management Weight Gain              Personal Goals Discharge:  Goals and Risk Factor Review     Row Name 09/23/22 1625 10/02/22 1207 10/24/22 1653 11/19/22 1650       Core Components/Risk Factors/Patient Goals Review   Personal Goals Review Weight Management/Obesity Weight Management/Obesity Weight Management/Obesity Weight Management/Obesity    Review Vanessa Waller started intensive cardiac rehab on  09/23/22 and did well with exercise for her fitness level. Vital signs stable. Vanessa Waller did dicuss weight gain with RD Sam today Vanessa Waller started intensive cardiac rehab on 09/23/22 and is off to a good start to exercise.. Vital signs have been  stable. Vanessa Waller is doing well with exercise at  intensive cardiac rehab.. Vital signs have been  stable. Vanessa Waller is doing well with exercise at  intensive cardiac rehab.. Vital signs and weight  remain   stable. Vanessa Waller will complete intensive cardiac rehab on 11/29/22    Expected Outcomes Vanessa Waller will continue to participate in intensive cardiac rehab for exercise, nutrition and lifesyle modifications Vanessa Waller will continue to participate in intensive cardiac rehab for exercise, nutrition and lifesyle modifications Vanessa Waller will continue to participate in intensive cardiac rehab for exercise, nutrition and lifesyle modifications Dura will continue to participate in intensive cardiac rehab for exercise, nutrition and lifesyle modifications             Exercise Goals and Review:  Exercise Goals     Row Name 09/19/22 1441             Exercise Goals   Increase Physical Activity Yes       Intervention Provide advice, education, support and counseling about physical activity/exercise needs.;Develop an individualized exercise prescription for aerobic and resistive training based on initial evaluation findings, risk stratification, comorbidities and participant's personal goals.       Expected Outcomes Short Term: Attend rehab on a regular basis to increase amount of physical activity.;Long Term: Add in home exercise to make exercise part of routine and to increase amount of physical activity.;Long Term: Exercising regularly at least 3-5 days a week.       Increase Strength and Stamina Yes       Intervention Provide advice, education, support and counseling about physical activity/exercise needs.;Develop an individualized exercise prescription for aerobic and resistive training based on initial evaluation findings, risk stratification, comorbidities and participant's personal goals.       Expected Outcomes Short Term: Increase workloads from initial exercise prescription for resistance, speed, and METs.;Short Term: Perform resistance training exercises routinely during rehab and add in resistance training at home;Long Term: Improve cardiorespiratory fitness, muscular endurance and strength as measured  by increased METs and functional capacity ( )       Able to understand and use rate of perceived exertion (RPE) scale Yes       Intervention Provide education and explanation on how to use RPE scale       Expected Outcomes Short Term: Able to use RPE daily in rehab to express subjective intensity level;Long Term:  Able to use RPE to guide intensity level when exercising independently       Knowledge and understanding of Target Heart Rate Range (THRR) Yes       Intervention Provide education and explanation of THRR including how the numbers were predicted and where they are located for reference       Expected Outcomes Short Term: Able to state/look up THRR;Short Term: Able to use daily as guideline for intensity in rehab;Long Term: Able to use THRR to govern intensity when exercising independently       Understanding of Exercise Prescription Yes       Intervention Provide education, explanation, and written materials on patient's individual exercise prescription       Expected Outcomes Short Term: Able to explain program exercise prescription;Long Term: Able to explain home exercise prescription to exercise independently  Exercise Goals Re-Evaluation:  Exercise Goals Re-Evaluation     Row Name 09/23/22 1412 10/23/22 1500 11/20/22 1419 11/29/22 1441       Exercise Goal Re-Evaluation   Exercise Goals Review Increase Physical Activity;Increase Strength and Stamina;Able to understand and use rate of perceived exertion (RPE) scale;Knowledge and understanding of Target Heart Rate Range (THRR);Understanding of Exercise Prescription Increase Physical Activity;Increase Strength and Stamina;Able to understand and use rate of perceived exertion (RPE) scale;Knowledge and understanding of Target Heart Rate Range (THRR);Understanding of Exercise Prescription Increase Physical Activity;Increase Strength and Stamina;Able to understand and use rate of perceived exertion (RPE) scale;Knowledge  and understanding of Target Heart Rate Range (THRR);Understanding of Exercise Prescription Increase Physical Activity;Increase Strength and Stamina;Able to understand and use rate of perceived exertion (RPE) scale;Knowledge and understanding of Target Heart Rate Range (THRR);Understanding of Exercise Prescription    Comments Pt's first day in the CRP2 program. Pt understands the exercise Rx, THRR, RPE scale. Reviewed METs and goals. Pt is making slow, steady progress. Pt voices that she has increased her strength and stamina since starting the program. Reviewed METs and goals. Pt had goal to gain weight. Pt has not gained any significant weight but is maintaining. Pt voices she has seen imrpovement in her strength and stamina. Pt also had goal to be less sedentary. She has some improvement in this by attending the CRP2 class 3x/week, but is not doing much on off days. Recently discussed walking at home 2x/week with her sister. Pt graduated from the CRP2 program today. Pt palns to walk with her sister when she completes the program. Encouraged 5-7x/week for 30-45 minutes.    Expected Outcomes Will continue to monitor the patient and progress exercise workloads as tolerated. Will continue to monitor the patient and progress exercise workloads as tolerated. Will continue to monitor the patient and progress exercise workloads as tolerated. Will continue to monitor the patient and progress exercise workloads as tolerated.             Nutrition & Weight - Outcomes:  Pre Biometrics - 09/19/22 1045       Pre Biometrics   Waist Circumference 29.5 inches    Hip Circumference 35.5 inches    Waist to Hip Ratio 0.83 %    Triceps Skinfold 10 mm    % Body Fat 27.2 %    Grip Strength 22 kg    Flexibility 0 in   could not reach box   Single Leg Stand 30 seconds             Post Biometrics - 11/25/22 1415        Post  Biometrics   Height 5' 2.5" (1.588 m)    Weight 47.7 kg    Waist Circumference  30.5 inches   +1in   Hip Circumference 36 inches   +0.5in   Waist to Hip Ratio 0.85 %    BMI (Calculated) 18.92    Triceps Skinfold 11 mm   +48mm   % Body Fat 28.1 %   +3.31%   Grip Strength 25 kg   +13.64%   Flexibility 0 in   cannot reach   Single Leg Stand 30 seconds             Nutrition:  Nutrition Therapy & Goals - 11/18/22 1400       Nutrition Therapy   Diet General Healthful Diet    Protein (specify units) 62-77g/day (1.2-1.5g/kg IBW)      Personal Nutrition Goals   Nutrition  Goal Patient to identify strategies for weight gain of 0.5-2.0# per week.    Personal Goal #2 Patient to improve diet quality by using the plate method as a guide for meal planning to include lean protein/plant protein, fruits, vegetables, whole grains, nonfat dairy as part of a well-balanced diet.    Comments Goals in action. Keltie is motivated to gain weight. She reports her lowest weight was ~90# and she is uncertain of a goal weight at this time. She is no longer living with her sister/brother in law but on her own. They do continue to support her and grocery shop for her. She reports normal appetite, eating three meals daily, and using Equate Nutritional Shake Plus on occasion (350kcals, 13g protein). Encouraged patient to increased Equate Nutritional Shake Plus to twice daily (700kcals, 26g protein) in additon to three meals and reviewed strategies for weight gain including increasing eating frequency and increasing carbohydrate portions. She has maintained her weight since starting with our program. Avarose will continue to benefit from participation in intensive cardiac rehab for nutrition, exercise, and lifestyle modification.      Intervention Plan   Intervention Prescribe, educate and counsel regarding individualized specific dietary modifications aiming towards targeted core components such as weight, hypertension, lipid management, diabetes, heart failure and other comorbidities.;Nutrition  handout(s) given to patient.    Expected Outcomes Short Term Goal: Understand basic principles of dietary content, such as calories, fat, sodium, cholesterol and nutrients.;Long Term Goal: Adherence to prescribed nutrition plan.             Nutrition Discharge:  Nutrition Assessments - 12/02/22 0857       Rate Your Plate Scores   Post Score 57             Education Questionnaire Score:  Knowledge Questionnaire Score - 11/25/22 1500       Knowledge Questionnaire Score   Post Score 24/24             Goals reviewed with patient; copy given to patient.Pt graduates from  Intensive/Traditional cardiac rehab program on 11/27/22  with completion of  24 exercise and  33 education sessions. Pt maintained good attendance and progressed nicely during their participation in rehab as evidenced by increased MET level.   Medication list reconciled. Repeat  PHQ score- 0 .  Pt has made significant lifestyle changes and should be commended for their success. Valkyrie  achieved their goals during cardiac rehab.   Pt plans to continue exercise at home 2-3 days a week walking with her siblings in the neighborhood and the park. Ashelynn increased her distance on her post exercise walk test by 142 feet. We are proud of Jannelle's progress!Thayer Headings RN BSN

## 2022-11-29 ENCOUNTER — Encounter (HOSPITAL_COMMUNITY)
Admission: RE | Admit: 2022-11-29 | Discharge: 2022-11-29 | Disposition: A | Discharge status: Home / Self Care | Payer: Medicare Other | Source: Ambulatory Visit | Attending: Cardiology | Admitting: Cardiology

## 2022-11-29 DIAGNOSIS — Z954 Presence of other heart-valve replacement: Secondary | ICD-10-CM

## 2022-11-29 DIAGNOSIS — Z952 Presence of prosthetic heart valve: Secondary | ICD-10-CM

## 2023-07-16 HISTORY — PX: STENT PLACEMENT RT URETER (ARMC HX): HXRAD1255

## 2023-11-19 ENCOUNTER — Other Ambulatory Visit: Payer: Self-pay

## 2023-11-19 ENCOUNTER — Encounter (HOSPITAL_BASED_OUTPATIENT_CLINIC_OR_DEPARTMENT_OTHER): Payer: Self-pay

## 2023-11-19 ENCOUNTER — Encounter: Payer: Self-pay | Admitting: Oncology

## 2023-11-19 ENCOUNTER — Emergency Department (HOSPITAL_BASED_OUTPATIENT_CLINIC_OR_DEPARTMENT_OTHER)
Admission: EM | Admit: 2023-11-19 | Discharge: 2023-11-19 | Disposition: A | Discharge status: Home / Self Care | Attending: Emergency Medicine | Admitting: Emergency Medicine

## 2023-11-19 DIAGNOSIS — Z8589 Personal history of malignant neoplasm of other organs and systems: Secondary | ICD-10-CM | POA: Insufficient documentation

## 2023-11-19 DIAGNOSIS — R1030 Lower abdominal pain, unspecified: Secondary | ICD-10-CM | POA: Insufficient documentation

## 2023-11-19 DIAGNOSIS — M546 Pain in thoracic spine: Secondary | ICD-10-CM | POA: Diagnosis present

## 2023-11-19 LAB — CBC WITH DIFFERENTIAL/PLATELET
Abs Immature Granulocytes: 0.02 10*3/uL (ref 0.00–0.07)
Basophils Absolute: 0 10*3/uL (ref 0.0–0.1)
Basophils Relative: 1 %
Eosinophils Absolute: 0.1 10*3/uL (ref 0.0–0.5)
Eosinophils Relative: 1 %
HCT: 43.1 % (ref 36.0–46.0)
Hemoglobin: 13.5 g/dL (ref 12.0–15.0)
Immature Granulocytes: 0 %
Lymphocytes Relative: 13 %
Lymphs Abs: 0.9 10*3/uL (ref 0.7–4.0)
MCH: 28.1 pg (ref 26.0–34.0)
MCHC: 31.3 g/dL (ref 30.0–36.0)
MCV: 89.8 fL (ref 80.0–100.0)
Monocytes Absolute: 0.3 10*3/uL (ref 0.1–1.0)
Monocytes Relative: 4 %
Neutro Abs: 5.7 10*3/uL (ref 1.7–7.7)
Neutrophils Relative %: 81 %
Platelets: 246 10*3/uL (ref 150–400)
RBC: 4.8 MIL/uL (ref 3.87–5.11)
RDW: 14.2 % (ref 11.5–15.5)
WBC: 7 10*3/uL (ref 4.0–10.5)
nRBC: 0 % (ref 0.0–0.2)

## 2023-11-19 LAB — COMPREHENSIVE METABOLIC PANEL WITH GFR
ALT: 13 U/L (ref 0–44)
AST: 39 U/L (ref 15–41)
Albumin: 4.6 g/dL (ref 3.5–5.0)
Alkaline Phosphatase: 139 U/L — ABNORMAL HIGH (ref 38–126)
Anion gap: 16 — ABNORMAL HIGH (ref 5–15)
BUN: 20 mg/dL (ref 8–23)
CO2: 23 mmol/L (ref 22–32)
Calcium: 9.8 mg/dL (ref 8.9–10.3)
Chloride: 101 mmol/L (ref 98–111)
Creatinine, Ser: 1.07 mg/dL — ABNORMAL HIGH (ref 0.44–1.00)
GFR, Estimated: 57 mL/min — ABNORMAL LOW (ref >60)
Glucose, Bld: 132 mg/dL — ABNORMAL HIGH (ref 70–99)
Potassium: 3.8 mmol/L (ref 3.5–5.1)
Sodium: 140 mmol/L (ref 135–145)
Total Bilirubin: 0.3 mg/dL (ref 0.0–1.2)
Total Protein: 8.7 g/dL — ABNORMAL HIGH (ref 6.5–8.1)

## 2023-11-19 MED ORDER — HYDROCODONE-ACETAMINOPHEN 5-325 MG PO TABS
0.5000 | ORAL_TABLET | Freq: Once | ORAL | Status: AC
  Administered 2023-11-19: 0.5 via ORAL
  Filled 2023-11-19: qty 1

## 2023-11-19 MED ORDER — HYDROCODONE-ACETAMINOPHEN 5-325 MG PO TABS: 0.5000 | ORAL_TABLET | Freq: Four times a day (QID) | ORAL | 0 refills | Status: AC | PRN

## 2023-11-19 NOTE — ED Provider Notes (Signed)
 [place] [facility]  Provider Note  CSN: [id number] Arrival date & time: 11/19/23 0000  History Chief Complaint  Patient presents with   Back Pain    Vanessa Waller is a 68 y.o. 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 She reports one day of mid back pain, some R sided abdominal pain with it. She thinks she may have overdone things with lifting her laundry today. She wanted to get checked out due to her history. No SOB, fever, N/V/D or dysuria. Pain is mild-moderate and worse with movement.    Home Medications Prior to Admission medications   Medication Sig Start Date End Date Taking? Authorizing Provider  HYDROcodone -acetaminophen  (NORCO/VICODIN) 5-325 MG tablet Take 0.5-1 tablets by mouth every 6 (six) hours as needed for severe pain (pain score 7-10). 11/19/23  Yes Charmayne Cooper, MD  octreotide  (SANDOSTATIN  LAR) 30 MG injection Inject 30 mg into the muscle every 28 (twenty-eight) days.    [provider]     Allergies    Cephalexin and Amoxicillin   Review of Systems   Review of Systems Please see HPI for pertinent positives and negatives  Physical Exam BP (!) 172/94 (BP Location: Left Arm)   Pulse 88   Temp 98.2 F (36.8 C)   Resp (!) 22   Ht 5\' 1"  (1.549 m)   Wt 44.9 kg   SpO2 100%   BMI 18.71 kg/m   Physical Exam Vitals and nursing note reviewed.  Constitutional:      Appearance: Normal appearance.  HENT:     Head: Normocephalic and atraumatic.     Nose: Nose normal.     Mouth/Throat:     Mouth: Mucous membranes are moist.  Eyes:     Extraocular Movements: Extraocular movements intact.     Conjunctiva/sclera: Conjunctivae normal.  Cardiovascular:     Rate and Rhythm: Normal rate.  Pulmonary:     Effort: Pulmonary effort is normal.     Breath sounds: Normal breath sounds.   Abdominal:     General: Abdomen is flat.     Palpations: Abdomen is soft.     Tenderness: There is abdominal tenderness (mild, suprapubic at site of known uterine tumor). There is no guarding.  Musculoskeletal:        General: Tenderness (mild, thoracic paraspinal muscles) present. No swelling. Normal range of motion.     Cervical back: Neck supple.  Skin:    General: Skin is warm and dry.  Neurological:     General: No focal deficit present.     Mental Status: She is alert.  Psychiatric:        Mood and Affect: Mood normal.     ED Results / Procedures / Treatments   EKG None  Procedures Procedures  Medications Ordered in the ED Medications  HYDROcodone -acetaminophen  (NORCO/VICODIN) 5-325 MG per tablet 0.5 tablet (has no administration in time range)    Initial Impression and Plan  Patient here with nonspecific back pain, some abdominal pain as well. Exam and vitals are reassuring. Labs done in triage show normal CBC, CMP without significant abnormalities. She is not having any urinary symptoms, will cancel UA. We discussed CT imaging, but she states she is already scheduled for follow up CT at [facility] in a few days and would like to avoid additional needle sticks tonight. Will give a dose of norco  for pain, Rx for same as needed. Recommend close outpatient follow up at [facility] or [facility] if symptoms worsen.   ED Course       MDM Rules/Calculators/A&P Medical Decision Making Problems Addressed: Acute right-sided thoracic back pain: acute illness or injury  Amount and/or Complexity of Data Reviewed Labs: ordered. Decision-making details documented in ED Course.  Risk Prescription drug management.     Final Clinical Impression(s) / ED Diagnoses Final diagnoses:  Acute right-sided thoracic back pain    Rx / DC Orders ED Discharge Orders          Ordered    HYDROcodone -acetaminophen  (NORCO/VICODIN) 5-325 MG tablet  Every 6 hours PRN        11/19/23 0139              Charmayne Cooper, MD 11/19/23 0140

## 2023-11-19 NOTE — ED Triage Notes (Addendum)
 Pt c/o mid back pain Denies any injury States "I just don't feel right and can not get comfortable" Significant medical hx Per pt has cancer (several sites) and is not in remission Receives immunotherapy every 3 weeks

## 2023-11-19 NOTE — ED Notes (Signed)
 This tech went to remind pt about urine and pt stated they already urinated and forgot to collect specimen. Pt was reminded of urine collection cup and call light if assistance is needed. This tech will check on pt again during rounding
